# Patient Record
Sex: Female | Born: 1935 | Race: White | Hispanic: No | Marital: Married | Smoking: Former smoker
Health system: Southern US, Community
[De-identification: ages and names within clinical notes are randomized; demographics above are authoritative.]

## PROBLEM LIST (undated history)

## (undated) DIAGNOSIS — D4989 Neoplasm of unspecified behavior of other specified sites: Secondary | ICD-10-CM

## (undated) DIAGNOSIS — S32000A Wedge compression fracture of unspecified lumbar vertebra, initial encounter for closed fracture: Secondary | ICD-10-CM

## (undated) DIAGNOSIS — K449 Diaphragmatic hernia without obstruction or gangrene: Secondary | ICD-10-CM

## (undated) DIAGNOSIS — C4490 Unspecified malignant neoplasm of skin, unspecified: Secondary | ICD-10-CM

## (undated) DIAGNOSIS — I340 Nonrheumatic mitral (valve) insufficiency: Secondary | ICD-10-CM

## (undated) DIAGNOSIS — I2510 Atherosclerotic heart disease of native coronary artery without angina pectoris: Secondary | ICD-10-CM

## (undated) DIAGNOSIS — I209 Angina pectoris, unspecified: Secondary | ICD-10-CM

## (undated) DIAGNOSIS — I493 Ventricular premature depolarization: Secondary | ICD-10-CM

## (undated) DIAGNOSIS — I10 Essential (primary) hypertension: Secondary | ICD-10-CM

## (undated) DIAGNOSIS — F0390 Unspecified dementia without behavioral disturbance: Secondary | ICD-10-CM

## (undated) DIAGNOSIS — Z951 Presence of aortocoronary bypass graft: Secondary | ICD-10-CM

## (undated) DIAGNOSIS — R011 Cardiac murmur, unspecified: Secondary | ICD-10-CM

## (undated) DIAGNOSIS — R0602 Shortness of breath: Secondary | ICD-10-CM

## (undated) DIAGNOSIS — Z9889 Other specified postprocedural states: Secondary | ICD-10-CM

## (undated) DIAGNOSIS — I509 Heart failure, unspecified: Secondary | ICD-10-CM

## (undated) DIAGNOSIS — I719 Aortic aneurysm of unspecified site, without rupture: Secondary | ICD-10-CM

## (undated) DIAGNOSIS — E441 Mild protein-calorie malnutrition: Secondary | ICD-10-CM

## (undated) DIAGNOSIS — I341 Nonrheumatic mitral (valve) prolapse: Secondary | ICD-10-CM

## (undated) DIAGNOSIS — I700 Atherosclerosis of aorta: Secondary | ICD-10-CM

## (undated) DIAGNOSIS — S22000A Wedge compression fracture of unspecified thoracic vertebra, initial encounter for closed fracture: Secondary | ICD-10-CM

## (undated) HISTORY — DX: Atherosclerosis of aorta: I70.0

## (undated) HISTORY — DX: Atherosclerotic heart disease of native coronary artery without angina pectoris: I25.10

## (undated) HISTORY — DX: Unspecified malignant neoplasm of skin, unspecified: C44.90

## (undated) HISTORY — DX: Mild protein-calorie malnutrition: E44.1

## (undated) HISTORY — DX: Neoplasm of unspecified behavior of other specified sites: D49.89

## (undated) HISTORY — DX: Wedge compression fracture of unspecified thoracic vertebra, initial encounter for closed fracture: S22.000A

## (undated) HISTORY — DX: Heart failure, unspecified: I50.9

## (undated) HISTORY — PX: HEMORROIDECTOMY: SUR656

## (undated) HISTORY — PX: HEMORRHOID SURGERY: SHX153

## (undated) HISTORY — PX: OTHER SURGICAL HISTORY: SHX169

## (undated) HISTORY — DX: Presence of aortocoronary bypass graft: Z95.1

## (undated) HISTORY — PX: BACK SURGERY: SHX140

## (undated) HISTORY — DX: Nonrheumatic mitral (valve) prolapse: I34.1

## (undated) HISTORY — PX: ABDOMINAL HYSTERECTOMY: SHX81

## (undated) HISTORY — PX: TOTAL HIP ARTHROPLASTY: SHX124

## (undated) HISTORY — DX: Nonrheumatic mitral (valve) insufficiency: I34.0

---

## 1997-08-27 ENCOUNTER — Other Ambulatory Visit: Admission: RE | Admit: 1997-08-27 | Discharge: 1997-08-27 | Payer: Self-pay | Admitting: Gynecology

## 1999-02-09 ENCOUNTER — Encounter: Payer: Self-pay | Admitting: Gynecology

## 1999-02-09 ENCOUNTER — Encounter: Admission: RE | Admit: 1999-02-09 | Discharge: 1999-02-09 | Payer: Self-pay | Admitting: Gynecology

## 1999-03-30 ENCOUNTER — Other Ambulatory Visit: Admission: RE | Admit: 1999-03-30 | Discharge: 1999-03-30 | Payer: Self-pay | Admitting: Gynecology

## 2001-10-25 ENCOUNTER — Encounter: Payer: Self-pay | Admitting: Gynecology

## 2001-10-25 ENCOUNTER — Encounter: Admission: RE | Admit: 2001-10-25 | Discharge: 2001-10-25 | Payer: Self-pay | Admitting: Gynecology

## 2001-10-31 ENCOUNTER — Other Ambulatory Visit: Admission: RE | Admit: 2001-10-31 | Discharge: 2001-10-31 | Payer: Self-pay | Admitting: Gynecology

## 2002-01-02 ENCOUNTER — Emergency Department (HOSPITAL_COMMUNITY): Admission: EM | Admit: 2002-01-02 | Discharge: 2002-01-02 | Payer: Self-pay | Admitting: Emergency Medicine

## 2002-01-02 ENCOUNTER — Encounter: Payer: Self-pay | Admitting: Emergency Medicine

## 2002-08-03 ENCOUNTER — Encounter: Payer: Self-pay | Admitting: Family Medicine

## 2002-08-03 ENCOUNTER — Ambulatory Visit (HOSPITAL_COMMUNITY): Admission: RE | Admit: 2002-08-03 | Discharge: 2002-08-03 | Payer: Self-pay | Admitting: Family Medicine

## 2002-08-13 ENCOUNTER — Ambulatory Visit (HOSPITAL_COMMUNITY): Admission: RE | Admit: 2002-08-13 | Discharge: 2002-08-13 | Payer: Self-pay | Admitting: Family Medicine

## 2002-08-13 ENCOUNTER — Encounter: Payer: Self-pay | Admitting: Family Medicine

## 2003-09-04 ENCOUNTER — Ambulatory Visit: Admission: RE | Admit: 2003-09-04 | Discharge: 2003-09-04 | Payer: Self-pay | Admitting: Family Medicine

## 2006-10-02 ENCOUNTER — Emergency Department (HOSPITAL_COMMUNITY): Admission: EM | Admit: 2006-10-02 | Discharge: 2006-10-02 | Payer: Self-pay | Admitting: Emergency Medicine

## 2007-05-18 ENCOUNTER — Encounter: Admission: RE | Admit: 2007-05-18 | Discharge: 2007-05-18 | Payer: Self-pay | Admitting: Gynecology

## 2010-04-22 ENCOUNTER — Emergency Department (HOSPITAL_COMMUNITY)
Admission: EM | Admit: 2010-04-22 | Discharge: 2010-04-22 | Disposition: A | Discharge status: Home / Self Care | Payer: Medicare Other | Attending: Emergency Medicine | Admitting: Emergency Medicine

## 2010-04-22 DIAGNOSIS — I10 Essential (primary) hypertension: Secondary | ICD-10-CM | POA: Insufficient documentation

## 2010-04-22 DIAGNOSIS — K4090 Unilateral inguinal hernia, without obstruction or gangrene, not specified as recurrent: Secondary | ICD-10-CM | POA: Insufficient documentation

## 2010-09-01 ENCOUNTER — Emergency Department (HOSPITAL_COMMUNITY)
Admission: EM | Admit: 2010-09-01 | Discharge: 2010-09-01 | Disposition: A | Discharge status: Home / Self Care | Payer: Medicare Other | Attending: Emergency Medicine | Admitting: Emergency Medicine

## 2010-09-01 ENCOUNTER — Emergency Department (HOSPITAL_COMMUNITY): Payer: Medicare Other

## 2010-09-01 DIAGNOSIS — W1809XA Striking against other object with subsequent fall, initial encounter: Secondary | ICD-10-CM | POA: Insufficient documentation

## 2010-09-01 DIAGNOSIS — I10 Essential (primary) hypertension: Secondary | ICD-10-CM | POA: Insufficient documentation

## 2010-09-01 DIAGNOSIS — R0602 Shortness of breath: Secondary | ICD-10-CM | POA: Insufficient documentation

## 2010-09-01 DIAGNOSIS — S298XXA Other specified injuries of thorax, initial encounter: Secondary | ICD-10-CM | POA: Insufficient documentation

## 2010-09-01 DIAGNOSIS — K449 Diaphragmatic hernia without obstruction or gangrene: Secondary | ICD-10-CM | POA: Insufficient documentation

## 2010-09-01 DIAGNOSIS — R079 Chest pain, unspecified: Secondary | ICD-10-CM | POA: Insufficient documentation

## 2010-10-25 ENCOUNTER — Emergency Department (HOSPITAL_COMMUNITY)
Admission: EM | Admit: 2010-10-25 | Discharge: 2010-10-25 | Disposition: A | Discharge status: Home / Self Care | Payer: Medicare Other | Attending: Emergency Medicine | Admitting: Emergency Medicine

## 2010-10-25 ENCOUNTER — Emergency Department (HOSPITAL_COMMUNITY)
Admission: EM | Admit: 2010-10-25 | Discharge: 2010-10-26 | Disposition: A | Discharge status: Home / Self Care | Payer: Medicare Other | Attending: Emergency Medicine | Admitting: Emergency Medicine

## 2010-10-25 ENCOUNTER — Emergency Department (HOSPITAL_COMMUNITY): Payer: Medicare Other

## 2010-10-25 DIAGNOSIS — K449 Diaphragmatic hernia without obstruction or gangrene: Secondary | ICD-10-CM | POA: Insufficient documentation

## 2010-10-25 DIAGNOSIS — J449 Chronic obstructive pulmonary disease, unspecified: Secondary | ICD-10-CM | POA: Insufficient documentation

## 2010-10-25 DIAGNOSIS — IMO0001 Reserved for inherently not codable concepts without codable children: Secondary | ICD-10-CM | POA: Insufficient documentation

## 2010-10-25 DIAGNOSIS — Y92009 Unspecified place in unspecified non-institutional (private) residence as the place of occurrence of the external cause: Secondary | ICD-10-CM | POA: Insufficient documentation

## 2010-10-25 DIAGNOSIS — I059 Rheumatic mitral valve disease, unspecified: Secondary | ICD-10-CM | POA: Insufficient documentation

## 2010-10-25 DIAGNOSIS — M549 Dorsalgia, unspecified: Secondary | ICD-10-CM | POA: Insufficient documentation

## 2010-10-25 DIAGNOSIS — X500XXA Overexertion from strenuous movement or load, initial encounter: Secondary | ICD-10-CM | POA: Insufficient documentation

## 2010-10-25 DIAGNOSIS — Y998 Other external cause status: Secondary | ICD-10-CM | POA: Insufficient documentation

## 2010-10-25 DIAGNOSIS — IMO0002 Reserved for concepts with insufficient information to code with codable children: Secondary | ICD-10-CM | POA: Insufficient documentation

## 2010-10-25 DIAGNOSIS — J4489 Other specified chronic obstructive pulmonary disease: Secondary | ICD-10-CM | POA: Insufficient documentation

## 2010-10-25 DIAGNOSIS — R059 Cough, unspecified: Secondary | ICD-10-CM | POA: Insufficient documentation

## 2010-10-25 DIAGNOSIS — I10 Essential (primary) hypertension: Secondary | ICD-10-CM | POA: Insufficient documentation

## 2010-10-25 DIAGNOSIS — Z79899 Other long term (current) drug therapy: Secondary | ICD-10-CM | POA: Insufficient documentation

## 2010-10-25 DIAGNOSIS — R05 Cough: Secondary | ICD-10-CM | POA: Insufficient documentation

## 2010-10-25 LAB — DIFFERENTIAL
Eosinophils Absolute: 0.1 10*3/uL (ref 0.0–0.7)
Eosinophils Relative: 1 % (ref 0–5)
Lymphs Abs: 4.2 10*3/uL — ABNORMAL HIGH (ref 0.7–4.0)
Monocytes Absolute: 0.5 10*3/uL (ref 0.1–1.0)
Monocytes Relative: 6 % (ref 3–12)

## 2010-10-25 LAB — CBC
MCH: 30.2 pg (ref 26.0–34.0)
MCHC: 33.6 g/dL (ref 30.0–36.0)
MCV: 89.8 fL (ref 78.0–100.0)
Platelets: 222 10*3/uL (ref 150–400)
RDW: 13.8 % (ref 11.5–15.5)

## 2010-10-25 LAB — BASIC METABOLIC PANEL
CO2: 27 mEq/L (ref 19–32)
GFR calc non Af Amer: 86 mL/min — ABNORMAL LOW (ref >90)
Glucose, Bld: 96 mg/dL (ref 70–99)
Potassium: 3.4 mEq/L — ABNORMAL LOW (ref 3.5–5.1)
Sodium: 137 mEq/L (ref 135–145)

## 2010-10-25 LAB — POCT I-STAT TROPONIN I: Troponin i, poc: 0.01 ng/mL (ref 0.00–0.08)

## 2010-10-25 LAB — CK TOTAL AND CKMB (NOT AT ARMC): Relative Index: INVALID (ref 0.0–2.5)

## 2010-10-26 ENCOUNTER — Emergency Department (HOSPITAL_COMMUNITY): Payer: Medicare Other

## 2010-10-26 LAB — URINALYSIS, ROUTINE W REFLEX MICROSCOPIC
Hgb urine dipstick: NEGATIVE
Leukocytes, UA: NEGATIVE
Protein, ur: NEGATIVE mg/dL
Specific Gravity, Urine: 1.005 (ref 1.005–1.030)
Urobilinogen, UA: 0.2 mg/dL (ref 0.0–1.0)

## 2010-10-26 LAB — BASIC METABOLIC PANEL
Calcium: 10.8 mg/dL — ABNORMAL HIGH (ref 8.4–10.5)
GFR calc Af Amer: >90 mL/min (ref >90)
GFR calc non Af Amer: 86 mL/min — ABNORMAL LOW (ref >90)
Glucose, Bld: 114 mg/dL — ABNORMAL HIGH (ref 70–99)
Potassium: 3.8 mEq/L (ref 3.5–5.1)
Sodium: 137 mEq/L (ref 135–145)

## 2010-10-26 LAB — CBC
HCT: 39 % (ref 36.0–46.0)
Hemoglobin: 12.8 g/dL (ref 12.0–15.0)
RDW: 13.8 % (ref 11.5–15.5)
WBC: 11.3 10*3/uL — ABNORMAL HIGH (ref 4.0–10.5)

## 2010-11-12 ENCOUNTER — Other Ambulatory Visit: Payer: Self-pay | Admitting: Cardiology

## 2010-11-12 DIAGNOSIS — I340 Nonrheumatic mitral (valve) insufficiency: Secondary | ICD-10-CM

## 2010-11-12 DIAGNOSIS — I10 Essential (primary) hypertension: Secondary | ICD-10-CM | POA: Insufficient documentation

## 2010-11-12 NOTE — H&P (Signed)
CC:    1. REFERRED DR CYNTHIA WHITE EVALUATE SEVERE MITRAL REGURG .        HPI:  General:  77 year old here for evaluation of mobile mass on LVOT as well as severe MR. Has muscle spams in back. Went to [facility] ED for that. Taking Vicodin for this. No SOB, unless running up stairs. No chest pain. No prior stroke. Unexplained weight loss. Trying ensure.   1. Left ventricular ejection fraction estimated by 2D at 60-65 percent. 2. 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 3. Moderate mitral annular calcification. 4. Severe mitral valve regurgitation. 5. Moderate posterior mitral valve leaflet prolapse. 6. Mild aortic valve stenosis. 7. Atrial septal aneurysm. 8. Consider transesophageal echo to further evaluate LVOT density..        ROS:  No syncope, no CP, no SOB unless heavy exertion. , The other elements of the review of systems are negative (12 total elements).       Medical History: Hypertension, Tob use, PVCs, MVP 3/04, Osteoporosis, R inguinal hernia--Dr Freida Busman, hiatal hernia (on CXR).        Gyn History:        OB History:        Family History: Father: deceased enlarged heart Mother: deceased rheumatoid arthritis        Social History:  General:  History of smoking  cigarettes: Current smoker Frequency: intermittent smoker Estimated Pack-years: 30 Smoking: yes.  no Alcohol.  no Recreational drug use.  Exercise: bowl once per week.  Marital Status: Married Ed.  Children: none.  Religion: [facility].        Medications: Metoprolol Succinate 50 MG Tablet Extended Release 24 Hour 1 tablet Once a day, Amlodipine Besylate 5 MG Tablet 1 tablet Once a day, Calcium 150 MG Tablet as directed daily, Co Q 10 10 MG Capsule as directed daily, Red Yeast Rice 600 MG Capsule as directed daily, Aspirin 81 MG Tablet 1 tablet Once a day, Vitamin D 400 MG Tablet 1 tablet Once a day, Lutein Capsule as directed daily, ProAir  HFA 108 (90 Base) MCG/ACT Aerosol Solution 2 puffs as needed every 4 hrs, Flexeril 10 MG Tablet 1 tablet Three times a day as needed, stop date 11/03/2010, Medication List reviewed and reconciled with the patient       Allergies: Penicillin (for allergy): headache, n/v, Sulfa drugs (for allergy).       Objective:     Vitals: Wt 96, Wt change .8 lb, Ht 61, BMI 18.14, Pulse sitting 92, BP sitting 160/70.       Examination:  General Examination:  GENERAL APPEARANCE alert, oriented, NAD, pleasant, elderly-appearing, thin.  SKIN: normal, no rash.  HEENT: normal.  HEAD: [place]/AT.  ORAL CAVITY: moist mucous membranes, no significant abnormalities.  NECK: no lymphadenopathy.  LUNGS: clear to auscultation bilaterally, no wheezes, rhonchi, rales.  HEART: Regular rate and rhythm, 3/6 holosystolic murmur at apex.  ABDOMEN: no hepatosplenomegaly, no masses palpated, soft and not tender.  BACK: Kyphosis of upper back.  EXTREMITIES: no clubbing, no edema.  PERIPHERAL PULSES: normal (2+) bilaterally.  Lab work pending from October 06, 2009 normal creatinine of 0.6. Echocardiogram personally reviewed, and images reviewed prior medical records reviewed. EKG reviewed.       Assessment:     Assessment:  1. MVP (mitral valve prolapse) - 424.0 (Primary)  2. Observation for suspected cardiovascular disease - V71.7    Plan:  1. MVP (mitral valve prolapse)  Echocardiogram demonstrates severe mitral regurgitation and moderate mitral valve prolapse. She admits to shortness of breath only with "running up stairs ". There is a possible mass-like object that is mobile in the left ventricular outflow tract. In order to fully evaluate both entities, I recommend transesophageal echocardiogram. She states that she does not have any difficulty when swallowing food, steak or chicken. Occasionally a large pill will get "stuck". I reviewed the risks and benefits of the transesophageal echocardiogram. Risk of  bleeding or damage the esophagus is very low. If difficulty is encountered, procedure will be terminated. My goal is to further evaluate both lesions and then if necessary have her speak with one of our surgeons for the possibility of mitral valve repair and/or removal of outflow tract mass. We did discuss that with her advanced age, weight loss, that she would be at an increased risk for surgery.       2. Observation for suspected cardiovascular disease  Diagnostic Imaging:EKG ectopic atrial rhythm, nonspecific ST changes, Sexton,Victoria 10/30/2010 10:59:48 AM > Victoria Sexton 10/30/2010 11:41:42 AM >        Immunizations:        Labs:        Procedure Codes: 45409 EKG I AND R       Preventive:   Cc: Dr. Cliffton Asters.       Follow Up: I will followup with TEE      Provider: Donato Schultz, MD  Patient: Victoria, Sexton DOB: 1927-11-23 Date: 10/30/2010

## 2010-11-13 ENCOUNTER — Encounter (HOSPITAL_COMMUNITY)
Admission: RE | Disposition: A | Discharge status: Home / Self Care | Payer: Self-pay | Source: Ambulatory Visit | Attending: Cardiology

## 2010-11-13 ENCOUNTER — Other Ambulatory Visit: Payer: Self-pay | Admitting: Cardiology

## 2010-11-13 ENCOUNTER — Ambulatory Visit (HOSPITAL_COMMUNITY)
Admission: RE | Admit: 2010-11-13 | Discharge: 2010-11-13 | Disposition: A | Discharge status: Home / Self Care | Payer: Medicare Other | Source: Ambulatory Visit | Attending: Cardiology | Admitting: Cardiology

## 2010-11-13 ENCOUNTER — Encounter (HOSPITAL_COMMUNITY): Payer: Self-pay

## 2010-11-13 DIAGNOSIS — I059 Rheumatic mitral valve disease, unspecified: Secondary | ICD-10-CM | POA: Insufficient documentation

## 2010-11-13 DIAGNOSIS — I340 Nonrheumatic mitral (valve) insufficiency: Secondary | ICD-10-CM

## 2010-11-13 HISTORY — DX: Nonrheumatic mitral (valve) prolapse: I34.1

## 2010-11-13 HISTORY — DX: Essential (primary) hypertension: I10

## 2010-11-13 HISTORY — DX: Diaphragmatic hernia without obstruction or gangrene: K44.9

## 2010-11-13 HISTORY — DX: Ventricular premature depolarization: I49.3

## 2010-11-13 HISTORY — PX: TEE WITHOUT CARDIOVERSION: SHX5443

## 2010-11-13 SURGERY — ECHOCARDIOGRAM, TRANSESOPHAGEAL
Anesthesia: Moderate Sedation

## 2010-11-13 MED ORDER — LIDOCAINE VISCOUS 2 % MT SOLN
OROMUCOSAL | Status: DC | PRN
  Administered 2010-11-13: 10 mL via OROMUCOSAL

## 2010-11-13 MED ORDER — MIDAZOLAM HCL 10 MG/2ML IJ SOLN
INTRAMUSCULAR | Status: DC | PRN
  Administered 2010-11-13: 2 mg via INTRAVENOUS
  Administered 2010-11-13: 1 mg via INTRAVENOUS

## 2010-11-13 MED ORDER — SODIUM CHLORIDE 0.9 % IV SOLN: 250.0000 mL | INTRAVENOUS | Status: DC

## 2010-11-13 MED ORDER — FENTANYL CITRATE 0.05 MG/ML IJ SOLN
INTRAMUSCULAR | Status: AC
  Filled 2010-11-13: qty 2

## 2010-11-13 MED ORDER — FENTANYL CITRATE 0.05 MG/ML IJ SOLN: 250.0000 ug | Freq: Once | INTRAMUSCULAR | Status: DC

## 2010-11-13 MED ORDER — FENTANYL CITRATE 0.05 MG/ML IJ SOLN
INTRAMUSCULAR | Status: DC | PRN
  Administered 2010-11-13: 25 ug via INTRAVENOUS
  Administered 2010-11-13: 10 ug via INTRAVENOUS
  Administered 2010-11-13: 25 ug via INTRAVENOUS

## 2010-11-13 MED ORDER — SODIUM CHLORIDE 0.9 % IJ SOLN: 3.0000 mL | INTRAMUSCULAR | Status: DC | PRN

## 2010-11-13 MED ORDER — BENZOCAINE 20 % MT SOLN: 1.0000 "application " | OROMUCOSAL | Status: DC | PRN

## 2010-11-13 MED ORDER — MIDAZOLAM HCL 10 MG/2ML IJ SOLN: 10.0000 mg | Freq: Once | INTRAMUSCULAR | Status: DC

## 2010-11-13 MED ORDER — MIDAZOLAM HCL 10 MG/2ML IJ SOLN
INTRAMUSCULAR | Status: AC
  Filled 2010-11-13: qty 2

## 2010-11-13 MED ORDER — SODIUM CHLORIDE 0.45 % IV SOLN: INTRAVENOUS | Status: DC

## 2010-11-13 MED ORDER — LIDOCAINE VISCOUS 2 % MT SOLN
OROMUCOSAL | Status: AC
  Filled 2010-11-13: qty 15

## 2010-11-13 NOTE — Progress Notes (Signed)
*  PRELIMINARY RESULTS* Echocardiogram Echocardiogram Transesophageal has been performed.  Glean Salen Antonio RDCS 11/13/2010, 1:55 PM

## 2010-11-13 NOTE — Brief Op Note (Signed)
11/13/2010  1:23 PM  PATIENT:  Victoria Sexton  77 y.o. female  PRE-OPERATIVE DIAGNOSIS:  eval mitral valve  POST-OPERATIVE DIAGNOSIS:  * No post-op diagnosis entered *  PROCEDURE:  Procedure(s): TRANSESOPHAGEAL ECHOCARDIOGRAM (TEE)  SURGEON:  Surgeon(s): Donato Schultz, MD  TEE performed  Normal EF Severe eccentric MR, with MVP posterior leaflet. Anterior directed jet. 0.5cm mobile echodensity in LVOT noted. ?fibroelastoma  Difficult images.   Will discuss with TCTS

## 2010-11-13 NOTE — Interval H&P Note (Signed)
History and Physical Interval Note:   11/13/2010   12:52 PM   Victoria Sexton  has presented today for surgery, with the diagnosis of eval mitral valve  The various methods of treatment have been discussed with the patient and family. After consideration of risks, benefits and other options for treatment, the patient has consented to  Procedure(s): TRANSESOPHAGEAL ECHOCARDIOGRAM (TEE) as a surgical intervention .  The patients' history has been reviewed, patient examined, no change in status, stable for surgery.  I have reviewed the patients' chart and labs.  Questions were answered to the patient's satisfaction.     Donato Schultz  MD Date of Initial H&P: 10/30/10  History reviewed, patient examined, no change in status, stable for surgery.

## 2010-11-16 ENCOUNTER — Encounter (HOSPITAL_COMMUNITY): Payer: Self-pay

## 2010-11-20 ENCOUNTER — Encounter: Payer: Self-pay | Admitting: *Deleted

## 2010-11-23 ENCOUNTER — Encounter: Payer: Self-pay | Admitting: Thoracic Surgery (Cardiothoracic Vascular Surgery)

## 2010-11-23 ENCOUNTER — Institutional Professional Consult (permissible substitution) (INDEPENDENT_AMBULATORY_CARE_PROVIDER_SITE_OTHER): Payer: Medicare Other | Admitting: Thoracic Surgery (Cardiothoracic Vascular Surgery)

## 2010-11-23 VITALS — BP 150/72 | HR 70 | Resp 16 | Ht 60.0 in | Wt 99.0 lb

## 2010-11-23 DIAGNOSIS — I059 Rheumatic mitral valve disease, unspecified: Secondary | ICD-10-CM

## 2010-11-23 DIAGNOSIS — D4989 Neoplasm of unspecified behavior of other specified sites: Secondary | ICD-10-CM

## 2010-11-23 DIAGNOSIS — I340 Nonrheumatic mitral (valve) insufficiency: Secondary | ICD-10-CM | POA: Insufficient documentation

## 2010-11-23 DIAGNOSIS — I359 Nonrheumatic aortic valve disorder, unspecified: Secondary | ICD-10-CM

## 2010-11-23 NOTE — Progress Notes (Signed)
[address]            [place] [address]          [phone]     CARDIOTHORACIC SURGERY CONSULTATION REPORT  PCP is Cala Bradford, MD Referring Provider is Donato Schultz, MD  Chief Complaint  Patient presents with  . Mitral Regurgitation    eval and treat    HPI:  Patient is an 77 year old married white female from [place] with no previous cardiac history who was recently discovered to have a murmur on physical exam. Echocardiogram revealed mitral valve prolapse with severe mitral regurgitation. The patient was referred to Dr. Anne Fu and underwent transesophageal echocardiogram confirming the presence of mitral valve prolapse with severe mitral regurgitation. 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 The patient has been referred to consider elective surgical intervention. Of note, the patient suffered a fall just over 2 months ago and within the last few weeks has been diagnosed with herniated disc in her back. Details are unclear but the patient is being treated conservatively with 3 months in a back brace.  Past Medical History  Diagnosis Date  . Hypertension   . PVC (premature ventricular contraction)   . MVP (mitral valve prolapse)   . Osteoporosis   . Inguinal hernia right  . Hiatal hernia   . Mitral regurgitation due to cusp prolapse   . Cardiac tumor, ventricular     LVOT    Past Surgical History  Procedure Date  . Abdominal hysterectomy   . Leg fx repair   . Hemorroidectomy     History reviewed. No pertinent family history.  Social History History  Substance Use Topics  . Smoking status: Current Everyday Smoker -- 0.5 packs/day for 30 years    Types: Cigarettes  . Smokeless tobacco: Not on file  . Alcohol Use: No    Current Outpatient Prescriptions  Medication Sig  Dispense Refill  . calcium gluconate 500 MG tablet Take 500 mg by mouth daily.        . cholecalciferol (VITAMIN D) 400 UNITS TABS Take by mouth.        . co-enzyme Q-10 30 MG capsule Take 30 mg by mouth 3 (three) times daily.        . multivitamin (THERAGRAN) per tablet Take 1 tablet by mouth daily.        . multivitamin-lutein (OCUVITE-LUTEIN) CAPS Take 1 capsule by mouth daily.        . Red Yeast Rice 600 MG CAPS Take by mouth.        [name] amLODipine (NORVASC) 5 MG tablet Take 5 mg by mouth daily.        [name] aspirin 81 MG tablet Take 81 mg by mouth daily.        . metoprolol (LOPRESSOR) 50 MG tablet Take 50 mg by mouth daily.         Allergies  Allergen Reactions  . Penicillins Palpitations  . Sulfa Antibiotics Rash    Review of Systems:  General:  normal appetite, increased fatigue, 8-10 lbs weight loss over last 6 months  Respiratory:  no cough, no wheezing, no hemoptysis, no pain with inspiration or cough, no shortness of breath except with relatively strenuous activity  Cardiac:   no chest pain or tightness, very  mild exertional SOB, no resting SOB, no PND, no orthopnea, no LE edema, no palpitations, no syncope  GI:   no difficulty swallowing, occasional hematochezia, no hematemesis, no melena, no constipation, no diarrhea, patient does report early satiety  GU:   no dysuria, no urgency, no frequency   Musculoskeletal: Pain in back for several weeks without radiation, improved over last 2 weeks  Vascular:  no pain suggestive of claudication   Neuro:   no symptoms suggestive of TIA's, no seizures, no headaches, no peripheral neuropathy   Endocrine:  Negative   HEENT:  no loose teeth or painful teeth,  Occasional double vision attributed to cornea R eye  Psych:   no anxiety, no depression    Physical Exam:   BP 150/72  Pulse 70  Resp 16  Ht 5' (1.524 m)  Wt 99 lb (44.906 kg)  BMI 19.33 kg/m2  SpO2 97%  General:  Thin, frail but otherwise well-appearing  HEENT:  Unremarkable     Neck:   no JVD, no bruits, no adenopathy   Chest:   clear to auscultation, symmetrical breath sounds, no wheezes, no rhonchi   CV:   RRR, loud IV/VI holosystolic murmur   Abdomen:  soft, non-tender, no masses   Extremities:  warm, well-perfused, pulses palpable, moderate kyphoscoliosis  Rectal/GU  Deferred  Neuro:   Grossly non-focal and symmetrical throughout  Skin:   Clean and dry, no rashes, no breakdown  Diagnostic Tests:  TEE performed 11/13/2010 is reviewed.  There is mitral valve prolapse involving the middle scallop of the posterior leaflet of the mitral valve with severe mitral regurgitation. The jet of regurgitation is eccentric and courses anteriorly around the left atrium. A flail segment of the posterior leaflet is quite tall. Anterior leaflet appears normal. Left ventricular size and systolic function appears normal. There is a very mobile density that appears to be adherent to the interventricular septum in the left ventricular output tract. 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  No other significant abnormalities are noted.  Impression:  Mitral valve prolapse with flail segment of the posterior leaflet of the mitral valve and severe mitral regurgitation. The patient claims to be nearly asymptomatic, although she does admit she gets tired easily and she will get short of breath if she hurries while she is doing something. Her physical activity is limited at baseline. She also has a very mobile density in the left ventricular output tract suggestive of papillary fibroelastoma.  Risks associated with elective mitral valve repair and resection of this mass should be fairly low, but given the patient's advanced age the risks are certainly not negligible. Most concerning is the appearance of the mobile mass in the left ventricular tract do to concerns regarding the possibility for embolization. Finally, the patient apparently has a herniated disc in her lower back which  presumably occurred recently and is being treated conservatively with nonsurgical treatment.  Plan:  I discussed matters at length with the patient and her husband here in the office today. The rationale for elective surgical treatment of her mitral valve and intracardiac tumor at length. If she is ever going to consider surgical intervention it would be best to proceed with surgery before she develops some type of complication related to either of these problems. Most concerning is the risk of stroke do to the intracardiac tumor. On the other hand, given her advanced age conservative treatment with followup is always a reasonable option, particularly at the present time given the fact that  the patient is recovering from her recent trauma to her back.  The patient is very hesitant to consider surgery at all. Under the circumstances it it seems most reasonable to let her recover from her herniated disc for another month or 2 and let her think about whether or not she would like to consider elective mitral valve repair and resection of the cardiac mass. She would need diagnostic cardiac catheterization at some point to make certain that she doesn't have significant coronary artery disease. All of their questions been addressed. We will plan to see the patient back in 2 months.    Salvatore Decent. Cornelius Moras, MD

## 2010-11-27 ENCOUNTER — Encounter (HOSPITAL_COMMUNITY): Payer: Self-pay | Admitting: Cardiology

## 2011-01-05 DIAGNOSIS — C4490 Unspecified malignant neoplasm of skin, unspecified: Secondary | ICD-10-CM

## 2011-01-05 HISTORY — PX: SKIN CANCER EXCISION: SHX779

## 2011-01-05 HISTORY — DX: Unspecified malignant neoplasm of skin, unspecified: C44.90

## 2011-01-18 ENCOUNTER — Ambulatory Visit: Payer: Medicare Other | Admitting: Thoracic Surgery (Cardiothoracic Vascular Surgery)

## 2011-01-20 DIAGNOSIS — I10 Essential (primary) hypertension: Secondary | ICD-10-CM | POA: Diagnosis not present

## 2011-01-20 DIAGNOSIS — R222 Localized swelling, mass and lump, trunk: Secondary | ICD-10-CM | POA: Diagnosis not present

## 2011-01-20 DIAGNOSIS — E876 Hypokalemia: Secondary | ICD-10-CM | POA: Diagnosis not present

## 2011-02-08 ENCOUNTER — Encounter: Payer: Self-pay | Admitting: Thoracic Surgery (Cardiothoracic Vascular Surgery)

## 2011-02-08 ENCOUNTER — Encounter: Payer: Self-pay | Admitting: *Deleted

## 2011-02-08 ENCOUNTER — Ambulatory Visit (INDEPENDENT_AMBULATORY_CARE_PROVIDER_SITE_OTHER): Payer: Medicare Other | Admitting: Thoracic Surgery (Cardiothoracic Vascular Surgery)

## 2011-02-08 VITALS — BP 144/79 | HR 88 | Resp 16 | Ht 64.0 in | Wt 101.0 lb

## 2011-02-08 DIAGNOSIS — I359 Nonrheumatic aortic valve disorder, unspecified: Secondary | ICD-10-CM

## 2011-02-08 DIAGNOSIS — D4989 Neoplasm of unspecified behavior of other specified sites: Secondary | ICD-10-CM | POA: Diagnosis not present

## 2011-02-08 DIAGNOSIS — I059 Rheumatic mitral valve disease, unspecified: Secondary | ICD-10-CM | POA: Diagnosis not present

## 2011-02-08 NOTE — Progress Notes (Signed)
[address]            [place] [address]          [phone]     CARDIOTHORACIC SURGERY OFFICE NOTE  PCP is Cala Bradford, MD, MD Referring Provider is Donato Schultz, MD  Chief Complaint  Patient presents with  . 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  She was originally seen in consultation on November 19. Since then her back has improved considerably and she is now no longer walk wearing a back brace. She also stopped smoking and she reports that her breathing has improved considerably. Her chronic cough has resolved. Overall she reports feeling quite well. She has very mild symptoms of exertional shortness of breath that she states only occurs if she really pushes herself physically. She denies resting shortness of breath, PND, orthopnea, chest pain, dizzy spells, syncope. She has had some mild bilateral lower extremity edema. Overall she reports feeling quite well.  Past Medical History  Diagnosis Date  . Hypertension   . PVC (premature ventricular contraction)   . MVP (mitral valve prolapse)   . Osteoporosis   . Inguinal hernia right  . Hiatal hernia   . Mitral regurgitation due to cusp prolapse   . Cardiac tumor, ventricular     LVOT    Past Surgical History  Procedure Date  . Abdominal hysterectomy   . Leg fx repair   . Hemorroidectomy   . Tee without cardioversion 11/13/2010    Procedure: TRANSESOPHAGEAL ECHOCARDIOGRAM (TEE);  Surgeon: Donato Schultz, MD;  Location: [facility] ENDOSCOPY;  Service: Cardiovascular;  Laterality: N/A;    No family history on file.  Social History History  Substance Use Topics  . Smoking status: Former Smoker -- 0.5 packs/day for 30 years    Types: Cigarettes    Quit date: 11/13/2010  . Smokeless tobacco: Not on file  . Alcohol Use: No    Current  Outpatient Prescriptions  Medication Sig Dispense Refill  . amLODipine (NORVASC) 5 MG tablet Take 5 mg by mouth daily.        [name] aspirin 81 MG tablet Take 81 mg by mouth daily.        . calcium gluconate 500 MG tablet Take 500 mg by mouth daily.        . cholecalciferol (VITAMIN D) 400 UNITS TABS Take by mouth.        . co-enzyme Q-10 30 MG capsule Take 30 mg by mouth daily.       . metoprolol (LOPRESSOR) 50 MG tablet Take 50 mg by mouth daily.       . multivitamin (THERAGRAN) per tablet Take 1 tablet by mouth daily.        . multivitamin-lutein (OCUVITE-LUTEIN) CAPS Take 1 capsule by mouth daily.        [name] lactobacillus acidophilus (BACID) TABS Take 1 tablet by mouth 1 day or 1 dose.      . Red Yeast Rice 600 MG CAPS Take by mouth.          Allergies  Allergen Reactions  . Penicillins Palpitations  . Sulfa Antibiotics Rash    Review of Systems:  General:  normal appetite, normal energy   Respiratory:  no cough, no wheezing, no  hemoptysis, no pain with inspiration or cough, no shortness of breath   Cardiac:   no chest pain or tightness, + exertional SOB only if she pushes herself physically, no resting SOB, no PND, no orthopnea, + mild bilateral LE edema, no palpitations, no syncope  GI:   no difficulty swallowing, no hematochezia, no hematemesis, no melena, no constipation, no diarrhea   GU:   no dysuria, no urgency, no frequency   Musculoskeletal: Back pain much improved, ambulating fairly well  Vascular:  no pain suggestive of claudication   Neuro:   no symptoms suggestive of TIA's, no seizures, no headaches, no peripheral neuropathy   Endocrine:  Negative   HEENT:  no loose teeth or painful teeth,  no recent vision changes  Psych:   no anxiety, no depression    Physical Exam:   BP 144/79  Pulse 88  Resp 16  Ht 5\' 4"  (1.626 m)  Wt 101 lb (45.813 kg)  BMI 17.34 kg/m2  SpO2 98%  General:  Thin, fraill-appearing  HEENT:  Unremarkable   Neck:   no JVD, no bruits, no  adenopathy   Chest:   clear to auscultation, symmetrical breath sounds, no wheezes, no rhonchi   CV:   RRR, grade IV/VI holosystolic murmur   Abdomen:  soft, non-tender, no masses   Extremities:  warm, well-perfused, pulses diminished  Rectal/GU  Deferred  Neuro:   Grossly non-focal and symmetrical throughout  Skin:   Clean and dry, no rashes, no breakdown   Diagnostic Tests:  n/a   Impression:  Mitral valve prolapse with flail segment of the posterior leaflet of the mitral valve and severe mitral regurgitation. The patient claims to be nearly asymptomatic, although she does admit she gets tired easily and she will get short of breath if she hurries while she is doing something. Her physical activity is limited at baseline. She also has a very mobile density in the left ventricular output tract suggestive of papillary fibroelastoma. Risks associated with elective mitral valve repair and resection of this mass should be fairly low, but given the patient's advanced age the risks are certainly not negligible. Most concerning is the appearance of the mobile mass in the left ventricular tract do to concerns regarding the possibility for embolization, although this risk is probably still relatively low.  Risks of surgical intervention will be somewhat increased to do the patient's advanced age and other comorbid medical problems as well as a relatively frail physical status to begin with.  Options include proceeding with elective mitral valve repair and resection of the intracardiac mass versus continued close followup with medical therapy.   Plan:  I spent in excess of 45 minutes discussing matters with the patient and her husband here in the office today. The rationale for elective surgical intervention has been discussed as well as possible expectations for surgery. We discussed close medical followup as an alternative strategy with a small but significant risk of stroke do to embolization as well  as the potential for other complications related to mitral valve prolapse and mitral regurgitation. All of her questions been addressed. The patient and her husband want to think matters over further and seemed to be inclined to stick with medical therapy for the time being. I suggested that they call schedule an appointment see Dr. Anne Fu in the near future. We will plan to see her back in 3 months or sooner should she change her mind and desire to consider surgery sooner rather than later.  Salvatore Decent. Cornelius Moras, MD 02/08/2011 10:43 AM

## 2011-02-24 DIAGNOSIS — I059 Rheumatic mitral valve disease, unspecified: Secondary | ICD-10-CM | POA: Diagnosis not present

## 2011-04-15 DIAGNOSIS — I059 Rheumatic mitral valve disease, unspecified: Secondary | ICD-10-CM | POA: Diagnosis not present

## 2011-04-28 DIAGNOSIS — I059 Rheumatic mitral valve disease, unspecified: Secondary | ICD-10-CM | POA: Diagnosis not present

## 2011-05-10 ENCOUNTER — Ambulatory Visit (INDEPENDENT_AMBULATORY_CARE_PROVIDER_SITE_OTHER): Payer: Medicare Other | Admitting: Thoracic Surgery (Cardiothoracic Vascular Surgery)

## 2011-05-10 ENCOUNTER — Encounter: Payer: Self-pay | Admitting: Thoracic Surgery (Cardiothoracic Vascular Surgery)

## 2011-05-10 VITALS — BP 140/74 | HR 64 | Resp 18 | Ht 64.0 in | Wt 100.0 lb

## 2011-05-10 DIAGNOSIS — I340 Nonrheumatic mitral (valve) insufficiency: Secondary | ICD-10-CM

## 2011-05-10 DIAGNOSIS — I059 Rheumatic mitral valve disease, unspecified: Secondary | ICD-10-CM

## 2011-05-10 DIAGNOSIS — D4989 Neoplasm of unspecified behavior of other specified sites: Secondary | ICD-10-CM | POA: Diagnosis not present

## 2011-05-10 NOTE — Progress Notes (Signed)
                   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  She was originally seen in consultation on 11/23/2010 and most recently she was seen in followup on 02/08/2011. Since then she has been seen in followup by Dr. Anne Fu who reportedly did a followup echocardiogram. She has known mitral valve prolapse with severe mitral regurgitation. Left ventricular size and systolic function reportedly remains normal. She remains essentially asymptomatic. Her problems with her back has improved considerably and she is now no longer wearing a brace and she is getting around reasonably well. She did manage to quit smoking. She reports essentially no shortness of breath other than mild shortness of breath with very strenuous activity. She does not have any chest pain, palpitations, orthopnea, or lower extremity edema.   Current Outpatient Prescriptions  Medication Sig Dispense Refill  . amLODipine (NORVASC) 5 MG tablet Take 5 mg by mouth daily.        [name] aspirin 81 MG tablet Take 81 mg by mouth daily.        . calcium gluconate 500 MG tablet Take 500 mg by mouth daily.        . cholecalciferol (VITAMIN D) 400 UNITS TABS Take by mouth.        . co-enzyme Q-10 30 MG capsule Take 30 mg by mouth daily.       . metoprolol (LOPRESSOR) 50 MG tablet Take 50 mg by mouth daily.       . multivitamin (THERAGRAN) per tablet Take 1 tablet by mouth daily.        . multivitamin-lutein (OCUVITE-LUTEIN) CAPS Take 1 capsule by mouth daily.            Physical Exam:   BP 140/74  Pulse 64  Resp 18  Ht 5\' 4"  (1.626 m)  Wt 100 lb (45.36 kg)  BMI 17.17 kg/m2  SpO2 98%  General:  Elderly and frail but  well-appearing  Chest:   Clear to auscultation  CV:   Regular rate and rhythm with prominent systolic murmur  Incisions:  n/a  Abdomen:  Soft and nontender  Extremities:  Warm and well-perfused  Diagnostic Tests:  n/a   Impression:  Patient has mitral valve prolapse with severe mitral regurgitation and normal left ventricular size and systolic function. She remains essentially asymptomatic. 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 Because of her advanced age and physical frailty, she is very reluctant to consider elective surgical intervention. Under the circumstances this seems reasonable.  Plan:  I've reminded the patient and her husband that there is probably a small risk of embolization and stroke because of the papillary fibroelastoma.  Overall I suspect the risk is fairly low. Under the circumstances I agree with Dr. Anne Fu' plans to follow this patient carefully with serial echocardiograms every 6 months. We will plan to see her back in one year to make sure she continues to do well.   Salvatore Decent. Cornelius Moras, MD 05/10/2011 10:23 AM

## 2011-06-07 DIAGNOSIS — D0439 Carcinoma in situ of skin of other parts of face: Secondary | ICD-10-CM | POA: Diagnosis not present

## 2011-06-07 DIAGNOSIS — D239 Other benign neoplasm of skin, unspecified: Secondary | ICD-10-CM | POA: Diagnosis not present

## 2011-06-07 DIAGNOSIS — Z85828 Personal history of other malignant neoplasm of skin: Secondary | ICD-10-CM | POA: Diagnosis not present

## 2011-06-07 DIAGNOSIS — L821 Other seborrheic keratosis: Secondary | ICD-10-CM | POA: Diagnosis not present

## 2011-06-07 DIAGNOSIS — D485 Neoplasm of uncertain behavior of skin: Secondary | ICD-10-CM | POA: Diagnosis not present

## 2011-06-07 DIAGNOSIS — L820 Inflamed seborrheic keratosis: Secondary | ICD-10-CM | POA: Diagnosis not present

## 2011-07-15 DIAGNOSIS — L0889 Other specified local infections of the skin and subcutaneous tissue: Secondary | ICD-10-CM | POA: Diagnosis not present

## 2011-07-15 DIAGNOSIS — L089 Local infection of the skin and subcutaneous tissue, unspecified: Secondary | ICD-10-CM | POA: Diagnosis not present

## 2011-07-15 DIAGNOSIS — D0439 Carcinoma in situ of skin of other parts of face: Secondary | ICD-10-CM | POA: Diagnosis not present

## 2011-08-10 DIAGNOSIS — Z1331 Encounter for screening for depression: Secondary | ICD-10-CM | POA: Diagnosis not present

## 2011-08-10 DIAGNOSIS — I10 Essential (primary) hypertension: Secondary | ICD-10-CM | POA: Diagnosis not present

## 2011-08-10 DIAGNOSIS — K4090 Unilateral inguinal hernia, without obstruction or gangrene, not specified as recurrent: Secondary | ICD-10-CM | POA: Diagnosis not present

## 2011-09-01 ENCOUNTER — Ambulatory Visit (INDEPENDENT_AMBULATORY_CARE_PROVIDER_SITE_OTHER): Payer: Medicare Other | Admitting: General Surgery

## 2011-09-01 ENCOUNTER — Encounter (INDEPENDENT_AMBULATORY_CARE_PROVIDER_SITE_OTHER): Payer: Self-pay | Admitting: General Surgery

## 2011-09-01 VITALS — BP 140/64 | HR 72 | Temp 98.7°F | Resp 24 | Ht 62.0 in | Wt 100.0 lb

## 2011-09-01 DIAGNOSIS — K4090 Unilateral inguinal hernia, without obstruction or gangrene, not specified as recurrent: Secondary | ICD-10-CM | POA: Diagnosis not present

## 2011-09-01 MED ORDER — HERNIA SUPPORT RIGHT SMALL MISC: 1.0000 | Freq: Every day | Status: DC

## 2011-09-01 NOTE — Progress Notes (Signed)
Patient ID: Victoria Sexton, female   DOB: 11/27/1927, 78 y.o.   MRN: [id number]  Chief Complaint  Patient presents with  . Hernia    HPI Victoria Sexton is a 78 y.o. female.   HPI  78 year old Caucasian female referred by Dr. Cliffton Asters for evaluation of her known right inguinal hernia. The patient initially saw Dr. Freida Busman in 2009 for reducible right inguinal hernia. She was asymptomatic at that time. They elected to do observation. However over the past 2-3 months the patient reports intermittent sharp pain in her right groin. It will occur randomly. It will generally only lasts for a few seconds. However when it does occur it is quite intense. She denies any nausea, vomiting, diarrhea, or constipation. She denies any melena or hematochezia. The pain does not radiate. She has stopped smoking.she will occasionally notice a bulge.  She is being followed by her cardiologist and Dr. Cornelius Moras for severe mitral valve regurgitation.  Past Medical History  Diagnosis Date  . Hypertension   . PVC (premature ventricular contraction)   . MVP (mitral valve prolapse)   . Osteoporosis   . Inguinal hernia right  . Hiatal hernia   . Mitral regurgitation due to cusp prolapse   . Cardiac tumor, ventricular     LVOT  . Skin cancer 2013    Past Surgical History  Procedure Date  . Abdominal hysterectomy   . Leg fx repair   . Hemorroidectomy   . Tee without cardioversion 11/13/2010    Procedure: TRANSESOPHAGEAL ECHOCARDIOGRAM (TEE);  Surgeon: Donato Schultz, MD;  Location: [facility] ENDOSCOPY;  Service: Cardiovascular;  Laterality: N/A;  . Skin cancer excision 2013    not melanoma    No family history on file.  Social History History  Substance Use Topics  . Smoking status: Former Smoker -- 0.5 packs/day for 30 years    Types: Cigarettes    Quit date: 11/13/2010  . Smokeless tobacco: Not on file  . Alcohol Use: No    Allergies  Allergen Reactions  . Penicillins Palpitations  . Sulfa Antibiotics Rash     Current Outpatient Prescriptions  Medication Sig Dispense Refill  . amLODipine (NORVASC) 5 MG tablet Take 5 mg by mouth daily.        [name] aspirin 81 MG tablet Take 81 mg by mouth daily.        . Calcium Citrate-Vitamin D (CALCIUM CITRATE + D3 PO) Take 630 mg by mouth.      . co-enzyme Q-10 30 MG capsule Take 30 mg by mouth daily.       [name] KRILL OIL PO Take 300 mg by mouth.      . metoprolol (LOPRESSOR) 50 MG tablet Take 50 mg by mouth daily.       . multivitamin (THERAGRAN) per tablet Take 1 tablet by mouth daily.        . multivitamin-lutein (OCUVITE-LUTEIN) CAPS Take 1 capsule by mouth daily.        . vitamin E 400 UNIT capsule Take 400 Units by mouth daily.      Clinical research associate Bandages & Supports (HERNIA SUPPORT RIGHT SMALL) MISC 1 Device by Does not apply route daily.  5 each  1    Review of Systems Review of Systems  Constitutional: Negative for fever and chills.       Not very active  HENT: Negative for hearing loss and neck pain.   Eyes: Negative for photophobia and visual disturbance.  Respiratory: Negative for chest tightness, shortness  of breath and wheezing.   Cardiovascular: Positive for leg swelling (ankle). Negative for chest pain and palpitations.       Denies SOB, DOE, orthopnea, PND.   Gastrointestinal: Negative for nausea, diarrhea, constipation and blood in stool.  Genitourinary: Negative for dysuria and difficulty urinating.  Musculoskeletal: Negative for joint swelling.  Neurological: Negative for tremors, seizures, syncope, facial asymmetry, light-headedness and numbness.  Hematological: Negative for adenopathy. Does not bruise/bleed easily.  Psychiatric/Behavioral: Negative for suicidal ideas and confusion.    Blood pressure 140/64, pulse 72, temperature 98.7 F (37.1 C), temperature source Temporal, resp. rate 24, height 5\' 2"  (1.575 m), weight 100 lb (45.36 kg).  Physical Exam Physical Exam  Vitals reviewed. Constitutional: She is oriented to person, place,  and time. She appears well-developed and well-nourished. No distress.  HENT:  Head: Normocephalic and atraumatic.  Right Ear: External ear normal.  Left Ear: External ear normal.  Eyes: Conjunctivae are normal. No scleral icterus.  Neck: Neck supple. No tracheal deviation present.  Cardiovascular: Normal rate and regular rhythm.   Murmur heard. Pulmonary/Chest: Effort normal and breath sounds normal. No respiratory distress. She has no wheezes.  Abdominal: Soft. Bowel sounds are normal. She exhibits no distension. There is no tenderness. There is no rebound. A hernia is present. Hernia confirmed positive in the right inguinal area. Hernia confirmed negative in the left inguinal area.    Musculoskeletal: She exhibits no edema and no tenderness.  Neurological: She is alert and oriented to person, place, and time. She exhibits normal muscle tone.  Skin: Skin is warm and dry. No rash noted. She is not diaphoretic. No erythema.  Psychiatric: She has a normal mood and affect. Her behavior is normal. Judgment and thought content normal.    Data Reviewed Dr Orvan July last 2 office notes Dr Eliot Ford office note from 09/2007  Assessment    Reducible right inguinal hernia    Plan    We discussed the etiology of inguinal hernias. We discussed the signs & symptoms of incarceration & strangulation.  We discussed non-operative and operative management. We discussed wearing a truss versus surgical repair. I believe her risk of incarceration and strangulation is quite low. With her  Mitral regurgitation I believe she is at higher risk for surgery. It is possible we could do the surgery under a regional block and monitored anesthesia care. I advised her and her husband that she would need cardiology clearance if she elected to have surgery. And that I would want Dr. Judd Gaudier input as to what he thought her risk for surgery would be  The patient has elected to be fitted for a truss. Also the patient will  followup with Dr. Caro Hight to discuss with him and determine her risk for hernia surgery if the hernia belt does not control her discomfort. We did discuss surgery.    I described the procedure in detail.  The patient was given Agricultural engineer. We discussed the risks and benefits including but not limited to bleeding, infection, chronic inguinal pain, nerve entrapment, hernia recurrence, mesh complications, hematoma formation, urinary retention, injury to the ovaries, numbness in the groin, blood clots, injury to the surrounding structures, cardiac complications and anesthesia risk. We also discussed the typical post operative recovery course, including no heavy lifting for 4-6 weeks. I explained that the likelihood of improvement of their symptoms is good  F/u 4 months  Mary Sella. Andrey Campanile, MD, FACS General, Bariatric, & Minimally Invasive Surgery [facility] Surgery, [place]  Gaynelle Adu M 09/01/2011, 10:15 AM

## 2011-09-01 NOTE — Patient Instructions (Signed)
Hernia A hernia occurs when an internal organ pushes out through a weak spot in the abdominal wall. Hernias most commonly occur in the groin and around the navel. Hernias often can be pushed back into place (reduced). Most hernias tend to get worse over time. Some abdominal hernias can get stuck in the opening (irreducible or incarcerated hernia) and cannot be reduced. An irreducible abdominal hernia which is tightly squeezed into the opening is at risk for impaired blood supply (strangulated hernia). A strangulated hernia is a medical emergency. Because of the risk for an irreducible or strangulated hernia, surgery may be recommended to repair a hernia. CAUSES   Heavy lifting.   Prolonged coughing.   Straining to have a bowel movement.   A cut (incision) made during an abdominal surgery.  HOME CARE INSTRUCTIONS   Bed rest is not required. You may continue your normal activities.   Avoid lifting more than 10 pounds (4.5 kg) or straining.   Cough gently. If you are a smoker it is best to stop. Even the best hernia repair can break down with the continual strain of coughing. Even if you do not have your hernia repaired, a cough will continue to aggravate the problem. .   Eat a normal diet.   Avoid constipation. Straining over long periods of time will increase hernia size and encourage breakdown of repairs. If you cannot do this with diet alone, stool softeners may be used.  SEEK IMMEDIATE MEDICAL CARE IF:   You have a fever.   You develop increasing abdominal pain.   You feel nauseous or vomit.   Your hernia is stuck outside the abdomen, looks discolored, feels hard, or is tender.   You have any changes in your bowel habits or in the hernia that are unusual for you.   You have increased pain or swelling around the hernia.   You cannot push the hernia back in place by applying gentle pressure while lying down.  MAKE SURE YOU:   Understand these instructions.   Will watch your  condition.   Will get help right away if you are not doing well or get worse.  Document Released: 12/21/2004 Document Revised: 12/10/2010 Document Reviewed: 08/10/2007 [facility] Patient Information 2012 [place], [place].

## 2011-09-27 ENCOUNTER — Encounter (HOSPITAL_COMMUNITY): Payer: Self-pay | Admitting: Emergency Medicine

## 2011-09-27 ENCOUNTER — Emergency Department (HOSPITAL_COMMUNITY): Payer: Medicare Other

## 2011-09-27 ENCOUNTER — Inpatient Hospital Stay (HOSPITAL_COMMUNITY)
Admission: EM | Admit: 2011-09-27 | Discharge: 2011-09-30 | DRG: 286 | Disposition: A | Discharge status: Home / Self Care | Payer: Medicare Other | Attending: Cardiology | Admitting: Cardiology

## 2011-09-27 DIAGNOSIS — I708 Atherosclerosis of other arteries: Secondary | ICD-10-CM | POA: Diagnosis present

## 2011-09-27 DIAGNOSIS — I340 Nonrheumatic mitral (valve) insufficiency: Secondary | ICD-10-CM

## 2011-09-27 DIAGNOSIS — M810 Age-related osteoporosis without current pathological fracture: Secondary | ICD-10-CM | POA: Diagnosis present

## 2011-09-27 DIAGNOSIS — I714 Abdominal aortic aneurysm, without rupture, unspecified: Secondary | ICD-10-CM | POA: Diagnosis present

## 2011-09-27 DIAGNOSIS — I700 Atherosclerosis of aorta: Secondary | ICD-10-CM | POA: Diagnosis present

## 2011-09-27 DIAGNOSIS — I10 Essential (primary) hypertension: Secondary | ICD-10-CM | POA: Diagnosis not present

## 2011-09-27 DIAGNOSIS — I701 Atherosclerosis of renal artery: Secondary | ICD-10-CM | POA: Diagnosis present

## 2011-09-27 DIAGNOSIS — R0602 Shortness of breath: Secondary | ICD-10-CM

## 2011-09-27 DIAGNOSIS — I509 Heart failure, unspecified: Secondary | ICD-10-CM | POA: Diagnosis present

## 2011-09-27 DIAGNOSIS — J90 Pleural effusion, not elsewhere classified: Secondary | ICD-10-CM | POA: Diagnosis not present

## 2011-09-27 DIAGNOSIS — I5031 Acute diastolic (congestive) heart failure: Principal | ICD-10-CM | POA: Diagnosis present

## 2011-09-27 DIAGNOSIS — D7289 Other specified disorders of white blood cells: Secondary | ICD-10-CM | POA: Diagnosis not present

## 2011-09-27 DIAGNOSIS — J811 Chronic pulmonary edema: Secondary | ICD-10-CM | POA: Diagnosis not present

## 2011-09-27 DIAGNOSIS — E876 Hypokalemia: Secondary | ICD-10-CM | POA: Diagnosis not present

## 2011-09-27 DIAGNOSIS — I341 Nonrheumatic mitral (valve) prolapse: Secondary | ICD-10-CM

## 2011-09-27 DIAGNOSIS — I059 Rheumatic mitral valve disease, unspecified: Secondary | ICD-10-CM | POA: Diagnosis not present

## 2011-09-27 DIAGNOSIS — I424 Endocardial fibroelastosis: Secondary | ICD-10-CM | POA: Diagnosis present

## 2011-09-27 DIAGNOSIS — J9600 Acute respiratory failure, unspecified whether with hypoxia or hypercapnia: Secondary | ICD-10-CM | POA: Diagnosis present

## 2011-09-27 DIAGNOSIS — I2510 Atherosclerotic heart disease of native coronary artery without angina pectoris: Secondary | ICD-10-CM | POA: Diagnosis present

## 2011-09-27 DIAGNOSIS — Z85828 Personal history of other malignant neoplasm of skin: Secondary | ICD-10-CM

## 2011-09-27 DIAGNOSIS — Z87891 Personal history of nicotine dependence: Secondary | ICD-10-CM

## 2011-09-27 MED ORDER — ALBUTEROL SULFATE (5 MG/ML) 0.5% IN NEBU
2.5000 mg | INHALATION_SOLUTION | Freq: Once | RESPIRATORY_TRACT | Status: AC
  Administered 2011-09-27: 2.5 mg via RESPIRATORY_TRACT
  Filled 2011-09-27: qty 0.5

## 2011-09-27 MED ORDER — ALBUTEROL SULFATE (5 MG/ML) 0.5% IN NEBU
INHALATION_SOLUTION | RESPIRATORY_TRACT | Status: AC
  Administered 2011-09-27: 23:00:00
  Filled 2011-09-27: qty 1

## 2011-09-27 NOTE — ED Notes (Signed)
Per EMS pt in respiratory distress, o2 stat 81%ra, albuterol 5mg  neb tx attempted, pt unable to tolerate. enroute pt placed on CPAP with o2 sat increase to 85%. Pt is a/o x4

## 2011-09-27 NOTE — ED Notes (Signed)
ZOX:WRUE<AV> Expected date:09/27/11<BR> Expected time:10:39 PM<BR> Means of arrival:Ambulance<BR> Comments:<BR> Shortness of breath CPAP

## 2011-09-28 ENCOUNTER — Encounter (HOSPITAL_COMMUNITY): Payer: Self-pay | Admitting: *Deleted

## 2011-09-28 ENCOUNTER — Other Ambulatory Visit: Payer: Self-pay | Admitting: *Deleted

## 2011-09-28 DIAGNOSIS — E876 Hypokalemia: Secondary | ICD-10-CM | POA: Diagnosis not present

## 2011-09-28 DIAGNOSIS — M810 Age-related osteoporosis without current pathological fracture: Secondary | ICD-10-CM | POA: Diagnosis present

## 2011-09-28 DIAGNOSIS — I5031 Acute diastolic (congestive) heart failure: Secondary | ICD-10-CM | POA: Diagnosis not present

## 2011-09-28 DIAGNOSIS — J9600 Acute respiratory failure, unspecified whether with hypoxia or hypercapnia: Secondary | ICD-10-CM | POA: Diagnosis not present

## 2011-09-28 DIAGNOSIS — R0602 Shortness of breath: Secondary | ICD-10-CM

## 2011-09-28 DIAGNOSIS — I714 Abdominal aortic aneurysm, without rupture, unspecified: Secondary | ICD-10-CM | POA: Diagnosis present

## 2011-09-28 DIAGNOSIS — Z85828 Personal history of other malignant neoplasm of skin: Secondary | ICD-10-CM | POA: Diagnosis not present

## 2011-09-28 DIAGNOSIS — I424 Endocardial fibroelastosis: Secondary | ICD-10-CM | POA: Diagnosis not present

## 2011-09-28 DIAGNOSIS — I10 Essential (primary) hypertension: Secondary | ICD-10-CM | POA: Diagnosis present

## 2011-09-28 DIAGNOSIS — I340 Nonrheumatic mitral (valve) insufficiency: Secondary | ICD-10-CM

## 2011-09-28 DIAGNOSIS — I2510 Atherosclerotic heart disease of native coronary artery without angina pectoris: Secondary | ICD-10-CM | POA: Diagnosis not present

## 2011-09-28 DIAGNOSIS — Z01810 Encounter for preprocedural cardiovascular examination: Secondary | ICD-10-CM | POA: Diagnosis not present

## 2011-09-28 DIAGNOSIS — I700 Atherosclerosis of aorta: Secondary | ICD-10-CM | POA: Diagnosis present

## 2011-09-28 DIAGNOSIS — I708 Atherosclerosis of other arteries: Secondary | ICD-10-CM | POA: Diagnosis present

## 2011-09-28 DIAGNOSIS — I701 Atherosclerosis of renal artery: Secondary | ICD-10-CM | POA: Diagnosis present

## 2011-09-28 DIAGNOSIS — Z87891 Personal history of nicotine dependence: Secondary | ICD-10-CM | POA: Diagnosis not present

## 2011-09-28 DIAGNOSIS — I059 Rheumatic mitral valve disease, unspecified: Secondary | ICD-10-CM | POA: Diagnosis present

## 2011-09-28 DIAGNOSIS — I509 Heart failure, unspecified: Secondary | ICD-10-CM

## 2011-09-28 DIAGNOSIS — I501 Left ventricular failure: Secondary | ICD-10-CM | POA: Diagnosis not present

## 2011-09-28 LAB — BASIC METABOLIC PANEL
BUN: 9 mg/dL (ref 6–23)
CO2: 24 mEq/L (ref 19–32)
Chloride: 100 mEq/L (ref 96–112)
Creatinine, Ser: 0.57 mg/dL (ref 0.50–1.10)
GFR calc Af Amer: >90 mL/min (ref >90)
Glucose, Bld: 163 mg/dL — ABNORMAL HIGH (ref 70–99)
Potassium: 3.5 mEq/L (ref 3.5–5.1)

## 2011-09-28 LAB — BLOOD GAS, ARTERIAL
Drawn by: 232811
Patient temperature: 97.9
pH, Arterial: 7.442 (ref 7.350–7.450)

## 2011-09-28 LAB — CBC
HCT: 32.7 % — ABNORMAL LOW (ref 36.0–46.0)
MCH: 23.8 pg — ABNORMAL LOW (ref 26.0–34.0)
MCHC: 31.2 g/dL (ref 30.0–36.0)
MCV: 76.2 fL — ABNORMAL LOW (ref 78.0–100.0)
Platelets: 194 10*3/uL (ref 150–400)
RDW: 16.5 % — ABNORMAL HIGH (ref 11.5–15.5)
WBC: 12.9 10*3/uL — ABNORMAL HIGH (ref 4.0–10.5)

## 2011-09-28 LAB — PRO B NATRIURETIC PEPTIDE: Pro B Natriuretic peptide (BNP): 1826 pg/mL — ABNORMAL HIGH (ref 0–450)

## 2011-09-28 LAB — PATHOLOGIST SMEAR REVIEW

## 2011-09-28 LAB — CBC WITH DIFFERENTIAL/PLATELET
Basophils Relative: 0 % (ref 0–1)
Eosinophils Absolute: 0.1 10*3/uL (ref 0.0–0.7)
HCT: 37 % (ref 36.0–46.0)
Hemoglobin: 11.5 g/dL — ABNORMAL LOW (ref 12.0–15.0)
Lymphocytes Relative: 54 % — ABNORMAL HIGH (ref 12–46)
Lymphs Abs: 6.6 10*3/uL — ABNORMAL HIGH (ref 0.7–4.0)
MCHC: 31.1 g/dL (ref 30.0–36.0)
MCV: 77.1 fL — ABNORMAL LOW (ref 78.0–100.0)
Monocytes Relative: 4 % (ref 3–12)
Neutro Abs: 5 10*3/uL (ref 1.7–7.7)

## 2011-09-28 LAB — TSH: TSH: 1.475 u[IU]/mL (ref 0.350–4.500)

## 2011-09-28 LAB — MRSA PCR SCREENING: MRSA by PCR: NEGATIVE

## 2011-09-28 MED ORDER — BIOTENE DRY MOUTH MT LIQD
15.0000 mL | Freq: Two times a day (BID) | OROMUCOSAL | Status: DC
  Administered 2011-09-28 – 2011-09-29 (×3): 15 mL via OROMUCOSAL

## 2011-09-28 MED ORDER — METOPROLOL TARTRATE 25 MG PO TABS
25.0000 mg | ORAL_TABLET | Freq: Every day | ORAL | Status: DC
  Administered 2011-09-28 – 2011-09-30 (×3): 25 mg via ORAL
  Filled 2011-09-28 (×3): qty 1

## 2011-09-28 MED ORDER — ASPIRIN 81 MG PO CHEW
81.0000 mg | CHEWABLE_TABLET | Freq: Every day | ORAL | Status: DC
  Administered 2011-09-28 – 2011-09-30 (×3): 81 mg via ORAL
  Filled 2011-09-28 (×3): qty 1

## 2011-09-28 MED ORDER — ACETAMINOPHEN 325 MG PO TABS: 650.0000 mg | ORAL_TABLET | ORAL | Status: DC | PRN

## 2011-09-28 MED ORDER — SODIUM CHLORIDE 0.9 % IJ SOLN
3.0000 mL | Freq: Two times a day (BID) | INTRAMUSCULAR | Status: DC
  Administered 2011-09-28 – 2011-09-30 (×5): 3 mL via INTRAVENOUS

## 2011-09-28 MED ORDER — SODIUM CHLORIDE 0.9 % IV SOLN: 250.0000 mL | INTRAVENOUS | Status: DC | PRN

## 2011-09-28 MED ORDER — FUROSEMIDE 10 MG/ML IJ SOLN: 20.0000 mg | Freq: Once | INTRAMUSCULAR | Status: DC

## 2011-09-28 MED ORDER — AMLODIPINE BESYLATE 5 MG PO TABS
5.0000 mg | ORAL_TABLET | Freq: Every day | ORAL | Status: DC
  Administered 2011-09-28 – 2011-09-30 (×3): 5 mg via ORAL
  Filled 2011-09-28 (×3): qty 1

## 2011-09-28 MED ORDER — HEPARIN SODIUM (PORCINE) 5000 UNIT/ML IJ SOLN
5000.0000 [IU] | Freq: Three times a day (TID) | INTRAMUSCULAR | Status: DC
  Administered 2011-09-28 – 2011-09-30 (×6): 5000 [IU] via SUBCUTANEOUS
  Filled 2011-09-28 (×10): qty 1

## 2011-09-28 MED ORDER — COENZYME Q10 30 MG PO CAPS: 30.0000 mg | ORAL_CAPSULE | Freq: Every day | ORAL | Status: DC

## 2011-09-28 MED ORDER — MULTIVITAMINS PO TABS: 1.0000 | ORAL_TABLET | Freq: Every day | ORAL | Status: DC

## 2011-09-28 MED ORDER — OCUVITE-LUTEIN PO CAPS
1.0000 | ORAL_CAPSULE | Freq: Every day | ORAL | Status: DC
  Filled 2011-09-28: qty 1

## 2011-09-28 MED ORDER — OCUVITE PO TABS
1.0000 | ORAL_TABLET | Freq: Every day | ORAL | Status: DC
  Administered 2011-09-28 – 2011-09-30 (×3): 1 via ORAL
  Filled 2011-09-28 (×4): qty 1

## 2011-09-28 MED ORDER — SODIUM CHLORIDE 0.9 % IJ SOLN: 3.0000 mL | INTRAMUSCULAR | Status: DC | PRN

## 2011-09-28 MED ORDER — FUROSEMIDE 10 MG/ML IJ SOLN: 40.0000 mg | Freq: Two times a day (BID) | INTRAMUSCULAR | Status: DC

## 2011-09-28 MED ORDER — ADULT MULTIVITAMIN W/MINERALS CH
1.0000 | ORAL_TABLET | Freq: Every day | ORAL | Status: DC
  Administered 2011-09-28 – 2011-09-30 (×3): 1 via ORAL
  Filled 2011-09-28 (×3): qty 1

## 2011-09-28 MED ORDER — HERNIA SUPPORT RIGHT SMALL MISC: 1.0000 | Freq: Every day | Status: DC

## 2011-09-28 MED ORDER — FUROSEMIDE 10 MG/ML IJ SOLN
40.0000 mg | Freq: Two times a day (BID) | INTRAMUSCULAR | Status: DC
  Administered 2011-09-28 – 2011-09-29 (×3): 40 mg via INTRAVENOUS
  Filled 2011-09-28 (×5): qty 4

## 2011-09-28 MED ORDER — ONDANSETRON HCL 4 MG/2ML IJ SOLN: 4.0000 mg | Freq: Four times a day (QID) | INTRAMUSCULAR | Status: DC | PRN

## 2011-09-28 MED ORDER — VITAMIN E 180 MG (400 UNIT) PO CAPS
400.0000 [IU] | ORAL_CAPSULE | Freq: Every day | ORAL | Status: DC
  Administered 2011-09-28 – 2011-09-30 (×3): 400 [IU] via ORAL
  Filled 2011-09-28 (×3): qty 1

## 2011-09-28 MED ORDER — ASPIRIN 81 MG PO TABS
81.0000 mg | ORAL_TABLET | Freq: Every day | ORAL | Status: DC
Start: 2011-09-28 — End: 2011-09-28

## 2011-09-28 NOTE — Consult Note (Addendum)
CARDIOTHORACIC SURGERY CONSULTATION REPORT  PCP is Cala Bradford, MD Referring Provider is TURNER, Cornelious Bryant, MD Primary Cardiologist is Donato Schultz, MD   Reason for consultation:  Mitral regurgitation  HPI:  Patient is an 78 year old female with mitral valve prolapse and severe mitral regurgitation who is well known to me.  I originally saw her in consultation on 11/23/2010 and have followed her since then several times in the office, most recently on 05/10/2011.  The patient has mitral valve prolapse with a large flail segment of the posterior leaflet and severe mitral regurgitation.  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 Up until now the patient has remained asymptomatic, and because of this she has previously declined my offer to proceed with elective mitral valve repair and resection of the small mass in the left ventricular outflow tract.  The patient reports that recently she has been feeling more tired than usual.  She has been less active physically but otherwise reports no specific problems until yesterday evening when she developed a somewhat acute onset of resting shortness of breath associated with PND and orthopnea. She states that she also had some tightness across her chest.  She was brought to the emergency department where she was found to be in acute congestive heart failure with elevated pro BNP level and pulmonary edema on baseline CXR.  Electrocardiogram was notable for sinus rhythm without ischemic changes and troponin was negative.  Symptoms have improved rapidly with diuretic therapy.  Cardiothoracic surgical consultation has been requested.  Past Medical History  Diagnosis Date  . Hypertension   . MVP (mitral valve prolapse)   . Osteoporosis   . Inguinal hernia right  . Mitral regurgitation due to cusp prolapse   . Cardiac tumor, ventricular     LVOT  . PVC  (premature ventricular contraction)   . Hiatal hernia   . Skin cancer 2013    Past Surgical History  Procedure Date  . Abdominal hysterectomy   . Leg fx repair   . Hemorroidectomy   . Tee without cardioversion 11/13/2010    Procedure: TRANSESOPHAGEAL ECHOCARDIOGRAM (TEE);  Surgeon: Donato Schultz, MD;  Location: [facility] ENDOSCOPY;  Service: Cardiovascular;  Laterality: N/A;  . Skin cancer excision 2013    not melanoma    History reviewed. No pertinent family history.  Social History History  Substance Use Topics  . Smoking status: Former Smoker -- 0.5 packs/day for 30 years    Types: Cigarettes    Quit date: 11/13/2010  . Smokeless tobacco: Not on file  . Alcohol Use: No    Prior to Admission medications   Medication Sig Start Date End Date Taking? Authorizing Provider  amLODipine (NORVASC) 5 MG tablet Take 5 mg by mouth daily.     Yes Historical Provider, MD  aspirin 81 MG tablet Take 81 mg by mouth daily.     Yes Historical Provider, MD  Calcium Citrate-Vitamin D (CALCIUM CITRATE + D3 PO) Take 630 mg by mouth.   Yes Historical Provider, MD  co-enzyme Q-10 30 MG capsule Take 30 mg by mouth daily.    Yes Historical Provider, MD  Elastic Bandages & Supports (HERNIA SUPPORT RIGHT SMALL) MISC 1 Device by Does not apply route daily. 09/01/11  Yes Atilano Ina, MD,FACS  KRILL OIL PO Take 300 mg by mouth.   Yes Historical Provider, MD  metoprolol (LOPRESSOR) 50 MG  tablet Take 50 mg by mouth daily.    Yes Historical Provider, MD  multivitamin [facility]) per tablet Take 1 tablet by mouth daily.     Yes Historical Provider, MD  multivitamin-lutein (OCUVITE-LUTEIN) CAPS Take 1 capsule by mouth daily.     Yes Historical Provider, MD  vitamin E 400 UNIT capsule Take 400 Units by mouth daily.   Yes Historical Provider, MD    Current Facility-Administered Medications  Medication Dose Route Frequency Provider Last Rate Last Dose  . 0.9 %  sodium chloride infusion  250 mL Intravenous PRN  Pelbreton C. Terressa Koyanagi, MD      . acetaminophen (TYLENOL) tablet 650 mg  650 mg Oral Q4H PRN Pelbreton C. Terressa Koyanagi, MD      . albuterol (PROVENTIL) (5 MG/ML) 0.5% nebulizer solution 2.5 mg  2.5 mg Nebulization Once Olivia Mackie, MD   2.5 mg at 09/27/11 2339  . albuterol (PROVENTIL) (5 MG/ML) 0.5% nebulizer solution           . amLODipine (NORVASC) tablet 5 mg  5 mg Oral Daily Pelbreton C. Terressa Koyanagi, MD   5 mg at 09/28/11 0902  . antiseptic oral rinse (BIOTENE) solution 15 mL  15 mL Mouth Rinse BID Donato Schultz, MD   15 mL at 09/28/11 0800  . aspirin chewable tablet 81 mg  81 mg Oral Daily Dolores Patty, MD   81 mg at 09/28/11 0900  . beta carotene w/minerals (OCUVITE) tablet 1 tablet  1 tablet Oral Daily Donato Schultz, MD   1 tablet at 09/28/11 0901  . furosemide (LASIX) injection 40 mg  40 mg Intravenous BID Quintella Reichert, MD   40 mg at 09/28/11 1134  . heparin injection 5,000 Units  5,000 Units Subcutaneous Q8H Pelbreton C. Terressa Koyanagi, MD   5,000 Units at 09/28/11 1319  . metoprolol tartrate (LOPRESSOR) tablet 25 mg  25 mg Oral Daily Pelbreton C. Terressa Koyanagi, MD   25 mg at 09/28/11 0902  . multivitamin with minerals tablet 1 tablet  1 tablet Oral Daily Dolores Patty, MD   1 tablet at 09/28/11 0901  . ondansetron (ZOFRAN) injection 4 mg  4 mg Intravenous Q6H PRN Pelbreton C. Balfour, MD      . sodium chloride 0.9 % injection 3 mL  3 mL Intravenous Q12H Pelbreton C. Terressa Koyanagi, MD   3 mL at 09/28/11 0904  . sodium chloride 0.9 % injection 3 mL  3 mL Intravenous PRN Pelbreton C. Balfour, MD      . vitamin E capsule 400 Units  400 Units Oral Daily Pelbreton C. Terressa Koyanagi, MD   400 Units at 09/28/11 0901  . DISCONTD: aspirin tablet 81 mg  81 mg Oral Daily Pelbreton C. Balfour, MD      . DISCONTD: co-enzyme Q-10 capsule 30 mg  30 mg Oral Daily Pelbreton C. Terressa Koyanagi, MD      . DISCONTD: furosemide (LASIX) injection 20 mg  20 mg Intravenous Once Pelbreton C. Terressa Koyanagi, MD      . DISCONTD: furosemide (LASIX)  injection 40 mg  40 mg Intravenous BID Quintella Reichert, MD      . DISCONTD: Hernia Support Right Small MISC 1 Device  1 Device Does not apply Daily Pelbreton C. Terressa Koyanagi, MD      . DISCONTD: multivitamin [facility]) per tablet 1 tablet  1 tablet Oral Daily Pelbreton C. Terressa Koyanagi, MD      . DISCONTD: multivitamin-lutein (OCUVITE-LUTEIN) capsule 1 capsule  1 capsule Oral Daily Pelbreton C. Terressa Koyanagi, MD  Allergies  Allergen Reactions  . Penicillins Palpitations  . Sulfa Antibiotics Rash    Review of Systems:  General:  normal appetite, decreased energy   Respiratory:  no cough, no wheezing, no hemoptysis, no pain with inspiration or cough, + shortness of breath   Cardiac:  + chest tightness yesterday evening but none since, + exertional SOB, + resting SOB, + PND, + orthopnea, no LE edema, + palpitations, no syncope  GI:   no difficulty swallowing, no hematochezia, no hematemesis, no melena, no constipation, no diarrhea   GU:   no dysuria, no urgency, no frequency   Musculoskeletal: no arthritis, no arthralgia   Vascular:  no pain suggestive of claudication   Neuro:   no symptoms suggestive of TIA's, no seizures, no headaches, no peripheral neuropathy   Endocrine:  Negative   HEENT:  no loose teeth or painful teeth,  no recent vision changes  Psych:   no anxiety, no depression    Physical Exam:   BP 102/44  Pulse 79  Temp 98.1 F (36.7 C) (Oral)  Resp 25  Ht 5' (1.524 m)  Wt 45.2 kg (99 lb 10.4 oz)  BMI 19.46 kg/m2  SpO2 93%  General:  Thin and frail-appearing  HEENT:  Unremarkable   Neck:   no JVD, no bruits, no adenopathy   Chest:   clear to auscultation, symmetrical breath sounds, no wheezes, no rhonchi   CV:   RRR, loud grade IV/VI holosystolic murmur   Abdomen:  soft, non-tender, no masses   Extremities:  warm, well-perfused, pulses diminished, no LE edema  Rectal/GU  Deferred  Neuro:   Grossly non-focal and symmetrical throughout  Skin:   Clean and dry, no rashes, no  breakdown  Diagnostic Tests:  Results for TONIANNE, FINE (MRN [id number]) as of 09/28/2011 15:28  Ref. Range 09/27/2011 23:14 09/28/2011 00:05  Troponin I Latest Range: <0.30 ng/mL <0.30   Pro B Natriuretic peptide (BNP) Latest Range: 0-450 pg/mL  1826.0 (H)     *RADIOLOGY REPORT*  Clinical Data: 78 year old female with respiratory stress.  PORTABLE CHEST - 1 VIEW  Comparison: 11/03/2010 and earlier.  Findings: New small to moderate bilateral pleural effusions and  diffuse increased interstitial opacity. Chronic severe  cardiomegaly with superimposed moderate retrocardiac gastric  hernia. No pneumothorax.  IMPRESSION:  Acute pulmonary edema with bilateral pleural effusions in the  setting of chronic cardiomegaly. Severe bilateral respiratory  infection is a less likely consideration.  Original Report Authenticated By: Harley Hallmark, M.D.   Impression:  Patient has known mitral valve prolapse with severe mitral regurgitation and now presents with acute exacerbation of congestive heart failure. In the past echocardiograms have demonstrated normal left ventricular size and systolic function.  She has never undergone cardiac catheterization.   Plan:  I spent in excess of 30 minutes discussing long-term treatment options with the patient and her husband this afternoon. She is now interested in considering possible elective surgical intervention.  I recommend followup transthoracic echocardiogram and left and right heart catheterization during this hospitalization.   I would also favor catheterization from either the left groin or radial artery approach to include angiography of the descending thoracic and abdominal aorta, as it is possible that the patient may be candidate for minimally invasive approach for surgery in the absence of significant coronary artery disease.  I will be out of town the rest of this week. If she is discharged from the hospital later this week I will be happy to make  arrangements for followup in the office next Monday to make definitive plans for surgery.  All of their questions been addressed.    Salvatore Decent. Cornelius Moras, MD 09/28/2011 3:19 PM

## 2011-09-28 NOTE — Progress Notes (Addendum)
Pre-op Cardiac Surgery  Carotid Findings:  Bilaterally 40-59% ICA stenosis, highest end of scale. Antegrade vertebral flow.   LIMB DOPPLERS STILL TO BE DONE Upper Extremity Right Left  Brachial Pressures    Radial Waveforms    Ulnar Waveforms    Palmar Arch (Allen's Test)     Findings:      Lower  Extremity Right Left  Dorsalis Pedis    Anterior Tibial    Posterior Tibial    Ankle/Brachial Indices      Findings:

## 2011-09-28 NOTE — H&P (Signed)
Cardiology History and Physical  WHITE,CYNTHIA S, MD  History of Present Illness (and review of medical records): Victoria Sexton is a 78 y.o. female who presents for evaluation of shortness of breath.  She is here with her husband at the bedside.  She states that shortness of breath began around 10pm prior to admission.  She had PND and orthopnea.  She had no LE swelling.  She denied any chest pain.  She was hypoxic on initial presentation requiring NIV and then high flow oyxgen.  Of note she has been seen by Dr. 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  She was planned for elective surgical management.   Review of Systems Further review of systems was otherwise negative other than stated in HPI.  Patient Active Problem List   Diagnosis Date Noted  . Shortness of breath 09/28/2011  . Right inguinal hernia 09/01/2011  . Mitral valve disorders 02/08/2011  . Mitral regurgitation due to cusp prolapse 11/23/2010  . Cardiac tumor, ventricular 11/23/2010  . Mitral regurgitation 11/12/2010  . Essential hypertension, benign 11/12/2010   Past Medical History  Diagnosis Date  . Hypertension   . PVC (premature ventricular contraction)   . MVP (mitral valve prolapse)   . Osteoporosis   . Inguinal hernia right  . Hiatal hernia   . Mitral regurgitation due to cusp prolapse   . Cardiac tumor, ventricular     LVOT  . Skin cancer 2013    Past Surgical History  Procedure Date  . Abdominal hysterectomy   . Leg fx repair   . Hemorroidectomy   . Tee without cardioversion 11/13/2010    Procedure: TRANSESOPHAGEAL ECHOCARDIOGRAM (TEE);  Surgeon: Donato Schultz, MD;  Location: [facility] ENDOSCOPY;  Service: Cardiovascular;  Laterality: N/A;  . Skin cancer excision 2013    not melanoma     (Not in a hospital admission) Allergies  Allergen Reactions  . Penicillins Palpitations  . Sulfa Antibiotics Rash    History  Substance Use Topics  . Smoking status:  Former Smoker -- 0.5 packs/day for 30 years    Types: Cigarettes    Quit date: 11/13/2010  . Smokeless tobacco: Not on file  . Alcohol Use: No    No family history on file.   Objective: Patient Vitals for the past 8 hrs:  BP Temp Pulse Resp SpO2  09/28/11 0430 114/64 mmHg - 88  32  98 %  09/28/11 0330 102/61 mmHg - 89  20  97 %  09/28/11 0300 111/58 mmHg - 85  25  96 %  09/28/11 0230 118/56 mmHg - 86  30  94 %  09/28/11 0205 122/65 mmHg - 88  24  94 %  09/28/11 0200 122/65 mmHg - 85  31  93 %  09/28/11 0144 - - - 23  -  09/28/11 0130 128/54 mmHg - 86  23  95 %  09/28/11 0031 - - 85  38  100 %  09/27/11 2347 - - 88  29  100 %  09/27/11 2339 - - - - 100 %  09/27/11 2255 166/95 mmHg - 94  22  100 %  09/27/11 2252 - - - - 85 %  09/27/11 2250 - - 102  40  95 %   General Appearance:    Alert, cooperative, no distress, elderly appearing female  Head:    Normocephalic, without obvious abnormality, atraumatic  Eyes:     PERRL, EOMI, anicteric sclerae  Neck:   Supple, positive JVD  Lungs:     Decreased breath sounds at bases, respirations labored  Heart:    Regular rate and rhythm, S1 and S2 normal, 3/6 systolic murmur  Abdomen:     Soft, non-tender, normoactive bowel sounds  Extremities:   Extremities normal, atraumatic, no cyanosis or edema  Pulses:   2+ and symmetric all extremities  Skin:   no rashes or lesions  Neurologic:   No focal deficits. AAO x3   Results for orders placed during the hospital encounter of 09/27/11 (from the past 48 hour(s))  TROPONIN I     Status: Normal   Collection Time   09/27/11 11:14 PM      Component Value Range Comment   Troponin I <0.30  <0.30 ng/mL   PROCALCITONIN     Status: Normal   Collection Time   09/27/11 11:14 PM      Component Value Range Comment   Procalcitonin <0.10     BASIC METABOLIC PANEL     Status: Abnormal   Collection Time   09/27/11 11:40 PM      Component Value Range Comment   Sodium 136  135 - 145 mEq/L    Potassium 3.5   3.5 - 5.1 mEq/L    Chloride 100  96 - 112 mEq/L    CO2 24  19 - 32 mEq/L    Glucose, Bld 163 (*) 70 - 99 mg/dL    BUN 9  6 - 23 mg/dL    Creatinine, Ser 4.09  0.50 - 1.10 mg/dL    Calcium 9.7  8.4 - 81.1 mg/dL    GFR calc non Af Amer 84 (*) >90 mL/min    GFR calc Af Amer >90  >90 mL/min   CBC WITH DIFFERENTIAL     Status: Abnormal   Collection Time   09/27/11 11:40 PM      Component Value Range Comment   WBC 12.2 (*) 4.0 - 10.5 K/uL    RBC 4.80  3.87 - 5.11 MIL/uL    Hemoglobin 11.5 (*) 12.0 - 15.0 g/dL    HCT 91.4  78.2 - 95.6 %    MCV 77.1 (*) 78.0 - 100.0 fL    MCH 24.0 (*) 26.0 - 34.0 pg    MCHC 31.1  30.0 - 36.0 g/dL    RDW 21.3 (*) 08.6 - 15.5 %    Platelets 243  150 - 400 K/uL    Neutrophils Relative 41 (*) 43 - 77 %    Lymphocytes Relative 54 (*) 12 - 46 %    Monocytes Relative 4  3 - 12 %    Eosinophils Relative 1  0 - 5 %    Basophils Relative 0  0 - 1 %    Neutro Abs 5.0  1.7 - 7.7 K/uL    Lymphs Abs 6.6 (*) 0.7 - 4.0 K/uL    Monocytes Absolute 0.5  0.1 - 1.0 K/uL    Eosinophils Absolute 0.1  0.0 - 0.7 K/uL    Basophils Absolute 0.0  0.0 - 0.1 K/uL    Smear Review MORPHOLOGY UNREMARKABLE   PENDING PATHOLOGIST REVIEW  LACTIC ACID, PLASMA     Status: Normal   Collection Time   09/27/11 11:40 PM      Component Value Range Comment   Lactic Acid, Venous 2.1  0.5 - 2.2 mmol/L   PRO B NATRIURETIC PEPTIDE     Status: Abnormal   Collection Time   09/28/11  12:05 AM      Component Value Range Comment   Pro B Natriuretic peptide (BNP) 1826.0 (*) 0 - 450 pg/mL   BLOOD GAS, ARTERIAL     Status: Abnormal   Collection Time   09/28/11  3:43 AM      Component Value Range Comment   O2 Content 3.0      Delivery systems NASAL CANNULA      pH, Arterial 7.442  7.350 - 7.450    pCO2 arterial 36.2  35.0 - 45.0 mmHg    pO2, Arterial 77.1 (*) 80.0 - 100.0 mmHg    Bicarbonate 24.4 (*) 20.0 - 24.0 mEq/L    TCO2 22.5  0 - 100 mmol/L    Acid-Base Excess 0.8  0.0 - 2.0 mmol/L    O2  Saturation 95.9      Patient temperature 97.9      Collection site LEFT RADIAL      Drawn by [id number]      Sample type ARTERIAL      Allens test (pass/fail) PASS  PASS    Dg Chest Port 1 View  09/27/2011  *RADIOLOGY REPORT*  Clinical Data: 78 year old female with respiratory stress.  PORTABLE CHEST - 1 VIEW  Comparison: 11/03/2010 and earlier.  Findings: New small to moderate bilateral pleural effusions and diffuse increased interstitial opacity.  Chronic severe cardiomegaly with superimposed moderate retrocardiac gastric hernia.  No pneumothorax.  IMPRESSION: Acute pulmonary edema with bilateral pleural effusions in the setting of chronic cardiomegaly.  Severe bilateral respiratory infection is a less likely consideration.   Original Report Authenticated By: Ulla Potash III, M.D.     ECG:  Sinus rhythm with PVCs on telemetry, no ecg available   Assessment: Acute congestive heart failure. Severe MR Cardiac mass in LVOT   Plan:  1. Admit to Cardiology. 2. Continuous monitoring on Telemetry. 3. Repeat ekg on admit, prn chest pain or arrythmia 4. IV lasix 20mg  x 1, strict I/O, daily weights, 1.5 liter fluid restriction 5. TTE in am 6. May need to reevaluate mass and assess progression as cause of symptoms.

## 2011-09-28 NOTE — ED Provider Notes (Signed)
History     CSN: [id number]  Arrival date & time 09/27/11  2247   First MD Initiated Contact with Patient 09/27/11 2306      Chief Complaint  Patient presents with  . Respiratory Distress    (Consider location/radiation/quality/duration/timing/severity/associated sxs/prior treatment) HPI 78 year old female presents to emergency room with reported respiratory distress. Patient was in her normal state of health earlier in the day, was walking up the stairs when she became acutely short of breath. EMS reports upon their arrival patient was hypoxic with initial sats in the 80s. They attempted to give albuterol in route, the patient did not tolerate treatment. No prior history of asthma, COPD. She has history of mitral valve prolapse with reported cardiac tumor. No prior history of coronary disease or CHF. She denies any chest pain at this time. Patient was going to have her mitral valve fixed, but had herniated disc and surgery was put on hold. No reported fever chills or increased cough prior to walking up the stairs. Patient was placed on CPAP by EMS, with improved sats to 100. Patient is uncomfortable, is tolerating the BiPAP but only just barely. She denies any leg swelling, no history of PE or DVT. Past Medical History  Diagnosis Date  . Hypertension   . MVP (mitral valve prolapse)   . Osteoporosis   . Inguinal hernia right  . Mitral regurgitation due to cusp prolapse   . Cardiac tumor, ventricular     LVOT  . PVC (premature ventricular contraction)   . Hiatal hernia   . Skin cancer 2013    Past Surgical History  Procedure Date  . Abdominal hysterectomy   . Leg fx repair   . Hemorroidectomy   . Tee without cardioversion 11/13/2010    Procedure: TRANSESOPHAGEAL ECHOCARDIOGRAM (TEE);  Surgeon: Donato Schultz, MD;  Location: [facility] ENDOSCOPY;  Service: Cardiovascular;  Laterality: N/A;  . Skin cancer excision 2013    not melanoma    History reviewed. No pertinent family  history.  History  Substance Use Topics  . Smoking status: Former Smoker -- 0.5 packs/day for 30 years    Types: Cigarettes    Quit date: 11/13/2010  . Smokeless tobacco: Not on file  . Alcohol Use: No    OB History    Grav Para Term Preterm Abortions TAB SAB Ect Mult Living                  Review of Systems  All other systems reviewed and are negative.    Allergies  Penicillins and Sulfa antibiotics  Home Medications  No current outpatient prescriptions on file.  BP 110/55  Pulse 87  Temp 98.4 F (36.9 C) (Oral)  Resp 28  Ht 5' (1.524 m)  Wt 99 lb 10.4 oz (45.2 kg)  BMI 19.46 kg/m2  SpO2 93%  Physical Exam  Constitutional: She is oriented to person, place, and time. She appears distressed.       Frail, ill-appearing, elderly  HENT:  Head: Normocephalic and atraumatic.  Nose: Nose normal.  Mouth/Throat: Oropharynx is clear and moist.  Eyes: Conjunctivae normal and EOM are normal. Pupils are equal, round, and reactive to light.  Neck: Normal range of motion. Neck supple. JVD present. No tracheal deviation present. No thyromegaly present.  Cardiovascular: Regular rhythm and intact distal pulses.  Exam reveals no gallop and no friction rub.   Murmur heard.      Tachycardia  Pulmonary/Chest: No stridor. She is in respiratory distress. She has rales.  She exhibits no tenderness.       Tachypnea and rales to two thirds of the way up bilaterally  Abdominal: Soft. Bowel sounds are normal. She exhibits no distension and no mass. There is no tenderness. There is no rebound and no guarding.  Musculoskeletal: Normal range of motion. She exhibits edema (trace edema). She exhibits no tenderness.  Lymphadenopathy:    She has no cervical adenopathy.  Neurological: She is alert and oriented to person, place, and time.  Skin: Skin is warm. No rash noted. She is diaphoretic. No erythema. No pallor.  Psychiatric: She has a normal mood and affect. Her behavior is normal. Judgment  and thought content normal.    ED Course  Procedures (including critical care time)  CRITICAL CARE Performed by: Olivia Mackie   Total critical care time: 45  Critical care time was exclusive of separately billable procedures and treating other patients.  Critical care was necessary to treat or prevent imminent or life-threatening deterioration.  Critical care was time spent personally by me on the following activities: development of treatment plan with patient and/or surrogate as well as nursing, discussions with consultants, evaluation of patient's response to treatment, examination of patient, obtaining history from patient or surrogate, ordering and performing treatments and interventions, ordering and review of laboratory studies, ordering and review of radiographic studies, pulse oximetry and re-evaluation of patient's condition.   Labs Reviewed  BASIC METABOLIC PANEL - Abnormal; Notable for the following:    Glucose, Bld 163 (*)     GFR calc non Af Amer 84 (*)     All other components within normal limits  CBC WITH DIFFERENTIAL - Abnormal; Notable for the following:    WBC 12.2 (*)     Hemoglobin 11.5 (*)     MCV 77.1 (*)     MCH 24.0 (*)     RDW 16.7 (*)     Neutrophils Relative 41 (*)     Lymphocytes Relative 54 (*)     Lymphs Abs 6.6 (*)     All other components within normal limits  PRO B NATRIURETIC PEPTIDE - Abnormal; Notable for the following:    Pro B Natriuretic peptide (BNP) 1826.0 (*)     All other components within normal limits  BLOOD GAS, ARTERIAL - Abnormal; Notable for the following:    pO2, Arterial 77.1 (*)     Bicarbonate 24.4 (*)     All other components within normal limits  CBC - Abnormal; Notable for the following:    WBC 12.9 (*)     Hemoglobin 10.2 (*)     HCT 32.7 (*)     MCV 76.2 (*)     MCH 23.8 (*)     RDW 16.5 (*)     All other components within normal limits  CREATININE, SERUM - Abnormal; Notable for the following:    GFR calc  non Af Amer 88 (*)     All other components within normal limits  TROPONIN I  LACTIC ACID, PLASMA  PROCALCITONIN  MRSA PCR SCREENING  TROPONIN I  CULTURE, BLOOD (ROUTINE X 2)  CULTURE, BLOOD (ROUTINE X 2)  PATHOLOGIST SMEAR REVIEW  TSH   Dg Chest Port 1 View  09/27/2011  *RADIOLOGY REPORT*  Clinical Data: 78 year old female with respiratory stress.  PORTABLE CHEST - 1 VIEW  Comparison: 11/03/2010 and earlier.  Findings: New small to moderate bilateral pleural effusions and diffuse increased interstitial opacity.  Chronic severe cardiomegaly with superimposed moderate retrocardiac gastric hernia.  No pneumothorax.  IMPRESSION: Acute pulmonary edema with bilateral pleural effusions in the setting of chronic cardiomegaly.  Severe bilateral respiratory infection is a less likely consideration.   Original Report Authenticated By: Harley Hallmark, M.D.     Date: 09/28/2011  Rate: 95  Rhythm: normal sinus rhythm and premature ventricular contractions (PVC)  QRS Axis: normal  Intervals: normal  ST/T Wave abnormalities: nonspecific ST/T changes  Conduction Disutrbances:none  Narrative Interpretation: LVH, rate increased from prior  Old EKG Reviewed: changes noted    1. Pulmonary edema       MDM  78 year old female with flash pulmonary edema, no signs of infectious etiology. EKG without ST elevation. Patient oxygen levels stable on BiPAP, given breathing treatment. Patient denies any improvement after nebulized treatment. Patient requesting to have a trial off the BiPAP due to discomfort. She was slowly weaned from BiPAP to Ventimask to nasal cannula. Discussed the case with her husband, as well as cardiology. I am concerned of acute worsening of her mitral valve prolapse leading to her flash pulmonary edema. First a prominent has been negative. Discuss with cardiology for admission, and he wants transfer to [facility] to [facility] critical care unit.       Olivia Mackie, MD 09/28/11 1014

## 2011-09-28 NOTE — ED Notes (Signed)
Error in this EMTALA form, wrong pt

## 2011-09-28 NOTE — ED Notes (Signed)
Per Dr. Terressa Koyanagi pt may come off Venti mask and place on [place] at 3L if tolerated. Pt is now sitting up drinking coffee with no difficulty

## 2011-09-28 NOTE — Care Management Note (Signed)
    Page 1 of 1   09/28/2011     8:45:41 AM   CARE MANAGEMENT NOTE 09/28/2011  Patient:  Victoria Sexton, Victoria Sexton   Account Number:  [id number]  Date Initiated:  09/28/2011  Documentation initiated by:  Junius Creamer  Subjective/Objective Assessment:   adm w chf     Action/Plan:   lives w husband, pcp dr Aram Beecham white   Anticipated DC Date:     Anticipated DC Plan:        DC Planning Services  CM consult      Choice offered to / List presented to:             Status of service:   Medicare Important Message given?   (If response is "NO", the following Medicare IM given date fields will be blank) Date Medicare IM given:   Date Additional Medicare IM given:    Discharge Disposition:    Per UR Regulation:  Reviewed for med. necessity/level of care/duration of stay  If discussed at Long Length of Stay Meetings, dates discussed:    Comments:  9/24 8:44a debbie Edsel Shives rn,bsn [phone] will moniter for dc needs as pt progresses.

## 2011-09-28 NOTE — Progress Notes (Signed)
SUBJECTIVE:  Breathing better today  OBJECTIVE:   Vitals:   Filed Vitals:   09/28/11 0800 09/28/11 0815 09/28/11 0900 09/28/11 1000  BP: 120/57  133/57 110/55  Pulse: 81 74 87 87  Temp:  98.4 F (36.9 C)    TempSrc:  Oral    Resp: 29  31 28   Height:      Weight:      SpO2: 99% 99% 100% 93%   I&O's:   Intake/Output Summary (Last 24 hours) at 09/28/11 1015 Last data filed at 09/28/11 1000  Gross per 24 hour  Intake    503 ml  Output    351 ml  Net    152 ml   TELEMETRY: Reviewed telemetry pt in NSR     PHYSICAL EXAM General: Well developed, well nourished, in no acute distress Head: Eyes PERRLA, No xanthomas.   Normal cephalic and atramatic  Lungs:   Clear bilaterally to auscultation and percussion. Heart:   HRRR S1 S2 Pulses are 2+ & equal. Abdomen: Bowel sounds are positive, abdomen soft and non-tender without masses Extremities:   No clubbing, cyanosis or edema.  DP +1 Neuro: Alert and oriented X 3. Psych:  Good affect, responds appropriately   LABS: Basic Metabolic Panel:  Basename 09/28/11 0720 09/27/11 2340  NA -- 136  K -- 3.5  CL -- 100  CO2 -- 24  GLUCOSE -- 163*  BUN -- 9  CREATININE 0.50 0.57  CALCIUM -- 9.7  MG -- --  PHOS -- --   Liver Function Tests: No results found for this basename: AST:2,ALT:2,ALKPHOS:2,BILITOT:2,PROT:2,ALBUMIN:2 in the last 72 hours No results found for this basename: LIPASE:2,AMYLASE:2 in the last 72 hours CBC:  Basename 09/28/11 0720 09/27/11 2340  WBC 12.9* 12.2*  NEUTROABS -- 5.0  HGB 10.2* 11.5*  HCT 32.7* 37.0  MCV 76.2* 77.1*  PLT 194 243   Cardiac Enzymes:  Basename 09/27/11 2314  CKTOTAL --  CKMB --  CKMBINDEX --  TROPONINI <0.30   Coag Panel:   Lab Results  Component Value Date   INR 0.97 10/25/2010    RADIOLOGY: Dg Chest Port 1 View  09/27/2011  *RADIOLOGY REPORT*  Clinical Data: 78 year old female with respiratory stress.  PORTABLE CHEST - 1 VIEW  Comparison: 11/03/2010 and earlier.   Findings: New small to moderate bilateral pleural effusions and diffuse increased interstitial opacity.  Chronic severe cardiomegaly with superimposed moderate retrocardiac gastric hernia.  No pneumothorax.  IMPRESSION: Acute pulmonary edema with bilateral pleural effusions in the setting of chronic cardiomegaly.  Severe bilateral respiratory infection is a less likely consideration.   Original Report Authenticated By: Harley Hallmark, M.D.       ASSESSMENT:  1.  Acute pulmonary edema secondary to acute diastolic CHF from severe MR 2.  Severe MR with MVP 3.  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  HTN  PLAN:   1.  Continue IV Lasix - she currently is net positive with significant crackles on exam so will increase Lasix to 40mg  IV BID 2.  BMET in am 3.  CVTS consult - this is her first episode of acute CHF.  She had been treated with medical management for her MR in the past but now with new onset CHF we may be nearing time for MV repair  Quintella Reichert, MD  09/28/2011  10:15 AM

## 2011-09-28 NOTE — ED Notes (Signed)
Pt transferred to [facility] [id number] by [facility], pt in no distress, husband took pts 1bag of belongings with him

## 2011-09-29 ENCOUNTER — Encounter (HOSPITAL_COMMUNITY)
Admission: EM | Disposition: A | Discharge status: Home / Self Care | Payer: Self-pay | Source: Home / Self Care | Attending: Cardiology

## 2011-09-29 ENCOUNTER — Inpatient Hospital Stay (HOSPITAL_COMMUNITY): Payer: Medicare Other

## 2011-09-29 DIAGNOSIS — I501 Left ventricular failure: Secondary | ICD-10-CM | POA: Diagnosis not present

## 2011-09-29 DIAGNOSIS — I5031 Acute diastolic (congestive) heart failure: Secondary | ICD-10-CM | POA: Diagnosis not present

## 2011-09-29 DIAGNOSIS — I424 Endocardial fibroelastosis: Secondary | ICD-10-CM | POA: Diagnosis not present

## 2011-09-29 DIAGNOSIS — I059 Rheumatic mitral valve disease, unspecified: Secondary | ICD-10-CM | POA: Diagnosis not present

## 2011-09-29 DIAGNOSIS — I509 Heart failure, unspecified: Secondary | ICD-10-CM | POA: Diagnosis not present

## 2011-09-29 DIAGNOSIS — I2510 Atherosclerotic heart disease of native coronary artery without angina pectoris: Secondary | ICD-10-CM | POA: Diagnosis not present

## 2011-09-29 DIAGNOSIS — J9600 Acute respiratory failure, unspecified whether with hypoxia or hypercapnia: Secondary | ICD-10-CM | POA: Diagnosis not present

## 2011-09-29 HISTORY — PX: LEFT AND RIGHT HEART CATHETERIZATION WITH CORONARY ANGIOGRAM: SHX5449

## 2011-09-29 LAB — BASIC METABOLIC PANEL
CO2: 28 mEq/L (ref 19–32)
Calcium: 9.2 mg/dL (ref 8.4–10.5)
Chloride: 102 mEq/L (ref 96–112)
Creatinine, Ser: 0.53 mg/dL (ref 0.50–1.10)
Glucose, Bld: 105 mg/dL — ABNORMAL HIGH (ref 70–99)

## 2011-09-29 LAB — POCT I-STAT 3, ART BLOOD GAS (G3+)
Acid-Base Excess: 4 mmol/L — ABNORMAL HIGH (ref 0.0–2.0)
TCO2: 29 mmol/L (ref 0–100)
pH, Arterial: 7.463 — ABNORMAL HIGH (ref 7.350–7.450)

## 2011-09-29 LAB — POCT I-STAT 3, VENOUS BLOOD GAS (G3P V)
Bicarbonate: 30.6 mEq/L — ABNORMAL HIGH (ref 20.0–24.0)
pH, Ven: 7.431 — ABNORMAL HIGH (ref 7.250–7.300)
pO2, Ven: 33 mmHg (ref 30.0–45.0)

## 2011-09-29 LAB — PULMONARY FUNCTION TEST

## 2011-09-29 LAB — PROTIME-INR: INR: 0.93 (ref 0.00–1.49)

## 2011-09-29 SURGERY — LEFT AND RIGHT HEART CATHETERIZATION WITH CORONARY ANGIOGRAM
Anesthesia: LOCAL

## 2011-09-29 MED ORDER — SODIUM CHLORIDE 0.9 % IV SOLN: 1.0000 mL/kg/h | INTRAVENOUS | Status: AC

## 2011-09-29 MED ORDER — POTASSIUM CHLORIDE CRYS ER 20 MEQ PO TBCR
40.0000 meq | EXTENDED_RELEASE_TABLET | Freq: Two times a day (BID) | ORAL | Status: DC
  Administered 2011-09-29 (×2): 40 meq via ORAL
  Filled 2011-09-29 (×4): qty 2

## 2011-09-29 MED ORDER — ACETAMINOPHEN 325 MG PO TABS: 650.0000 mg | ORAL_TABLET | ORAL | Status: DC | PRN

## 2011-09-29 MED ORDER — LIDOCAINE HCL (PF) 1 % IJ SOLN
INTRAMUSCULAR | Status: AC
  Filled 2011-09-29: qty 30

## 2011-09-29 MED ORDER — FENTANYL CITRATE 0.05 MG/ML IJ SOLN
INTRAMUSCULAR | Status: AC
  Filled 2011-09-29: qty 2

## 2011-09-29 MED ORDER — DIAZEPAM 2 MG PO TABS
2.0000 mg | ORAL_TABLET | ORAL | Status: AC
  Administered 2011-09-29: 2 mg via ORAL
  Filled 2011-09-29: qty 1

## 2011-09-29 MED ORDER — SODIUM CHLORIDE 0.9 % IJ SOLN: 3.0000 mL | INTRAMUSCULAR | Status: DC | PRN

## 2011-09-29 MED ORDER — SODIUM CHLORIDE 0.9 % IJ SOLN: 3.0000 mL | Freq: Two times a day (BID) | INTRAMUSCULAR | Status: DC

## 2011-09-29 MED ORDER — SODIUM CHLORIDE 0.9 % IV SOLN: 250.0000 mL | INTRAVENOUS | Status: DC | PRN

## 2011-09-29 MED ORDER — ONDANSETRON HCL 4 MG/2ML IJ SOLN: 4.0000 mg | Freq: Four times a day (QID) | INTRAMUSCULAR | Status: DC | PRN

## 2011-09-29 MED ORDER — ASPIRIN 81 MG PO CHEW: 324.0000 mg | CHEWABLE_TABLET | ORAL | Status: DC

## 2011-09-29 MED ORDER — ASPIRIN 81 MG PO CHEW
324.0000 mg | CHEWABLE_TABLET | ORAL | Status: AC
  Administered 2011-09-29: 324 mg via ORAL
  Filled 2011-09-29: qty 4

## 2011-09-29 MED ORDER — NITROGLYCERIN 0.2 MG/ML ON CALL CATH LAB
INTRAVENOUS | Status: AC
  Filled 2011-09-29: qty 1

## 2011-09-29 NOTE — Interval H&P Note (Signed)
History and Physical Interval Note:  09/29/2011 2:51 PM  Victoria Sexton  has presented today for surgery, with the diagnosis of Chest pain  The various methods of treatment have been discussed with the patient and family. After consideration of risks, benefits and other options for treatment, the patient has consented to  Procedure(s) (LRB) with comments: LEFT AND RIGHT HEART CATHETERIZATION WITH CORONARY ANGIOGRAM (N/A) as a surgical intervention .  The patient's history has been reviewed, patient examined, no change in status, stable for surgery.  I have reviewed the patient's chart and labs.  Questions were answered to the patient's satisfaction.     Eulogio Requena

## 2011-09-29 NOTE — Clinical Documentation Improvement (Signed)
RESPIRATORY FAILURE DOCUMENTATION CLARIFICATION QUERY   THIS DOCUMENT IS NOT A PERMANENT PART OF THE MEDICAL RECORD   Please update your documentation within the medical record to reflect your response to this query.                                                                                     09/29/11  Dr. Anne Fu and/or Associates,  In a better effort to capture your patient's severity of illness, reflect appropriate length of stay and utilization of resources, a review of the patient medical record has revealed the following indicators:  "Per EMS pt in respiratory distress, o2 stat 81%ra, albuterol 5mg  neb tx attempted, pt unable to tolerate. enroute pt placed on CPAP with o2 sat increase to 85%. Pt is a/o x4" ED Nurse's Note   "78 year old female presents to emergency room with reported respiratory distress. Patient was in her normal state of health earlier in the day, was walking up the stairs when she became acutely short of breath. EMS reports upon their arrival patient was hypoxic with initial sats in the 80s. Patient was placed on CPAP by EMS, with improved sats to 100. She appears distressed Resp 28  Tachycardia  Pulmonary/Chest: No stridor. She is in respiratory distress. She has rales. She exhibits no tenderness.  Tachypnea and rales to two thirds of the way up bilaterally  She is diaphoretic She was slowly weaned from BiPAP to Ventimask to nasal cannula" Victoria Mackie, MD  09/28/11 1014 ED Note  "She was hypoxic on initial presentation requiring NIV and then high flow oxygen Lungs: Decreased breath sounds at bases, respirations labored" [place] C. Terressa Koyanagi, MD H&P   Based on your clinical judgment, please document in the progress notes and discharge summary if a condition below provides greater specificity regarding the patient's respiratory status:   - Acute Hypoxic Respiratory Failure, resolved   - Other Condition   - Unable to Clinically Determine   In  responding to this query please exercise your independent judgment.    The fact that a query is asked, does not imply that any particular answer is desired or expected.    Reviewed: 09/30/11 - "acute hypoxic resp failure, resolved" documented in pn 09/30/11 by Dr. Anne Fu. Mathis Dad RN  Thank You,  Jerral Ralph  RN BSN CCDS Certified Clinical Documentation Specialist: Ce..   [phone]  Health Information Management [place]   TO RESPOND TO THE THIS QUERY, FOLLOW THE INSTRUCTIONS BELOW:  If needed, update documentation for the patient's encounter via the notes activity.  Access this query again and click edit on the In [employer].  After updating, or not, click F2 to complete all highlighted (required) fields concerning your review. Select "additional documentation in the medical record" OR "no additional documentation provided".  Click Sign note button.  The deficiency will fall out of your In Basket *Please let us know if you are not able to complete this workflow by phone or e-mail (listed below).

## 2011-09-29 NOTE — Progress Notes (Signed)
Subjective:  Breathing is much improved. No CP  Objective:  Vital Signs in the last 24 hours: Temp:  [97.6 F (36.4 C)-98.7 F (37.1 C)] 97.8 F (36.6 C) (09/25 0726) Pulse Rate:  [76-92] 83  (09/25 0726) Resp:  [20-26] 20  (09/25 0500) BP: (87-136)/(42-70) 123/50 mmHg (09/25 0700) SpO2:  [92 %-97 %] 94 % (09/25 0726) Weight:  [42.4 kg (93 lb 7.6 oz)] 42.4 kg (93 lb 7.6 oz) (09/25 0500)  Intake/Output from previous day: 09/24 0701 - 09/25 0700 In: 1086 [P.O.:1075; I.V.:3; IV Piggyback:8] Out: 3950 [Urine:3950]   Physical Exam: General: Thin, elderly, in no acute distress. Head:  Normocephalic and atraumatic. Lungs: Clear to auscultation and percussion. Heart: Normal S1 and S2.  3/6 HSM apex,no rubs or gallops.  Abdomen: soft, non-tender, positive bowel sounds. Extremities: No clubbing or cyanosis. No edema. Neurologic: Alert and oriented x 3.    Lab Results:  Basename 09/28/11 0720 09/27/11 2340  WBC 12.9* 12.2*  HGB 10.2* 11.5*  PLT 194 243    Basename 09/29/11 0508 09/28/11 0720 09/27/11 2340  NA 140 -- 136  K 3.0* -- 3.5  CL 102 -- 100  CO2 28 -- 24  GLUCOSE 105* -- 163*  BUN 9 -- 9  CREATININE 0.53 0.50 --    Basename 09/27/11 2314  TROPONINI <0.30   Hepatic Function Panel No results found for this basename: PROT,ALBUMIN,AST,ALT,ALKPHOS,BILITOT,BILIDIR,IBILI in the last 72 hours No results found for this basename: CHOL in the last 72 hours No results found for this basename: PROTIME in the last 72 hours  Imaging: Dg Chest Port 1 View  09/27/2011  *RADIOLOGY REPORT*  Clinical Data: 78 year old female with respiratory stress.  PORTABLE CHEST - 1 VIEW  Comparison: 11/03/2010 and earlier.  Findings: New small to moderate bilateral pleural effusions and diffuse increased interstitial opacity.  Chronic severe cardiomegaly with superimposed moderate retrocardiac gastric hernia.  No pneumothorax.  IMPRESSION: Acute pulmonary edema with bilateral pleural  effusions in the setting of chronic cardiomegaly.  Severe bilateral respiratory infection is a less likely consideration.   Original Report Authenticated By: Harley Hallmark, M.D.    Personally viewed.   Telemetry: No adverse rhythms Personally viewed.    Cardiac Studies:  ECHO read P. Prior severe MR  Assessment/Plan:   78 year old with severe MR, acute heart failure.   -Appreciate Dr. Orvan July note -Hypokalemia 3.0 currently. Will replete.  -Will try to perform R and L cath later this afternoon, left groin -NPO -Holding lasix. -Risks and benefits of cath explained including CVA MI death.  -Lungs improved, able to lay flat.    Victoria Sexton 09/29/2011, 11:39 AM

## 2011-09-29 NOTE — Progress Notes (Signed)
Echocardiogram 2D Echocardiogram has been performed.  Valin Massie 09/29/2011, 11:21 AM

## 2011-09-29 NOTE — CV Procedure (Signed)
CARDIAC CATHETERIZATION  PROCEDURE:  Right and left heart catheterization with selective coronary angiography, left ventriculogram, cardiac outputs, selective oxygen saturations. Ascending aortogram, thoracic aortogram, abdominal aortogram  INDICATIONS:  78 year old with severe mitral regurgitation, recent acute heart failure exacerbation here for preoperative cardiac catheterization/anticipation of mitral valve repair.  The risks, benefits, and details of the procedure were explained to the patient, including stroke, heart attack, death, renal impairment, arterial damage, bleeding.  The patient verbalized understanding and wanted to proceed.  Informed written consent was obtained.  PROCEDURE TECHNIQUE:  After Xylocaine anesthesia and visualization of the femoral head via fluoroscopy, a 41F sheath was placed in the left femoral artery using the modified Seldinger technique.  A 7 French sheath was inserted into the left femoral vein using the modified Seldinger technique. Left coronary angiography was done using a Judkins L4 catheter.  Right coronary angiography was done using a Judkins R4 catheter. Multiple views with hand injection of Omnipaque were obtained. 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  Right heart catheterization was performed with a balloon tipped Swan-Ganz catheter traversing the right-sided heart structures to the wedge position. Oxygen saturation was drawn in the pulmonary artery as well as femoral artery. Hemodynamics were obtained. Cardiac outputs were obtained utilizing the Fick and thermodilution methods. Following the procedure, sheaths were removed and patient was hemodynamically stable. Ascending aortogram was performed/thoracic/descending aortogram utilizing the pigtail catheter each utilizing 20 mL of contrast.   CONTRAST:  Total of 110 ml.  FLOUROSCOPY TIME: 6.8 minutes.  COMPLICATIONS:  None.    HEMODYNAMICS:     Right atrium (RA): 2/0 mmHg Right ventricle (RV): 18/0/2 mmHg Pulmonary artery (PA): 21/6/12 mmHg Pulmonary capillary wedge pressure (PCWP): 5/2/3 mmHg  Cardiac output: Fick 4.04 liters/min Cardiac index: Fick 2.99  PA saturation: 64 % FA saturation: 96 %   Aortic pressure: 105/46/69 mmHg LV pressure: not done.       ANGIOGRAPHIC DATA:    Left main: Distal heavily calcified, 30-40% narrowing.  Left anterior descending (LAD): Severe ostial stenosis, heavily calcified, 90%. Severely calcified arterial tree, significant LAD disease at the bifurcation of large first diagonal branch. Large mid diagonal branch with significant stenosis proximally of up to 80%. Once again heavily diseased artery.  Circumflex artery (CIRC): Severely calcified vessel, proximal lengthy stenosis of 70-80%. 90% stenosis at the bifurcation of first obtuse marginal branch. There significant collateral vessels traversing from the circumflex distribution to supply the entire right coronary artery up to the ostium.  Right coronary artery (RCA): Occluded ostially. The vessel however has excellent left to right collaterals filling the entire vessel. The right coronary artery is heavily calcified and there appears to be significant stenosis in the mid section of up to 90%.  Aortogram: Ascending aortogram demonstrates aortic arch calcification but no significant aneurysmal segments. The thoracic aorta is tortuous and mildly calcified. The descending aorta demonstrates a fusiform abdominal aortic aneurysm, infrarenal, quite small in caliber. There is moderate disease throughout the iliac vessels bilaterally. There appears to be significant tubular stenosis in the common iliac vessel on the right of 80%. Mild bilateral renal artery disease.     IMPRESSIONS:  1. Severe multivessel coronary artery disease, proximal LAD, proximal circumflex, ostial right coronary artery with left to right collaterals in severely  diseased/calcified vessels.  2. 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 3. Normal right-sided heart pressures. 4. Normal cardiac output. 5.  Moderately calcified  aortic arch, mildly calcified descending thoracic aorta. 6.  Infrarenal abdominal aortic aneurysm, relatively small in size. 7.  Severe peripheral vascular disease with 80-90% common iliac stenosis on the right. 8.  Mild bilateral renal artery disease.  RECOMMENDATION:  I will discuss further with Dr. Cornelius Moras.

## 2011-09-30 DIAGNOSIS — I2510 Atherosclerotic heart disease of native coronary artery without angina pectoris: Secondary | ICD-10-CM | POA: Diagnosis not present

## 2011-09-30 DIAGNOSIS — I059 Rheumatic mitral valve disease, unspecified: Secondary | ICD-10-CM | POA: Diagnosis not present

## 2011-09-30 DIAGNOSIS — I501 Left ventricular failure: Secondary | ICD-10-CM | POA: Diagnosis not present

## 2011-09-30 LAB — BASIC METABOLIC PANEL
BUN: 13 mg/dL (ref 6–23)
Chloride: 105 mEq/L (ref 96–112)
GFR calc Af Amer: >90 mL/min (ref >90)
GFR calc non Af Amer: 88 mL/min — ABNORMAL LOW (ref >90)
Potassium: 4.2 mEq/L (ref 3.5–5.1)
Sodium: 140 mEq/L (ref 135–145)

## 2011-09-30 MED ORDER — METOPROLOL TARTRATE 25 MG PO TABS: 12.5000 mg | ORAL_TABLET | Freq: Two times a day (BID) | ORAL | Status: DC

## 2011-09-30 NOTE — Progress Notes (Signed)
Pt discharged home with husband. All discharge information given with one written prescription of Ntg and one prescription of Lopressor sent to pharmacy. Pt and husband instructed to pick up medications at [facility]. Pt denies SOB, CP, or any other complaints. Ambulates without difficulty. All belongings with pt.Pt and husband verbalize understanding of all discharge instructions and follow up appts.

## 2011-09-30 NOTE — Discharge Summary (Signed)
Patient ID: Victoria Sexton MRN: [id number] DOB/AGE: 76-Feb-1929 76 y.o.  Admit date: 09/27/2011 Discharge date: 09/30/2011  Primary Discharge Diagnosis: Acute heart failure/mitral regurgitation Secondary Discharge Diagnosis: Severe coronary artery disease, low body weight, papillary fibroblastoma left ventricular outflow tract  Significant Diagnostic Studies: Cardiac catheterization 10/04/11 with severe triple-vessel coronary artery disease. Please see catheterization report for details.  CARDIAC CATHETERIZATION  PROCEDURE: Right and left heart catheterization with selective coronary angiography, left ventriculogram, cardiac outputs, selective oxygen saturations. Ascending aortogram, thoracic aortogram, abdominal aortogram  INDICATIONS: 78 year old with severe mitral regurgitation, recent acute heart failure exacerbation here for preoperative cardiac catheterization/anticipation of mitral valve repair.  The risks, benefits, and details of the procedure were explained to the patient, including stroke, heart attack, death, renal impairment, arterial damage, bleeding. The patient verbalized understanding and wanted to proceed. Informed written consent was obtained.  PROCEDURE TECHNIQUE: After Xylocaine anesthesia and visualization of the femoral head via fluoroscopy, a 85F sheath was placed in the left femoral artery using the modified Seldinger technique. A 7 French sheath was inserted into the left femoral vein using the modified Seldinger technique. Left coronary angiography was done using a Judkins L4 catheter. Right coronary angiography was done using a Judkins R4 catheter. Multiple views with hand injection of Omnipaque were obtained. 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 Right heart catheterization was performed with a balloon tipped Swan-Ganz catheter traversing the right-sided heart structures to the wedge position. Oxygen saturation was drawn in  the pulmonary artery as well as femoral artery. Hemodynamics were obtained. Cardiac outputs were obtained utilizing the Fick and thermodilution methods. Following the procedure, sheaths were removed and patient was hemodynamically stable. Ascending aortogram was performed/thoracic/descending aortogram utilizing the pigtail catheter each utilizing 20 mL of contrast.  CONTRAST: Total of 110 ml.  FLOUROSCOPY TIME: 6.8 minutes.  COMPLICATIONS: None.  HEMODYNAMICS:  Right atrium (RA): 2/0 mmHg  Right ventricle (RV): 18/0/2 mmHg  Pulmonary artery (PA): 21/6/12 mmHg  Pulmonary capillary wedge pressure (PCWP): 5/2/3 mmHg  Cardiac output: Fick 4.04 liters/min  Cardiac index: Fick 2.99  PA saturation: 64 %  FA saturation: 96 %  Aortic pressure: 105/46/69 mmHg  LV pressure: not done.  ANGIOGRAPHIC DATA:  Left main: Distal heavily calcified, 30-40% narrowing.  Left anterior descending (LAD): Severe ostial stenosis, heavily calcified, 90%. Severely calcified arterial tree, significant LAD disease at the bifurcation of large first diagonal branch. Large mid diagonal branch with significant stenosis proximally of up to 80%. Once again heavily diseased artery.  Circumflex artery (CIRC): Severely calcified vessel, proximal lengthy stenosis of 70-80%. 90% stenosis at the bifurcation of first obtuse marginal branch. There significant collateral vessels traversing from the circumflex distribution to supply the entire right coronary artery up to the ostium.  Right coronary artery (RCA): Occluded ostially. The vessel however has excellent left to right collaterals filling the entire vessel. The right coronary artery is heavily calcified and there appears to be significant stenosis in the mid section of up to 90%.  Aortogram: Ascending aortogram demonstrates aortic arch calcification but no significant aneurysmal segments. The thoracic aorta is tortuous and mildly calcified. The descending aorta demonstrates a fusiform  abdominal aortic aneurysm, infrarenal, quite small in caliber. There is moderate disease throughout the iliac vessels bilaterally. There appears to be significant tubular stenosis in the common iliac vessel on the right of 80%. Mild bilateral renal artery disease.  IMPRESSIONS:  1. Severe multivessel coronary artery disease, proximal LAD, proximal circumflex, ostial right coronary  artery with left to right collaterals in severely diseased/calcified vessels.  2. 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 3. Normal right-sided heart pressures. 4. Normal cardiac output. 5. Moderately calcified aortic arch, mildly calcified descending thoracic aorta.  6. Infrarenal abdominal aortic aneurysm, relatively small in size.  7. Severe peripheral vascular disease with 80-90% common iliac stenosis on the right.  8. Mild bilateral renal artery disease.   Echocardiogram 09/29/11 - Left ventricle: The cavity size was normal. There was moderate concentric hypertrophy. Systolic function was normal. The estimated ejection fraction was in the range of 55% to 60%. Wall motion was asynchronous consistent with bundle branch block; there were no regional wall motion abnormalities. 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 No change. - Mitral valve: Calcified annulus. Moderately thickened leaflets with anterior prolapse. Moderate to severe regurgitation directed eccentrically wrapping around dilated left atrium - Left atrium: The atrium was moderately dilated.  Consults: Cardiothoracic surgery, Dr. Cornelius Moras.  Hospital Course: 78 year old female with previously described severe mitral regurgitation being followed clinically and was admitted with acute shortness of breath, hypoxic respiratory failure which responded rapidly with IV diuresis. Dr. Cornelius Moras was consulted to evaluate for surgery. He has seen  her in the past. We have been following clinically. She also has a small papillary fibroblastoma in the left ventricular outflow tract.  Cardiac catheterization took place as part of preoperative workup and demonstrated severe multivessel coronary artery disease. Please see above catheterization report. Overall ejection fraction is normal per echocardiogram.  Had lengthy discussion as well as correspondence with Dr. Cornelius Moras. She has been feeling well since cardiac catheterization and is eager to go home prior to surgery possibly end of next week.  As per prior discussion, I expressed my concern over her increased cardiovascular risk given her severe coronary artery disease. Both she and her husband understand. They wish however to be able to go home to spend time with family and pets prior to upcoming surgery. I will respect their wishes.  They know to contact immediately if any further worrisome symptoms develop. We will set her up for office visit with Dr. Cornelius Moras on Monday.  Discharge Exam: Blood pressure 121/85, pulse 73, temperature 98 F (36.7 C), temperature source Oral, resp. rate 16, height 5' (1.524 m), weight 40.9 kg (90 lb 2.7 oz), SpO2 95.00%.    General: Thin, elderly, in no acute distress.  Head: Normocephalic and atraumatic.  Lungs: Clear to auscultation and percussion.  Heart: Normal S1 and S2. 3/6 S murmur, rubs or gallops.  Abdomen: soft, non-tender, positive bowel sounds.  Extremities: No clubbing or cyanosis. No edema. Thin, cath site L, C/D/I  Neurologic: Alert and oriented x 3.  Labs:   Lab Results  Component Value Date   WBC 12.9* 09/28/2011   HGB 10.2* 09/28/2011   HCT 32.7* 09/28/2011   MCV 76.2* 09/28/2011   PLT 194 09/28/2011    Lab 09/30/11 0501  NA 140  K 4.2  CL 105  CO2 25  BUN 13  CREATININE 0.50  CALCIUM 9.4  PROT --  BILITOT --  ALKPHOS --  ALT --  AST --  GLUCOSE 93   Lab Results  Component Value Date   CKTOTAL 77 10/25/2010   CKMB 2.7  10/25/2010   TROPONINI <0.30 09/27/2011       FOLLOW UP PLANS AND APPOINTMENTS Discharge Orders    Future Appointments: Provider: Department: Dept Phone: Center:  10/04/2011 4:30 PM Purcell Nails, MD [facility] [phone] [facility]   05/08/2012 11:00 AM Purcell Nails, MD [facility] [phone] [facility]     Future Orders Please Complete By Expires   Diet - low sodium heart healthy      Increase activity slowly          Medication List     As of 09/30/2011  1:25 PM    TAKE these medications         amLODipine 5 MG tablet   Commonly known as: NORVASC   Take 5 mg by mouth daily.      aspirin 81 MG tablet   Take 81 mg by mouth daily.      CALCIUM CITRATE + D3 PO   Take 630 mg by mouth.      co-enzyme Q-10 30 MG capsule   Take 30 mg by mouth daily.      Hernia Support Right Small Misc   1 Device by Does not apply route daily.      KRILL OIL PO   Take 300 mg by mouth.      metoprolol tartrate 25 MG tablet   Commonly known as: LOPRESSOR   Take 0.5 tablets (12.5 mg total) by mouth 2 (two) times daily.      multivitamin per tablet   Take 1 tablet by mouth daily.      multivitamin-lutein Caps   Take 1 capsule by mouth daily.      vitamin E 400 UNIT capsule   Take 400 Units by mouth daily.           Follow-up Information    Follow up with Purcell Nails, MD. On 10/04/2011. (Monday 4:30pm)    Contact information:   [address] [place] [address] [phone]         I've given her a prescription for sublingual nitroglycerin 0.4 mg.  BRING ALL MEDICATIONS WITH YOU TO FOLLOW UP APPOINTMENTS  Time spent with patient to include physician time:40 minutes spent with patient earlier this morning for education, review of condition, discussion with husband, nurse, patient. Correspondence with Dr. Cornelius Moras. SignedDonato Schultz 09/30/2011, 1:25 PM

## 2011-09-30 NOTE — Progress Notes (Signed)
Subjective:  No pain or SOB overnight. Leg site c/d/i.   Objective:  Vital Signs in the last 24 hours: Temp:  [97.8 F (36.6 C)-98.4 F (36.9 C)] 97.8 F (36.6 C) (09/26 0400) Pulse Rate:  [72-73] 73  (09/25 1431) Resp:  [16] 16  (09/25 2000) BP: (92-139)/(43-64) 93/47 mmHg (09/26 0500) SpO2:  [94 %-95 %] 95 % (09/26 0400) Weight:  [40.9 kg (90 lb 2.7 oz)] 40.9 kg (90 lb 2.7 oz) (09/26 0500)  Intake/Output from previous day: 09/25 0701 - 09/26 0700 In: 265 [I.V.:265] Out: 1001 [Urine:1000; Stool:1]   Physical Exam: General: Thin, elderly, in no acute distress. Head:  Normocephalic and atraumatic. Lungs: Clear to auscultation and percussion. Heart: Normal S1 and S2.  3/6 S murmur, rubs or gallops.  Abdomen: soft, non-tender, positive bowel sounds. Extremities: No clubbing or cyanosis. No edema. Thin, cath site L, C/D/I Neurologic: Alert and oriented x 3.    Lab Results:  Basename 09/28/11 0720 09/27/11 2340  WBC 12.9* 12.2*  HGB 10.2* 11.5*  PLT 194 243    Basename 09/30/11 0501 09/29/11 0508  NA 140 140  K 4.2 3.0*  CL 105 102  CO2 25 28  GLUCOSE 93 105*  BUN 13 9  CREATININE 0.50 0.53    Basename 09/27/11 2314  TROPONINI <0.30     Telemetry: No adverse arrhythmias Personally viewed.     Cardiac Studies:  Cardiac catheterization revealed severe coronary artery disease, infrarenal renal abdominal aortic aneurysm.  Assessment/Plan:   78 year old female who was admitted with acute hypoxic respiratory failure now resolved in the setting of severe mitral regurgitation who responded very well to diuresis, ended up having cardiac catheterization for preoperative workup which revealed severe coronary artery disease, multivessel.  - Had a lengthy discussion with she and her husband both yesterday and today. I'm concerned about the severity of her coronary artery disease to explain to them that this process has been present and likely progressing for several  years. She is a former smoker. They expressed wishes to be able to go home to be with her family, pets. I was able to communicate with Dr. Cornelius Moras whose preference also was for her to be home with possible prior to surgery. She has not had any further anginal symptoms. No shortness of breath. I expressed that there is potential for cardiovascular mortality with the severity of her coronary artery disease. She understands. Husband is advocating that she is able to go home prior to surgery. I will have her ambulate today see how her symptoms are. If she is doing well, I will respect their wishes and allow her to go home with close followup with Dr. Cornelius Moras on Monday. Surgery will be likely scheduled for later next week.      SKAINS, MARK 09/30/2011, 8:28 AM

## 2011-10-04 ENCOUNTER — Encounter: Payer: Self-pay | Admitting: Thoracic Surgery (Cardiothoracic Vascular Surgery)

## 2011-10-04 ENCOUNTER — Ambulatory Visit (INDEPENDENT_AMBULATORY_CARE_PROVIDER_SITE_OTHER): Payer: Medicare Other | Admitting: Thoracic Surgery (Cardiothoracic Vascular Surgery)

## 2011-10-04 VITALS — BP 116/69 | HR 80 | Resp 18 | Ht 65.0 in | Wt 100.0 lb

## 2011-10-04 DIAGNOSIS — I059 Rheumatic mitral valve disease, unspecified: Secondary | ICD-10-CM

## 2011-10-04 DIAGNOSIS — I2510 Atherosclerotic heart disease of native coronary artery without angina pectoris: Secondary | ICD-10-CM | POA: Diagnosis not present

## 2011-10-04 DIAGNOSIS — I509 Heart failure, unspecified: Secondary | ICD-10-CM | POA: Diagnosis not present

## 2011-10-04 DIAGNOSIS — I340 Nonrheumatic mitral (valve) insufficiency: Secondary | ICD-10-CM

## 2011-10-04 HISTORY — DX: Atherosclerotic heart disease of native coronary artery without angina pectoris: I25.10

## 2011-10-04 HISTORY — DX: Heart failure, unspecified: I50.9

## 2011-10-04 LAB — CULTURE, BLOOD (ROUTINE X 2)

## 2011-10-04 MED ORDER — AMIODARONE HCL 200 MG PO TABS: 200.0000 mg | ORAL_TABLET | Freq: Two times a day (BID) | ORAL | Status: DC

## 2011-10-04 NOTE — Patient Instructions (Signed)
Begin amiodarone 200 mg by mouth twice daily and stop taking krill oil between now and surgery

## 2011-10-04 NOTE — Progress Notes (Signed)
[address]            [place] [address]          [phone]     CARDIOTHORACIC SURGERY OFFICE NOTE  Referring Provider is Donato Schultz, MD PCP is Cala Bradford, MD   HPI:  Patient returns to the office today for followup of mitral valve prolapse with severe mitral regurgitation. She was hospitalized last week with acute congestive heart failure, and I saw her in consultation on 09/28/2011.  She underwent left and right heart catheterization the following day by Dr. Anne Fu and was found to have severe three-vessel coronary artery disease. The patient was discharged from the hospital in returns to the office today to discuss the results of her cardiac catheterization and possibly make final plans for elective surgical intervention. She reports that her reading has remained stable since hospital discharge. She has not had any chest pain.   Current Outpatient Prescriptions  Medication Sig Dispense Refill  . amLODipine (NORVASC) 5 MG tablet Take 5 mg by mouth daily.        [name] aspirin 81 MG tablet Take 81 mg by mouth daily.        . Calcium Citrate-Vitamin D (CALCIUM CITRATE + D3 PO) Take 630 mg by mouth.      . co-enzyme Q-10 30 MG capsule Take 30 mg by mouth daily.       Clinical research associate Bandages & Supports (HERNIA SUPPORT RIGHT SMALL) MISC 1 Device by Does not apply route daily.  5 each  1  . KRILL OIL PO Take 300 mg by mouth.      . metoprolol tartrate (LOPRESSOR) 25 MG tablet Take 0.5 tablets (12.5 mg total) by mouth 2 (two) times daily.  30 tablet  12  . multivitamin (THERAGRAN) per tablet Take 1 tablet by mouth daily.        . multivitamin-lutein (OCUVITE-LUTEIN) CAPS Take 1 capsule by mouth daily.        . vitamin E 400 UNIT capsule Take 400 Units by mouth daily.          Physical Exam:   BP 116/69  Pulse 80  Resp 18  Ht 5\' 5"  (1.651 m)  Wt 100 lb (45.36 kg)  BMI 16.64 kg/m2  SpO2 98%  General:  Elderly and frail but  well-appearing  Chest:   Clear to auscultation with a few basilar crackles  CV:   Regular rate and rhythm with prominent grade 4/6 holosystolic murmur  Incisions:  n/a  Abdomen:  Soft and nontender  Extremities:  Warm and well-perfused  Diagnostic Tests:  Transthoracic Echocardiography  (Report amended )  Patient: Victoria Sexton, Victoria Sexton MR #: [id number] Study Date: 09/29/2011 Gender: F Age: 65 Height: 152.4cm Weight: 42.4kg BSA: 1.73m^2 Pt. Status: Room: 2908  [facility] Cardiology, Ec Rutherford Limerick MD SONOGRAPHER Cathie Beams ATTENDING Donato Schultz cc:  ------------------------------------------------------------ LV EF: 55% - 60%  ------------------------------------------------------------ Indications: Previous study (TEE) 11/13/2010. Mitral regurgitation 424.0.  ------------------------------------------------------------ History: PMH: Cardiac tumor (ventricular). Right inguinal hernia. Dyspnea. Risk factors: Hypertension.  ------------------------------------------------------------ Study Conclusions  - Left ventricle: The cavity size was normal. There was moderate concentric hypertrophy. Systolic function was normal. The estimated ejection fraction was in the range of 55% to 60%. Wall motion was asynchronous consistent with bundle branch block; there were no regional wall motion abnormalities. 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 No change. - Mitral valve: Calcified annulus. Moderately thickened leaflets with anterior prolapse. Moderate to severe regurgitation directed eccentrically wrapping around dilated left atrium - Left atrium: The atrium was moderately dilated. Transthoracic echocardiography. M-mode, complete 2D, spectral Doppler, and color Doppler. Height: Height: 152.4cm. Height: 60in. Weight: Weight: 42.4kg. Weight: 93.3lb. Body mass index: BMI: 18.3kg/m^2. Body surface area: BSA: 1.41m^2. Blood  pressure: 123/50. Patient status: Inpatient. Location: ICU/CCU  ------------------------------------------------------------  ------------------------------------------------------------ Left ventricle: The cavity size was normal. There was moderate concentric hypertrophy. Systolic function was normal. The estimated ejection fraction was in the range of 55% to 60%. Wall motion was asynchronous consistent with bundle branch block; there were no regional wall motion abnormalities. There was no evidence of elevated ventricular filling pressure by Doppler parameters. 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 No change.  ------------------------------------------------------------ Aortic valve: Trileaflet; mildly thickened, mildly calcified leaflets. Cusp separation was normal. Doppler: Transvalvular velocity was minimally increased. There was no significant stenosis. No regurgitation. VTI ratio of LVOT to aortic valve: 0.51. Valve area: 1.02cm^2(VTI). Indexed valve area: 0.76cm^2/m^2 (VTI). Peak velocity ratio of LVOT to aortic valve: 0.52. Valve area: 1.05cm^2 (Vmax). Indexed valve area: 0.79cm^2/m^2 (Vmax). Mean velocity ratio of LVOT to aortic valve: 0.51. Valve area: 1.02cm^2 (Vmean). Indexed valve area: 0.76cm^2/m^2 (Vmean). Mean gradient: 8mm Hg (S). Peak gradient: 16mm Hg (S).  ------------------------------------------------------------ Aorta: The aorta was not dilated, mildly calcified, and non-diseased.  ------------------------------------------------------------ Mitral valve: Calcified annulus. Moderately thickened leaflets with anterior prolapse. Doppler: Moderate to severe regurgitation directed eccentrically wrapping around dilated left atrium Peak gradient: 4mm Hg (D).  ------------------------------------------------------------ Left atrium: The atrium was moderately  dilated.  ------------------------------------------------------------ Right ventricle: The cavity size was normal. Wall thickness was normal. Systolic function was normal.  ------------------------------------------------------------ Pulmonic valve: Structurally normal valve. Cusp separation was normal. Doppler: Transvalvular velocity was within the normal range. No regurgitation.  ------------------------------------------------------------ Tricuspid valve: Structurally normal valve. Leaflet separation was normal. Doppler: No regurgitation.  ------------------------------------------------------------ Pulmonary artery: The main pulmonary artery was normal-sized.  ------------------------------------------------------------ Right atrium: The atrium was normal in size.  ------------------------------------------------------------ Pericardium: The pericardium was normal in appearance. There was no pericardial effusion.  ------------------------------------------------------------ Systemic veins: Inferior vena cava: The vessel was normal in size; the respirophasic diameter changes were in the normal range (= 50%); findings are consistent with normal central venous pressure.  ------------------------------------------------------------ Post procedure conclusions Ascending Aorta:  - The aorta was not dilated, mildly calcified, and non-diseased.  ------------------------------------------------------------  2D measurements Normal Doppler measurements Normal Left ventricle LVOT LVID ED, 31.5 mm 43-52 Peak vel, 104 cm/s ------ chord, S PLAX Mean vel, 67.5 cm/s ------ LVID ES, 24.5 mm 23-38 S chord, VTI, S 19.2 cm ------ PLAX Aortic valve FS, chord, 22 % >29 Peak vel, 200 cm/s ------ PLAX S LVPW, ED 12.5 mm ------ Mean vel, 133 cm/s ------ IVS/LVPW 1.16 <1.3 S ratio, ED VTI, S 37.9 cm ------ Ventricular septum Mean 8 mm Hg ------ IVS, ED 14.5 mm ------ gradient, LVOT  S Diam, S 16 mm ------ Peak 16 mm Hg ------ Area 2.01 cm^2 ------ gradient, Aorta S Root diam, 27 mm ------ VTI ratio 0.51 ------ ED LVOT/AV Left atrium Area, VTI 1.02 cm^2 ------ AP dim 48 mm ------ Area index 0.76 cm^2/m ------ AP dim 3.6 cm/m^2 <2.2 (VTI) ^2 index Peak vel 0.52 ------ ratio, LVOT/AV Area, Vmax 1.05 cm^2 ------ Area index 0.79 cm^2/m ------ (Vmax) ^2 Vmean 0.51 ------ ratio LVOT/AV Area, 1.02 cm^2 ------ Vmean Area  index 0.76 cm^2/m ------ (Vmean) ^2 Mitral valve Peak E vel 104 cm/s ------ Peak A vel 119 cm/s ------ Decelerati 201 ms 150-23 on time 0 Peak 4 mm Hg ------ gradient, D Peak E/A 0.9 ------ ratio Regurg 38.5 cm/s ------ alias vel, PISA Regurg 6 mm ------ radius, PISA Max regurg 594 cm/s ------ vel Regurg VTI 169 cm ------ ERO, PISA 0.15 cm^2 ------ Regurg 25 ml ------ vol, PISA Right ventricle Sa vel, 10.5 cm/s ------ lat ann, tiss DP  ------------------------------------------------------------ Mikle Bosworth, Mark 2013-09-25T14:41:25.750    CARDIAC CATHETERIZATION  PROCEDURE: Right and left heart catheterization with selective coronary angiography, left ventriculogram, cardiac outputs, selective oxygen saturations. Ascending aortogram, thoracic aortogram, abdominal aortogram  INDICATIONS: 78 year old with severe mitral regurgitation, recent acute heart failure exacerbation here for preoperative cardiac catheterization/anticipation of mitral valve repair.  The risks, benefits, and details of the procedure were explained to the patient, including stroke, heart attack, death, renal impairment, arterial damage, bleeding. The patient verbalized understanding and wanted to proceed. Informed written consent was obtained.  PROCEDURE TECHNIQUE: After Xylocaine anesthesia and visualization of the femoral head via fluoroscopy, a 78F sheath was placed in the left femoral artery using the modified Seldinger technique. A 7 French sheath  was inserted into the left femoral vein using the modified Seldinger technique. Left coronary angiography was done using a Judkins L4 catheter. Right coronary angiography was done using a Judkins R4 catheter. Multiple views with hand injection of Omnipaque were obtained. 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 Right heart catheterization was performed with a balloon tipped Swan-Ganz catheter traversing the right-sided heart structures to the wedge position. Oxygen saturation was drawn in the pulmonary artery as well as femoral artery. Hemodynamics were obtained. Cardiac outputs were obtained utilizing the Fick and thermodilution methods. Following the procedure, sheaths were removed and patient was hemodynamically stable. Ascending aortogram was performed/thoracic/descending aortogram utilizing the pigtail catheter each utilizing 20 mL of contrast.  CONTRAST: Total of 110 ml.  FLOUROSCOPY TIME: 6.8 minutes.  COMPLICATIONS: None.  HEMODYNAMICS:  Right atrium (RA): 2/0 mmHg  Right ventricle (RV): 18/0/2 mmHg  Pulmonary artery (PA): 21/6/12 mmHg  Pulmonary capillary wedge pressure (PCWP): 5/2/3 mmHg  Cardiac output: Fick 4.04 liters/min  Cardiac index: Fick 2.99  PA saturation: 64 %  FA saturation: 96 %  Aortic pressure: 105/46/69 mmHg  LV pressure: not done.  ANGIOGRAPHIC DATA:  Left main: Distal heavily calcified, 30-40% narrowing.  Left anterior descending (LAD): Severe ostial stenosis, heavily calcified, 90%. Severely calcified arterial tree, significant LAD disease at the bifurcation of large first diagonal branch. Large mid diagonal branch with significant stenosis proximally of up to 80%. Once again heavily diseased artery.  Circumflex artery (CIRC): Severely calcified vessel, proximal lengthy stenosis of 70-80%. 90% stenosis at the bifurcation of first obtuse marginal branch. There significant collateral vessels traversing from the  circumflex distribution to supply the entire right coronary artery up to the ostium.  Right coronary artery (RCA): Occluded ostially. The vessel however has excellent left to right collaterals filling the entire vessel. The right coronary artery is heavily calcified and there appears to be significant stenosis in the mid section of up to 90%.  Aortogram: Ascending aortogram demonstrates aortic arch calcification but no significant aneurysmal segments. The thoracic aorta is tortuous and mildly calcified. The descending aorta demonstrates a fusiform abdominal aortic aneurysm, infrarenal, quite small in caliber. There is moderate disease throughout the iliac vessels bilaterally. There appears to be significant  tubular stenosis in the common iliac vessel on the right of 80%. Mild bilateral renal artery disease.  IMPRESSIONS:  1. Severe multivessel coronary artery disease, proximal LAD, proximal circumflex, ostial right coronary artery with left to right collaterals in severely diseased/calcified vessels.  2. 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 3. Normal right-sided heart pressures. 4. Normal cardiac output. 5. Moderately calcified aortic arch, mildly calcified descending thoracic aorta.  6. Infrarenal abdominal aortic aneurysm, relatively small in size.  7. Severe peripheral vascular disease with 80-90% common iliac stenosis on the right.  8. Mild bilateral renal artery disease.     Noninvasive Vascular Lab  Preoperative Vascular Evaluation  Patient: Jaanvi, Fizer MR #: [id number] Study Date: 09/28/2011 Gender: F Age: 78 Height: Weight: BSA: Pt. Status: Room: 2908  Rutherford Limerick MD REFERRING Tressie Stalker MD ADMITTING Donato Schultz ATTENDING Donato Schultz SONOGRAPHER Farrel Demark, RVT, RDMS Reports also to:  ------------------------------------------------------------ History and  indications:  Indications  424.0 Mitral valve diseasea. V72.81 Screening Pre-operative. History  Pre-op evaluation  ------------------------------------------------------------ Study information:  Study status: Routine. Procedure: A vascular evaluation was performed. Image quality was adequate. Preoperative vascular evaluation for heart surgery. Carotid duplex exam per standard extablished protocols, limited upper extremity arterial evaluation and ABIs as indicated. Location: Bedside. Patient status: Inpatient.  Arterial flow:  +-------------------+--------+-------+--------------------+ Location V sys V ed Comment  +-------------------+--------+-------+--------------------+ Right CCA - 73cm/s 17cm/s Mild intimal  proximal   thickening  +-------------------+--------+-------+--------------------+ Right CCA - distal 80cm/s 17cm/s Mild intimal     thickening  +-------------------+--------+-------+--------------------+ Right ECA 112cm/s 24cm/s Mild heterogenous     plaque  +-------------------+--------+-------+--------------------+ Right ICA - -195cm/s-30cm/sModerate calcific  proximal   plaque with acoustic    shadowing  +-------------------+--------+-------+--------------------+ Right ICA - distal -59cm/s -13cm/s-------------------- +-------------------+--------+-------+--------------------+ Right vertebral 38cm/s 9cm/s -------------------- +-------------------+--------+-------+--------------------+ Left CCA - proximal116cm/s 16cm/s Mild intimal     thickening  +-------------------+--------+-------+--------------------+ Left CCA - distal -101cm/s-19cm/sMild intimal     thickening  +-------------------+--------+-------+--------------------+ Left ECA -212cm/s-23cm/sMild irregular     heterogenous plaque  +-------------------+--------+-------+--------------------+ Left ICA -  proximal-154cm/s-22cm/sModerate calcific     plaque with acoustic    shadowing  +-------------------+--------+-------+--------------------+ Left ICA - distal -52cm/s -6cm/s -------------------- +-------------------+--------+-------+--------------------+ Left vertebral 85cm/s 8cm/s -------------------- +-------------------+--------+-------+--------------------+  ------------------------------------------------------------ Summary:  - Bilaterally 40-59% internal carotid artery stenosis. Antegrade vertebral flow. - Right ICA / CCA ratio= 2.44. Left ICA / CCA ratio= 1.52. Prepared and Electronically Authenticated by  Durene Cal 2013-09-26T15:56:35.183   Impression:  The patient has long-standing mitral valve prolapse with likely flail segment of the posterior leaflet of the mitral valve and severe mitral regurgitation. Left ventricular function is only mildly reduced with ejection fraction estimated 55%.  The patient also has severe three-vessel coronary artery disease with high-grade stenosis of the left anterior descending coronary artery and the left circumflex coronary artery as well as chronic occlusion of the right coronary artery. Pulmonary artery pressures were not elevated at the time of catheterization.  The patient describes a long-standing gradual progression of symptoms of fatigue followed by acute exacerbation of resting shortness of breath and chest tightness consistent with class IV congestive heart failure and unstable angina pectoris for which she was hospitalized last week. She is now stable on medical therapy. 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  Risks associated with elective mitral valve repair and coronary artery bypass grafting will be elevated  because of the patient's advanced age and frail underlying physical  condition.  Plan:  The rationale for elective mitral valve repair and coronary artery bypass surgery has been explained, including a comparison between surgery and continued medical therapy with close follow-up.  The likelihood of successful and durable valve repair has been discussed with particular reference to the findings of their recent echocardiogram.  Based upon these findings and previous experience, I have quoted them a greater than 90 percent likelihood of successful valve repair.  In the unlikely event that their valve cannot be successfully repaired, we discussed the possibility of replacing the mitral valve using a mechanical prosthesis with the attendant need for long-term anticoagulation versus the alternative of replacing it using a bioprosthetic tissue valve with its potential for late structural valve deterioration and failure, depending upon the patient's longevity.  The patient specifically requests that if the mitral valve must be replaced that it be done using a bioprosthetic tissue valve.  The patient and her husband also understand the added complexity and risk associated with concomitant coronary artery bypass grafting. We will additionally plan to resect the endocardial mass noted in the left ventricular output tract if feasible at the time of surgery.   The patient and her husband both understand and accept all potential associated risks of surgery including but not limited to risk of death, stroke, myocardial infarction, congestive heart failure, respiratory failure, renal failure, bleeding requiring blood transfusion and/or reexploration, arrhythmia, heart block or bradycardia requiring permanent pacemaker, pneumonia, pleural effusion, wound infection, pulmonary embolus or other thromboembolic complication, chronic pain or other delayed complications.  They understand that because of her dance stage her postoperative convalescence may be somewhat slow and potentially require  temporary or long-term treatment in an inpatient rehabilitation facility or skilled nursing facility.  All questions have been answered.  We plan for surgery on Tuesday, October 8. The patient will begin amiodarone prophylactically tomorrow.    Salvatore Decent. Cornelius Moras, MD 10/04/2011 7:30 PM

## 2011-10-05 ENCOUNTER — Other Ambulatory Visit: Payer: Self-pay

## 2011-10-05 ENCOUNTER — Other Ambulatory Visit: Payer: Self-pay | Admitting: Thoracic Surgery (Cardiothoracic Vascular Surgery)

## 2011-10-05 ENCOUNTER — Encounter (HOSPITAL_COMMUNITY): Payer: Self-pay | Admitting: Pharmacy Technician

## 2011-10-05 DIAGNOSIS — I2510 Atherosclerotic heart disease of native coronary artery without angina pectoris: Secondary | ICD-10-CM

## 2011-10-05 DIAGNOSIS — I059 Rheumatic mitral valve disease, unspecified: Secondary | ICD-10-CM

## 2011-10-07 NOTE — Pre-Procedure Instructions (Signed)
20 Victoria Sexton  10/07/2011   Your procedure is scheduled on:  Tuesday, October 8th.  Report to [facility] at 5:30 AM.  Call this number if you have problems the morning of surgery: [phone]   Remember:   Do not eat food or drink any liquid:After Midnight.     Take these medicines the morning of surgery with A SIP OF WATER: Amiodarone (Pacerone), Amolodipine (Norvasc), Metoprolol (Lopressor).  Stop taking NSAIDs and herbal Medications.   Do not wear jewelry, make-up or nail polish.  Do not wear lotions, powders, or perfumes. You may wear deodorant.  Do not shave 48 hours prior to surgery. Men may shave face and neck.  Do not bring valuables to the hospital.  Contacts, dentures or bridgework may not be worn into surgery.  Leave suitcase in the car. After surgery it may be brought to your room.  For patients admitted to the hospital, checkout time is 11:00 AM the day of discharge.   Patients discharged the day of surgery will not be allowed to drive home.  Name and phone number of your driver: NA  Special Instructions: Shower using CHG 2 nights before surgery and the night before surgery.  If you shower the day of surgery use CHG.  Use special wash - you have one bottle of CHG for all showers.  You should use approximately 1/3 of the bottle for each shower.   Please read over the following fact sheets that you were given: Pain Booklet, Coughing and Deep Breathing, Blood Transfusion Information and Surgical Site Infection Prevention

## 2011-10-08 ENCOUNTER — Encounter (HOSPITAL_COMMUNITY)
Admission: RE | Admit: 2011-10-08 | Discharge: 2011-10-08 | Disposition: A | Discharge status: Home / Self Care | Payer: Medicare Other | Source: Ambulatory Visit | Attending: Thoracic Surgery (Cardiothoracic Vascular Surgery) | Admitting: Thoracic Surgery (Cardiothoracic Vascular Surgery)

## 2011-10-08 ENCOUNTER — Encounter (HOSPITAL_COMMUNITY): Payer: Self-pay

## 2011-10-08 ENCOUNTER — Encounter (HOSPITAL_COMMUNITY): Payer: Medicare Other

## 2011-10-08 ENCOUNTER — Ambulatory Visit (HOSPITAL_COMMUNITY)
Admission: RE | Admit: 2011-10-08 | Discharge: 2011-10-08 | Disposition: A | Discharge status: Home / Self Care | Payer: Medicare Other | Source: Ambulatory Visit | Attending: Thoracic Surgery (Cardiothoracic Vascular Surgery) | Admitting: Thoracic Surgery (Cardiothoracic Vascular Surgery)

## 2011-10-08 VITALS — BP 136/85 | HR 85 | Temp 97.7°F | Resp 20 | Ht 63.0 in | Wt 99.1 lb

## 2011-10-08 DIAGNOSIS — I2510 Atherosclerotic heart disease of native coronary artery without angina pectoris: Secondary | ICD-10-CM | POA: Insufficient documentation

## 2011-10-08 DIAGNOSIS — I10 Essential (primary) hypertension: Secondary | ICD-10-CM | POA: Diagnosis not present

## 2011-10-08 DIAGNOSIS — Z01810 Encounter for preprocedural cardiovascular examination: Secondary | ICD-10-CM | POA: Diagnosis not present

## 2011-10-08 DIAGNOSIS — I059 Rheumatic mitral valve disease, unspecified: Secondary | ICD-10-CM

## 2011-10-08 DIAGNOSIS — I6529 Occlusion and stenosis of unspecified carotid artery: Secondary | ICD-10-CM | POA: Diagnosis not present

## 2011-10-08 DIAGNOSIS — K449 Diaphragmatic hernia without obstruction or gangrene: Secondary | ICD-10-CM | POA: Diagnosis not present

## 2011-10-08 DIAGNOSIS — M8448XA Pathological fracture, other site, initial encounter for fracture: Secondary | ICD-10-CM | POA: Diagnosis not present

## 2011-10-08 HISTORY — DX: Angina pectoris, unspecified: I20.9

## 2011-10-08 HISTORY — DX: Shortness of breath: R06.02

## 2011-10-08 HISTORY — DX: Diaphragmatic hernia without obstruction or gangrene: K44.9

## 2011-10-08 HISTORY — DX: Cardiac murmur, unspecified: R01.1

## 2011-10-08 LAB — BLOOD GAS, ARTERIAL
Bicarbonate: 23.4 mEq/L (ref 20.0–24.0)
O2 Saturation: 96.4 %
Patient temperature: 98.6
TCO2: 24.6 mmol/L (ref 0–100)
pH, Arterial: 7.414 (ref 7.350–7.450)

## 2011-10-08 LAB — CBC
HCT: 32.2 % — ABNORMAL LOW (ref 36.0–46.0)
Hemoglobin: 9.7 g/dL — ABNORMAL LOW (ref 12.0–15.0)
MCH: 23.2 pg — ABNORMAL LOW (ref 26.0–34.0)
MCHC: 30.1 g/dL (ref 30.0–36.0)
MCV: 76.8 fL — ABNORMAL LOW (ref 78.0–100.0)
RDW: 17.4 % — ABNORMAL HIGH (ref 11.5–15.5)

## 2011-10-08 LAB — PROTIME-INR
INR: 1.06 (ref 0.00–1.49)
Prothrombin Time: 13.7 seconds (ref 11.6–15.2)

## 2011-10-08 LAB — HEMOGLOBIN A1C
Hgb A1c MFr Bld: 5.9 % — ABNORMAL HIGH (ref <5.7)
Mean Plasma Glucose: 123 mg/dL — ABNORMAL HIGH (ref <117)

## 2011-10-08 LAB — URINALYSIS, ROUTINE W REFLEX MICROSCOPIC
Leukocytes, UA: NEGATIVE
Nitrite: NEGATIVE
Protein, ur: NEGATIVE mg/dL
Urobilinogen, UA: 0.2 mg/dL (ref 0.0–1.0)

## 2011-10-08 LAB — COMPREHENSIVE METABOLIC PANEL
Albumin: 3.8 g/dL (ref 3.5–5.2)
Alkaline Phosphatase: 72 U/L (ref 39–117)
BUN: 6 mg/dL (ref 6–23)
Calcium: 9.9 mg/dL (ref 8.4–10.5)
Creatinine, Ser: 0.53 mg/dL (ref 0.50–1.10)
GFR calc Af Amer: >90 mL/min (ref >90)
Glucose, Bld: 101 mg/dL — ABNORMAL HIGH (ref 70–99)
Total Protein: 6.8 g/dL (ref 6.0–8.3)

## 2011-10-08 LAB — SURGICAL PCR SCREEN: MRSA, PCR: NEGATIVE

## 2011-10-08 NOTE — Progress Notes (Addendum)
Pt is not taking Amiodarone, states she did not start taking\it because she was afraid of the side effects and that she spoke with JoLean at Dr Barry Dienes office and was told that she does not have to take it.  I had it listed as a medication to take the day of surgery.  Pts husband had me call Jolean and make sure patient does not need to take it . JoLean said to me that Dr Cornelius Moras said that patient does not need to take it.

## 2011-10-08 NOTE — Progress Notes (Signed)
VASCULAR LAB PRELIMINARY  PRELIMINARY  PRELIMINARY  PRELIMINARY  Pre-op Cardiac Surgery  Carotid Findings:  Right:  40-59% ICA stenosis.  Left:  No evidence of significant ICA stenosis.  Bilateral:  Vertebral artery flow is antegrade.    Upper Extremity Right Left  Brachial Pressures 123 T 127 T  Radial Waveforms T T  Ulnar Waveforms T T  Palmar Arch (Allen's Test) * **   Findings:  *Right:  Doppler waveforms decrease >50% with ulnar and remain normal with radial compressions.                     **Left:  Doppler waveforms remain normal with ulnar and radial compressions.      Lower  Extremity Right Left  Dorsalis Pedis    Anterior Tibial T T  Posterior Tibial    Ankle/Brachial Indices      Findings:  Palpable pedal pulses x 4.     Ciel Chervenak, 10/08/2011, 12:43 PM

## 2011-10-08 NOTE — Progress Notes (Signed)
Dr Jean Rosenthal notified of Hgb of 9.7 and that last one was 10.2 on 9/24.  Dr Jean Rosenthal said they would take care of it on day of surgery.

## 2011-10-11 MED ORDER — DOPAMINE-DEXTROSE 3.2-5 MG/ML-% IV SOLN
2.0000 ug/kg/min | INTRAVENOUS | Status: DC
  Filled 2011-10-11: qty 250

## 2011-10-11 MED ORDER — SODIUM CHLORIDE 0.9 % IV SOLN
INTRAVENOUS | Status: AC
  Administered 2011-10-12: 1.8 [IU]/h via INTRAVENOUS
  Administered 2011-10-12: 2 [IU]/h via INTRAVENOUS
  Filled 2011-10-11: qty 1

## 2011-10-11 MED ORDER — SODIUM CHLORIDE 0.9 % IV SOLN
INTRAVENOUS | Status: AC
  Administered 2011-10-12: 70 mL/h via INTRAVENOUS
  Filled 2011-10-11: qty 40

## 2011-10-11 MED ORDER — PLASMA-LYTE 148 IV SOLN
INTRAVENOUS | Status: AC
  Administered 2011-10-12: 09:00:00
  Filled 2011-10-11: qty 2.5

## 2011-10-11 MED ORDER — DEXMEDETOMIDINE HCL IN NACL 400 MCG/100ML IV SOLN
0.1000 ug/kg/h | INTRAVENOUS | Status: AC
  Administered 2011-10-12: 0.2 ug/kg/h via INTRAVENOUS
  Filled 2011-10-11: qty 100

## 2011-10-11 MED ORDER — METOPROLOL TARTRATE 12.5 MG HALF TABLET: 12.5000 mg | ORAL_TABLET | Freq: Once | ORAL | Status: DC

## 2011-10-11 MED ORDER — MOXIFLOXACIN HCL IN NACL 400 MG/250ML IV SOLN
400.0000 mg | INTRAVENOUS | Status: AC
Start: 2011-10-12 — End: 2011-10-12
  Administered 2011-10-12: 400 mg via INTRAVENOUS
  Filled 2011-10-11: qty 250

## 2011-10-11 MED ORDER — CHLORHEXIDINE GLUCONATE 4 % EX LIQD: 30.0000 mL | CUTANEOUS | Status: DC

## 2011-10-11 MED ORDER — MAGNESIUM SULFATE 50 % IJ SOLN
40.0000 meq | INTRAMUSCULAR | Status: DC
  Filled 2011-10-11: qty 10

## 2011-10-11 MED ORDER — EPINEPHRINE HCL 1 MG/ML IJ SOLN
0.5000 ug/min | INTRAMUSCULAR | Status: DC
  Filled 2011-10-11: qty 4

## 2011-10-11 MED ORDER — POTASSIUM CHLORIDE 2 MEQ/ML IV SOLN
80.0000 meq | INTRAVENOUS | Status: DC
  Filled 2011-10-11: qty 40

## 2011-10-11 MED ORDER — NITROGLYCERIN IN D5W 200-5 MCG/ML-% IV SOLN
2.0000 ug/min | INTRAVENOUS | Status: AC
  Administered 2011-10-12: 5 ug/min via INTRAVENOUS
  Filled 2011-10-11: qty 250

## 2011-10-11 MED ORDER — PHENYLEPHRINE HCL 10 MG/ML IJ SOLN
30.0000 ug/min | INTRAVENOUS | Status: AC
  Administered 2011-10-12: 40 ug/min via INTRAVENOUS
  Filled 2011-10-11: qty 2

## 2011-10-11 MED ORDER — VANCOMYCIN HCL 1000 MG IV SOLR
1000.0000 mg | INTRAVENOUS | Status: AC
  Administered 2011-10-12: 1000 mg via INTRAVENOUS
  Filled 2011-10-11 (×2): qty 1000

## 2011-10-11 NOTE — H&P (Signed)
CARDIOTHORACIC SURGERY HISTORY AND PHYSICAL EXAM  PCP is Cala Bradford, MD Referring Provider is Donato Schultz, MD   Chief Complaint:  Shortness of Breath  HPI:  Patient is an 78 year old female with mitral valve prolapse and severe mitral regurgitation who is well known to me.  I originally saw her in consultation on 11/23/2010 and have followed her since then several times in the office, most recently on 05/10/2011.  The patient has mitral valve prolapse with a large flail segment of the posterior leaflet and severe mitral regurgitation.  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 Up until now the patient has remained asymptomatic, and because of this she has previously declined my offer to proceed with elective mitral valve repair and resection of the small mass in the left ventricular outflow tract.  The patient reports that recently she has been feeling more tired than usual.  She has been less active physically but otherwise reports no specific problems until 2 weeks ago when she developed a somewhat acute onset of resting shortness of breath associated with PND and orthopnea. She states that she also had some tightness across her chest.  She was brought to the emergency department where she was found to be in acute congestive heart failure with elevated pro BNP level and pulmonary edema on baseline CXR.  Electrocardiogram was notable for sinus rhythm without ischemic changes and troponin was negative.  She was hospitalized for congestive heart failure and I saw her in consultation on 09/28/2011.  Symptoms improved rapidly with diuretic therapy.  She underwent left and right heart catheterization the following day by Dr. Anne Fu and was found to have severe three-vessel coronary artery disease. The patient was discharged from the hospital in returns to the office to discuss the results of her  cardiac catheterization and possibly make final plans for elective surgical intervention. She reports that her reading has remained stable since hospital discharge. She has not had any chest pain nor any recurrence of resting shortness of breath.   Past Medical History  Diagnosis Date  . Hypertension   . MVP (mitral valve prolapse)   . Osteoporosis   . Inguinal hernia right  . Mitral regurgitation due to cusp prolapse   . Cardiac tumor, ventricular     LVOT  . PVC (premature ventricular contraction)   . Hiatal hernia   . Coronary artery disease 10/04/2011  . Congestive heart failure 10/04/2011  . Heart murmur   . Shortness of breath   . Anginal pain   . Hernia, hiatal     Left wears a hernia belt  . Skin cancer 2013    Face.    Past Surgical History  Procedure Date  . Abdominal hysterectomy   . Leg fx repair     Right  . Hemorroidectomy   . Tee without cardioversion 11/13/2010    Procedure: TRANSESOPHAGEAL ECHOCARDIOGRAM (TEE);  Surgeon: Donato Schultz, MD;  Location: [facility] ENDOSCOPY;  Service: Cardiovascular;  Laterality: N/A;  . Skin cancer excision 2013    not melanoma  . Hemorrhoid surgery     No family history on file.  Social History History  Substance Use Topics  . Smoking status: Former Smoker -- 0.5 packs/day for 30 years    Types: Cigarettes    Quit date: 11/13/2010  . Smokeless tobacco: Not on file  . Alcohol Use: No  Prior to Admission medications   Medication Sig Start Date End Date Taking? Authorizing Provider  amLODipine (NORVASC) 5 MG tablet Take 5 mg by mouth daily.    Yes Historical Provider, MD  aspirin 81 MG tablet Take 81 mg by mouth daily.    Yes Historical Provider, MD  co-enzyme Q-10 30 MG capsule Take 30 mg by mouth daily.    Yes Historical Provider, MD  metoprolol tartrate (LOPRESSOR) 25 MG tablet Take 12.5 mg by mouth 2 (two) times daily. 09/30/11  Yes Donato Schultz, MD  multivitamin-lutein [facility]) CAPS Take 1 capsule by mouth daily.     Yes Historical Provider, MD  vitamin E 400 UNIT capsule Take 400 Units by mouth daily.   Yes Historical Provider, MD  amiodarone (PACERONE) 200 MG tablet Take 200 mg by mouth 2 (two) times daily. 10/04/11   Purcell Nails, MD  KRILL OIL PO Take 300 mg by mouth.    Historical Provider, MD    Allergies  Allergen Reactions  . Penicillins Palpitations  . Sulfa Antibiotics Rash    Review of Systems:             General:                      normal appetite, decreased energy               Respiratory:                no cough, no wheezing, no hemoptysis, no pain with inspiration or cough, + shortness of breath               Cardiac:                      + chest tightness at the time of recent hospitalization but none since, + exertional SOB, + resting SOB, + PND, + orthopnea, no LE edema, + palpitations, no syncope             GI:                                no difficulty swallowing, no hematochezia, no hematemesis, no melena, no constipation, no diarrhea               GU:                              no dysuria, no urgency, no frequency               Musculoskeletal:         no arthritis, no arthralgia               Vascular:                     no pain suggestive of claudication               Neuro:                         no symptoms suggestive of TIA's, no seizures, no headaches, no peripheral neuropathy               Endocrine:                   Negative  HEENT:                       no loose teeth or painful teeth,  no recent vision changes             Psych:                         no anxiety, no depression                Physical Exam:              BP 102/44  Pulse 79  Temp 98.1 F (36.7 C) (Oral)  Resp 25  Ht 5' (1.524 m)  Wt 45.2 kg (99 lb 10.4 oz)  BMI 19.46 kg/m2  SpO2 93%             General:                      Thin and frail-appearing             HEENT:                       Unremarkable               Neck:                           no JVD, no bruits,  no adenopathy               Chest:                         clear to auscultation, symmetrical breath sounds, no wheezes, no rhonchi               CV:                              RRR, loud grade IV/VI holosystolic murmur               Abdomen:                    soft, non-tender, no masses               Extremities:                 warm, well-perfused, pulses diminished, no LE edema             Rectal/GU                   Deferred             Neuro:                         Grossly non-focal and symmetrical throughout             Skin:                            Clean and dry, no rashes, no breakdown   Diagnostic Tests:  Transthoracic Echocardiography  (Report amended )  Patient: Jaylenn, Naki MR #: [id number] Study Date: 09/29/2011 Gender: F Age: 69 Height: 152.4cm Weight: 42.4kg BSA: 1.56m^2 Pt. Status: Room: 2908  [facility], Ec Nila Nephew,  Marilu Favre MD SONOGRAPHER Cathie Beams ATTENDING Donato Schultz cc:  ------------------------------------------------------------ LV EF: 55% - 60%  ------------------------------------------------------------ Indications: Previous study (TEE) 11/13/2010. Mitral regurgitation 424.0.  ------------------------------------------------------------ History: PMH: Cardiac tumor (ventricular). Right inguinal hernia. Dyspnea. Risk factors: Hypertension.  ------------------------------------------------------------ Study Conclusions  - Left ventricle: The cavity size was normal. There was moderate concentric hypertrophy. Systolic function was normal. The estimated ejection fraction was in the range of 55% to 60%. Wall motion was asynchronous consistent with bundle branch block; there were no regional wall motion abnormalities. 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 No change. - Mitral valve: Calcified annulus. Moderately thickened leaflets with anterior prolapse. Moderate to  severe regurgitation directed eccentrically wrapping around dilated left atrium - Left atrium: The atrium was moderately dilated. Transthoracic echocardiography. M-mode, complete 2D, spectral Doppler, and color Doppler. Height: Height: 152.4cm. Height: 60in. Weight: Weight: 42.4kg. Weight: 93.3lb. Body mass index: BMI: 18.3kg/m^2. Body surface area: BSA: 1.70m^2. Blood pressure: 123/50. Patient status: Inpatient. Location: ICU/CCU  ------------------------------------------------------------  ------------------------------------------------------------ Left ventricle: The cavity size was normal. There was moderate concentric hypertrophy. Systolic function was normal. The estimated ejection fraction was in the range of 55% to 60%. Wall motion was asynchronous consistent with bundle branch block; there were no regional wall motion abnormalities. There was no evidence of elevated ventricular filling pressure by Doppler parameters. 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 No change.  ------------------------------------------------------------ Aortic valve: Trileaflet; mildly thickened, mildly calcified leaflets. Cusp separation was normal. Doppler: Transvalvular velocity was minimally increased. There was no significant stenosis. No regurgitation. VTI ratio of LVOT to aortic valve: 0.51. Valve area: 1.02cm^2(VTI). Indexed valve area: 0.76cm^2/m^2 (VTI). Peak velocity ratio of LVOT to aortic valve: 0.52. Valve area: 1.05cm^2 (Vmax). Indexed valve area: 0.79cm^2/m^2 (Vmax). Mean velocity ratio of LVOT to aortic valve: 0.51. Valve area: 1.02cm^2 (Vmean). Indexed valve area: 0.76cm^2/m^2 (Vmean). Mean gradient: 8mm Hg (S). Peak gradient: 16mm Hg (S).  ------------------------------------------------------------ Aorta: The aorta was not dilated, mildly calcified,  and non-diseased.  ------------------------------------------------------------ Mitral valve: Calcified annulus. Moderately thickened leaflets with anterior prolapse. Doppler: Moderate to severe regurgitation directed eccentrically wrapping around dilated left atrium Peak gradient: 4mm Hg (D).  ------------------------------------------------------------ Left atrium: The atrium was moderately dilated.  ------------------------------------------------------------ Right ventricle: The cavity size was normal. Wall thickness was normal. Systolic function was normal.  ------------------------------------------------------------ Pulmonic valve: Structurally normal valve. Cusp separation was normal. Doppler: Transvalvular velocity was within the normal range. No regurgitation.  ------------------------------------------------------------ Tricuspid valve: Structurally normal valve. Leaflet separation was normal. Doppler: No regurgitation.  ------------------------------------------------------------ Pulmonary artery: The main pulmonary artery was normal-sized.  ------------------------------------------------------------ Right atrium: The atrium was normal in size.  ------------------------------------------------------------ Pericardium: The pericardium was normal in appearance. There was no pericardial effusion.  ------------------------------------------------------------ Systemic veins: Inferior vena cava: The vessel was normal in size; the respirophasic diameter changes were in the normal range (= 50%); findings are consistent with normal central venous pressure.  ------------------------------------------------------------ Post procedure conclusions Ascending Aorta:  - The aorta was not dilated, mildly calcified, and non-diseased.  ------------------------------------------------------------  2D measurements Normal Doppler measurements Normal Left ventricle  LVOT LVID ED, 31.5 mm 43-52 Peak vel, 104 cm/s ------ chord, S PLAX Mean vel, 67.5 cm/s ------ LVID ES, 24.5 mm 23-38 S chord, VTI, S 19.2 cm ------ PLAX Aortic valve FS, chord, 22 % >29 Peak vel, 200 cm/s ------ PLAX S LVPW, ED 12.5 mm ------ Mean vel, 133 cm/s ------ IVS/LVPW 1.16 <1.3 S ratio, ED VTI, S 37.9 cm ------  Ventricular septum Mean 8 mm Hg ------ IVS, ED 14.5 mm ------ gradient, LVOT S Diam, S 16 mm ------ Peak 16 mm Hg ------ Area 2.01 cm^2 ------ gradient, Aorta S Root diam, 27 mm ------ VTI ratio 0.51 ------ ED LVOT/AV Left atrium Area, VTI 1.02 cm^2 ------ AP dim 48 mm ------ Area index 0.76 cm^2/m ------ AP dim 3.6 cm/m^2 <2.2 (VTI) ^2 index Peak vel 0.52 ------ ratio, LVOT/AV Area, Vmax 1.05 cm^2 ------ Area index 0.79 cm^2/m ------ (Vmax) ^2 Vmean 0.51 ------ ratio LVOT/AV Area, 1.02 cm^2 ------ Vmean Area index 0.76 cm^2/m ------ (Vmean) ^2 Mitral valve Peak E vel 104 cm/s ------ Peak A vel 119 cm/s ------ Decelerati 201 ms 150-23 on time 0 Peak 4 mm Hg ------ gradient, D Peak E/A 0.9 ------ ratio Regurg 38.5 cm/s ------ alias vel, PISA Regurg 6 mm ------ radius, PISA Max regurg 594 cm/s ------ vel Regurg VTI 169 cm ------ ERO, PISA 0.15 cm^2 ------ Regurg 25 ml ------ vol, PISA Right ventricle Sa vel, 10.5 cm/s ------ lat ann, tiss DP  ------------------------------------------------------------ Mikle Bosworth, Mark 2013-09-25T14:41:25.750    CARDIAC CATHETERIZATION   PROCEDURE: Right and left heart catheterization with selective coronary angiography, left ventriculogram, cardiac outputs, selective oxygen saturations. Ascending aortogram, thoracic aortogram, abdominal aortogram   INDICATIONS: 78 year old with severe mitral regurgitation, recent acute heart failure exacerbation here for preoperative cardiac catheterization/anticipation of mitral valve repair.   The risks, benefits, and details of the procedure were  explained to the patient, including stroke, heart attack, death, renal impairment, arterial damage, bleeding. The patient verbalized understanding and wanted to proceed. Informed written consent was obtained.   PROCEDURE TECHNIQUE: After Xylocaine anesthesia and visualization of the femoral head via fluoroscopy, a 34F sheath was placed in the left femoral artery using the modified Seldinger technique. A 7 French sheath was inserted into the left femoral vein using the modified Seldinger technique. Left coronary angiography was done using a Judkins L4 catheter. Right coronary angiography was done using a Judkins R4 catheter. Multiple views with hand injection of Omnipaque were obtained. 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 Right heart catheterization was performed with a balloon tipped Swan-Ganz catheter traversing the right-sided heart structures to the wedge position. Oxygen saturation was drawn in the pulmonary artery as well as femoral artery. Hemodynamics were obtained. Cardiac outputs were obtained utilizing the Fick and thermodilution methods. Following the procedure, sheaths were removed and patient was hemodynamically stable. Ascending aortogram was performed/thoracic/descending aortogram utilizing the pigtail catheter each utilizing 20 mL of contrast.   CONTRAST: Total of 110 ml.   FLOUROSCOPY TIME: 6.8 minutes.   COMPLICATIONS: None.   HEMODYNAMICS:   Right atrium (RA): 2/0 mmHg   Right ventricle (RV): 18/0/2 mmHg   Pulmonary artery (PA): 21/6/12 mmHg   Pulmonary capillary wedge pressure (PCWP): 5/2/3 mmHg   Cardiac output: Fick 4.04 liters/min   Cardiac index: Fick 2.99   PA saturation: 64 %   FA saturation: 96 %   Aortic pressure: 105/46/69 mmHg   LV pressure: not done.   ANGIOGRAPHIC DATA:   Left main: Distal heavily calcified, 30-40% narrowing.   Left anterior descending (LAD): Severe ostial stenosis, heavily calcified, 90%.  Severely calcified arterial tree, significant LAD disease at the bifurcation of large first diagonal branch. Large mid diagonal branch with significant stenosis proximally of up to 80%. Once again heavily diseased artery.   Circumflex artery (CIRC): Severely calcified vessel, proximal lengthy stenosis of 70-80%. 90% stenosis  at the bifurcation of first obtuse marginal branch. There significant collateral vessels traversing from the circumflex distribution to supply the entire right coronary artery up to the ostium.   Right coronary artery (RCA): Occluded ostially. The vessel however has excellent left to right collaterals filling the entire vessel. The right coronary artery is heavily calcified and there appears to be significant stenosis in the mid section of up to 90%.   Aortogram: Ascending aortogram demonstrates aortic arch calcification but no significant aneurysmal segments. The thoracic aorta is tortuous and mildly calcified. The descending aorta demonstrates a fusiform abdominal aortic aneurysm, infrarenal, quite small in caliber. There is moderate disease throughout the iliac vessels bilaterally. There appears to be significant tubular stenosis in the common iliac vessel on the right of 80%. Mild bilateral renal artery disease.   IMPRESSIONS:   1. Severe multivessel coronary artery disease, proximal LAD, proximal circumflex, ostial right coronary artery with left to right collaterals in severely diseased/calcified vessels.    2. 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  3. Normal right-sided heart pressures.  4. Normal cardiac output. 5. Moderately calcified aortic arch, mildly calcified descending thoracic aorta.   6. Infrarenal abdominal aortic aneurysm, relatively small in size.   7. Severe peripheral vascular disease with 80-90% common iliac stenosis on the right.   8. Mild bilateral renal artery  disease.      Noninvasive Vascular Lab  Preoperative Vascular Evaluation  Patient: Radiah, Lubinski MR #: [id number] Study Date: 09/28/2011 Gender: F Age: 59 Height: Weight: BSA: Pt. Status: Room: 2908  Rutherford Limerick MD REFERRING Tressie Stalker MD ADMITTING Donato Schultz ATTENDING Donato Schultz SONOGRAPHER Farrel Demark, RVT, RDMS Reports also to:  ------------------------------------------------------------ History and indications:  Indications  424.0 Mitral valve diseasea. V72.81 Screening Pre-operative. History  Pre-op evaluation  ------------------------------------------------------------ Study information:  Study status: Routine. Procedure: A vascular evaluation was performed. Image quality was adequate. Preoperative vascular evaluation for heart surgery. Carotid duplex exam per standard extablished protocols, limited upper extremity arterial evaluation and ABIs as indicated. Location: Bedside. Patient status: Inpatient.  Arterial flow:  +-------------------+--------+-------+--------------------+ Location V sys V ed Comment  +-------------------+--------+-------+--------------------+ Right CCA - 73cm/s 17cm/s Mild intimal  proximal   thickening  +-------------------+--------+-------+--------------------+ Right CCA - distal 80cm/s 17cm/s Mild intimal     thickening  +-------------------+--------+-------+--------------------+ Right ECA 112cm/s 24cm/s Mild heterogenous     plaque  +-------------------+--------+-------+--------------------+ Right ICA - -195cm/s-30cm/sModerate calcific  proximal   plaque with acoustic    shadowing  +-------------------+--------+-------+--------------------+ Right ICA - distal -59cm/s -13cm/s-------------------- +-------------------+--------+-------+--------------------+ Right vertebral 38cm/s 9cm/s  -------------------- +-------------------+--------+-------+--------------------+ Left CCA - proximal116cm/s 16cm/s Mild intimal     thickening  +-------------------+--------+-------+--------------------+ Left CCA - distal -101cm/s-19cm/sMild intimal     thickening  +-------------------+--------+-------+--------------------+ Left ECA -212cm/s-23cm/sMild irregular     heterogenous plaque  +-------------------+--------+-------+--------------------+ Left ICA - proximal-154cm/s-22cm/sModerate calcific     plaque with acoustic    shadowing  +-------------------+--------+-------+--------------------+ Left ICA - distal -52cm/s -6cm/s -------------------- +-------------------+--------+-------+--------------------+ Left vertebral 85cm/s 8cm/s -------------------- +-------------------+--------+-------+--------------------+  ------------------------------------------------------------ Summary:  - Bilaterally 40-59% internal carotid artery stenosis. Antegrade vertebral flow. - Right ICA / CCA ratio= 2.44. Left ICA / CCA ratio= 1.52. Prepared and Electronically Authenticated by  Durene Cal 2013-09-26T15:56:[id number]   Impression:  The patient has long-standing mitral valve prolapse with likely flail segment of the posterior leaflet of the mitral valve and severe mitral regurgitation. Left ventricular function is only mildly reduced with ejection fraction estimated 55%.  The patient  also has severe three-vessel coronary artery disease with high-grade stenosis of the left anterior descending coronary artery and the left circumflex coronary artery as well as chronic occlusion of the right coronary artery. Pulmonary artery pressures were not elevated at the time of catheterization.  The patient describes a long-standing gradual progression of symptoms of fatigue followed by acute exacerbation of resting shortness of breath and chest tightness  consistent with class IV congestive heart failure and unstable angina pectoris for which she was hospitalized last week. She is now stable on medical therapy. 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  Risks associated with elective mitral valve repair and coronary artery bypass grafting will be elevated because of the patient's advanced age and frail underlying physical condition.  Plan:  The rationale for elective mitral valve repair and coronary artery bypass surgery has been explained, including a comparison between surgery and continued medical therapy with close follow-up.  The likelihood of successful and durable valve repair has been discussed with particular reference to the findings of their recent echocardiogram.  Based upon these findings and previous experience, I have quoted them a greater than 90 percent likelihood of successful valve repair.  In the unlikely event that their valve cannot be successfully repaired, we discussed the possibility of replacing the mitral valve using a mechanical prosthesis with the attendant need for long-term anticoagulation versus the alternative of replacing it using a bioprosthetic tissue valve with its potential for late structural valve deterioration and failure, depending upon the patient's longevity.  The patient specifically requests that if the mitral valve must be replaced that it be done using a bioprosthetic tissue valve.  The patient and her husband also understand the added complexity and risk associated with concomitant coronary artery bypass grafting. We will additionally plan to resect the endocardial mass noted in the left ventricular output tract if feasible at the time of surgery.   The patient and her husband both understand and accept all potential associated risks of surgery including but not limited to risk of death, stroke, myocardial  infarction, congestive heart failure, respiratory failure, renal failure, bleeding requiring blood transfusion and/or reexploration, arrhythmia, heart block or bradycardia requiring permanent pacemaker, pneumonia, pleural effusion, wound infection, pulmonary embolus or other thromboembolic complication, chronic pain or other delayed complications.  They understand that because of her dance stage her postoperative convalescence may be somewhat slow and potentially require temporary or long-term treatment in an inpatient rehabilitation facility or skilled nursing facility.  All questions have been answered.  We plan for surgery on Tuesday, October 8.    Salvatore Decent. Cornelius Moras, MD 10/04/2011 7:30 PM

## 2011-10-12 ENCOUNTER — Inpatient Hospital Stay (HOSPITAL_COMMUNITY): Payer: Medicare Other

## 2011-10-12 ENCOUNTER — Encounter (HOSPITAL_COMMUNITY)
Admission: RE | Disposition: A | Discharge status: Home Health | Payer: Self-pay | Source: Ambulatory Visit | Attending: Thoracic Surgery (Cardiothoracic Vascular Surgery)

## 2011-10-12 ENCOUNTER — Ambulatory Visit (HOSPITAL_COMMUNITY): Payer: Medicare Other | Admitting: Vascular Surgery

## 2011-10-12 ENCOUNTER — Inpatient Hospital Stay (HOSPITAL_COMMUNITY)
Admission: RE | Admit: 2011-10-12 | Discharge: 2011-10-19 | DRG: 220 | Disposition: A | Discharge status: Home Health | Payer: Medicare Other | Source: Ambulatory Visit | Attending: Thoracic Surgery (Cardiothoracic Vascular Surgery) | Admitting: Thoracic Surgery (Cardiothoracic Vascular Surgery)

## 2011-10-12 ENCOUNTER — Encounter (HOSPITAL_COMMUNITY): Payer: Self-pay | Admitting: Vascular Surgery

## 2011-10-12 ENCOUNTER — Encounter (HOSPITAL_COMMUNITY): Payer: Self-pay | Admitting: *Deleted

## 2011-10-12 DIAGNOSIS — I059 Rheumatic mitral valve disease, unspecified: Secondary | ICD-10-CM | POA: Diagnosis not present

## 2011-10-12 DIAGNOSIS — Z87891 Personal history of nicotine dependence: Secondary | ICD-10-CM

## 2011-10-12 DIAGNOSIS — D696 Thrombocytopenia, unspecified: Secondary | ICD-10-CM | POA: Diagnosis not present

## 2011-10-12 DIAGNOSIS — I2510 Atherosclerotic heart disease of native coronary artery without angina pectoris: Secondary | ICD-10-CM | POA: Diagnosis not present

## 2011-10-12 DIAGNOSIS — M810 Age-related osteoporosis without current pathological fracture: Secondary | ICD-10-CM | POA: Diagnosis present

## 2011-10-12 DIAGNOSIS — I10 Essential (primary) hypertension: Secondary | ICD-10-CM | POA: Diagnosis present

## 2011-10-12 DIAGNOSIS — J90 Pleural effusion, not elsewhere classified: Secondary | ICD-10-CM | POA: Diagnosis not present

## 2011-10-12 DIAGNOSIS — D62 Acute posthemorrhagic anemia: Secondary | ICD-10-CM | POA: Diagnosis not present

## 2011-10-12 DIAGNOSIS — R0602 Shortness of breath: Secondary | ICD-10-CM | POA: Diagnosis not present

## 2011-10-12 DIAGNOSIS — Z951 Presence of aortocoronary bypass graft: Secondary | ICD-10-CM

## 2011-10-12 DIAGNOSIS — Q211 Atrial septal defect: Secondary | ICD-10-CM | POA: Diagnosis not present

## 2011-10-12 DIAGNOSIS — Q2111 Secundum atrial septal defect: Secondary | ICD-10-CM

## 2011-10-12 DIAGNOSIS — K449 Diaphragmatic hernia without obstruction or gangrene: Secondary | ICD-10-CM | POA: Diagnosis not present

## 2011-10-12 DIAGNOSIS — Z09 Encounter for follow-up examination after completed treatment for conditions other than malignant neoplasm: Secondary | ICD-10-CM | POA: Diagnosis not present

## 2011-10-12 DIAGNOSIS — I509 Heart failure, unspecified: Secondary | ICD-10-CM | POA: Diagnosis present

## 2011-10-12 DIAGNOSIS — D151 Benign neoplasm of heart: Secondary | ICD-10-CM | POA: Diagnosis not present

## 2011-10-12 DIAGNOSIS — J9819 Other pulmonary collapse: Secondary | ICD-10-CM | POA: Diagnosis not present

## 2011-10-12 DIAGNOSIS — Z9889 Other specified postprocedural states: Secondary | ICD-10-CM

## 2011-10-12 DIAGNOSIS — I517 Cardiomegaly: Secondary | ICD-10-CM | POA: Diagnosis not present

## 2011-10-12 DIAGNOSIS — R918 Other nonspecific abnormal finding of lung field: Secondary | ICD-10-CM | POA: Diagnosis not present

## 2011-10-12 DIAGNOSIS — D4989 Neoplasm of unspecified behavior of other specified sites: Secondary | ICD-10-CM

## 2011-10-12 DIAGNOSIS — Q200 Common arterial trunk: Secondary | ICD-10-CM | POA: Diagnosis not present

## 2011-10-12 DIAGNOSIS — K5900 Constipation, unspecified: Secondary | ICD-10-CM | POA: Diagnosis present

## 2011-10-12 HISTORY — PX: PATENT FORAMEN OVALE CLOSURE: SHX5483

## 2011-10-12 HISTORY — PX: MITRAL VALVE REPAIR: SHX2039

## 2011-10-12 HISTORY — DX: Other specified postprocedural states: Z98.890

## 2011-10-12 HISTORY — PX: CORONARY ARTERY BYPASS GRAFT: SHX141

## 2011-10-12 HISTORY — DX: Presence of aortocoronary bypass graft: Z95.1

## 2011-10-12 LAB — CBC
HCT: 30.3 % — ABNORMAL LOW (ref 36.0–46.0)
MCV: 76.6 fL — ABNORMAL LOW (ref 78.0–100.0)
MCV: 77.7 fL — ABNORMAL LOW (ref 78.0–100.0)
Platelets: 124 10*3/uL — ABNORMAL LOW (ref 150–400)
Platelets: 131 10*3/uL — ABNORMAL LOW (ref 150–400)
RBC: 3.29 MIL/uL — ABNORMAL LOW (ref 3.87–5.11)
RBC: 3.9 MIL/uL (ref 3.87–5.11)
WBC: 8.7 10*3/uL (ref 4.0–10.5)
WBC: 9.5 10*3/uL (ref 4.0–10.5)

## 2011-10-12 LAB — POCT I-STAT 3, ART BLOOD GAS (G3+)
Acid-base deficit: 2 mmol/L (ref 0.0–2.0)
Bicarbonate: 24.9 mEq/L — ABNORMAL HIGH (ref 20.0–24.0)
Bicarbonate: 19.5 mEq/L — ABNORMAL LOW (ref 20.0–24.0)
Bicarbonate: 24.1 mEq/L — ABNORMAL HIGH (ref 20.0–24.0)
Bicarbonate: 23.2 mEq/L (ref 20.0–24.0)
O2 Saturation: 100 %
O2 Saturation: 93 %
Patient temperature: 36.5
TCO2: 20 mmol/L (ref 0–100)
TCO2: 25 mmol/L (ref 0–100)
pCO2 arterial: 31.1 mmHg — ABNORMAL LOW (ref 35.0–45.0)
pCO2 arterial: 47.6 mmHg — ABNORMAL HIGH (ref 35.0–45.0)
pH, Arterial: 7.348 — ABNORMAL LOW (ref 7.350–7.450)
pH, Arterial: 7.476 — ABNORMAL HIGH (ref 7.350–7.450)
pO2, Arterial: 66 mmHg — ABNORMAL LOW (ref 80.0–100.0)
pO2, Arterial: 128 mmHg — ABNORMAL HIGH (ref 80.0–100.0)
pO2, Arterial: 133 mmHg — ABNORMAL HIGH (ref 80.0–100.0)

## 2011-10-12 LAB — POCT I-STAT 4, (NA,K, GLUC, HGB,HCT)
Glucose, Bld: 120 mg/dL — ABNORMAL HIGH (ref 70–99)
Glucose, Bld: 127 mg/dL — ABNORMAL HIGH (ref 70–99)
Glucose, Bld: 119 mg/dL — ABNORMAL HIGH (ref 70–99)
HCT: 28 % — ABNORMAL LOW (ref 36.0–46.0)
HCT: 22 % — ABNORMAL LOW (ref 36.0–46.0)
HCT: 18 % — ABNORMAL LOW (ref 36.0–46.0)
HCT: 24 % — ABNORMAL LOW (ref 36.0–46.0)
Hemoglobin: 9.5 g/dL — ABNORMAL LOW (ref 12.0–15.0)
Hemoglobin: 7.5 g/dL — ABNORMAL LOW (ref 12.0–15.0)
Hemoglobin: 6.1 g/dL — CL (ref 12.0–15.0)
Potassium: 5.8 mEq/L — ABNORMAL HIGH (ref 3.5–5.1)
Potassium: 4.8 mEq/L (ref 3.5–5.1)
Potassium: 4.1 mEq/L (ref 3.5–5.1)
Sodium: 141 mEq/L (ref 135–145)
Sodium: 141 mEq/L (ref 135–145)
Sodium: 144 mEq/L (ref 135–145)
Sodium: 147 mEq/L — ABNORMAL HIGH (ref 135–145)

## 2011-10-12 LAB — GLUCOSE, CAPILLARY
Glucose-Capillary: 133 mg/dL — ABNORMAL HIGH (ref 70–99)
Glucose-Capillary: 130 mg/dL — ABNORMAL HIGH (ref 70–99)
Glucose-Capillary: 105 mg/dL — ABNORMAL HIGH (ref 70–99)

## 2011-10-12 LAB — PLATELET COUNT: Platelets: 83 10*3/uL — ABNORMAL LOW (ref 150–400)

## 2011-10-12 LAB — PROTIME-INR: INR: 1.38 (ref 0.00–1.49)

## 2011-10-12 LAB — CREATININE, SERUM
Creatinine, Ser: 0.4 mg/dL — ABNORMAL LOW (ref 0.50–1.10)
GFR calc Af Amer: >90 mL/min (ref >90)
GFR calc non Af Amer: >90 mL/min (ref >90)

## 2011-10-12 LAB — APTT: aPTT: 46 seconds — ABNORMAL HIGH (ref 24–37)

## 2011-10-12 LAB — POCT I-STAT, CHEM 8
BUN: 5 mg/dL — ABNORMAL LOW (ref 6–23)
HCT: 31 % — ABNORMAL LOW (ref 36.0–46.0)
Sodium: 147 mEq/L — ABNORMAL HIGH (ref 135–145)
TCO2: 24 mmol/L (ref 0–100)

## 2011-10-12 SURGERY — REPAIR, MITRAL VALVE
Anesthesia: General | Site: Chest | Wound class: Clean

## 2011-10-12 MED ORDER — SODIUM CHLORIDE 0.9 % IR SOLN
Status: DC | PRN
  Administered 2011-10-12: 5000 mL

## 2011-10-12 MED ORDER — HEMOSTATIC AGENTS (NO CHARGE) OPTIME
TOPICAL | Status: DC | PRN
  Administered 2011-10-12: 1 via TOPICAL

## 2011-10-12 MED ORDER — DEXMEDETOMIDINE HCL IN NACL 200 MCG/50ML IV SOLN: 0.1000 ug/kg/h | INTRAVENOUS | Status: DC

## 2011-10-12 MED ORDER — ASPIRIN 81 MG PO CHEW: 324.0000 mg | CHEWABLE_TABLET | Freq: Every day | ORAL | Status: DC

## 2011-10-12 MED ORDER — MIDAZOLAM HCL 2 MG/2ML IJ SOLN: 2.0000 mg | INTRAMUSCULAR | Status: DC | PRN

## 2011-10-12 MED ORDER — MIDAZOLAM HCL 5 MG/5ML IJ SOLN
INTRAMUSCULAR | Status: DC | PRN
  Administered 2011-10-12: 5 mg via INTRAVENOUS
  Administered 2011-10-12: 3 mg via INTRAVENOUS
  Administered 2011-10-12 (×2): 1 mg via INTRAVENOUS

## 2011-10-12 MED ORDER — LACTATED RINGERS IV SOLN
INTRAVENOUS | Status: DC | PRN
  Administered 2011-10-12 (×2): via INTRAVENOUS

## 2011-10-12 MED ORDER — CALCIUM CHLORIDE 10 % IV SOLN
INTRAVENOUS | Status: DC | PRN
  Administered 2011-10-12 (×2): 500 mg via INTRAVENOUS

## 2011-10-12 MED ORDER — SODIUM BICARBONATE 4.2 % IV SOLN
INTRAVENOUS | Status: DC | PRN
  Administered 2011-10-12: 50 meq via INTRAVENOUS

## 2011-10-12 MED ORDER — HEPARIN SODIUM (PORCINE) 1000 UNIT/ML IJ SOLN
INTRAMUSCULAR | Status: DC | PRN
  Administered 2011-10-12: 2000 [IU] via INTRAVENOUS
  Administered 2011-10-12: 16000 [IU] via INTRAVENOUS

## 2011-10-12 MED ORDER — VECURONIUM BROMIDE 10 MG IV SOLR
INTRAVENOUS | Status: DC | PRN
  Administered 2011-10-12 (×2): 2 mg via INTRAVENOUS
  Administered 2011-10-12: 8 mg via INTRAVENOUS
  Administered 2011-10-12: 2 mg via INTRAVENOUS

## 2011-10-12 MED ORDER — VANCOMYCIN HCL IN DEXTROSE 1-5 GM/200ML-% IV SOLN
1000.0000 mg | Freq: Once | INTRAVENOUS | Status: AC
  Administered 2011-10-12: 1000 mg via INTRAVENOUS
  Filled 2011-10-12: qty 200

## 2011-10-12 MED ORDER — FAMOTIDINE IN NACL 20-0.9 MG/50ML-% IV SOLN
20.0000 mg | Freq: Two times a day (BID) | INTRAVENOUS | Status: DC
  Administered 2011-10-12: 20 mg via INTRAVENOUS

## 2011-10-12 MED ORDER — PLASMA-LYTE A IV SOLN
INTRAVENOUS | Status: DC | PRN
  Administered 2011-10-12: 500 mL via INTRAVENOUS

## 2011-10-12 MED ORDER — SODIUM CHLORIDE 0.9 % IV SOLN
INTRAVENOUS | Status: DC
  Administered 2011-10-12: 20 mL/h via INTRAVENOUS

## 2011-10-12 MED ORDER — OXYCODONE HCL 5 MG PO TABS: 5.0000 mg | ORAL_TABLET | ORAL | Status: DC | PRN

## 2011-10-12 MED ORDER — SODIUM CHLORIDE 0.9 % IJ SOLN: 3.0000 mL | INTRAMUSCULAR | Status: DC | PRN

## 2011-10-12 MED ORDER — METOPROLOL TARTRATE 12.5 MG HALF TABLET
12.5000 mg | ORAL_TABLET | Freq: Two times a day (BID) | ORAL | Status: DC
  Filled 2011-10-12 (×3): qty 1

## 2011-10-12 MED ORDER — METOPROLOL TARTRATE 25 MG/10 ML ORAL SUSPENSION
12.5000 mg | Freq: Two times a day (BID) | ORAL | Status: DC
  Filled 2011-10-12 (×3): qty 5

## 2011-10-12 MED ORDER — ASPIRIN EC 325 MG PO TBEC
325.0000 mg | DELAYED_RELEASE_TABLET | Freq: Every day | ORAL | Status: DC
  Administered 2011-10-13: 325 mg via ORAL
  Filled 2011-10-12 (×2): qty 1

## 2011-10-12 MED ORDER — ACETAMINOPHEN 160 MG/5ML PO SOLN: 650.0000 mg | ORAL | Status: AC

## 2011-10-12 MED ORDER — DEXTROSE 5 % IV SOLN
0.0000 ug/min | INTRAVENOUS | Status: DC
  Filled 2011-10-12 (×2): qty 2

## 2011-10-12 MED ORDER — LACTATED RINGERS IV SOLN
INTRAVENOUS | Status: DC
  Administered 2011-10-12: 20 mL/h via INTRAVENOUS

## 2011-10-12 MED ORDER — SODIUM CHLORIDE 0.9 % IV SOLN: 250.0000 mL | INTRAVENOUS | Status: DC

## 2011-10-12 MED ORDER — POTASSIUM CHLORIDE 10 MEQ/50ML IV SOLN
10.0000 meq | INTRAVENOUS | Status: AC
  Administered 2011-10-12 (×3): 10 meq via INTRAVENOUS

## 2011-10-12 MED ORDER — POTASSIUM CHLORIDE 10 MEQ/50ML IV SOLN
10.0000 meq | INTRAVENOUS | Status: AC | PRN
  Administered 2011-10-12 – 2011-10-14 (×3): 10 meq via INTRAVENOUS
  Filled 2011-10-12: qty 100
  Filled 2011-10-12: qty 50
  Filled 2011-10-12: qty 150

## 2011-10-12 MED ORDER — MORPHINE SULFATE 2 MG/ML IJ SOLN
2.0000 mg | INTRAMUSCULAR | Status: DC | PRN
  Administered 2011-10-12 – 2011-10-13 (×2): 2 mg via INTRAVENOUS
  Administered 2011-10-13: 3 mg via INTRAVENOUS
  Administered 2011-10-13 (×2): 2 mg via INTRAVENOUS
  Administered 2011-10-13: 3 mg via INTRAVENOUS
  Filled 2011-10-12: qty 2
  Filled 2011-10-12: qty 1
  Filled 2011-10-12: qty 3
  Filled 2011-10-12 (×4): qty 1

## 2011-10-12 MED ORDER — LACTATED RINGERS IV SOLN
INTRAVENOUS | Status: DC | PRN
  Administered 2011-10-12 (×2): via INTRAVENOUS

## 2011-10-12 MED ORDER — ACETAMINOPHEN 160 MG/5ML PO SOLN: 975.0000 mg | Freq: Four times a day (QID) | ORAL | Status: DC

## 2011-10-12 MED ORDER — NITROGLYCERIN IN D5W 200-5 MCG/ML-% IV SOLN: 0.0000 ug/min | INTRAVENOUS | Status: DC

## 2011-10-12 MED ORDER — SODIUM CHLORIDE 0.45 % IV SOLN
INTRAVENOUS | Status: DC
  Administered 2011-10-12: 20 mL/h via INTRAVENOUS

## 2011-10-12 MED ORDER — MAGNESIUM SULFATE 40 MG/ML IJ SOLN
4.0000 g | Freq: Once | INTRAMUSCULAR | Status: AC
  Administered 2011-10-12: 4 g via INTRAVENOUS
  Filled 2011-10-12: qty 100

## 2011-10-12 MED ORDER — ACETAMINOPHEN 650 MG RE SUPP
650.0000 mg | RECTAL | Status: AC
  Administered 2011-10-12: 650 mg via RECTAL

## 2011-10-12 MED ORDER — DOCUSATE SODIUM 100 MG PO CAPS
200.0000 mg | ORAL_CAPSULE | Freq: Every day | ORAL | Status: DC
  Administered 2011-10-13 – 2011-10-18 (×5): 200 mg via ORAL
  Filled 2011-10-12 (×6): qty 2

## 2011-10-12 MED ORDER — SODIUM CHLORIDE 0.9 % IJ SOLN
3.0000 mL | Freq: Two times a day (BID) | INTRAMUSCULAR | Status: DC
  Administered 2011-10-13 (×2): 3 mL via INTRAVENOUS

## 2011-10-12 MED ORDER — ALBUMIN HUMAN 5 % IV SOLN
250.0000 mL | INTRAVENOUS | Status: AC | PRN
  Administered 2011-10-12: 250 mL via INTRAVENOUS

## 2011-10-12 MED ORDER — ONDANSETRON HCL 4 MG/2ML IJ SOLN
4.0000 mg | Freq: Four times a day (QID) | INTRAMUSCULAR | Status: DC | PRN
  Administered 2011-10-12: 4 mg via INTRAVENOUS
  Filled 2011-10-12: qty 2

## 2011-10-12 MED ORDER — SODIUM CHLORIDE 0.9 % IJ SOLN
OROMUCOSAL | Status: DC | PRN
  Administered 2011-10-12: 09:00:00 via TOPICAL

## 2011-10-12 MED ORDER — DOPAMINE-DEXTROSE 3.2-5 MG/ML-% IV SOLN: 0.0000 ug/kg/min | INTRAVENOUS | Status: DC

## 2011-10-12 MED ORDER — MOXIFLOXACIN HCL IN NACL 400 MG/250ML IV SOLN
400.0000 mg | INTRAVENOUS | Status: AC
  Administered 2011-10-13: 400 mg via INTRAVENOUS
  Filled 2011-10-12: qty 250

## 2011-10-12 MED ORDER — PROTAMINE SULFATE 10 MG/ML IV SOLN
INTRAVENOUS | Status: DC | PRN
  Administered 2011-10-12: 150 mg via INTRAVENOUS

## 2011-10-12 MED ORDER — SODIUM CHLORIDE 0.9 % IV SOLN
INTRAVENOUS | Status: DC
  Administered 2011-10-12: 2.2 [IU]/h via INTRAVENOUS
  Filled 2011-10-12: qty 1

## 2011-10-12 MED ORDER — BISACODYL 5 MG PO TBEC
10.0000 mg | DELAYED_RELEASE_TABLET | Freq: Every day | ORAL | Status: DC
  Administered 2011-10-13 – 2011-10-18 (×4): 10 mg via ORAL
  Filled 2011-10-12 (×4): qty 2

## 2011-10-12 MED ORDER — SODIUM CHLORIDE 0.9 % IR SOLN
Status: DC | PRN
  Administered 2011-10-12: 3000 mL

## 2011-10-12 MED ORDER — BISACODYL 10 MG RE SUPP: 10.0000 mg | Freq: Every day | RECTAL | Status: DC

## 2011-10-12 MED ORDER — INSULIN REGULAR BOLUS VIA INFUSION
0.0000 [IU] | Freq: Three times a day (TID) | INTRAVENOUS | Status: DC
  Filled 2011-10-12: qty 10

## 2011-10-12 MED ORDER — LACTATED RINGERS IV SOLN
INTRAVENOUS | Status: DC | PRN
  Administered 2011-10-12: 07:00:00 via INTRAVENOUS

## 2011-10-12 MED ORDER — SUFENTANIL CITRATE 50 MCG/ML IV SOLN
INTRAVENOUS | Status: DC | PRN
  Administered 2011-10-12 (×2): 20 ug via INTRAVENOUS
  Administered 2011-10-12: 10 ug via INTRAVENOUS
  Administered 2011-10-12: 25 ug via INTRAVENOUS
  Administered 2011-10-12: 5 ug via INTRAVENOUS

## 2011-10-12 MED ORDER — CALCIUM CHLORIDE 10 % IV SOLN
1.0000 g | Freq: Once | INTRAVENOUS | Status: AC | PRN
  Filled 2011-10-12: qty 10

## 2011-10-12 MED ORDER — MORPHINE SULFATE 2 MG/ML IJ SOLN
1.0000 mg | INTRAMUSCULAR | Status: AC | PRN
  Administered 2011-10-12 (×4): 1 mg via INTRAVENOUS

## 2011-10-12 MED ORDER — DOPAMINE-DEXTROSE 1.6-5 MG/ML-% IV SOLN
INTRAVENOUS | Status: DC | PRN
  Administered 2011-10-12: 5 ug/kg/min via INTRAVENOUS

## 2011-10-12 MED ORDER — INSULIN ASPART 100 UNIT/ML ~~LOC~~ SOLN
0.0000 [IU] | SUBCUTANEOUS | Status: AC
  Administered 2011-10-13 (×2): 2 [IU] via SUBCUTANEOUS

## 2011-10-12 MED ORDER — INSULIN ASPART 100 UNIT/ML ~~LOC~~ SOLN: 0.0000 [IU] | SUBCUTANEOUS | Status: DC

## 2011-10-12 MED ORDER — ACETAMINOPHEN 500 MG PO TABS
1000.0000 mg | ORAL_TABLET | Freq: Four times a day (QID) | ORAL | Status: AC
  Administered 2011-10-13 – 2011-10-17 (×13): 1000 mg via ORAL
  Filled 2011-10-12 (×18): qty 2

## 2011-10-12 MED ORDER — PROPOFOL 10 MG/ML IV BOLUS
INTRAVENOUS | Status: DC | PRN
  Administered 2011-10-12: 70 mg via INTRAVENOUS

## 2011-10-12 MED ORDER — PANTOPRAZOLE SODIUM 40 MG PO TBEC
40.0000 mg | DELAYED_RELEASE_TABLET | Freq: Every day | ORAL | Status: DC
  Administered 2011-10-15 – 2011-10-19 (×4): 40 mg via ORAL
  Filled 2011-10-12 (×4): qty 1

## 2011-10-12 MED ORDER — METOPROLOL TARTRATE 1 MG/ML IV SOLN: 2.5000 mg | INTRAVENOUS | Status: DC | PRN

## 2011-10-12 SURGICAL SUPPLY — 156 items
ADAPTER CARDIO PERF ANTE/RETRO (ADAPTER) ×2 IMPLANT
ADPR PRFSN 84XANTGRD RTRGD (ADAPTER) ×1
APL SKNCLS STERI-STRIP NONHPOA (GAUZE/BANDAGES/DRESSINGS) ×1
APPLICATOR COTTON TIP 6IN STRL (MISCELLANEOUS) IMPLANT
APPLIER CLIP 9.375 MED OPEN (MISCELLANEOUS)
APPLIER CLIP 9.375 SM OPEN (CLIP)
APR CLP MED 9.3 20 MLT OPN (MISCELLANEOUS)
APR CLP SM 9.3 20 MLT OPN (CLIP)
ATTRACTOMAT 16X20 MAGNETIC DRP (DRAPES) ×2 IMPLANT
BAG DECANTER FOR FLEXI CONT (MISCELLANEOUS) ×2 IMPLANT
BANDAGE ELASTIC 4 VELCRO ST LF (GAUZE/BANDAGES/DRESSINGS) ×2 IMPLANT
BANDAGE ELASTIC 6 VELCRO ST LF (GAUZE/BANDAGES/DRESSINGS) ×2 IMPLANT
BANDAGE GAUZE ELAST BULKY 4 IN (GAUZE/BANDAGES/DRESSINGS) ×2 IMPLANT
BASKET HEART (ORDER IN 25'S) (MISCELLANEOUS) ×1
BASKET HEART (ORDER IN 25S) (MISCELLANEOUS) ×1 IMPLANT
BENZOIN TINCTURE PRP APPL 2/3 (GAUZE/BANDAGES/DRESSINGS) ×2 IMPLANT
BLADE STERNUM SYSTEM 6 (BLADE) ×4 IMPLANT
BLADE SURG 11 STRL SS (BLADE) ×2 IMPLANT
BLADE SURG ROTATE 9660 (MISCELLANEOUS) IMPLANT
CANISTER SUCTION 2500CC (MISCELLANEOUS) ×4 IMPLANT
CANN PRFSN 3/8X14X24FR PCFC (MISCELLANEOUS)
CANN PRFSN 3/8XCNCT ST RT ANG (MISCELLANEOUS)
CANNULA FEM VENOUS REMOTE 22FR (CANNULA) ×2 IMPLANT
CANNULA GUNDRY RCSP 15FR (MISCELLANEOUS) ×2 IMPLANT
CANNULA PRFSN 3/8X14X24FR PCFC (MISCELLANEOUS) IMPLANT
CANNULA PRFSN 3/8XCNCT RT ANG (MISCELLANEOUS) IMPLANT
CANNULA VEN MTL TIP RT (MISCELLANEOUS)
CATH CPB KIT OWEN (MISCELLANEOUS) ×2 IMPLANT
CATH FOLEY 2WAY SLVR  5CC 14FR (CATHETERS)
CATH FOLEY 2WAY SLVR 5CC 14FR (CATHETERS) IMPLANT
CATH THORACIC 28FR (CATHETERS) ×8 IMPLANT
CATH THORACIC 28FR RT ANG (CATHETERS) IMPLANT
CATH THORACIC 36FR (CATHETERS) ×2 IMPLANT
CATH THORACIC 36FR RT ANG (CATHETERS) IMPLANT
CLIP APPLIE 9.375 MED OPEN (MISCELLANEOUS) IMPLANT
CLIP APPLIE 9.375 SM OPEN (CLIP) IMPLANT
CLIP FOGARTY SPRING 6M (CLIP) IMPLANT
CLIP RETRACTION 3.0MM CORONARY (MISCELLANEOUS) ×2 IMPLANT
CLIP TI MEDIUM 24 (CLIP) IMPLANT
CLIP TI WIDE RED SMALL 24 (CLIP) IMPLANT
CLOTH BEACON ORANGE TIMEOUT ST (SAFETY) ×2 IMPLANT
CONN 1/2X1/2X1/2  BEN (MISCELLANEOUS) ×2
CONN 1/2X1/2X1/2 BEN (MISCELLANEOUS) ×2 IMPLANT
CONN 3/8X1/2 ST GISH (MISCELLANEOUS) ×6 IMPLANT
CONN Y 3/8X3/8X3/8  BEN (MISCELLANEOUS)
CONN Y 3/8X3/8X3/8 BEN (MISCELLANEOUS) IMPLANT
CONT SPEC 4OZ CLIKSEAL STRL BL (MISCELLANEOUS) ×4 IMPLANT
COVER SURGICAL LIGHT HANDLE (MISCELLANEOUS) ×6 IMPLANT
CRADLE DONUT ADULT HEAD (MISCELLANEOUS) ×2 IMPLANT
DRAIN CHANNEL 28F RND 3/8 FF (WOUND CARE) ×4 IMPLANT
DRAIN CHANNEL 32F RND 10.7 FF (WOUND CARE) ×2 IMPLANT
DRAPE CARDIOVASCULAR INCISE (DRAPES) ×2
DRAPE INCISE IOBAN 66X45 STRL (DRAPES) ×2 IMPLANT
DRAPE SLUSH/WARMER DISC (DRAPES) ×2 IMPLANT
DRAPE SRG 135X102X78XABS (DRAPES) ×1 IMPLANT
DRSG COVADERM 4X14 (GAUZE/BANDAGES/DRESSINGS) ×2 IMPLANT
ELECT REM PT RETURN 9FT ADLT (ELECTROSURGICAL) ×4
ELECTRODE REM PT RTRN 9FT ADLT (ELECTROSURGICAL) ×2 IMPLANT
GLOVE BIO SURGEON STRL SZ 6 (GLOVE) IMPLANT
GLOVE BIO SURGEON STRL SZ 6.5 (GLOVE) ×4 IMPLANT
GLOVE BIO SURGEON STRL SZ7 (GLOVE) IMPLANT
GLOVE BIO SURGEON STRL SZ7.5 (GLOVE) ×4 IMPLANT
GLOVE BIOGEL PI IND STRL 6 (GLOVE) ×4 IMPLANT
GLOVE BIOGEL PI IND STRL 6.5 (GLOVE) IMPLANT
GLOVE BIOGEL PI IND STRL 7.0 (GLOVE) ×5 IMPLANT
GLOVE BIOGEL PI INDICATOR 6 (GLOVE) ×4
GLOVE BIOGEL PI INDICATOR 6.5 (GLOVE)
GLOVE BIOGEL PI INDICATOR 7.0 (GLOVE) ×5
GLOVE EUDERMIC 7 POWDERFREE (GLOVE) IMPLANT
GLOVE ORTHO TXT STRL SZ7.5 (GLOVE) ×4 IMPLANT
GOWN PREVENTION PLUS XLARGE (GOWN DISPOSABLE) ×12 IMPLANT
GOWN STRL NON-REIN LRG LVL3 (GOWN DISPOSABLE) ×14 IMPLANT
HEMOSTAT POWDER SURGIFOAM 1G (HEMOSTASIS) ×6 IMPLANT
INSERT FOGARTY 61MM (MISCELLANEOUS) IMPLANT
INSERT FOGARTY XLG (MISCELLANEOUS) ×2 IMPLANT
IV NS IRRIG 3000ML ARTHROMATIC (IV SOLUTION) ×2 IMPLANT
KIT BASIN OR (CUSTOM PROCEDURE TRAY) ×4 IMPLANT
KIT DILATOR VASC 18G NDL (KITS) ×2 IMPLANT
KIT DRAINAGE VACCUM ASSIST (KITS) ×2 IMPLANT
KIT PAIN CUSTOM (MISCELLANEOUS) IMPLANT
KIT ROOM TURNOVER OR (KITS) ×2 IMPLANT
KIT SUCTION CATH 14FR (SUCTIONS) ×10 IMPLANT
KIT VASOVIEW W/TROCAR VH 2000 (KITS) ×2 IMPLANT
LEAD PACING MYOCARDI (MISCELLANEOUS) ×2 IMPLANT
LINE VENT (MISCELLANEOUS) ×2 IMPLANT
MARKER GRAFT CORONARY BYPASS (MISCELLANEOUS) ×6 IMPLANT
NS IRRIG 1000ML POUR BTL (IV SOLUTION) ×12 IMPLANT
PACK OPEN HEART (CUSTOM PROCEDURE TRAY) ×2 IMPLANT
PAD ARMBOARD 7.5X6 YLW CONV (MISCELLANEOUS) ×6 IMPLANT
PENCIL BUTTON HOLSTER BLD 10FT (ELECTRODE) ×2 IMPLANT
PUNCH AORTIC ROTATE 4.0MM (MISCELLANEOUS) ×2 IMPLANT
PUNCH AORTIC ROTATE 4.5MM 8IN (MISCELLANEOUS) IMPLANT
PUNCH AORTIC ROTATE 5MM 8IN (MISCELLANEOUS) IMPLANT
RING MITRAL MEMO 3D 26MM SMD26 (Prosthesis & Implant Heart) ×2 IMPLANT
SENSOR MYOCARDIAL TEMP (MISCELLANEOUS) ×2 IMPLANT
SET CARDIOPLEGIA MPS 5001102 (MISCELLANEOUS) ×2 IMPLANT
SET IRRIG TUBING LAPAROSCOPIC (IRRIGATION / IRRIGATOR) ×2 IMPLANT
SOLUTION ANTI FOG 6CC (MISCELLANEOUS) ×2 IMPLANT
SPONGE GAUZE 4X4 12PLY (GAUZE/BANDAGES/DRESSINGS) ×2 IMPLANT
SPONGE LAP 18X18 X RAY DECT (DISPOSABLE) ×4 IMPLANT
SPONGE LAP 4X18 X RAY DECT (DISPOSABLE) IMPLANT
SUCKER INTRACARDIAC WEIGHTED (SUCKER) ×2 IMPLANT
SUT BONE WAX W31G (SUTURE) ×2 IMPLANT
SUT ETHIBOND (SUTURE) ×6 IMPLANT
SUT ETHIBOND 2 0 SH (SUTURE) ×4 IMPLANT
SUT ETHIBOND 2 0 SH 36X2 (SUTURE) ×2 IMPLANT
SUT ETHIBOND 2 0 V4 (SUTURE) IMPLANT
SUT ETHIBOND 2 0V4 GREEN (SUTURE) IMPLANT
SUT ETHIBOND 2-0 RB-1 WHT (SUTURE) ×6 IMPLANT
SUT ETHIBOND 4 0 TF (SUTURE) IMPLANT
SUT ETHIBOND 5 0 C 1 30 (SUTURE) ×2 IMPLANT
SUT ETHIBOND NAB MH 2-0 36IN (SUTURE) ×2 IMPLANT
SUT ETHIBOND X763 2 0 SH 1 (SUTURE) ×8 IMPLANT
SUT GORETEX CV-5THC-13 36IN (SUTURE) ×18 IMPLANT
SUT GORETEX CV4 TH-18 (SUTURE) ×4 IMPLANT
SUT MNCRL AB 3-0 PS2 18 (SUTURE) ×6 IMPLANT
SUT MNCRL AB 4-0 PS2 18 (SUTURE) IMPLANT
SUT PDS AB 1 CTX 36 (SUTURE) ×4 IMPLANT
SUT PROLENE 2 0 SH DA (SUTURE) IMPLANT
SUT PROLENE 3 0 SH 1 (SUTURE) ×2 IMPLANT
SUT PROLENE 3 0 SH DA (SUTURE) ×4 IMPLANT
SUT PROLENE 3 0 SH1 36 (SUTURE) ×8 IMPLANT
SUT PROLENE 4 0 RB 1 (SUTURE) ×8
SUT PROLENE 4 0 SH DA (SUTURE) ×6 IMPLANT
SUT PROLENE 4-0 RB1 .5 CRCL 36 (SUTURE) ×4 IMPLANT
SUT PROLENE 5 0 C 1 36 (SUTURE) IMPLANT
SUT PROLENE 6 0 C 1 30 (SUTURE) ×8 IMPLANT
SUT PROLENE 7.0 RB 3 (SUTURE) ×8 IMPLANT
SUT PROLENE 8 0 BV175 6 (SUTURE) IMPLANT
SUT PROLENE BLUE 7 0 (SUTURE) ×2 IMPLANT
SUT PROLENE POLY MONO (SUTURE) IMPLANT
SUT SILK  1 MH (SUTURE) ×9
SUT SILK 1 MH (SUTURE) ×9 IMPLANT
SUT STEEL 6MS V (SUTURE) ×4 IMPLANT
SUT STEEL STERNAL CCS#1 18IN (SUTURE) IMPLANT
SUT STEEL SZ 6 DBL 3X14 BALL (SUTURE) IMPLANT
SUT VIC AB 1 CTX 36 (SUTURE)
SUT VIC AB 1 CTX36XBRD ANBCTR (SUTURE) IMPLANT
SUT VIC AB 2-0 CT1 27 (SUTURE) ×2
SUT VIC AB 2-0 CT1 TAPERPNT 27 (SUTURE) ×1 IMPLANT
SUT VIC AB 2-0 CTX 27 (SUTURE) IMPLANT
SUT VIC AB 2-0 UR6 27 (SUTURE) ×2 IMPLANT
SUT VIC AB 3-0 SH 27 (SUTURE)
SUT VIC AB 3-0 SH 27X BRD (SUTURE) IMPLANT
SUT VIC AB 3-0 X1 27 (SUTURE) IMPLANT
SUT VICRYL 4-0 PS2 18IN ABS (SUTURE) IMPLANT
SUTURE E-PAK OPEN HEART (SUTURE) ×2 IMPLANT
SYSTEM SAHARA CHEST DRAIN ATS (WOUND CARE) ×4 IMPLANT
TAPE CLOTH SURG 6X10 WHT LF (GAUZE/BANDAGES/DRESSINGS) ×2 IMPLANT
TOWEL OR 17X24 6PK STRL BLUE (TOWEL DISPOSABLE) ×6 IMPLANT
TOWEL OR 17X26 10 PK STRL BLUE (TOWEL DISPOSABLE) ×4 IMPLANT
TRAY FOLEY IC TEMP SENS 14FR (CATHETERS) ×2 IMPLANT
TUBE SUCT INTRACARD DLP 20F (MISCELLANEOUS) ×2 IMPLANT
TUBING INSUFFLATION 10FT LAP (TUBING) ×2 IMPLANT
UNDERPAD 30X30 INCONTINENT (UNDERPADS AND DIAPERS) ×2 IMPLANT
WATER STERILE IRR 1000ML POUR (IV SOLUTION) ×4 IMPLANT

## 2011-10-12 NOTE — OR Nursing (Signed)
13:35 - 1st call to SICU, call to vol. Desk to inform family off pump.  2nd call to SICU @ 14:10pm

## 2011-10-12 NOTE — Anesthesia Preprocedure Evaluation (Addendum)
Anesthesia Evaluation  Patient identified by MRN, date of birth, ID band Patient awake    Reviewed: Allergy & Precautions, H&P , NPO status , Patient's Chart, lab work & pertinent test results  Airway Mallampati: II TM Distance: >3 FB Neck ROM: Full    Dental  (+) Teeth Intact   Pulmonary  breath sounds clear to auscultation        Cardiovascular hypertension, Pt. on medications and Pt. on home beta blockers + angina + CAD and +CHF + dysrhythmias Rhythm:Regular + Systolic murmurs    Neuro/Psych    GI/Hepatic   Endo/Other    Renal/GU      Musculoskeletal   Abdominal   Peds  Hematology   Anesthesia Other Findings   Reproductive/Obstetrics                         Anesthesia Physical Anesthesia Plan  ASA: III  Anesthesia Plan: General   Post-op Pain Management:    Induction: Intravenous  Airway Management Planned: Oral ETT  Additional Equipment: Arterial line, PA Cath, 3D TEE and CVP  Intra-op Plan:   Post-operative Plan: Post-operative intubation/ventilation  Informed Consent: I have reviewed the patients History and Physical, chart, labs and discussed the procedure including the risks, benefits and alternatives for the proposed anesthesia with the patient or authorized representative who has indicated his/her understanding and acceptance.   Dental advisory given  Plan Discussed with: CRNA, Surgeon and Anesthesiologist  Anesthesia Plan Comments:        Anesthesia Quick Evaluation

## 2011-10-12 NOTE — Anesthesia Postprocedure Evaluation (Signed)
  Anesthesia Post-op Note  Patient: Victoria Sexton  Procedure(s) Performed: Procedure(s) (LRB) with comments: MITRAL VALVE REPAIR (MVR) (N/A) - MITRAL VALVE REPAIR CORONARY ARTERY BYPASS GRAFTING (CABG) (N/A) - RESECTION OF LV MASS PATENT FORAMEN OVALE CLOSURE (N/A)  Patient Location: SICU  Anesthesia Type: General  Level of Consciousness: sedated, unresponsive and Patient remains intubated per anesthesia plan  Airway and Oxygen Therapy: Patient remains intubated per anesthesia plan and Patient placed on Ventilator (see vital sign flow sheet for setting)  Post-op Pain: none  Post-op Assessment: Post-op Vital signs reviewed and Patient's Cardiovascular Status Stable  Post-op Vital Signs: stable  Complications: No apparent anesthesia complications

## 2011-10-12 NOTE — Progress Notes (Signed)
Patient ID: Victoria Sexton, female   DOB: 1927-10-07, 78 y.o.   MRN: [id number]  SICU evening rounds:  Hemodynamically stable. CI = 2.4 on dop 2 A-paced at 90  Extubated in 3 hrs.  Good urine output  CT output low, serosanguinous.

## 2011-10-12 NOTE — Transfer of Care (Signed)
Immediate Anesthesia Transfer of Care Note  Patient: Victoria Sexton  Procedure(s) Performed: Procedure(s) (LRB) with comments: MITRAL VALVE REPAIR (MVR) (N/A) - MITRAL VALVE REPAIR CORONARY ARTERY BYPASS GRAFTING (CABG) (N/A) - RESECTION OF LV MASS PATENT FORAMEN OVALE CLOSURE (N/A)  Patient Location: ICU  Anesthesia Type: General  Level of Consciousness: unresponsive  Airway & Oxygen Therapy: Patient remains intubated per anesthesia plan  Post-op Assessment: Report given to PACU RN and Post -op Vital signs reviewed and stable  Post vital signs: Reviewed and stable  Complications: No apparent anesthesia complications

## 2011-10-12 NOTE — Anesthesia Postprocedure Evaluation (Signed)
  Anesthesia Post-op Note  Patient: Victoria Sexton  Procedure(s) Performed: Procedure(s) (LRB) with comments: MITRAL VALVE REPAIR (MVR) (N/A) - MITRAL VALVE REPAIR CORONARY ARTERY BYPASS GRAFTING (CABG) (N/A) - RESECTION OF LV MASS PATENT FORAMEN OVALE CLOSURE (N/A)  Patient Location: ICU  Anesthesia Type: General  Level of Consciousness: unresponsive  Airway and Oxygen Therapy: Patient remains intubated per anesthesia plan and Patient placed on Ventilator (see vital sign flow sheet for setting)  Post-op Pain: none  Post-op Assessment: Post-op Vital signs reviewed, Patient's Cardiovascular Status Stable, Respiratory Function Stable and No signs of Nausea or vomiting  Post-op Vital Signs: Reviewed and stable  Complications: No apparent anesthesia complications

## 2011-10-12 NOTE — Progress Notes (Signed)
  Echocardiogram Echocardiogram Transesophageal has been performed.  Victoria Sexton 10/12/2011, 8:52 AM

## 2011-10-12 NOTE — Brief Op Note (Signed)
10/12/2011  2:09 PM  PATIENT:  Delora Fuel  78 y.o. 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

## 2011-10-12 NOTE — Procedures (Signed)
Extubation Procedure Note  Patient Details:   Name: Victoria Sexton DOB: Dec 10, 1927 MRN: [id number]   Airway Documentation:     Evaluation  O2 sats: stable throughout Complications: No apparent complications Patient did tolerate procedure well. Bilateral Breath Sounds: Clear   Yes  Newt Lukes 10/12/2011, 6:27 PM

## 2011-10-12 NOTE — Progress Notes (Signed)
ETT on arrival at 22 cm

## 2011-10-12 NOTE — Interval H&P Note (Signed)
History and Physical Interval Note:  10/12/2011 7:40 AM  Victoria Sexton  has presented today for surgery, with the diagnosis of MITRAL REGURGITATION, CAD  The various methods of treatment have been discussed with the patient and family. After consideration of risks, benefits and other options for treatment, the patient has consented to  Procedure(s) (LRB) with comments: MITRAL VALVE REPAIR (MVR) (N/A) - MITRAL VALVE REPAIR OR REPLACEMENT CORONARY ARTERY BYPASS GRAFTING (CABG) (N/A) - RESECTION OF LV MASS as a surgical intervention .  The patient's history has been reviewed, patient examined, no change in status, stable for surgery.  I have reviewed the patient's chart and labs.  Questions were answered to the patient's satisfaction.     [name],[name]

## 2011-10-12 NOTE — Op Note (Addendum)
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  Patent Foramen Ovale  Procedure:    Mitral Valve Repair Complex valvuloplasty including triangular resection of posterior leaflet Artificial Goretex neocord placement x2 Sorin Memo 3D ring annuloplasty (size 26 mm, catalog #[id number], serial #[id number])   Coronary Artery Bypass Grafting x 3  Left Internal Mammary Artery to Distal Left Anterior Descending Coronary Artery Saphenous Vein Graft to Distal Right Coronary Artery Saphenous Vein Graft to Obtuse Marginal Branch of Left Circumflex Coronary Artery Endoscopic Vein Harvest from Left Thigh and Lower Leg   Resection of Cardiac Tumor Benign-appearing endocardial mass in LV outflow tract   Closure of Patent Foramen Ovale    Surgeon: Salvatore Decent. 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 Up until recently the patient has remained asymptomatic, and because of this she has previously declined elective mitral valve repair and resection of the small mass in the left ventricular outflow tract.  However, two weeks previously she was hospitalized for acute exacerbation of class IV congestive heart failure with pulmonary edema and resting shortness of breath.  Symptoms improved rapidly with diuretic therapy.  She underwent left and right heart catheterization by Dr. Anne Fu and was found to have severe three-vessel coronary artery disease.  The patient has been seen in consultation and counseled at length regarding the indications, risks and potential benefits of surgery.  All questions have been answered, and the patient provides full informed consent for the operation as described.    DETAILS OF THE OPERATIVE PROCEDURE  The patient is brought to the operating room on the above mentioned date and central monitoring was established by the anesthesia team including placement of Swan-Ganz catheter and radial arterial line. The patient is placed in the supine position on the operating table.  Intravenous antibiotics are administered. General endotracheal anesthesia is induced uneventfully. A Foley catheter is placed.  Baseline transesophageal echocardiogram was performed.  Findings were notable for mild left ventricular chamber enlargement with mild left ventricular dysfunction. There is mitral valve prolapse with obvious flail segment of the middle scallop of the posterior leaflet with severe mitral regurgitation. There was a small benign appearing mass along the endocardial surface  of the left ventricular output tract. There was left atrial enlargement. There was  only trace tricuspid regurgitation. There was a prominent patent foramen ovale.  The patient's chest, abdomen, both groins, and both lower extremities are prepared and draped in a sterile manner. A time out procedure is performed.  A median sternotomy incision was performed and the left internal mammary artery is dissected from the chest wall and prepared for bypass grafting. The left internal mammary artery is notably good quality conduit. Simultaneously, saphenous vein is obtained from the patient's left thigh and leg using endoscopic vein harvest technique. The saphenous vein is notably good quality conduit. After removal of the saphenous vein, the small surgical incisions in the lower extremity are closed with absorbable suture. Following systemic heparinization, the left internal mammary artery was transected distally noted to have excellent flow.  The pericardium is opened. The ascending aorta is normal in appearance. The right common femoral vein is cannulated using the Seldinger technique and a guidewire advanced into the right atrium using TEE guidance.  The patient is heparinized systemically and the right common femoral vein cannulated using a 22 Fr long femoral venous cannula.  The ascending aorta is cannulated for cardiopulmonary bypass.  Adequate heparinization is verified.   A retrograde cardioplegia cannula is placed through the right atrium into the coronary sinus.   The entire pre-bypass portion of the operation was notable for stable hemodynamics.  Cardiopulmonary bypass was begun and the surface of the heart is inspected.  A second venous cannula is placed directly into the superior vena cava.  Distal target vessels are selected for coronary artery bypass grafting.  A cardioplegia cannula is placed in the ascending aorta.  A temperature probe was placed in the interventricular septum.  The patient is cooled to 32C systemic temperature.  The aortic cross clamp is applied and cold  blood cardioplegia is delivered initially in an antegrade fashion through the aortic root.   Supplemental cardioplegia is given retrograde through the coronary sinus catheter.  Iced saline slush is applied for topical hypothermia.  The initial cardioplegic arrest is rapid with early diastolic arrest.  Repeat doses of cardioplegia are administered intermittently throughout the entire cross clamp portion of the operation through the aortic root, through the coronary sinus catheter, and through subsequently placed vein grafts in order to maintain completely flat electrocardiogram and septal myocardial temperature below 15C.  Myocardial protection was felt to be excellent.  The following distal coronary artery bypass grafts were performed:   The distal right coronary artery was grafted using a reversed saphenous vein graft in an end-to-side fashion.  At the site of distal anastomosis the target vessel was fair quality and measured approximately 1.3 mm in diameter.  The obtuse marginal branch of the left circumflex coronary artery was grafted using a reversed saphenous vein graft in an end-to-side fashion.  At the site of distal anastomosis the target vessel was good quality and measured approximately 1.5 mm in diameter.  The distal left anterior coronary artery was grafted with the left internal mammary artery in an end-to-side fashion.  At the site of distal anastomosis the target vessel was good quality and measured approximately 1.7 mm in diameter.  A left atriotomy incision was performed through the interatrial groove and extended partially across the back wall of the left atrium after opening the oblique sinus inferiorly.  A patent foramen ovale was obviously appreciated in the usual location along the intra-atrial septum. The patent foramen ovale was closed with running  5-0 Prolene suture.  The mitral valve is exposed using a self-retaining retractor.  The mitral valve was inspected and notable for  fibroelastic deficiency type degenerative disease with an obvious small flail portion of the middle scallop (P2) of the posterior leaflet. There was moderate calcification of the posterior annulus particularly in the P1 and P2 region. The calcification did not extend into the posterior leaflet itself. There were no other areas of prolapse.  The anterior leaflet of the mitral valve was gently retracted to expose the anterior of the left ventricle and the surface of the interventricular septum and left ventricular output tract. One could appreciate a small benign appearing mass emanating from the septal bulge of the interventricular septum. This mass was excised with scissors and sent to pathology. Gross characteristics were suggestive of either benign fibrous tissue or perhaps a small papillary fibroelastoma.    Interrupted 2-0 Ethibond horizontal mattress sutures were placed circumferentially around the entire mitral annulus.  These sutures will ultimately be utilized for ring annuloplasty, and at this juncture they are utilized to suspend the valve symmetrically.  The small area of prolapse with flail segment of the middle scallop of the posterior leaflet is repaired using a small triangular resection comprising approximately 10-15% of the surface area of the P2 scallop. The intervening vertical defect of the posterior leaflet was closed using interrupted everting CV 5 Gore-Tex sutures.   The valve was sized to accept a 26 mm annuloplasty ring based upon the overall surface area of the anterior leaflet as well as the distance between the anterior and posterior commissures and the vertical height of the anterior leaflet.    A Sorin Memo 3D annuloplasty ring (size 26mm, Catalog [id number], serial [id number]) was implanted uneventfully.    At this juncture the valve was tested with saline and appeared to be competent. However, the P2 scallop appeared slightly tall and the primary cords to the P2 scallop remains  somewhat frail.  A pair of artificial Gore-Tex neocords were placed to further support the P2 scallop of the posterior leaflet. A CV 4 Gore-Tex suture is placed to the head of the anterior papillary muscle in a figure-of-eight fashion. Each limb of the Gore-Tex suture was then woven into the P2 scallop of the posterior leaflet by placing the suture initially from the ventricular to the atrial surface close to the free margin on and then woven in a diamond-shaped fashion towards the posterior annulus. The suture was tied while the heart was filled with saline to adjust the suture line. This corrected residual bulging the P2 and further supported the posterior leaflet with diminished overall height of the posterior leaflet.  After completion of the valve repair the valve was tested with saline and appeared to be perfectly competant.  There was a broad, symmetrical line of coaptation between the anterior and posterior leaflets and no residual mitral regurgitation.  Rewarming was begun.  The atriotomy was closed using a 2-layer closure of running 3-0 Prolene suture after placing a sump drain across the mitral valve to serve as a left ventricular vent.  All proximal vein graft anastomoses were placed directly to the ascending aorta prior to removal of the aortic cross clamp.  The septal myocardial temperature rose rapidly after reperfusion of the left internal mammary artery graft.  One final dose of warm retrograde "hot shot" cardioplegia was administered retrograde through the coronary sinus catheter while all air was evacuated through the aortic root.  The aortic cross clamp was removed after  a total cross clamp time of 147 minutes.  All proximal and distal coronary anastomoses were inspected for hemostasis and appropriate graft orientation.  Epicardial pacing wires are fixed to the right ventricular outflow tract and to the right atrial appendage. The patient is rewarmed to 37C temperature. The aortic and left  ventricular vents are removed.  The patient is weaned and disconnected from cardiopulmonary bypass.  The patient's rhythm at separation from bypass was AV paced.  The patient was weaned from cardioplegic bypass without any inotropic support. Total cardiopulmonary bypass time for the operation was 177 minutes.  Followup transesophageal echocardiogram performed after separation from bypass revealed a well-seated mitral annuloplasty ring and a mitral valve that was functioning normally and without any residual mitral regurgitation.  Left ventricular function was unchanged from preoperatively.  There was no residual intra-atrial communication. The previously seen mass in the left ventricular output tract was gone.  The aortic and superior vena cava cannula were removed uneventfully. Protamine was administered to reverse the anticoagulation. The femoral venous cannula was removed and manual pressure held on the groin for 30 minutes.  The mediastinum and pleural space were inspected for hemostasis and irrigated with saline solution. The mediastinum and both pleural spaces were drained using 4 chest tubes placed through separate stab incisions inferiorly.  The soft tissues anterior to the aorta were reapproximated loosely. The sternum is closed with double strength sternal wire. The soft tissues anterior to the sternum were closed in multiple layers and the skin is closed with a running subcuticular skin closure.  The patient tolerated the procedure well and is transported to the surgical intensive care in stable condition. There are no intraoperative complications. All sponge instrument and needle counts are verified correct at completion of the operation.  The patient received 2 units packed red blood cells during cardioplegia bypass due to anemia which was present preoperatively and exacerbated by hemodilution. The patient received 2 packs of adult platelets and 2 units fresh frozen plasma after reversal of  heparin with protamine due to coagulopathy with thrombocytopenia.  The post-bypass portion of the operation was notable for stable rhythm and hemodynamics.      Salvatore Decent. Cornelius Moras MD 10/12/2011 2:11 PM

## 2011-10-12 NOTE — CV Procedure (Signed)
Intraoperative Transesophageal Echocardiography Note:  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 She also has severe three-vessel coronary artery disease and was recently admitted with acute congestive heart failure. She is now scheduled to undergo coronary artery bypass grafting, mitral valve repair, and possible resection of the left ventricular outflow tract mass by Dr. Cornelius Moras. Operative transesophageal echocardiography was requested to assist with the procedure and to serve as a monitor for intraoperative volume status.  The patient was brought to the operating room at [facility] and general anesthesia was induced without difficulty. Following endotracheal intubation and orogastric suctioning, the transesophageal echocardiography probe was inserted into the esophagus without difficulty.   Impression: Pre-bypass findings:  1. Mitral valve: There was severe mitral regurgitation. This was due to to a flail segment involving the P2 region of the posterior leaflet. The jet of mitral regurgitation was anteriorly and centrally directed. The more severe jet  was anteriorly directed and tracking along the anterior wall of the left atrium. There was moderate to severe mitral annular calcification involving the base of the posterior and anterior leaflets.  2. Aortic outflow tract: There was a mass in the aortic outflow tract approximately 1 cm proximal to the aortic valve on the interventricular septum. It had a mobile, filamentous  structureand it measured 0.58x0.46 cm. The mass was nonobstructive and the continuous-wave Doppler interrogation of the left ventricular outflow tract revealed a max gradient of 5 mm of mercury and a mean gradient of 3 mm of mercury.  3. Aortic valve: The aortic valve leaflets were thickened and there is some decreased mobility of the right coronary cusp there was  trace aortic insufficiency. The left and noncoronary cusps appear to move normally.There were prominent Lambl's excresences noted on the leaflets.  As mentioned the gradient across the aortic outflow tract and aortic valve was 5 mm peak and 3 mm in of mercury mean.  4. Left ventricle: The left ventricular cavity was enlarged and measured 5.3 cm at end diastole in the transgastric short axis view at the suprapapillary level. There was decreased contractility of the inferior septum and inferior wall. However the remainder of the left ventricle segments contracted  normally. Ejection fraction was estimated at 50-55%. There was no thrombus noted in the left ventricular apex.  5. Right ventricle: There was normal right ventricular size and normal contractility the right ventricular free wall.  6. Interatrial septum: There was a patent foramen ovale located in the superior region of the fossa ovalis. This resulted in a continuous left to right shunt which was visible on color Doppler.  7. Left atrium: The left atrial cavity was enlarged and measured 5.9 cm in the medio-lateral dimension. There was no thrombus noted in the left atrium or left atrial appendage.  8. Ascending aorta: The ascending aorta showed a well-defined aortic root and sinotubular ridge with no effacement of the sinuses of Valsalva. There was moderate calcification involving the sinuses of Valsalva and the proximal aortic root.  9. Descending aorta: The descending aorta showed grade 4 atheromatous disease.  The descending aorta measured 2.55 cm in diameter.  Post-bypass findings:  1. Mitral valve: There was an annuloplasty ring in the mitral position. The posterior leaflet was fixed and the anterior leaflet was mobile. There was no residual mitral insufficiency noted. Continuous-wave Doppler interrogation the mitral inflow revealed a mean transmitral gradient of 5 mm of mercury the  mitral valve area of 2.9 cm by pressure half-time.  2.  Aortic valve: The aortic valve was unchanged from the pre-bypass study.   3. Left ventricle: There was some dyssynchrony of ventricular contraction due to  ventricular pacing. The inferior wall and inferior septum appeared akinetic.  There was appeared to be adequate contractility of the lateral wall, the anterior wall, and the anterior septum to the apex. Ejection fraction was estimated at 40-45%.  4. Right ventricle:  The right ventricular was  normal in size and there was some reduced contractility of the right ventricular free wall. No septal flattening was noted. This was consistent with mild right ventricular dysfunction.`  5. Aortic outflow tract. The mass noted in the left ventricular outflow tract could no longer be visualized  6. Interatrial septum: No residual patent foramen ovale could be appreciated.  Kipp Brood M.D.

## 2011-10-13 ENCOUNTER — Inpatient Hospital Stay (HOSPITAL_COMMUNITY): Payer: Medicare Other

## 2011-10-13 LAB — PREPARE FRESH FROZEN PLASMA
Unit division: 0
Unit division: 0

## 2011-10-13 LAB — CBC
HCT: 29.9 % — ABNORMAL LOW (ref 36.0–46.0)
Hemoglobin: 9.5 g/dL — ABNORMAL LOW (ref 12.0–15.0)
MCH: 25.3 pg — ABNORMAL LOW (ref 26.0–34.0)
MCH: 25.3 pg — ABNORMAL LOW (ref 26.0–34.0)
MCHC: 32.4 g/dL (ref 30.0–36.0)
MCV: 78 fL (ref 78.0–100.0)
MCV: 79.5 fL (ref 78.0–100.0)
Platelets: 122 10*3/uL — ABNORMAL LOW (ref 150–400)
Platelets: 116 10*3/uL — ABNORMAL LOW (ref 150–400)
RBC: 3.76 MIL/uL — ABNORMAL LOW (ref 3.87–5.11)
RDW: 16.8 % — ABNORMAL HIGH (ref 11.5–15.5)
WBC: 12.5 10*3/uL — ABNORMAL HIGH (ref 4.0–10.5)
WBC: 13.1 10*3/uL — ABNORMAL HIGH (ref 4.0–10.5)

## 2011-10-13 LAB — POCT I-STAT 4, (NA,K, GLUC, HGB,HCT)
HCT: 30 % — ABNORMAL LOW (ref 36.0–46.0)
Hemoglobin: 10.2 g/dL — ABNORMAL LOW (ref 12.0–15.0)
Potassium: 4.6 mEq/L (ref 3.5–5.1)
Sodium: 140 mEq/L (ref 135–145)

## 2011-10-13 LAB — BASIC METABOLIC PANEL
BUN: 8 mg/dL (ref 6–23)
Calcium: 8.4 mg/dL (ref 8.4–10.5)
Creatinine, Ser: 0.46 mg/dL — ABNORMAL LOW (ref 0.50–1.10)
GFR calc Af Amer: >90 mL/min (ref >90)
GFR calc non Af Amer: >90 mL/min (ref >90)

## 2011-10-13 LAB — GLUCOSE, CAPILLARY
Glucose-Capillary: 105 mg/dL — ABNORMAL HIGH (ref 70–99)
Glucose-Capillary: 109 mg/dL — ABNORMAL HIGH (ref 70–99)
Glucose-Capillary: 125 mg/dL — ABNORMAL HIGH (ref 70–99)
Glucose-Capillary: 138 mg/dL — ABNORMAL HIGH (ref 70–99)
Glucose-Capillary: 67 mg/dL — ABNORMAL LOW (ref 70–99)
Glucose-Capillary: 96 mg/dL (ref 70–99)

## 2011-10-13 LAB — PREPARE PLATELET PHERESIS: Unit division: 0

## 2011-10-13 LAB — MAGNESIUM: Magnesium: 2.5 mg/dL (ref 1.5–2.5)

## 2011-10-13 LAB — CREATININE, SERUM: GFR calc Af Amer: >90 mL/min (ref >90)

## 2011-10-13 MED ORDER — BOOST / RESOURCE BREEZE PO LIQD
1.0000 | Freq: Every day | ORAL | Status: DC
  Administered 2011-10-13: 1 via ORAL

## 2011-10-13 MED ORDER — DEXTROSE 50 % IV SOLN
INTRAVENOUS | Status: AC
  Administered 2011-10-14: 50 mL
  Filled 2011-10-13: qty 50

## 2011-10-13 MED ORDER — POTASSIUM CHLORIDE 10 MEQ/50ML IV SOLN
10.0000 meq | INTRAVENOUS | Status: AC
  Administered 2011-10-13 (×3): 10 meq via INTRAVENOUS

## 2011-10-13 MED ORDER — TRAMADOL HCL 50 MG PO TABS
50.0000 mg | ORAL_TABLET | Freq: Four times a day (QID) | ORAL | Status: DC | PRN
  Administered 2011-10-13 – 2011-10-17 (×6): 50 mg via ORAL
  Filled 2011-10-13 (×6): qty 1

## 2011-10-13 MED ORDER — DEXTROSE 50 % IV SOLN: 25.0000 mL | Freq: Once | INTRAVENOUS | Status: AC | PRN

## 2011-10-13 MED ORDER — MORPHINE SULFATE 2 MG/ML IJ SOLN: 1.0000 mg | INTRAMUSCULAR | Status: DC | PRN

## 2011-10-13 MED ORDER — INSULIN ASPART 100 UNIT/ML ~~LOC~~ SOLN: 0.0000 [IU] | SUBCUTANEOUS | Status: DC

## 2011-10-13 MED ORDER — INFLUENZA VIRUS VACC SPLIT PF IM SUSP: 0.5000 mL | INTRAMUSCULAR | Status: DC | PRN

## 2011-10-13 MED ORDER — FUROSEMIDE 10 MG/ML IJ SOLN
20.0000 mg | Freq: Four times a day (QID) | INTRAMUSCULAR | Status: AC
  Administered 2011-10-13 – 2011-10-14 (×3): 20 mg via INTRAVENOUS

## 2011-10-13 NOTE — Progress Notes (Signed)
Courtesy visit.  Post op CABG, MVR, LVOT mass removal, PFO closure.   Mildly confused. Swan in place.   Appreciate CT surgery team Discussed with Dr. Cornelius Moras.   Consider addition of Statin when able.

## 2011-10-13 NOTE — Clinical Documentation Improvement (Signed)
BMI DOCUMENTATION CLARIFICATION QUERY  THIS DOCUMENT IS NOT A PERMANENT PART OF THE MEDICAL RECORD  TO RESPOND TO THE THIS QUERY, FOLLOW THE INSTRUCTIONS BELOW:  1. If needed, update documentation for the patient's encounter via the notes activity.  2. Access this query again and click edit on the In [employer].  3. After updating, or not, click F2 to complete all highlighted (required) fields concerning your review. Select "additional documentation in the medical record" OR "no additional documentation provided".  4. Click Sign note button.  5. The deficiency will fall out of your In Basket *Please let us know if you are not able to complete this workflow by phone or e-mail (listed below).         10/13/11  Dear Dr. Cornelius Moras Marton Redwood  In an effort to better capture your patient's severity of illness, reflect appropriate length of stay and utilization of resources, a review of the patient medical record has revealed the following indicators.    Based on your clinical judgment, please clarify and document in a progress note and/or discharge summary the clinical condition associated with the following supporting information:  In responding to this query please exercise your independent judgment.  The fact that a query is asked, does not imply that any particular answer is desired or expected.   Possible Clinical conditions:  Underweight w/BMI=17.6  Other condition  Cannot Clinically determine   Risk Factors: Sign & Symptoms: Weight: 99lbs Height: 5'3" BMI= 17.6   Reviewed:  no additional documentation provided  Thank You,  Marciano Sequin,  Clinical Documentation Specialist:  Pager: [phone]  Health Information Management [place]

## 2011-10-13 NOTE — Progress Notes (Signed)
POD # 1 CABG MV repair  BP 133/56  Pulse 90  Temp 98.1 F (36.7 C) (Oral)  Resp 21  Ht 5\' 3"  (1.6 m)  Wt 106 lb 7.7 oz (48.3 kg)  BMI 18.86 kg/m2  SpO2 99%   Intake/Output Summary (Last 24 hours) at 10/13/11 1844 Last data filed at 10/13/11 1800  Gross per 24 hour  Intake   1957 ml  Output   2310 ml  Net   -353 ml   K 4.6, creatinine 0.59 Hct 29  Stable POD # 1  Continue current care

## 2011-10-13 NOTE — Progress Notes (Addendum)
[facility] DAILY PROGRESS NOTE                   [address]            [place] [address]          [phone]      1 Day Post-Op Procedure(s) (LRB): MITRAL VALVE REPAIR (MVR) (N/A) CORONARY ARTERY BYPASS GRAFTING (CABG) (N/A) PATENT FORAMEN OVALE CLOSURE (N/A)  Total Length of Stay:  LOS: 1 day   Subjective: A little confused , but alert/ no distress  Objective: Vital signs in last 24 hours: Temp:  [96.1 F (35.6 C)-98.4 F (36.9 C)] 98.1 F (36.7 C) (10/09 0700) Pulse Rate:  [90-91] 90  (10/09 0700) Cardiac Rhythm:  [-] Atrial paced (10/09 0000) Resp:  [4-42] 16  (10/09 0700) BP: (89-113)/(44-59) 89/49 mmHg (10/09 0700) SpO2:  [91 %-100 %] 96 % (10/09 0700) Arterial Line BP: (85-123)/(43-58) 107/51 mmHg (10/09 0700) FiO2 (%):  [40 %-50.4 %] 40.2 % (10/08 1815) Weight:  [99 lb 3.3 oz (45 kg)-106 lb 7.7 oz (48.3 kg)] 106 lb 7.7 oz (48.3 kg) (10/09 0500)  Filed Weights   10/11/11 1300 10/12/11 1428 10/13/11 0500  Weight: 99 lb (44.906 kg) 99 lb 3.3 oz (45 kg) 106 lb 7.7 oz (48.3 kg)    Weight change: 3.3 oz (0.094 kg)   Hemodynamic parameters for last 24 hours: PAP: (29-40)/(13-24) 40/23 mmHg CO:  [2.9 L/min-3.6 L/min] 3.2 L/min CI:  [2 L/min/m2-2.5 L/min/m2] 2.2 L/min/m2  Intake/Output from previous day: 10/08 0701 - 10/09 0700 In: 7466.4 [I.V.:4566.4; Blood:1850; IV Piggyback:1050] Out: 6395 [Urine:4505; Blood:950; Chest Tube:940]  Intake/Output this shift:    Current Meds: Scheduled Meds:   . acetaminophen (TYLENOL) oral liquid 160 mg/5 mL  650 mg Per Tube NOW   Or  . acetaminophen  650 mg Rectal NOW  . acetaminophen  1,000 mg Oral Q6H   Or  . acetaminophen (TYLENOL) oral liquid 160 mg/5 mL  975 mg Per Tube Q6H  . aminocaproic acid (AMICAR) for OHS   Intravenous To OR  . aspirin EC  325 mg Oral Daily   Or  . aspirin  324 mg Per Tube Daily  . bisacodyl  10 mg Oral Daily   Or  . bisacodyl  10 mg Rectal Daily  . dexmedetomidine   0.1-0.7 mcg/kg/hr Intravenous To OR  . docusate sodium  200 mg Oral Daily  . heparin-papaverine-plasmalyte irrigation   Irrigation To OR  . insulin aspart  0-24 Units Subcutaneous Q2H   Followed by  . insulin aspart  0-24 Units Subcutaneous Q4H  . insulin (NOVOLIN-R) infusion   Intravenous To OR  . magnesium sulfate  4 g Intravenous Once  . metoprolol tartrate  12.5 mg Oral BID   Or  . metoprolol tartrate  12.5 mg Per Tube BID  . moxifloxacin  400 mg Intravenous To OR  . moxifloxacin  400 mg Intravenous Q24H  . pantoprazole  40 mg Oral Q1200  . phenylephrine (NEO-SYNEPHRINE) Adult infusion  30-200 mcg/min Intravenous To OR  . potassium chloride  10 mEq Intravenous Q1 Hr x 3  . potassium chloride  10 mEq Intravenous Q1 Hr x 3  . sodium chloride  3 mL Intravenous Q12H  . vancomycin  1,000 mg Intravenous To OR  . vancomycin  1,000 mg Intravenous Once  . DISCONTD: chlorhexidine  30 mL Topical UD  . DISCONTD: DOPamine  2-20 mcg/kg/min Intravenous To OR  . DISCONTD: epinephrine  0.5-20 mcg/min Intravenous To OR  . DISCONTD: famotidine (PEPCID) IV  20 mg Intravenous Q12H  . DISCONTD: insulin regular  0-10 Units Intravenous TID WC  . DISCONTD: magnesium sulfate  40 mEq Other To OR  . DISCONTD: metoprolol tartrate  12.5 mg Oral Once  . DISCONTD: potassium chloride  80 mEq Other To OR   Continuous Infusions:   . sodium chloride 20 mL/hr (10/12/11 1526)  . sodium chloride 20 mL/hr (10/12/11 1527)  . sodium chloride    . dexmedetomidine Stopped (10/13/11 0000)  . DOPamine Stopped (10/13/11 0100)  . lactated ringers 20 mL/hr (10/12/11 1527)  . nitroGLYCERIN Stopped (10/12/11 1500)  . phenylephrine (NEO-SYNEPHRINE) Adult infusion 20 mcg/min (10/13/11 0600)  . DISCONTD: insulin (NOVOLIN-R) infusion 2.2 Units/hr (10/12/11 1600)   PRN Meds:.albumin human, calcium chloride, metoprolol, midazolam, morphine injection, morphine injection, ondansetron (ZOFRAN) IV, oxyCODONE, potassium chloride,  sodium chloride, DISCONTD: electrolyte-A, DISCONTD: hemostatic agents, DISCONTD: sodium chloride irrigation, DISCONTD: sodium chloride irrigation, DISCONTD: Surgifoam 1 Gm with 0.9% sodium chloride (4 ml) topical solution  General appearance: alert, cooperative, distracted, fatigued and no distress Heart: regular rate and rhythm and friction rub heard with chest tubes in place, no def murmur appreciated Lungs: mildly dim in bases Abdomen: soft, min distension, scant BS, non-tender Extremities: no edema Wound: dressings CDI  Lab Results: CBC: Basename 10/13/11 0410 10/12/11 2128 10/12/11 2115  WBC 12.5* -- 9.5  HGB 9.4* 10.5* --  HCT 29.0* 31.0* --  PLT 122* -- 131*   BMET:  Basename 10/13/11 0410 10/12/11 2128  NA 142 147*  K 3.6 3.9  CL 111 110  CO2 25 --  GLUCOSE 101* 94  BUN 8 5*  CREATININE 0.46* 0.60  CALCIUM 8.4 --    PT/INR:  Basename 10/12/11 1445  LABPROT 16.6*  INR 1.38   Radiology: Dg Chest Portable 1 View  10/12/2011  *RADIOLOGY REPORT*  Clinical Data: Postop day zero.  Procedure: MITRAL VALVE REPAIR (complex valvuloplasty with 26 mm Sorin Memo 3D ring annuloplasty); CORONARY ARTERY BYPASS GRAFTING X3 (LIMA to LAD, SVG to OM, SVG to RCA, EVH via left thigh); RESECTION OF LV OUTFLOW TRACT MASS; CLOSURE OF PATENT FORAMEN OVALE  PORTABLE CHEST - 1 VIEW  Comparison: 10/08/2011  Findings: Endotracheal tube tip 2.6 cm above carina.  Right internal jugular Swan-Ganz catheter tip in the main pulmonary artery.  Nasogastric tube tip in the stomach.  Bilateral chest tubes and mediastinal drain are in place.  Epicardial pacer leads noted.  The patient is rotated to the right on today's exam, resulting in reduced diagnostic sensitivity and specificity.   No pneumothorax observed.  Cardiomegaly noted with diffuse interstitial opacity with Kerley B lines compatible with interstitial edema.  Correlate to Swan-Ganz pressure measurements.  Mitral valve prosthesis observed.  Hiatal hernia  noted.  Suspected underlying emphysema.  IMPRESSION:  1.  Tubes and lines appears satisfactorily positioned.  No pneumothorax. 2.  Cardiomegaly with interstitial edema.   Original Report Authenticated By: Dellia Cloud, M.D.      Assessment/Plan: S/P Procedure(s) (LRB): MITRAL VALVE REPAIR (MVR) (N/A) CORONARY ARTERY BYPASS GRAFTING (CABG) (N/A) PATENT FORAMEN OVALE CLOSURE (N/A)  1. Doing very well 2 minimal drainage after just dangling , can probably D/C tubes soon 3 expected ABL anemia- stable, monitor 4 will need some diuresis, good post op urine output , renal fxn good 5 good hemodynamics on small amt of neo- wean as able 6 apaced, continue, rhythm underneath? Junctional/block 7 thrombocytopenia stable- monitor 8 limit narcs-  change to ultram 9 rehab/ pulm toilet- push as able  GOLD,WAYNE E 10/13/2011 7:49 AM     I have seen and examined the patient and agree with the assessment and plan as outlined.  Overall doing well POD1.  Mildly confused - not unexpected given frail preop baseline.  Mobilize.  Start diuretics once BP stable off Neo drip.  D/C mediastinal tubes.  Lurena Naeve H 10/13/2011 8:49 AM

## 2011-10-13 NOTE — Addendum Note (Signed)
Addendum  created 10/13/11 1459 by Shireen Quan, CRNA   Modules edited:Anesthesia Events

## 2011-10-13 NOTE — Addendum Note (Signed)
Addendum  created 10/13/11 1610 by Kipp Brood, MD   Modules edited:Notes Section

## 2011-10-13 NOTE — Care Management Note (Signed)
    Page 1 of 2   10/19/2011     2:19:30 PM   CARE MANAGEMENT NOTE 10/19/2011  Patient:  Victoria Sexton, Victoria Sexton   Account Number:  [id number]  Date Initiated:  10/13/2011  Documentation initiated by:  [facility]  Subjective/Objective Assessment:   Post op MVR, CABG.  Has spouse.     Action/Plan:   Anticipated DC Date:  10/19/2011   Anticipated DC Plan:  HOME W HOME HEALTH SERVICES  In-house referral  Clinical Social Worker      DC Planning Services  CM consult      [facility] Choice  HOME HEALTH   Choice offered to / List presented to:  C-1 Patient        HH arranged  HH-1 RN      [facility] agency  [facility]   Status of service:  Completed, signed off Medicare Important Message given?   (If response is "NO", the following Medicare IM given date fields will be blank) Date Medicare IM given:   Date Additional Medicare IM given:    Discharge Disposition:  HOME W HOME HEALTH SERVICES  Per UR Regulation:  Reviewed for med. necessity/level of care/duration of stay  If discussed at Long Length of Stay Meetings, dates discussed:    Comments:  ContactShavell, Victoria Sexton [phone]   [phone]  10/19/11 Victoria Sanson,RN,BSN 1130 PT FOR DC HOME TODAY WITH HUSBAND.  PT AGREEABLE TO HHRN FOR FOLLOW UP, BUT FEELS SHE DOES NOT NEED ANY HOME PHYSICAL THERAPY.  PT AND HUSBAND CHOOSE [facility] FOR [facility] SERVICES.  REFERRAL TO [facility] FOR HHRN.  START OF CARE 24-48H POST DC DATE.   PT HAS WALKER, BSC AND SHOWER CHAIR AT HOME.  NO DME NEEDS.  10/15/11 Victoria Williard,RN,BSN [phone] PT PROGRESSING WELL; PLANNING HOME DISCHARGE.  WOULD RECOMMEND HOME HEALTH FOLLOW UP (RN, PT, AND OT) AT DISCHARGE.  WILL FOLLOW.  10-13-11 2pm Victoria Sexton, RNBSN [phone] Talked with patient and husband at bedside.  Patient extubated, OOB in chair, slightly confused. Does not remember why she is here. Prior to this admission both were independent.  Have no children, have a large church family. Have a walker and w/c  at home if needed.

## 2011-10-13 NOTE — Addendum Note (Signed)
Addendum  created 10/13/11 1150 by Adair Laundry, CRNA   Modules edited:Anesthesia Events, Anesthesia Flowsheet

## 2011-10-13 NOTE — Progress Notes (Signed)
INITIAL ADULT NUTRITION ASSESSMENT Date: 10/13/2011   Time: 11:30 AM  INTERVENTION:  Resource Breeze supplement daily (250 kcals, 9 gm protein per 8 fl oz carton) RD to follow for nutrition care plan  Reason for Assessment: Low Braden  ASSESSMENT: Female 78 y.o.  Dx: S/P mitral valve repair  Hx:  Past Medical History  Diagnosis Date  . Hypertension   . MVP (mitral valve prolapse)   . Osteoporosis   . Inguinal hernia right  . Mitral regurgitation due to cusp prolapse   . Cardiac tumor, ventricular     LVOT  . PVC (premature ventricular contraction)   . Hiatal hernia   . Coronary artery disease 10/04/2011  . Congestive heart failure 10/04/2011  . Heart murmur   . Shortness of breath   . Anginal pain   . Hernia, hiatal     Left wears a hernia belt  . Skin cancer 2013    Face.  . S/P mitral valve repair 10/12/2011    Complex valvuloplasty including triangular resection of flail posterior leaflet with artificial Goretex neocord placement x2 and 26 mm Sorin Memo 3D ring annuloplasty  . S/P CABG x 3 10/12/2011    LIMA to LAD, SVG to OM, SVG to RCA, EVH via left thigh and leg    Related Meds:     . acetaminophen (TYLENOL) oral liquid 160 mg/5 mL  650 mg Per Tube NOW   Or  . acetaminophen  650 mg Rectal NOW  . acetaminophen  1,000 mg Oral Q6H  . aminocaproic acid (AMICAR) for OHS   Intravenous To OR  . aspirin EC  325 mg Oral Daily  . bisacodyl  10 mg Oral Daily   Or  . bisacodyl  10 mg Rectal Daily  . docusate sodium  200 mg Oral Daily  . insulin aspart  0-24 Units Subcutaneous Q2H   Followed by  . insulin aspart  0-24 Units Subcutaneous Q4H  . magnesium sulfate  4 g Intravenous Once  . moxifloxacin  400 mg Intravenous Q24H  . pantoprazole  40 mg Oral Q1200  . potassium chloride  10 mEq Intravenous Q1 Hr x 3  . potassium chloride  10 mEq Intravenous Q1 Hr x 3  . sodium chloride  3 mL Intravenous Q12H  . vancomycin  1,000 mg Intravenous Once  . DISCONTD:  acetaminophen (TYLENOL) oral liquid 160 mg/5 mL  975 mg Per Tube Q6H  . DISCONTD: aspirin  324 mg Per Tube Daily  . DISCONTD: chlorhexidine  30 mL Topical UD  . DISCONTD: DOPamine  2-20 mcg/kg/min Intravenous To OR  . DISCONTD: epinephrine  0.5-20 mcg/min Intravenous To OR  . DISCONTD: famotidine (PEPCID) IV  20 mg Intravenous Q12H  . DISCONTD: insulin aspart  0-24 Units Subcutaneous Q4H  . DISCONTD: insulin regular  0-10 Units Intravenous TID WC  . DISCONTD: magnesium sulfate  40 mEq Other To OR  . DISCONTD: metoprolol tartrate  12.5 mg Per Tube BID  . DISCONTD: metoprolol tartrate  12.5 mg Oral Once  . DISCONTD: metoprolol tartrate  12.5 mg Oral BID  . DISCONTD: potassium chloride  80 mEq Other To OR    Ht: 5\' 3"  (160 cm)  Wt: 106 lb 7.7 oz (48.3 kg)  Ideal Wt: 52.2 kg % Ideal Wt: 93%  Usual Wt: 100 lb -- per office visit record 10/04/11 % Usual Wt: 106%  Body mass index is 18.86 kg/(m^2).  Food/Nutrition Related Hx: no triggers per admission nutrition screen  Labs:  CMP     Component Value Date/Time   NA 142 10/13/2011 0410   K 3.6 10/13/2011 0410   CL 111 10/13/2011 0410   CO2 25 10/13/2011 0410   GLUCOSE 101* 10/13/2011 0410   BUN 8 10/13/2011 0410   CREATININE 0.46* 10/13/2011 0410   CALCIUM 8.4 10/13/2011 0410   PROT 6.8 10/08/2011 1229   ALBUMIN 3.8 10/08/2011 1229   AST 26 10/08/2011 1229   ALT 14 10/08/2011 1229   ALKPHOS 72 10/08/2011 1229   BILITOT 0.2* 10/08/2011 1229   GFRNONAA >90 10/13/2011 0410   GFRAA >90 10/13/2011 0410     Intake/Output Summary (Last 24 hours) at 10/13/11 1131 Last data filed at 10/13/11 1100  Gross per 24 hour  Intake 7700.1 ml  Output   6130 ml  Net 1570.1 ml    CBG (last 3)   Basename 10/13/11 0804 10/13/11 0414 10/13/11 0226  GLUCAP 95 96 125*    Diet Order: Clear Liquid  Supplements/Tube Feeding: N/A  IVF:    sodium chloride Last Rate: 20 mL/hr (10/12/11 1526)  sodium chloride Last Rate: 20 mL/hr (10/12/11 1527)    sodium chloride   phenylephrine (NEO-SYNEPHRINE) Adult infusion Last Rate: Stopped (10/13/11 1000)  DISCONTD: dexmedetomidine Last Rate: Stopped (10/13/11 0000)  DISCONTD: DOPamine Last Rate: Stopped (10/13/11 0100)  DISCONTD: insulin (NOVOLIN-R) infusion Last Rate: 2.2 Units/hr (10/12/11 1600)  DISCONTD: lactated ringers Last Rate: 20 mL/hr (10/12/11 1527)  DISCONTD: nitroGLYCERIN Last Rate: Stopped (10/12/11 1500)    Estimated Nutritional Needs:   Kcal: 1400-1600 Protein: 65-75 gm Fluid: > 1.5 L  Patient s/p CABG, mitral valve repair, resection of cardiac tumor and closure of patent foramen ovale 10/8; extubated post-op; patient mildly confused upon RD visitation; states she was eating OK at home prior to admission; reports weight loss, however, unable to quantify amount or time frame; amenable to RD ordering supplement -- she did state "don't give me a lot"; will order daily.  NUTRITION DIAGNOSIS: -Increased nutrient needs (NI-5.1).  Status: Ongoing  RELATED TO: post-op healing  AS EVIDENCE BY: estimated nutrition needs  MONITORING/EVALUATION(Goals): Goal: Oral intake with meals & supplements to meet >/= 90% of estimated nutrition needs Monitor: PO & supplemental intake, weight, labs, I/O's  EDUCATION NEEDS: -No education needs identified at this time  Dietitian #: [phone]  DOCUMENTATION CODES Per approved criteria  -Not Applicable    Alger Memos 10/13/2011, 11:30 AM

## 2011-10-13 NOTE — Addendum Note (Signed)
Addendum  created 10/13/11 1459 by Naviyah Schaffert R Olaf Mesa, CRNA   Modules edited:Anesthesia Events    

## 2011-10-13 NOTE — Progress Notes (Signed)
Anesthesiology Follow up:  Alert, neuro intact,   hemodynamically stable, a paced at 90  neosynephrine at 30 mcg/min, dopamine weaned off.  BP 113/58, HR-90, RR 18, T-36.7 PA 33/19  CO/CI 3.5/2.5  K-3.6 glucose 101 BUN/Cr 8/0.46 H/H 9.4/29 Plts-122,000  Extubated 4 hours post-op   POD #1, making good progress no apparent complications.  Kipp Brood, MD

## 2011-10-14 ENCOUNTER — Inpatient Hospital Stay (HOSPITAL_COMMUNITY): Payer: Medicare Other

## 2011-10-14 LAB — GLUCOSE, CAPILLARY
Glucose-Capillary: 121 mg/dL — ABNORMAL HIGH (ref 70–99)
Glucose-Capillary: 131 mg/dL — ABNORMAL HIGH (ref 70–99)
Glucose-Capillary: 139 mg/dL — ABNORMAL HIGH (ref 70–99)

## 2011-10-14 LAB — TYPE AND SCREEN
Unit division: 0
Unit division: 0

## 2011-10-14 LAB — BASIC METABOLIC PANEL
Calcium: 8.7 mg/dL (ref 8.4–10.5)
GFR calc Af Amer: >90 mL/min (ref >90)
GFR calc non Af Amer: 89 mL/min — ABNORMAL LOW (ref >90)
Potassium: 3.5 mEq/L (ref 3.5–5.1)
Sodium: 138 mEq/L (ref 135–145)

## 2011-10-14 LAB — CBC
Hemoglobin: 9.8 g/dL — ABNORMAL LOW (ref 12.0–15.0)
MCH: 24.9 pg — ABNORMAL LOW (ref 26.0–34.0)
Platelets: 112 10*3/uL — ABNORMAL LOW (ref 150–400)
RBC: 3.94 MIL/uL (ref 3.87–5.11)
WBC: 13.7 10*3/uL — ABNORMAL HIGH (ref 4.0–10.5)

## 2011-10-14 MED ORDER — WARFARIN SODIUM 2 MG PO TABS
2.0000 mg | ORAL_TABLET | Freq: Every day | ORAL | Status: DC
  Administered 2011-10-14 – 2011-10-15 (×2): 2 mg via ORAL
  Filled 2011-10-14 (×3): qty 1

## 2011-10-14 MED ORDER — MOVING RIGHT ALONG BOOK
Freq: Once | Status: AC
  Administered 2011-10-14: 10:00:00
  Filled 2011-10-14: qty 1

## 2011-10-14 MED ORDER — FUROSEMIDE 10 MG/ML IJ SOLN
20.0000 mg | Freq: Four times a day (QID) | INTRAMUSCULAR | Status: AC
  Administered 2011-10-14: 20 mg via INTRAVENOUS
  Filled 2011-10-14: qty 2

## 2011-10-14 MED ORDER — ASPIRIN EC 81 MG PO TBEC
81.0000 mg | DELAYED_RELEASE_TABLET | Freq: Every day | ORAL | Status: DC
  Administered 2011-10-14 – 2011-10-19 (×6): 81 mg via ORAL
  Filled 2011-10-14 (×6): qty 1

## 2011-10-14 MED ORDER — POTASSIUM CHLORIDE 10 MEQ/50ML IV SOLN
10.0000 meq | INTRAVENOUS | Status: AC
  Administered 2011-10-14 (×3): 10 meq via INTRAVENOUS
  Filled 2011-10-14: qty 150

## 2011-10-14 MED ORDER — METOPROLOL TARTRATE 12.5 MG HALF TABLET
12.5000 mg | ORAL_TABLET | Freq: Two times a day (BID) | ORAL | Status: DC
  Administered 2011-10-14 (×2): 12.5 mg via ORAL
  Filled 2011-10-14 (×4): qty 1

## 2011-10-14 MED ORDER — ONDANSETRON HCL 4 MG/2ML IJ SOLN
4.0000 mg | Freq: Four times a day (QID) | INTRAMUSCULAR | Status: DC | PRN
  Administered 2011-10-14: 4 mg via INTRAVENOUS

## 2011-10-14 MED ORDER — ASPIRIN EC 81 MG PO TBEC: 81.0000 mg | DELAYED_RELEASE_TABLET | Freq: Every day | ORAL | Status: DC

## 2011-10-14 MED ORDER — ENSURE COMPLETE PO LIQD
237.0000 mL | Freq: Three times a day (TID) | ORAL | Status: DC
  Administered 2011-10-14 (×2): 237 mL via ORAL

## 2011-10-14 MED ORDER — POTASSIUM CHLORIDE CRYS ER 20 MEQ PO TBCR
20.0000 meq | EXTENDED_RELEASE_TABLET | Freq: Every day | ORAL | Status: DC
Start: 2011-10-14 — End: 2011-10-14

## 2011-10-14 MED ORDER — SODIUM CHLORIDE 0.9 % IJ SOLN
3.0000 mL | Freq: Two times a day (BID) | INTRAMUSCULAR | Status: DC
  Administered 2011-10-14 – 2011-10-19 (×10): 3 mL via INTRAVENOUS

## 2011-10-14 MED ORDER — POTASSIUM CHLORIDE CRYS ER 20 MEQ PO TBCR
20.0000 meq | EXTENDED_RELEASE_TABLET | Freq: Two times a day (BID) | ORAL | Status: DC
  Administered 2011-10-15 – 2011-10-17 (×6): 20 meq via ORAL
  Filled 2011-10-14 (×8): qty 1

## 2011-10-14 MED ORDER — WARFARIN - PHYSICIAN DOSING INPATIENT
Freq: Every day | Status: DC
  Administered 2011-10-14 – 2011-10-18 (×4)

## 2011-10-14 MED ORDER — FUROSEMIDE 40 MG PO TABS
40.0000 mg | ORAL_TABLET | Freq: Two times a day (BID) | ORAL | Status: DC
  Administered 2011-10-15 – 2011-10-18 (×7): 40 mg via ORAL
  Filled 2011-10-14 (×9): qty 1

## 2011-10-14 MED ORDER — FUROSEMIDE 10 MG/ML IJ SOLN: 20.0000 mg | Freq: Four times a day (QID) | INTRAMUSCULAR | Status: DC

## 2011-10-14 MED ORDER — POTASSIUM CHLORIDE 10 MEQ/50ML IV SOLN
10.0000 meq | Freq: Once | INTRAVENOUS | Status: DC
  Administered 2011-10-14: 10 meq via INTRAVENOUS

## 2011-10-14 MED ORDER — SODIUM CHLORIDE 0.9 % IJ SOLN: 3.0000 mL | INTRAMUSCULAR | Status: DC | PRN

## 2011-10-14 MED ORDER — ONDANSETRON HCL 4 MG/2ML IJ SOLN
INTRAMUSCULAR | Status: AC
  Administered 2011-10-14: 4 mg via INTRAVENOUS
  Filled 2011-10-14: qty 2

## 2011-10-14 MED ORDER — FUROSEMIDE 40 MG PO TABS: 40.0000 mg | ORAL_TABLET | Freq: Every day | ORAL | Status: DC

## 2011-10-14 MED ORDER — SODIUM CHLORIDE 0.9 % IV SOLN: 250.0000 mL | INTRAVENOUS | Status: DC | PRN

## 2011-10-14 MED FILL — Magnesium Sulfate Inj 50%: INTRAMUSCULAR | Qty: 10 | Status: AC

## 2011-10-14 MED FILL — Sodium Bicarbonate IV Soln 8.4%: INTRAVENOUS | Qty: 50 | Status: AC

## 2011-10-14 MED FILL — Sodium Chloride IV Soln 0.9%: INTRAVENOUS | Qty: 1000 | Status: AC

## 2011-10-14 MED FILL — Electrolyte-R (PH 7.4) Solution: INTRAVENOUS | Qty: 3000 | Status: AC

## 2011-10-14 MED FILL — Potassium Chloride Inj 2 mEq/ML: INTRAVENOUS | Qty: 40 | Status: AC

## 2011-10-14 MED FILL — Sodium Chloride Irrigation Soln 0.9%: Qty: 3000 | Status: AC

## 2011-10-14 MED FILL — Mannitol IV Soln 20%: INTRAVENOUS | Qty: 500 | Status: AC

## 2011-10-14 MED FILL — Lidocaine HCl IV Inj 20 MG/ML: INTRAVENOUS | Qty: 5 | Status: AC

## 2011-10-14 MED FILL — Heparin Sodium (Porcine) Inj 1000 Unit/ML: INTRAMUSCULAR | Qty: 30 | Status: AC

## 2011-10-14 MED FILL — Heparin Sodium (Porcine) Inj 1000 Unit/ML: INTRAMUSCULAR | Qty: 10 | Status: AC

## 2011-10-14 NOTE — Progress Notes (Signed)
Courtesy visit  More lucid this am.  CABG, MVR, PFO, LV mass.  -Will follow.

## 2011-10-14 NOTE — Evaluation (Signed)
Physical Therapy Evaluation Patient Details Name: Victoria Sexton MRN: [id number] DOB: Nov 01, 1927 Today's Date: 10/14/2011 Time: 1320-1400 PT Time Calculation (min): 40 min  PT Assessment / Plan / Recommendation Clinical Impression  Pt s/p MVR and CABG slowly progressing with mobility with AMS. Pt unable to recall precautions after repeated education and practice and educated spouse in room as well with handout provided. If pt able to meet goals should be able to discharge home with spouse, if delayed recovery particularly of AMS may need  further therapy in a rehab venue prior to home. Will follow to maximize mobility, safety, transfers and adherence to precautions prior to discharge.     PT Assessment  Patient needs continued PT services    Follow Up Recommendations  Home health PT;Supervision/Assistance - 24 hour    Does the patient have the potential to tolerate intense rehabilitation      Barriers to Discharge None      Equipment Recommendations  3 in 1 bedside comode    Recommendations for Other Services     Frequency Min 3X/week    Precautions / Restrictions Precautions Precautions: Sternal Precaution Comments: bil Chest tubesw   Pertinent Vitals/Pain No pain HR 88 throughout O2 94% on RA at initiation of session with drop to 90% after ambulation      Mobility  Bed Mobility Bed Mobility: Rolling Right;Right Sidelying to Sit Rolling Right: 4: Min guard Right Sidelying to Sit: 4: Min assist;HOB flat Details for Bed Mobility Assistance: cueing for sequence and precautions Transfers Transfers: Sit to Stand;Stand to Sit Sit to Stand: 4: Min guard;From bed;From chair/3-in-1;4: Min assist Stand to Sit: 4: Min guard;To chair/3-in-1 Details for Transfer Assistance: cueing for hand placement and sequence, minguard from bed and BSC over toilet, min assist from low chair with max cueing, total 3 trials Ambulation/Gait Ambulation/Gait Assistance: 4: Min guard Ambulation  Distance (Feet): 24 Feet (18', 6', 24' seated rest between each trial) Assistive device: Rolling walker Ambulation/Gait Assistance Details: cueing for position in RW, posture and assist to direct RW due to heaviness with bil CT on RW. Pt denied further ambulation due to feeling of nausea and presyncope Gait Pattern: Step-through pattern;Decreased stride length Gait velocity: decreased Stairs: No    Shoulder Instructions     Exercises     PT Diagnosis: Difficulty walking;Altered mental status  PT Problem List: Decreased mobility;Decreased activity tolerance;Decreased safety awareness;Decreased knowledge of precautions;Decreased knowledge of use of DME PT Treatment Interventions: Gait training;DME instruction;Functional mobility training;Therapeutic activities;Patient/family education   PT Goals Acute Rehab PT Goals PT Goal Formulation: With patient/family Time For Goal Achievement: 10/28/11 Potential to Achieve Goals: Good Pt will go Supine/Side to Sit: with modified independence PT Goal: Supine/Side to Sit - Progress: Goal set today Pt will go Sit to Supine/Side: with modified independence PT Goal: Sit to Supine/Side - Progress: Goal set today Pt will go Sit to Stand: with supervision PT Goal: Sit to Stand - Progress: Goal set today Pt will go Stand to Sit: with supervision PT Goal: Stand to Sit - Progress: Goal set today Pt will Ambulate: >150 feet;with supervision;with least restrictive assistive device PT Goal: Ambulate - Progress: Goal set today Pt will Go Up / Down Stairs: Flight;with rail(s);with supervision PT Goal: Up/Down Stairs - Progress: Goal set today Additional Goals Additional Goal #1: Pt will independently state and adhere to all sternal precautions PT Goal: Additional Goal #1 - Progress: Goal set today  Visit Information  Last PT Received On: 10/14/11 Assistance Needed: +1  Subjective Data  Subjective: I don't want to do anything now that I'm  retired Patient Stated Goal: none stated but agreeable to mobilzing to meet needs for return home with spouse   Prior Functioning  Home Living Lives With: Spouse Available Help at Discharge: Family;Available 24 hours/day Type of Home: House Home Access: Stairs to enter [employer] of Steps: 5 Entrance Stairs-Rails: Right;Left Home Layout: Bed/bath upstairs;Two level;1/2 bath on main level Alternate Level Stairs-Number of Steps: 14 Alternate Level Stairs-Rails: Left;Right (alternates sides ) Bathroom Shower/Tub: Walk-in shower;Door [employer] Toilet: Standard Home Adaptive Equipment: Other (comment) (husband states they have a seat in the shower but not DME) Prior Function Level of Independence: Independent Able to Take Stairs?: Yes Driving: Yes Vocation: Retired Musician: No difficulties    Cognition  Overall Cognitive Status: Impaired Area of Impairment: Memory Arousal/Alertness: Awake/alert Orientation Level: Appears intact for tasks assessed Behavior During Session: [facility] for tasks performed Memory: Decreased recall of precautions    Extremity/Trunk Assessment Right Upper Extremity Assessment RUE ROM/Strength/Tone: [facility] for tasks assessed Left Upper Extremity Assessment LUE ROM/Strength/Tone: WFL for tasks assessed Right Lower Extremity Assessment RLE ROM/Strength/Tone: WFL for tasks assessed Left Lower Extremity Assessment LLE ROM/Strength/Tone: WFL for tasks assessed Trunk Assessment Trunk Assessment: Normal   Balance    End of Session PT - End of Session Equipment Utilized During Treatment: Gait belt Activity Tolerance: Patient limited by fatigue Patient left: in chair;with call bell/phone within reach;with family/visitor present;with nursing in room Nurse Communication: Mobility status  GP     Toney Sang Beth 10/14/2011, 2:07 PM  Delaney Meigs, PT [phone]

## 2011-10-14 NOTE — Progress Notes (Signed)
[facility] DAILY PROGRESS NOTE                   [address]            [place] [address]          [phone]      2 Days Post-Op Procedure(s) (LRB): MITRAL VALVE REPAIR (MVR) (N/A) CORONARY ARTERY BYPASS GRAFTING (CABG) (N/A) PATENT FORAMEN OVALE CLOSURE (N/A)  Total Length of Stay:  LOS: 2 days   Subjective: Feeling better/stronger  Objective: Vital signs in last 24 hours: Temp:  [97.5 F (36.4 C)-98.6 F (37 C)] 98.5 F (36.9 C) (10/10 0801) Pulse Rate:  [89-107] 104  (10/10 0800) Cardiac Rhythm:  [-] Normal sinus rhythm (10/10 0800) Resp:  [16-31] 23  (10/10 0800) BP: (92-159)/(49-80) 157/69 mmHg (10/10 0800) SpO2:  [91 %-99 %] 98 % (10/10 0800) Arterial Line BP: (108-133)/(44-57) 111/47 mmHg (10/09 1100) Weight:  [104 lb 11.5 oz (47.5 kg)] 104 lb 11.5 oz (47.5 kg) (10/10 0500)  Filed Weights   10/12/11 1428 10/13/11 0500 10/14/11 0500  Weight: 99 lb 3.3 oz (45 kg) 106 lb 7.7 oz (48.3 kg) 104 lb 11.5 oz (47.5 kg)    Weight change: 5 lb 8.2 oz (2.5 kg)   Hemodynamic parameters for last 24 hours: PAP: (32)/(15) 32/15 mmHg  Intake/Output from previous day: 10/09 0701 - 10/10 0700 In: 1252.7 [P.O.:600; I.V.:498.7; IV Piggyback:154] Out: 2695 [Urine:1995; Chest Tube:700]  Intake/Output this shift: Total I/O In: 72 [I.V.:20; IV Piggyback:52] Out: 175 [Urine:175]  Current Meds: Scheduled Meds:   . acetaminophen  1,000 mg Oral Q6H  . aspirin EC  325 mg Oral Daily  . bisacodyl  10 mg Oral Daily   Or  . bisacodyl  10 mg Rectal Daily  . dextrose      . docusate sodium  200 mg Oral Daily  . feeding supplement  1 Container Oral Q1500  . furosemide  20 mg Intravenous Q6H  . insulin aspart  0-24 Units Subcutaneous Q4H  . pantoprazole  40 mg Oral Q1200  . potassium chloride  10 mEq Intravenous Q1 Hr x 3  . sodium chloride  3 mL Intravenous Q12H  . DISCONTD: acetaminophen (TYLENOL) oral liquid 160 mg/5 mL  975 mg Per Tube Q6H  . DISCONTD: aspirin   324 mg Per Tube Daily  . DISCONTD: insulin aspart  0-24 Units Subcutaneous Q4H  . DISCONTD: metoprolol tartrate  12.5 mg Per Tube BID  . DISCONTD: metoprolol tartrate  12.5 mg Oral BID   Continuous Infusions:   . sodium chloride 20 mL/hr (10/12/11 1526)  . sodium chloride 20 mL/hr (10/12/11 1527)  . sodium chloride    . phenylephrine (NEO-SYNEPHRINE) Adult infusion Stopped (10/13/11 1000)  . DISCONTD: dexmedetomidine Stopped (10/13/11 0000)  . DISCONTD: DOPamine Stopped (10/13/11 0100)  . DISCONTD: lactated ringers 20 mL/hr (10/12/11 1527)  . DISCONTD: nitroGLYCERIN Stopped (10/12/11 1500)   PRN Meds:.albumin human, dextrose, influenza  inactive virus vaccine, metoprolol, midazolam, morphine injection, ondansetron (ZOFRAN) IV, potassium chloride, sodium chloride, traMADol, DISCONTD:  morphine injection  General appearance: alert, cooperative and no distress Heart: regular rate and rhythm and + rub Lungs: pleural rub on left with ct in place Abdomen: benign exam Extremities: no edema Wound: incisions healing well  Lab Results: CBC: Basename 10/14/11 0455 10/13/11 1739  WBC 13.7* 13.1*  HGB 9.8* 9.5*10.2*  HCT 31.1* 29.9*30.0*  PLT 112* 116*   BMET:  Basename 10/14/11 0455 10/13/11 1739 10/13/11 0410  NA 138 140 --  K 3.5 4.6 --  CL 103 -- 111  CO2 24 -- 25  GLUCOSE 109* 104* --  BUN 14 -- 8  CREATININE 0.48* 0.59 --  CALCIUM 8.7 -- 8.4    PT/INR:  Basename 10/12/11 1445  LABPROT 16.6*  INR 1.38   Radiology: Dg Chest Portable 1 View In Am  10/14/2011  *RADIOLOGY REPORT*  Clinical Data: Status post mitral valve repair and CABG.  PORTABLE CHEST - 1 VIEW  Comparison: Chest x-ray 10/13/2011.  Findings: Previously noted Swan-Ganz catheter has been removed. Right IJ Cordis remains in position with tip in the proximal superior vena cava.  Bilateral chest tubes are similarly positioned and appear properly located.  No appreciable pneumothorax is noted at this time.   Minimal bibasilar atelectasis.  No significant pleural effusions.  Moderate enlargement of the cardiopericardial silhouette, similar to prior examinations.  Large hiatal hernia. The patient is rotated to the right on today's exam, resulting in distortion of the mediastinal contours and reduced diagnostic sensitivity and specificity for mediastinal pathology. Atherosclerosis in the thoracic aorta.  Status post median sternotomy for CABG and mitral annuloplasty.  Epicardial pacing wires remain in position.  IMPRESSION: 1.  Support apparatus, as above. 2.  Allowing for slight differences in patient positioning, the radiographic appearance of chest is otherwise essentially unchanged, as above.   Original Report Authenticated By: Florencia Reasons, M.D.    Dg Chest Portable 1 View In Am  10/13/2011  *RADIOLOGY REPORT*  Clinical Data: Postop  PORTABLE CHEST - 1 VIEW  Comparison: 10/12/2011  Findings: Interval extubation and removal of enteric tube.  Stable right IJ Swan-Ganz catheter with its tip in the main pulmonary artery.  Stable bilateral chest tubes and mediastinal drain.  No pneumothorax is seen.  Pulmonary vascular congestion without frank interstitial edema, improved.  No definite pleural effusion.  Cardiomegaly.  Postsurgical changes related to prior CABG.  Valve annuloplasty.  IMPRESSION: Interval extubation.  Bilateral chest tubes and mediastinal drain.  No pneumothorax is seen.   Original Report Authenticated By: Charline Bills, M.D.    Dg Chest Portable 1 View  10/12/2011  *RADIOLOGY REPORT*  Clinical Data: Postop day zero.  Procedure: MITRAL VALVE REPAIR (complex valvuloplasty with 26 mm Sorin Memo 3D ring annuloplasty); CORONARY ARTERY BYPASS GRAFTING X3 (LIMA to LAD, SVG to OM, SVG to RCA, EVH via left thigh); RESECTION OF LV OUTFLOW TRACT MASS; CLOSURE OF PATENT FORAMEN OVALE  PORTABLE CHEST - 1 VIEW  Comparison: 10/08/2011  Findings: Endotracheal tube tip 2.6 cm above carina.  Right internal  jugular Swan-Ganz catheter tip in the main pulmonary artery.  Nasogastric tube tip in the stomach.  Bilateral chest tubes and mediastinal drain are in place.  Epicardial pacer leads noted.  The patient is rotated to the right on today's exam, resulting in reduced diagnostic sensitivity and specificity.   No pneumothorax observed.  Cardiomegaly noted with diffuse interstitial opacity with Kerley B lines compatible with interstitial edema.  Correlate to Swan-Ganz pressure measurements.  Mitral valve prosthesis observed.  Hiatal hernia noted.  Suspected underlying emphysema.  IMPRESSION:  1.  Tubes and lines appears satisfactorily positioned.  No pneumothorax. 2.  Cardiomegaly with interstitial edema.   Original Report Authenticated By: Dellia Cloud, M.D.      Assessment/Plan: S/P Procedure(s) (LRB): MITRAL VALVE REPAIR (MVR) (N/A) CORONARY ARTERY BYPASS GRAFTING (CABG) (N/A) PATENT FORAMEN OVALE CLOSURE (N/A)  1. Doing quite well 2 CT with 700 cc recorded/24 hours, leave CT  for now 3 start gentle diuresis 4 push rehab/pulm toilet as able 5 labs stable    Jonnie Kubly E 10/14/2011 8:33 AM

## 2011-10-14 NOTE — Progress Notes (Signed)
Hypoglycemic Event  CBG: 67  Treatment: D50 IV 25 mL  Symptoms: None  Follow-up CBG: Time:0017 CBG Result:131  Possible Reasons for Event: Inadequate meal intake  Comments/MD notified:pt refused PO/treated per protocol    Victoria Sexton  Remember to initiate Hypoglycemia Order Set & complete

## 2011-10-15 ENCOUNTER — Encounter (HOSPITAL_COMMUNITY): Payer: Self-pay | Admitting: Thoracic Surgery (Cardiothoracic Vascular Surgery)

## 2011-10-15 LAB — GLUCOSE, CAPILLARY
Glucose-Capillary: 124 mg/dL — ABNORMAL HIGH (ref 70–99)
Glucose-Capillary: 124 mg/dL — ABNORMAL HIGH (ref 70–99)
Glucose-Capillary: 113 mg/dL — ABNORMAL HIGH (ref 70–99)

## 2011-10-15 LAB — CBC
HCT: 31 % — ABNORMAL LOW (ref 36.0–46.0)
Hemoglobin: 9.9 g/dL — ABNORMAL LOW (ref 12.0–15.0)
MCHC: 31.9 g/dL (ref 30.0–36.0)
MCV: 78.3 fL (ref 78.0–100.0)
RDW: 17.3 % — ABNORMAL HIGH (ref 11.5–15.5)

## 2011-10-15 LAB — BASIC METABOLIC PANEL
BUN: 14 mg/dL (ref 6–23)
Chloride: 101 mEq/L (ref 96–112)
Creatinine, Ser: 0.41 mg/dL — ABNORMAL LOW (ref 0.50–1.10)
GFR calc Af Amer: >90 mL/min (ref >90)
GFR calc non Af Amer: >90 mL/min (ref >90)
Glucose, Bld: 119 mg/dL — ABNORMAL HIGH (ref 70–99)

## 2011-10-15 MED ORDER — BOOST / RESOURCE BREEZE PO LIQD
1.0000 | Freq: Three times a day (TID) | ORAL | Status: DC
  Administered 2011-10-15 – 2011-10-19 (×10): 1 via ORAL

## 2011-10-15 MED ORDER — METOPROLOL TARTRATE 25 MG PO TABS
25.0000 mg | ORAL_TABLET | Freq: Two times a day (BID) | ORAL | Status: DC
  Administered 2011-10-15 – 2011-10-19 (×9): 25 mg via ORAL
  Filled 2011-10-15 (×11): qty 1

## 2011-10-15 NOTE — Progress Notes (Signed)
Pt,s foley and Right internal jugular d/c per order. Pt tolerated well, occlusive dressing applied to R IJ,. Will continue to monitor.

## 2011-10-15 NOTE — Progress Notes (Signed)
Physical Therapy Treatment Patient Details Name: Victoria Sexton MRN: [id number] DOB: [birth year] Today's Date: 10/15/2011 Time: 1191-4782 PT Time Calculation (min): 38 min  PT Assessment / Plan / Recommendation Comments on Treatment Session  Pt admitted s/p CABG with MVR. Pt very motivated and progressing great with therapy. Able to increase ambulation distance and activity tolerance today.    Follow Up Recommendations  Home health PT;Supervision/Assistance - 24 hour     Does the patient have the potential to tolerate intense rehabilitation     Barriers to Discharge        Equipment Recommendations  3 in 1 bedside comode    Recommendations for Other Services    Frequency Min 3X/week   Plan Discharge plan remains appropriate;Frequency remains appropriate    Precautions / Restrictions Precautions Precautions: Sternal Precaution Comments: Bilateral chest tubes. Re-educated on sternal precautions. Restrictions Weight Bearing Restrictions: No   Pertinent Vitals/Pain None    Mobility  Bed Mobility Bed Mobility: Not assessed Transfers Transfers: Sit to Stand;Stand to Sit Sit to Stand: 4: Min guard;From chair/3-in-1 Stand to Sit: 4: Min assist;To chair/3-in-1 Details for Transfer Assistance: Guarding for balance with cues to limit use of bilateral UEs due to sternal precautions. Assist to slow safe descent to chair. Ambulation/Gait Ambulation/Gait Assistance: 4: Min assist Ambulation Distance (Feet): 70 Feet Assistive device: Rolling walker Ambulation/Gait Assistance Details: Assist for balance and safety directing RW around obstacles. Cues for tall posture and safe sequence. Gait Pattern: Step-through pattern;Decreased stride length;Trunk flexed Stairs: No Wheelchair Mobility Wheelchair Mobility: No    Exercises     PT Diagnosis:    PT Problem List:   PT Treatment Interventions:     PT Goals Acute Rehab PT Goals PT Goal Formulation: With patient/family Time For Goal  Achievement: 10/28/11 Potential to Achieve Goals: Good PT Goal: Sit to Stand - Progress: Progressing toward goal PT Goal: Stand to Sit - Progress: Progressing toward goal PT Goal: Ambulate - Progress: Progressing toward goal  Visit Information  Last PT Received On: 10/15/11 Assistance Needed: +1    Subjective Data  Subjective: "I would love to try to do as much as I can." Patient Stated Goal: none stated but agreeable to mobilzing to meet needs for return home with spouse   Cognition  Overall Cognitive Status: Impaired Area of Impairment: Memory Arousal/Alertness: Awake/alert Orientation Level: Appears intact for tasks assessed Behavior During Session: [facility] for tasks performed Memory Deficits: Unable to recall walking yesterday, but still agreeable to mobilize today.    Balance  Balance Balance Assessed: No  End of Session PT - End of Session Equipment Utilized During Treatment: Gait belt Activity Tolerance: Patient tolerated treatment well Patient left: in chair;with call bell/phone within reach;with family/visitor present;with chair alarm set Nurse Communication: Mobility status   GP     Cephus Shelling 10/15/2011, 11:15 AM  10/15/2011 Cephus Shelling, PT, DPT [phone]

## 2011-10-15 NOTE — Progress Notes (Addendum)
                   [address]            [place] [address]          [phone]      3 Days Post-Op Procedure(s) (LRB): MITRAL VALVE REPAIR (MVR) (N/A) CORONARY ARTERY BYPASS GRAFTING (CABG) (N/A) PATENT FORAMEN OVALE CLOSURE (N/A)  Subjective: Patient was sleeping in bed but every time she moved the alarm on the bed went off. She finally started to fall asleep in the chair this morning.  Objective: Vital signs in last 24 hours: Patient Vitals for the past 24 hrs:  BP Temp Temp src Pulse Resp SpO2 Weight  10/15/11 0537 151/71 mmHg 97.9 F (36.6 C) Oral 99  19  99 % 102 lb 4.7 oz (46.4 kg)  10/14/11 2027 148/80 mmHg 97.9 F (36.6 C) Oral 99  22  93 % -  10/14/11 1401 - - - 88  - 94 % -  10/14/11 1251 149/79 mmHg 97.7 F (36.5 C) Oral 81  18  94 % -  10/14/11 1200 115/61 mmHg - - 82  24  91 % -  10/14/11 1119 - 99 F (37.2 C) Oral - - - -  10/14/11 1100 137/62 mmHg - - 103  26  92 % -  10/14/11 1000 147/64 mmHg - - 101  27  93 % -  10/14/11 0900 138/65 mmHg - - 101  29  91 % -   Pre op weight:  44.9 kg Current Weight  10/15/11 102 lb 4.7 oz (46.4 kg)      Intake/Output from previous day: 10/10 0701 - 10/11 0700 In: 242 [I.V.:40; IV Piggyback:202] Out: 2986 [Urine:2500; Chest Tube:486]   Physical Exam:  Cardiovascular: RRR, no murmurs Pulmonary: Mostly clear to auscultation bilaterally Abdomen: Soft, non tender, bowel sounds present. Extremities: SCDs in place Wounds: Clean and dry.  No erythema or signs of infection.  Lab Results: CBC: Basename 10/15/11 0530 10/14/11 0455  WBC 12.5* 13.7*  HGB 9.9* 9.8*  HCT 31.0* 31.1*  PLT 105* 112*   BMET:  Basename 10/15/11 0530 10/14/11 0455  NA 136 138  K 3.9 3.5  CL 101 103  CO2 25 24  GLUCOSE 119* 109*  BUN 14 14  CREATININE 0.41* 0.48*  CALCIUM 8.8 8.7    PT/INR:  Lab Results  Component Value Date   INR 1.23 10/15/2011   INR 1.38 10/12/2011   INR 1.06 10/08/2011   ABG:   INR: Will add last result for INR, ABG once components are confirmed Will add last 4 CBG results once components are confirmed  Assessment/Plan:  1. CV - SR. On Lopressor 12.5 bid, Coumadin.Will increase Lopressor to 25 bid for better HR and BP control. 2.  Pulmonary - Encourage incentive spirometer.Chest tube output 586 (total for two). To remain for now. 3.  Acute blood loss anemia -  H and H stable at 9.9 and 31. 4.HGA1C pre op 5.9. Will require further surveillance as an outpatient.  ZIMMERMAN,DONIELLE MPA-C 10/15/2011,8:17 AM   I have seen and examined the patient and agree with the assessment and plan as outlined.  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  Johnatha Zeidman H 10/15/2011 8:49 AM

## 2011-10-15 NOTE — Progress Notes (Signed)
1500 Came to walk with pt. Sleeping soundly. Offered to walk with pt but she stated she was too tired. Had worked with PT and OT today.  Encouraged pt to walk with staff this evening. Pt stated that she had done her walking for today. Discussed with pt that 3 walks daily is goal here. Mikie Misner DunlapRN

## 2011-10-15 NOTE — Evaluation (Signed)
Occupational Therapy Evaluation Patient Details Name: Victoria Sexton MRN: [id number] DOB: [birth year] Today's Date: 10/15/2011 Time: 9629-5284 OT Time Calculation (min): 17 min  OT Assessment / Plan / Recommendation Clinical Impression  Overall, pt is set-up grooming, Min-Mod A UB/LB bathing/dressing while in supported/unsupported sitting in chair. Transfers to 3:1 from chair level = Min A. Pt will benefit from acute OT services while in hospital to address deficits in ADL's, self care & sternal precautions. Pt husband reports that he will assist 24hr/day as needed at d/c. Pt will need 3:1 & RW (husband states that he can not find old walker)    OT Assessment  Patient needs continued OT Services    Follow Up Recommendations  No OT follow up          Equipment Recommendations  3 in 1 bedside comode;Rolling walker with 5" wheels    Recommendations for Other Services    Frequency  Min 2X/week    Precautions / Restrictions Precautions Precautions: Sternal Precaution Comments: Bilateral chest tubes. Re-educated on sternal precautions. Restrictions Weight Bearing Restrictions: No   Pertinent Vitals/Pain Pt w/o c/o pain during assessment, only c/o fatigue/lethergy    ADL  Eating/Feeding: Simulated;Modified independent Where Assessed - Eating/Feeding: Chair Grooming: Simulated;Set up Where Assessed - Grooming: Supported sitting Upper Body Bathing: Simulated;Minimal assistance Where Assessed - Upper Body Bathing: Supported sitting Lower Body Bathing: Simulated;Moderate assistance Where Assessed - Lower Body Bathing: Supported sitting;Supported sit to stand Upper Body Dressing: Simulated;Set up Where Assessed - Upper Body Dressing: Supported sitting;Unsupported sitting Lower Body Dressing: Performed;Minimal assistance Where Assessed - Lower Body Dressing: Supported sit to stand Toilet Transfer: Mining engineer Method: Sit to Writer: Bedside commode Toileting - Clothing Manipulation and Hygiene: Simulated;Min guard Where Assessed - Engineer, mining and Hygiene: Sit on 3-in-1 or toilet Tub/Shower Transfer Method: Not assessed Equipment Used: Rolling walker;Other (comment) (Sternal pillow, gait belt) Transfers/Ambulation Related to ADLs: Pt currently Min guard assist sit to stand from chair/3:1 ADL Comments: Overall, pt is set-up grooming, Min-Mod A UB/LB bathing/dressing while in supported/unsupported sitting in chair. Transfers to 3:1 from chair level = Min A. Pt will benefit from acute OT services while in hospital to address deficits in ADL's and self care. Pt husband reports that he will assist 24hr/day as needed at d/c. Pt will need 3:1 & RW (husband states that he can not find old walker).      OT Diagnosis: Generalized weakness;Acute pain  OT Problem List: Decreased activity tolerance;Decreased strength;Impaired balance (sitting and/or standing);Decreased safety awareness;Decreased knowledge of use of DME or AE;Decreased knowledge of precautions;Cardiopulmonary status limiting activity OT Treatment Interventions: Self-care/ADL training;DME and/or AE instruction;Therapeutic activities;Patient/family education   OT Goals Acute Rehab OT Goals OT Goal Formulation: With patient/family Time For Goal Achievement: 10/29/11 Potential to Achieve Goals: Good ADL Goals Pt Will Perform Grooming: with modified independence;with set-up;Sitting, edge of bed;Sitting, chair;with cueing (comment type and amount) ADL Goal: Grooming - Progress: Goal set today Pt Will Perform Upper Body Bathing: with supervision;with set-up;Sitting at sink;Sitting, edge of bed;with cueing (comment type and amount) ADL Goal: Upper Body Bathing - Progress: Goal set today Pt Will Perform Lower Body Bathing: with min assist;with caregiver independent in assisting;Sit to stand from chair;Sit to stand from bed;with cueing (comment type  and amount);with adaptive equipment ADL Goal: Lower Body Bathing - Progress: Goal set today Pt Will Perform Upper Body Dressing: with supervision;with set-up;Sitting, chair;Sitting, bed ADL Goal: Upper Body Dressing - Progress: Goal set today  Pt Will Perform Lower Body Dressing: with supervision;with min assist;with caregiver independent in assisting;Sit to stand from bed;Sit to stand from chair ADL Goal: Lower Body Dressing - Progress: Goal set today Pt Will Transfer to Toilet: with supervision;with caregiver independent in assisting;Ambulation;3-in-1;with DME;with cueing (comment type and amount) ADL Goal: Toilet Transfer - Progress: Goal set today Pt Will Perform Toileting - Clothing Manipulation: with supervision;with caregiver independent in assisting;Sitting on 3-in-1 or toilet ADL Goal: Toileting - Clothing Manipulation - Progress: Goal set today Pt Will Perform Toileting - Hygiene: with supervision;with caregiver independent in assisting;Sitting on 3-in-1 or toilet ADL Goal: Toileting - Hygiene - Progress: Goal set today  Visit Information  Last OT Received On: 10/15/11 Assistance Needed: +1    Subjective Data  Subjective: Pt and pt's husband report that they would like for pt to go home w/ husband 24 hr assist PRN for ADL's Patient Stated Goal: Return home   Prior Functioning     Home Living Lives With: Spouse Available Help at Discharge: Family;Available 24 hours/day Type of Home: House Home Access: Stairs to enter [employer] of Steps: 5 Entrance Stairs-Rails: Right;Left Home Layout: Bed/bath upstairs;Two level;1/2 bath on main level Alternate Level Stairs-Number of Steps: 14 Alternate Level Stairs-Rails: Left;Right (alternates sides ) Bathroom Shower/Tub: Walk-in shower;Door [employer] Toilet: Standard Home Adaptive Equipment: Wheelchair - manual Prior Function Level of Independence: Independent Able to Take Stairs?: Yes Driving: Yes Vocation:  Retired Musician: No difficulties Dominant Hand: Right         Cognition  Overall Cognitive Status: Impaired Area of Impairment: Memory Arousal/Alertness: Lethargic Orientation Level: Appears intact for tasks assessed Behavior During Session: [facility] for tasks performed Memory: Decreased recall of precautions Memory Deficits: Unable to recall walking yesterday, but still agreeable to mobilize today.    Extremity/Trunk Assessment Right Upper Extremity Assessment RUE ROM/Strength/Tone: WFL for tasks assessed RUE Sensation: WFL - Light Touch RUE Coordination: WFL - gross/fine motor Left Upper Extremity Assessment LUE ROM/Strength/Tone: WFL for tasks assessed LUE Sensation: WFL - Light Touch LUE Coordination: WFL - gross/fine motor     Mobility Bed Mobility Bed Mobility: Not assessed Transfers Transfers: Sit to Stand;Stand to Sit Sit to Stand: 4: Min guard;From chair/3-in-1 Stand to Sit: 4: Min guard;To chair/3-in-1 Details for Transfer Assistance: Min guard assist for cues (sternal precautions during transfers) & balance, as well as for slow decent to chair.                Balance Balance Balance Assessed: No   End of Session OT - End of Session Equipment Utilized During Treatment: Gait belt;Other (comment) (Sternal Pillow, RW) Activity Tolerance: Patient tolerated treatment well;Patient limited by fatigue Patient left: in chair;with call bell/phone within reach;with chair alarm set;with family/visitor present  GO     Roselie Awkward Dixon 10/15/2011, 12:01 PM

## 2011-10-16 MED ORDER — WARFARIN SODIUM 2.5 MG PO TABS
2.5000 mg | ORAL_TABLET | Freq: Every day | ORAL | Status: DC
  Administered 2011-10-16: 2.5 mg via ORAL
  Filled 2011-10-16 (×2): qty 1

## 2011-10-16 NOTE — Progress Notes (Signed)
CARDIAC REHAB PHASE I   PRE:  Rate/Rhythm: 100 PVCs  BP:  Supine:   Sitting: 140/82  Standing:    SaO2: 97% RA  MODE:  Ambulation: 150 ft   POST:  Rate/Rhythm: 113 ST  BP:  Supine:   Sitting: 134/50  Standing:    SaO2: 94% RA  1026-1055 Pt ambulated 150 ft with assist x2 and pushing rolling walker. Tolerated ambulation well, no c/o, VSS, to chair after walk with legs elevated. Encouraged IS use.  Annetta Maw

## 2011-10-16 NOTE — Progress Notes (Addendum)
4 Days Post-Op Procedure(s) (LRB): MITRAL VALVE REPAIR (MVR) (N/A) CORONARY ARTERY BYPASS GRAFTING (CABG) (N/A) PATENT FORAMEN OVALE CLOSURE (N/A) Subjective:  Ms. Brush has no complaints this morning.  She is ambulating some.  No bowel movement yet and she denies passing flatus  Objective: Vital signs in last 24 hours: Temp:  [98 F (36.7 C)-98.8 F (37.1 C)] 98 F (36.7 C) (10/12 0500) Pulse Rate:  [90-101] 90  (10/12 0500) Cardiac Rhythm:  [-] Normal sinus rhythm;Sinus tachycardia (10/11 2000) Resp:  [18-20] 19  (10/12 0500) BP: (107-138)/(60-64) 107/60 mmHg (10/12 0500) SpO2:  [94 %-96 %] 94 % (10/12 0500) Weight:  [101 lb 13.6 oz (46.2 kg)] 101 lb 13.6 oz (46.2 kg) (10/12 0500)  Intake/Output from previous day: 10/11 0701 - 10/12 0700 In: 460 [P.O.:460] Out: 2000 [Urine:1650; Chest Tube:350]  General appearance: alert, cooperative and no distress Heart: regular rate and rhythm Lungs: clear to auscultation bilaterally Abdomen: soft, non-tender; bowel sounds normal; no masses,  no organomegaly Extremities: edema trace Wound: clean anddry  Lab Results:  Basename 10/15/11 0530 10/14/11 0455  WBC 12.5* 13.7*  HGB 9.9* 9.8*  HCT 31.0* 31.1*  PLT 105* 112*   BMET:  Basename 10/15/11 0530 10/14/11 0455  NA 136 138  K 3.9 3.5  CL 101 103  CO2 25 24  GLUCOSE 119* 109*  BUN 14 14  CREATININE 0.41* 0.48*  CALCIUM 8.8 8.7    PT/INR:  Basename 10/15/11 0530  LABPROT 15.3*  INR 1.23   ABG    Component Value Date/Time   PHART 7.342* 10/12/2011 2122   HCO3 25.9* 10/12/2011 2122   TCO2 24 10/12/2011 2128   ACIDBASEDEF 2.0 10/12/2011 1453   O2SAT 99.0 10/12/2011 2122   CBG (last 3)   Basename 10/15/11 1634 10/15/11 0616 10/14/11 2054  GLUCAP 113* 124* 124*    Assessment/Plan: S/P Procedure(s) (LRB): MITRAL VALVE REPAIR (MVR) (N/A) CORONARY ARTERY BYPASS GRAFTING (CABG) (N/A) PATENT FORAMEN OVALE CLOSURE (N/A)  1. CV- NSR good rate and pressure control,  continue Lopressor 2. INR- 1.27, minimal response to 2mg , will increase slowly to 2.5mg  daily due to advanced age 78. Chest tubes- output decreasing 360cc output yesterday, I marked chambers can hopefully d/c tubes tomorrow 4. Acute post operative anemia- stable  5. Volume overload- patients weight minimally up since surgery, continue lasix for now 6. LOC Constipation- will continue stool softener, if necessary can add Lactulose tomorrow 7. Dispo- patient making progress, will d/c EPW since on Coumadin, will leave chest tubes for now hopefully can remove in AM   LOS: 4 days    Victoria Sexton, Victoria Sexton 10/16/2011   I have seen and examined the patient and agree with the assessment and plan as outlined although she has only received 2 doses of coumadin so far, she may not have yet had time to respond  [name] 10/16/2011 11:39 AM

## 2011-10-16 NOTE — Progress Notes (Signed)
Pt ambulated 150 ft with rolling walker. Assist x 2 to assist with chest tubes. Back to chair after ambulation. Victoria Sexton

## 2011-10-16 NOTE — Progress Notes (Signed)
Courtesy visit  Doing well Chest tubes still in place Feels better Benign LV mass  Will continue to follow

## 2011-10-16 NOTE — Progress Notes (Signed)
EPW d/c per MD order at 12:59 PM. All wires intact upon removal. VSS. INR WNL. Pt tolerated well. Bed rest unil 1350. Call bell in reach. Dion Saucier

## 2011-10-17 ENCOUNTER — Inpatient Hospital Stay (HOSPITAL_COMMUNITY): Payer: Medicare Other

## 2011-10-17 LAB — PROTIME-INR: Prothrombin Time: 19.5 seconds — ABNORMAL HIGH (ref 11.6–15.2)

## 2011-10-17 MED ORDER — LACTULOSE 10 GM/15ML PO SOLN
10.0000 g | Freq: Every day | ORAL | Status: DC
  Administered 2011-10-17 – 2011-10-18 (×2): 10 g via ORAL
  Filled 2011-10-17 (×3): qty 15

## 2011-10-17 MED ORDER — WARFARIN VIDEO
Freq: Once | Status: AC
  Administered 2011-10-17: 17:00:00

## 2011-10-17 MED ORDER — METOPROLOL TARTRATE 25 MG PO TABS: 25.0000 mg | ORAL_TABLET | Freq: Two times a day (BID) | ORAL | Status: DC

## 2011-10-17 MED ORDER — WARFARIN SODIUM 2 MG PO TABS: 2.0000 mg | ORAL_TABLET | Freq: Every day | ORAL | Status: DC

## 2011-10-17 MED ORDER — TRAMADOL HCL 50 MG PO TABS: 50.0000 mg | ORAL_TABLET | Freq: Four times a day (QID) | ORAL | Status: DC | PRN

## 2011-10-17 MED ORDER — PATIENT'S GUIDE TO USING COUMADIN BOOK
Freq: Once | Status: AC
  Administered 2011-10-17: 17:00:00
  Filled 2011-10-17: qty 1

## 2011-10-17 MED ORDER — FUROSEMIDE 40 MG PO TABS: 40.0000 mg | ORAL_TABLET | Freq: Two times a day (BID) | ORAL | Status: DC

## 2011-10-17 MED ORDER — POTASSIUM CHLORIDE CRYS ER 20 MEQ PO TBCR: 20.0000 meq | EXTENDED_RELEASE_TABLET | Freq: Two times a day (BID) | ORAL | Status: DC

## 2011-10-17 MED ORDER — LISINOPRIL 5 MG PO TABS
5.0000 mg | ORAL_TABLET | Freq: Every day | ORAL | Status: DC
  Administered 2011-10-18 – 2011-10-19 (×2): 5 mg via ORAL
  Filled 2011-10-17 (×2): qty 1

## 2011-10-17 MED ORDER — SIMVASTATIN 5 MG PO TABS: 5.0000 mg | ORAL_TABLET | Freq: Every day | ORAL | Status: DC

## 2011-10-17 MED ORDER — WARFARIN SODIUM 2 MG PO TABS
2.0000 mg | ORAL_TABLET | Freq: Every day | ORAL | Status: DC
  Administered 2011-10-17: 2 mg via ORAL
  Filled 2011-10-17 (×2): qty 1

## 2011-10-17 NOTE — Progress Notes (Signed)
No complaints Continues to progress Possible DC tomorrow

## 2011-10-17 NOTE — Progress Notes (Addendum)
5 Days Post-Op Procedure(s) (LRB): MITRAL VALVE REPAIR (MVR) (N/A) CORONARY ARTERY BYPASS GRAFTING (CABG) (N/A) PATENT FORAMEN OVALE CLOSURE (N/A)  Subjective: [name] has no complaints this morning.  She has yet to have a BM since surgery  Objective: Vital signs in last 24 hours: Temp:  [97.4 F (36.3 C)-98.7 F (37.1 C)] 98.3 F (36.8 C) (10/13 0605) Pulse Rate:  [81-101] 82  (10/13 0605) Cardiac Rhythm:  [-] Sinus tachycardia (10/12 1945) Resp:  [18-20] 20  (10/13 0605) BP: (113-153)/(58-82) 142/82 mmHg (10/13 0605) SpO2:  [93 %-99 %] 93 % (10/13 0605) Weight:  [95 lb 3.2 oz (43.182 kg)] 95 lb 3.2 oz (43.182 kg) (10/13 0605)  Intake/Output from previous day: 10/12 0701 - 10/13 0700 In: -  Out: 1140 [Urine:1100; Chest Tube:40]  General appearance: alert, cooperative and no distress Heart: regular rate and rhythm Lungs: clear to auscultation bilaterally Abdomen: soft, non-tender; bowel sounds normal; no masses,  no organomegaly Extremities: extremities normal, atraumatic, no cyanosis or edema Wound: clean and dry  Lab Results:  Basename 10/15/11 0530  WBC 12.5*  HGB 9.9*  HCT 31.0*  PLT 105*   BMET:  Basename 10/15/11 0530  NA 136  K 3.9  CL 101  CO2 25  GLUCOSE 119*  BUN 14  CREATININE 0.41*  CALCIUM 8.8    PT/INR:  Basename 10/17/11 0640  LABPROT 19.5*  INR 1.71*   ABG    Component Value Date/Time   PHART 7.342* 10/12/2011 2122   HCO3 25.9* 10/12/2011 2122   TCO2 24 10/12/2011 2128   ACIDBASEDEF 2.0 10/12/2011 1453   O2SAT 99.0 10/12/2011 2122   CBG (last 3)   Basename 10/15/11 1634 10/15/11 0616 10/14/11 2054  GLUCAP 113* 124* 124*    Assessment/Plan: S/P Procedure(s) (LRB): MITRAL VALVE REPAIR (MVR) (N/A) CORONARY ARTERY BYPASS GRAFTING (CABG) (N/A) PATENT FORAMEN OVALE CLOSURE (N/A)  1. CV- NSR good rate and pressure control on Lopressor 2. INR 1.71- on Coumadin, will continue 2mg  daily 3. Chest tubes- minimal output yesterday Left  tube with 70cc output and mediastinal tubes with 50cc of output- CXR no evidence of pleural effusion or pneumothorax 4. Acute post operative anemia-stable 5. LOC- constipation will order Lactulose 6. Dispo- patient doing very well, will d/c chest tubes today, if no issues arise home in the AM    LOS: 5 days    BARRETT, ERIN 10/17/2011    I have seen and examined the patient and agree with the assessment and plan as outlined.  Add low dose ACE-I  [name],[name] 10/17/2011 11:07 AM

## 2011-10-17 NOTE — Discharge Summary (Addendum)
Physician Discharge Summary  Patient ID: Victoria Sexton MRN: [id number] DOB/AGE: December 05, 1927 78 y.o.  Admit date: 10/12/2011 Discharge date: 10/19/2011  Admission Diagnoses:  Patient Active Problem List  Diagnosis  . Mitral regurgitation  . Essential hypertension, benign  . Mitral regurgitation due to cusp prolapse  . Cardiac tumor, ventricular  . Mitral valve disorders  . Right inguinal hernia  . Shortness of breath  . Coronary artery disease  . Congestive heart failure   Discharge Diagnoses:   Patient Active Problem List  Diagnosis  . Mitral regurgitation  . Essential hypertension, benign  . Mitral regurgitation due to cusp prolapse  . Cardiac tumor, ventricular  . Mitral valve disorders  . Right inguinal hernia  . Shortness of breath  . Coronary artery disease  . Congestive heart failure  . S/P mitral valve repair  . S/P CABG x 3   Discharged Condition: good  History of Present Illness:   Victoria Sexton is an 78 yo female who is well known by Dr. Cornelius Moras.  She has history of Mitral Valve Prolapse with severe Mitral Regurgitation.  She was originally evaluated by Dr. Cornelius Moras on 11/23/2010 and has been routinely followed since then.  The patient had been doing well since then and was asymptomatic.  However, recently the patient admits to feeling more tired than usual.  She also notices she has been less physically active. However she then developed and acute onset of dyspnea at rest associated with PND and orthopnea.  She also states she has experienced some tightness across her chest.  She presented to the Emergency Department at which time she was found to be in acute CHF.  EKG obtained showed NSR with no evidence of ischemia and normal troponin levels.  She was admitted and responded well to treatment with diuretic therapy.  She underwent catheterization which revealed severe 3 vessel CAD.  She was discharged home and schedule to follow up with Dr. Cornelius Moras for possible surgery.  She  presented to Dr. Orvan July office on 10/04/2011 at which time her Echocardiogram was reviewed and continued to show mitral valve prolapse of the anterior leaflet with severe regurgitation.  Due to the patients symptoms and coronary disease it was felt that she should undergo surgical repair for her condition.  The risks and benefits of the procedure were explained to the patient and she was agreeable to proceed with surgery.  This was scheduled for October 12, 2011.  Hospital Course:   On October 12, 2011 the patient presented to [facility].  She was taken the operating room and underwent Mitral Valve Repair with ring annuloplasty utilizing a Sorin Memo 26mm #D Ring, CABG x3 utilizing LIMA to LAD, SVG to Distal RCA, SVG to OM1, Resection of a cardiac tumor, and Closure of a PFO.  She also underwent Endoscopic Saphenous Vein Harvest from the Left Leg.  She tolerated the procedure well and was taken to the SICU in stable condition.  POD #0 patient was extubated.  POD #1 patient had some minor confusion.  She was weaned off Neo synephrine as tolerated.  She remained atrial paced with underlying Junctional/Block.  Her Mediastinal chest tubes were removed.  POD #2 patient doing well.  Chest tubes in place with increased output.  She was medically stable and transferred to the step down unit.  POD #3 Patient with tachycardia, her Lopressor was increased.  Chest tubes continued to have increased drainage.  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  POD #4  patients INR was 1.27, she will remain on Coumadin at 2mg  daily.  Her external pacing wires were removed without difficulty.  POD #5 her chest tube drainage has decreased.  Chest xray does not show evidence of pleural effusion or pneumothorax.  Her remaining chest tubes were removed without difficulty.  Her INR is 1.71 and she will continue on low dose coumadin.  She is doing very well.  Should no issues arise we will plan for discharge in  the morning pending CXR.  She will follow up with Dr. Cornelius Moras in 3 weeks with a chest xray.  She will need to have a PT/INR checked on Wednesday by Dr. Anne Fu office.  She will also need to follow up with him in 2 weeks time.    Significant Diagnostic Studies:   Cardiac Catheterization  Right atrium (RA): 2/0 mmHg  Right ventricle (RV): 18/0/2 mmHg  Pulmonary artery (PA): 21/6/12 mmHg  Pulmonary capillary wedge pressure (PCWP): 5/2/3 mmHg  Cardiac output: Fick 4.04 liters/min  Cardiac index: Fick 2.99  PA saturation: 64 %  FA saturation: 96 %  Aortic pressure: 105/46/69 mmHg  LV pressure: not done.  ANGIOGRAPHIC DATA:  Left main: Distal heavily calcified, 30-40% narrowing.  Left anterior descending (LAD): Severe ostial stenosis, heavily calcified, 90%. Severely calcified arterial tree, significant LAD disease at the bifurcation of large first diagonal branch. Large mid diagonal branch with significant stenosis proximally of up to 80%. Once again heavily diseased artery.  Circumflex artery (CIRC): Severely calcified vessel, proximal lengthy stenosis of 70-80%. 90% stenosis at the bifurcation of first obtuse marginal branch. There significant collateral vessels traversing from the circumflex distribution to supply the entire right coronary artery up to the ostium.  Right coronary artery (RCA): Occluded ostially. The vessel however has excellent left to right collaterals filling the entire vessel. The right coronary artery is heavily calcified and there appears to be significant stenosis in the mid section of up to 90%.  Aortogram: Ascending aortogram demonstrates aortic arch calcification but no significant aneurysmal segments. The thoracic aorta is tortuous and mildly calcified. The descending aorta demonstrates a fusiform abdominal aortic aneurysm, infrarenal, quite small in caliber. There is moderate disease throughout the iliac vessels bilaterally. There appears to be significant tubular stenosis  in the common iliac vessel on the right of 80%. Mild bilateral renal artery disease.    ECHO:   - Left ventricle: The cavity size was normal. There was moderate concentric hypertrophy. Systolic function was normal. The estimated ejection fraction was in the range of 55% to 60%. Wall motion was asynchronous consistent with bundle branch block; there were no regional wall motion abnormalities. 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 No change.  - Mitral valve: Calcified annulus. Moderately thickened leaflets with anterior prolapse. Moderate to severe regurgitation directed eccentrically wrapping around dilated left atrium  - Left atrium: The atrium was moderately dilated.   Treatments: surgery:   Procedure:  Mitral Valve Repair Complex valvuloplasty including triangular resection of posterior leaflet  Artificial Goretex neocord placement x2  Sorin Memo 3D ring annuloplasty (size 26 mm, catalog #[id number], serial #[id number])  Coronary Artery Bypass Grafting x 3  Left Internal Mammary Artery to Distal Left Anterior Descending Coronary Artery  Saphenous Vein Graft to Distal Right Coronary Artery  Saphenous Vein Graft to Obtuse Marginal Branch of Left Circumflex Coronary Artery  Endoscopic Vein Harvest from Left Thigh and Lower Leg  Resection of Cardiac Tumor Benign-appearing endocardial mass in LV outflow tract  Closure of Patent Foramen Ovale  Disposition: 01-Home or Self Care      Discharge Orders    Future Appointments: Provider: Department: Dept Phone: Center:   05/08/2012 11:00 AM Purcell Nails, MD [facility] [phone] [facility]        Medication List     As of 10/18/2011  9:27 AM    STOP taking these medications         amiodarone 200 MG tablet   Commonly known as: PACERONE      amLODipine 5 MG tablet   Commonly known as: NORVASC      TAKE these medications         aspirin 81 MG tablet   Take 81 mg by mouth daily.       co-enzyme Q-10 30 MG capsule   Take 30 mg by mouth daily.      KRILL OIL PO   Take 300 mg by mouth.      lisinopril 5 MG tablet   Commonly known as: PRINIVIL,ZESTRIL   Take 1 tablet (5 mg total) by mouth daily.      metoprolol tartrate 25 MG tablet   Commonly known as: LOPRESSOR   Take 1 tablet (25 mg total) by mouth 2 (two) times daily.      multivitamin-lutein Caps   Take 1 capsule by mouth daily.      simvastatin 5 MG tablet   Commonly known as: ZOCOR   Take 1 tablet (5 mg total) by mouth at bedtime.      traMADol 50 MG tablet   Commonly known as: ULTRAM   Take 1 tablet (50 mg total) by mouth every 6 (six) hours as needed.      vitamin E 400 UNIT capsule   Take 400 Units by mouth daily.      warfarin 2 MG tablet   Commonly known as: COUMADIN   Take 1 tablet (2 mg total) by mouth daily at 6 PM. Or as directed by Dr. Anne Fu          The patient has been discharged on:   1.Beta Blocker:  Yes [ x ]                              No   [   ]                              If No, reason:  2.Ace Inhibitor/ARB: Yes [  x ]                                     No  [   ]                                     If No, reason:  3.Statin:   Yes [  x]                  No  [   ]                  If No, reason:  4.Ecasa:  Yes  [   x  No   [   ]                  If No, reason:   Follow-up Information    Follow up with Purcell Nails, MD. In 3 weeks. (Office will contact you with appointment)    Contact information:   [address] [employer] [address] [place] [address] [phone]       Follow up with [facility]. In 3 weeks. (Please get chest xray at 8am the day of your appointment with Dr. Cornelius Moras)    Contact information:   [address] [place] [address]       Follow up with Donato Schultz, MD. On 10/21/2011. (PT/INR Check, please contact office for appointment )    Contact information:   [address] [place]  [address] [phone]       Follow up with Donato Schultz, MD. In 2 weeks. (Please contact office to set up follow up appointment)    Contact information:   [address] [place] [address] [phone]          Signed: Lowella Dandy 10/17/2011, 11:00 AM

## 2011-10-17 NOTE — Progress Notes (Signed)
Removed pt's chest tubes per MD order. Pt tolerated well. Pt laying in bed comfortably. Placed occlusive dressing over the site where chest tubes were removed.

## 2011-10-18 ENCOUNTER — Inpatient Hospital Stay (HOSPITAL_COMMUNITY): Payer: Medicare Other

## 2011-10-18 LAB — PROTIME-INR: INR: 2.06 — ABNORMAL HIGH (ref 0.00–1.49)

## 2011-10-18 MED ORDER — FUROSEMIDE 40 MG PO TABS: 40.0000 mg | ORAL_TABLET | Freq: Every day | ORAL | Status: DC

## 2011-10-18 MED ORDER — METOPROLOL TARTRATE 25 MG PO TABS: 25.0000 mg | ORAL_TABLET | Freq: Two times a day (BID) | ORAL | Status: DC

## 2011-10-18 MED ORDER — GUAIFENESIN ER 600 MG PO TB12
600.0000 mg | ORAL_TABLET | Freq: Two times a day (BID) | ORAL | Status: DC
  Administered 2011-10-18 – 2011-10-19 (×2): 600 mg via ORAL
  Filled 2011-10-18 (×4): qty 1

## 2011-10-18 MED ORDER — WARFARIN SODIUM 1 MG PO TABS
1.0000 mg | ORAL_TABLET | Freq: Every day | ORAL | Status: DC
  Administered 2011-10-18: 1 mg via ORAL
  Filled 2011-10-18 (×2): qty 1

## 2011-10-18 MED ORDER — LISINOPRIL 5 MG PO TABS: 5.0000 mg | ORAL_TABLET | Freq: Every day | ORAL | Status: DC

## 2011-10-18 MED ORDER — POTASSIUM CHLORIDE CRYS ER 20 MEQ PO TBCR: 20.0000 meq | EXTENDED_RELEASE_TABLET | Freq: Every day | ORAL | Status: DC

## 2011-10-18 MED ORDER — WARFARIN SODIUM 1 MG PO TABS: 1.0000 mg | ORAL_TABLET | Freq: Every day | ORAL | Status: DC

## 2011-10-18 MED ORDER — WARFARIN SODIUM 2 MG PO TABS: 2.0000 mg | ORAL_TABLET | Freq: Every day | ORAL | Status: DC

## 2011-10-18 NOTE — Progress Notes (Signed)
Pt and husband watching Coumadin video at this time; will cont. To monitor.

## 2011-10-18 NOTE — Progress Notes (Signed)
Pt with productive cough; coughing up thick brown tinged sputum; PA made aware; new orders for Mucinex; will cont. To monitor.

## 2011-10-18 NOTE — Progress Notes (Signed)
  Echocardiogram 2D Echocardiogram has been performed.  Victoria Sexton 10/18/2011, 1:33 PM

## 2011-10-18 NOTE — Progress Notes (Signed)
1327 Came earlier to offer to walk with pt. Advised by PA to hold walk this am and follow up later. PT in with pt now. We will follow up tomorrow. Martha Soltys DunlapRN

## 2011-10-18 NOTE — Progress Notes (Addendum)
                   [address]            [place] [address]          [phone]      6 Days Post-Op Procedure(s) (LRB): MITRAL VALVE REPAIR (MVR) (N/A) CORONARY ARTERY BYPASS GRAFTING (CABG) (N/A) PATENT FORAMEN OVALE CLOSURE (N/A)  Subjective: Patient's only complaint is some shortness of breath, which started last evening. "Not so bad" now.  Objective: Vital signs in last 24 hours: Patient Vitals for the past 24 hrs:  BP Temp Temp src Pulse Resp SpO2 Weight  10/17/11 2100 138/72 mmHg 97.6 F (36.4 C) Oral 99  19  96 % 91 lb 3.2 oz (41.368 kg)  10/17/11 1339 142/74 mmHg 97.5 F (36.4 C) Oral 91  18  97 % -   Pre op weight:  44.9 kg Current Weight  10/17/11 91 lb 3.2 oz (41.368 kg)      Intake/Output from previous day: 10/13 0701 - 10/14 0700 In: 240 [P.O.:240] Out: 950 [Urine:950]   Physical Exam:  Cardiovascular: Slightly tachy, no murmurs Pulmonary: Mostly clear on left;diminished at right base Abdomen: Soft, non tender, bowel sounds present. Extremities: No lower extremity edema Wounds: Clean and dry.  No erythema or signs of infection.  Lab Results: CBC:No results found for this basename: WBC:2,HGB:2,HCT:2,PLT:2 in the last 72 hours BMET: No results found for this basename: NA:2,K:2,CL:2,CO2:2,GLUCOSE:2,BUN:2,CREATININE:2,CALCIUM:2 in the last 72 hours  PT/INR:  Lab Results  Component Value Date   INR 2.06* 10/18/2011   INR 1.71* 10/17/2011   INR 1.27 10/16/2011   ABG:  INR: Will add last result for INR, ABG once components are confirmed Will add last 4 CBG results once components are confirmed  Assessment/Plan:  1. CV - SR. On Lopressor 25 bid and Lisinopril 5 daily, and Coumadin.INR continues to increase-went from 1.71 to 2.06. May need Coumadin 1 mg. 2.  Pulmonary - Encourage incentive spirometer.CXR this am shows no ptx, cardiomegaly, bibasilar atelectasis, right pleural effusion.She is not really able to use incentive much.  Will get flutter valve. 3.  Acute blood loss anemia -  H and H stable at 9.9 and 31. 4.HGA1C pre op 5.9. Will require further surveillance as an outpatient. 5.Possible discharge home  ZIMMERMAN,DONIELLE MPA-C 10/18/2011,7:34 AM    I have seen and examined the patient and agree with the assessment and plan as outlined.  Still subjectively short of breath.  Looks relatively dry on physical exam.  Will hold d/c and recheck labs + CXR in am.  Check f/u ECHO.  Decrease coumadin.  [name],[name] 10/18/2011 9:07 AM

## 2011-10-18 NOTE — Progress Notes (Signed)
Physical Therapy Treatment Patient Details Name: Victoria Sexton MRN: [id number] DOB: [birth year] Today's Date: 10/18/2011 Time: 5643-3295 PT Time Calculation (min): 48 min  PT Assessment / Plan / Recommendation Comments on Treatment Session  Pt was planning to d/c home today after CABG with MVR but is now experiencing increased fatigue and plegm production.  O2 sats 96% on RA and HR 111bpm with ambulation. Encouraged pt to ambulate later with nsg. PT will continue to follow.     Follow Up Recommendations  Home health PT;Supervision/Assistance - 24 hour     Does the patient have the potential to tolerate intense rehabilitation     Barriers to Discharge        Equipment Recommendations  Other (comment);None recommended by PT (pt has equipment)    Recommendations for Other Services    Frequency Min 3X/week   Plan Discharge plan remains appropriate;Frequency remains appropriate    Precautions / Restrictions Precautions Precautions: Sternal Precaution Comments: reviewed bed mobility with sternal precautions Restrictions Weight Bearing Restrictions: No   Pertinent Vitals/Pain O2 sats 96% on RA HR 111 bpm    Mobility  Bed Mobility Bed Mobility: Rolling Left;Left Sidelying to Sit Rolling Left: 5: Supervision;With rail Left Sidelying to Sit: 5: Supervision Details for Bed Mobility Assistance: cueing for rolling first and reaching for rail.  Assist to move covers Transfers Transfers: Sit to Stand;Stand to Sit Sit to Stand: From bed;From toilet;4: Min guard Stand to Sit: 4: Min guard;To chair/3-in-1;To bed;To toilet Details for Transfer Assistance: cues with each transfer to push from surface instead of grabbing RW, pt did not remember from one transfer to another. Able to get up from toilet with grab bar and supervision.  Ambulation/Gait Ambulation/Gait Assistance: 4: Min guard Ambulation Distance (Feet): 150 Feet Assistive device: Rolling walker Ambulation/Gait Assistance  Details: cueing for posture and encouragement in walking to get fluid moving in the chest.  She has been coughing today which is making her tired and not having the energy to ambulate. Gait Pattern: Step-through pattern;Decreased stride length;Trunk flexed Gait velocity: decreased Stairs: No Wheelchair Mobility Wheelchair Mobility: No    Exercises     PT Diagnosis:    PT Problem List:   PT Treatment Interventions:     PT Goals Acute Rehab PT Goals PT Goal Formulation: With patient/family Time For Goal Achievement: 10/28/11 Potential to Achieve Goals: Good Pt will go Supine/Side to Sit: with modified independence PT Goal: Supine/Side to Sit - Progress: Progressing toward goal Pt will go Sit to Supine/Side: with modified independence PT Goal: Sit to Supine/Side - Progress: Progressing toward goal Pt will go Sit to Stand: with supervision PT Goal: Sit to Stand - Progress: Progressing toward goal Pt will go Stand to Sit: with supervision PT Goal: Stand to Sit - Progress: Progressing toward goal Pt will Ambulate: >150 feet;with supervision;with least restrictive assistive device PT Goal: Ambulate - Progress: Progressing toward goal Pt will Go Up / Down Stairs: Flight;with rail(s);with supervision Additional Goals Additional Goal #1: Pt will independently state and adhere to all sternal precautions PT Goal: Additional Goal #1 - Progress: Not progressing  Visit Information  Last PT Received On: 10/18/11 Assistance Needed: +1    Subjective Data  Subjective: I just don't feel good today. I wish I could cough up all of this junk in my chest. I was hoping to go home today Patient Stated Goal: return home   Cognition  Overall Cognitive Status: Impaired Area of Impairment: Memory Arousal/Alertness: Awake/alert Orientation Level: Appears intact  for tasks assessed Behavior During Session: [facility] for tasks performed Memory: Decreased recall of precautions    Balance  Balance Balance  Assessed: Yes Static Standing Balance Static Standing - Balance Support: Left upper extremity supported;During functional activity Static Standing - Level of Assistance: 5: Stand by assistance  End of Session PT - End of Session Equipment Utilized During Treatment: Gait belt Activity Tolerance: Patient limited by fatigue Patient left: in chair;with call bell/phone within reach Nurse Communication: Mobility status   GP    Lyanne Co, PT  Acute Rehab Services  [phone]  Lyanne Co 10/18/2011, 2:02 PM

## 2011-10-19 ENCOUNTER — Inpatient Hospital Stay (HOSPITAL_COMMUNITY): Payer: Medicare Other

## 2011-10-19 LAB — BASIC METABOLIC PANEL
GFR calc Af Amer: >90 mL/min (ref >90)
GFR calc non Af Amer: 87 mL/min — ABNORMAL LOW (ref >90)
Potassium: 2.6 mEq/L — CL (ref 3.5–5.1)
Sodium: 137 mEq/L (ref 135–145)

## 2011-10-19 LAB — CBC
Hemoglobin: 10.5 g/dL — ABNORMAL LOW (ref 12.0–15.0)
RBC: 4.14 MIL/uL (ref 3.87–5.11)

## 2011-10-19 LAB — PROTIME-INR: Prothrombin Time: 23.6 seconds — ABNORMAL HIGH (ref 11.6–15.2)

## 2011-10-19 LAB — PRO B NATRIURETIC PEPTIDE: Pro B Natriuretic peptide (BNP): 2897 pg/mL — ABNORMAL HIGH (ref 0–450)

## 2011-10-19 MED ORDER — POTASSIUM CHLORIDE CRYS ER 20 MEQ PO TBCR: 40.0000 meq | EXTENDED_RELEASE_TABLET | Freq: Two times a day (BID) | ORAL | Status: DC

## 2011-10-19 MED ORDER — GUAIFENESIN ER 600 MG PO TB12: 600.0000 mg | ORAL_TABLET | Freq: Two times a day (BID) | ORAL | Status: DC | PRN

## 2011-10-19 MED ORDER — WARFARIN SODIUM 2 MG PO TABS: 2.0000 mg | ORAL_TABLET | Freq: Every day | ORAL | Status: DC

## 2011-10-19 MED ORDER — WARFARIN SODIUM 2 MG PO TABS
2.0000 mg | ORAL_TABLET | Freq: Every day | ORAL | Status: DC
  Filled 2011-10-19: qty 1

## 2011-10-19 MED ORDER — METOPROLOL TARTRATE 25 MG PO TABS: 25.0000 mg | ORAL_TABLET | Freq: Two times a day (BID) | ORAL | Status: DC

## 2011-10-19 MED ORDER — POTASSIUM CHLORIDE CRYS ER 20 MEQ PO TBCR
40.0000 meq | EXTENDED_RELEASE_TABLET | Freq: Once | ORAL | Status: AC
  Administered 2011-10-19: 40 meq via ORAL
  Filled 2011-10-19: qty 2

## 2011-10-19 MED ORDER — POTASSIUM CHLORIDE CRYS ER 20 MEQ PO TBCR
40.0000 meq | EXTENDED_RELEASE_TABLET | Freq: Three times a day (TID) | ORAL | Status: DC
  Administered 2011-10-19: 40 meq via ORAL

## 2011-10-19 NOTE — Progress Notes (Signed)
Chest tube sutures still intact; PA paged regarding whether or not to d/c sutures prior to d/c home; order to leave sutures in place.

## 2011-10-19 NOTE — Discharge Summary (Signed)
I agree with the above discharge summary and plan for follow-up.  [name],[name]  

## 2011-10-19 NOTE — Progress Notes (Signed)
Critical test result K 2.6 called to RN at this time; PA here to see pt and made aware; orders entered to replace K via PO meds; will cont. To monitor.

## 2011-10-19 NOTE — Progress Notes (Signed)
D/c instructions given to pt and husband; both verbalized understanding; cardiac rehab in room now to further discuss d/c instructions; will cont. To monitor.

## 2011-10-19 NOTE — Progress Notes (Signed)
[id number] Education completed with pt and husband. Encouraged flutter valve and walking for resp status. Permission given by pt to refer to [facility] Phase 2. Malcome Ambrocio DunlapRN

## 2011-10-19 NOTE — Progress Notes (Addendum)
                   [address]            [place] [address]          [phone]      7 Days Post-Op Procedure(s) (LRB): MITRAL VALVE REPAIR (MVR) (N/A) CORONARY ARTERY BYPASS GRAFTING (CABG) (N/A) PATENT FORAMEN OVALE CLOSURE (N/A)  Subjective: Patient with productive cough (brown). Had large bowel movement yesterday followed by a couple of loose stools.  Objective: Vital signs in last 24 hours: Patient Vitals for the past 24 hrs:  BP Temp Temp src Pulse Resp SpO2 Weight  10/19/11 0516 143/67 mmHg 97.4 F (36.3 C) Oral 103  - 97 % 89 lb 1.1 oz (40.4 kg)  10/18/11 2039 107/53 mmHg 97.6 F (36.4 C) Oral 104  24  97 % -  10/18/11 1639 - 97.7 F (36.5 C) Oral - - - -  10/18/11 1417 121/89 mmHg 97.7 F (36.5 C) Oral 98  20  98 % -  10/18/11 1346 - - - 111  - 96 % -  10/18/11 1112 120/68 mmHg - - 108  - - -   Pre op weight:  44.9 kg Current Weight  10/19/11 89 lb 1.1 oz (40.4 kg)      Intake/Output from previous day: 10/14 0701 - 10/15 0700 In: 600 [P.O.:600] Out: 400 [Urine:400]   Physical Exam:  Cardiovascular: Slightly tachy, no murmurs Pulmonary: Mostly clear on left;diminished at right base Abdomen: Soft, non tender, bowel sounds present. Extremities: No lower extremity edema Wounds: Clean and dry.  No erythema or signs of infection.  Lab Results: CBC:  Basename 10/19/11 0614  WBC 11.1*  HGB 10.5*  HCT 32.3*  PLT 150   BMET: No results found for this basename: NA:2,K:2,CL:2,CO2:2,GLUCOSE:2,BUN:2,CREATININE:2,CALCIUM:2 in the last 72 hours  PT/INR:  Lab Results  Component Value Date   INR 2.21* 10/19/2011   INR 2.06* 10/18/2011   INR 1.71* 10/17/2011   ABG:  INR: Will add last result for INR, ABG once components are confirmed Will add last 4 CBG results once components are confirmed  Assessment/Plan:  1. CV - SR. On Lopressor 25 bid and Lisinopril 5 daily, and Coumadin.INR continues to increase-went from 2.06 to 2.21.  Continue Coumadin 1 mg.Echo done yesterday showed LVEF 55%, wall motion normal, questionable moderate mitral stenosis (by mean gradient), mild TR, and no pericardial effusion. 2.  Pulmonary - Encourage incentive spirometer and flutter valve.CXR this am shows questionable right apical ptx, cardiomegaly, bibasilar atelectasis, right pleural effusion.Mcuinex for cough. Questionable start empiric antibiotic for productive cough. 3.  Acute blood loss anemia -  H and H stable at 10.5 and 32.3 4.HGA1C pre op 5.9. Will require further surveillance as an outpatient. 5.Potassium this am is 2.6. Will supplement. 6.Stop stool softeners  ZIMMERMAN,DONIELLE MPA-C 10/19/2011,7:23 AM      I have seen and examined the patient and agree with the assessment and plan as outlined.  However, Mrs Scheurich looks quite good and reports feeling much better than yesterday.  Will d/c home today.  ECHO looks good with no MR and normal LV.  D/C home on coumadin 2 mg daily.  Hephzibah Strehle H 10/19/2011 8:41 AM

## 2011-10-20 DIAGNOSIS — G8918 Other acute postprocedural pain: Secondary | ICD-10-CM | POA: Diagnosis not present

## 2011-10-20 DIAGNOSIS — I2510 Atherosclerotic heart disease of native coronary artery without angina pectoris: Secondary | ICD-10-CM | POA: Diagnosis not present

## 2011-10-20 DIAGNOSIS — I10 Essential (primary) hypertension: Secondary | ICD-10-CM | POA: Diagnosis not present

## 2011-10-20 DIAGNOSIS — I4891 Unspecified atrial fibrillation: Secondary | ICD-10-CM | POA: Diagnosis not present

## 2011-10-20 DIAGNOSIS — Z48812 Encounter for surgical aftercare following surgery on the circulatory system: Secondary | ICD-10-CM | POA: Diagnosis not present

## 2011-10-20 DIAGNOSIS — I509 Heart failure, unspecified: Secondary | ICD-10-CM | POA: Diagnosis not present

## 2011-10-21 DIAGNOSIS — Z7901 Long term (current) use of anticoagulants: Secondary | ICD-10-CM | POA: Diagnosis not present

## 2011-10-26 DIAGNOSIS — I2510 Atherosclerotic heart disease of native coronary artery without angina pectoris: Secondary | ICD-10-CM | POA: Diagnosis not present

## 2011-10-26 DIAGNOSIS — I509 Heart failure, unspecified: Secondary | ICD-10-CM | POA: Diagnosis not present

## 2011-10-26 DIAGNOSIS — I4891 Unspecified atrial fibrillation: Secondary | ICD-10-CM | POA: Diagnosis not present

## 2011-10-26 DIAGNOSIS — I10 Essential (primary) hypertension: Secondary | ICD-10-CM | POA: Diagnosis not present

## 2011-10-26 DIAGNOSIS — Z48812 Encounter for surgical aftercare following surgery on the circulatory system: Secondary | ICD-10-CM | POA: Diagnosis not present

## 2011-10-26 DIAGNOSIS — G8918 Other acute postprocedural pain: Secondary | ICD-10-CM | POA: Diagnosis not present

## 2011-10-28 ENCOUNTER — Ambulatory Visit (INDEPENDENT_AMBULATORY_CARE_PROVIDER_SITE_OTHER): Payer: Self-pay | Admitting: *Deleted

## 2011-10-28 DIAGNOSIS — I2510 Atherosclerotic heart disease of native coronary artery without angina pectoris: Secondary | ICD-10-CM

## 2011-10-28 DIAGNOSIS — Z7901 Long term (current) use of anticoagulants: Secondary | ICD-10-CM | POA: Diagnosis not present

## 2011-10-28 DIAGNOSIS — E46 Unspecified protein-calorie malnutrition: Secondary | ICD-10-CM | POA: Diagnosis not present

## 2011-10-28 DIAGNOSIS — Z9889 Other specified postprocedural states: Secondary | ICD-10-CM | POA: Diagnosis not present

## 2011-10-28 DIAGNOSIS — I340 Nonrheumatic mitral (valve) insufficiency: Secondary | ICD-10-CM

## 2011-10-28 DIAGNOSIS — Z4802 Encounter for removal of sutures: Secondary | ICD-10-CM

## 2011-10-28 DIAGNOSIS — Z09 Encounter for follow-up examination after completed treatment for conditions other than malignant neoplasm: Secondary | ICD-10-CM

## 2011-10-28 DIAGNOSIS — I059 Rheumatic mitral valve disease, unspecified: Secondary | ICD-10-CM | POA: Diagnosis not present

## 2011-10-28 DIAGNOSIS — R000 Tachycardia, unspecified: Secondary | ICD-10-CM | POA: Diagnosis not present

## 2011-10-28 DIAGNOSIS — Z0389 Encounter for observation for other suspected diseases and conditions ruled out: Secondary | ICD-10-CM | POA: Diagnosis not present

## 2011-10-28 NOTE — Progress Notes (Signed)
Victoria Sexton comes to the office for suture removal of 3 previous chest tube sites. These sites, as well as, her sternal incision and left leg endovein harvest site incision are all well healed.  Appetite is fair, but she doesn't like any meat unless it is "fried".  Her husband says she has always been a   "picky eater".  Bowels are good.  Minimal pain and there is no lower extremity edema.  Husband says her blood pressure is creeping up into the 140's and her pulse is around 100.  In going over her meds, she has not been taking her Metoprolol bid as prescribed on d/c, only once a day.  She will begin taking bid and hopefully that will improve her blood pressure and pulse.  She sees Dr. Anne Fu next Friday and Dr. Cornelius Moras as scheduled.

## 2011-10-29 DIAGNOSIS — G8918 Other acute postprocedural pain: Secondary | ICD-10-CM | POA: Diagnosis not present

## 2011-10-29 DIAGNOSIS — I2510 Atherosclerotic heart disease of native coronary artery without angina pectoris: Secondary | ICD-10-CM | POA: Diagnosis not present

## 2011-10-29 DIAGNOSIS — I4891 Unspecified atrial fibrillation: Secondary | ICD-10-CM | POA: Diagnosis not present

## 2011-10-29 DIAGNOSIS — Z23 Encounter for immunization: Secondary | ICD-10-CM | POA: Diagnosis not present

## 2011-10-29 DIAGNOSIS — Z48812 Encounter for surgical aftercare following surgery on the circulatory system: Secondary | ICD-10-CM | POA: Diagnosis not present

## 2011-10-29 DIAGNOSIS — I509 Heart failure, unspecified: Secondary | ICD-10-CM | POA: Diagnosis not present

## 2011-10-29 DIAGNOSIS — I10 Essential (primary) hypertension: Secondary | ICD-10-CM | POA: Diagnosis not present

## 2011-11-02 DIAGNOSIS — I059 Rheumatic mitral valve disease, unspecified: Secondary | ICD-10-CM | POA: Diagnosis not present

## 2011-11-02 DIAGNOSIS — I10 Essential (primary) hypertension: Secondary | ICD-10-CM | POA: Diagnosis not present

## 2011-11-02 DIAGNOSIS — R000 Tachycardia, unspecified: Secondary | ICD-10-CM | POA: Diagnosis not present

## 2011-11-02 DIAGNOSIS — I2510 Atherosclerotic heart disease of native coronary artery without angina pectoris: Secondary | ICD-10-CM | POA: Diagnosis not present

## 2011-11-03 DIAGNOSIS — I4891 Unspecified atrial fibrillation: Secondary | ICD-10-CM | POA: Diagnosis not present

## 2011-11-03 DIAGNOSIS — Z48812 Encounter for surgical aftercare following surgery on the circulatory system: Secondary | ICD-10-CM | POA: Diagnosis not present

## 2011-11-03 DIAGNOSIS — I509 Heart failure, unspecified: Secondary | ICD-10-CM | POA: Diagnosis not present

## 2011-11-03 DIAGNOSIS — G8918 Other acute postprocedural pain: Secondary | ICD-10-CM | POA: Diagnosis not present

## 2011-11-03 DIAGNOSIS — I10 Essential (primary) hypertension: Secondary | ICD-10-CM | POA: Diagnosis not present

## 2011-11-03 DIAGNOSIS — I2510 Atherosclerotic heart disease of native coronary artery without angina pectoris: Secondary | ICD-10-CM | POA: Diagnosis not present

## 2011-11-08 DIAGNOSIS — R5381 Other malaise: Secondary | ICD-10-CM | POA: Diagnosis not present

## 2011-11-11 ENCOUNTER — Other Ambulatory Visit: Payer: Self-pay | Admitting: Thoracic Surgery (Cardiothoracic Vascular Surgery)

## 2011-11-11 DIAGNOSIS — Z951 Presence of aortocoronary bypass graft: Secondary | ICD-10-CM

## 2011-11-11 DIAGNOSIS — Z9889 Other specified postprocedural states: Secondary | ICD-10-CM

## 2011-11-11 DIAGNOSIS — Z7901 Long term (current) use of anticoagulants: Secondary | ICD-10-CM | POA: Diagnosis not present

## 2011-11-15 ENCOUNTER — Ambulatory Visit (INDEPENDENT_AMBULATORY_CARE_PROVIDER_SITE_OTHER): Payer: Self-pay | Admitting: Thoracic Surgery (Cardiothoracic Vascular Surgery)

## 2011-11-15 ENCOUNTER — Ambulatory Visit
Admission: RE | Admit: 2011-11-15 | Discharge: 2011-11-15 | Disposition: A | Discharge status: Home / Self Care | Payer: Medicare Other | Source: Ambulatory Visit | Attending: Thoracic Surgery (Cardiothoracic Vascular Surgery) | Admitting: Thoracic Surgery (Cardiothoracic Vascular Surgery)

## 2011-11-15 ENCOUNTER — Encounter: Payer: Self-pay | Admitting: Thoracic Surgery (Cardiothoracic Vascular Surgery)

## 2011-11-15 VITALS — BP 130/74 | HR 95 | Resp 18 | Ht 65.0 in | Wt 95.0 lb

## 2011-11-15 DIAGNOSIS — I340 Nonrheumatic mitral (valve) insufficiency: Secondary | ICD-10-CM

## 2011-11-15 DIAGNOSIS — Z9889 Other specified postprocedural states: Secondary | ICD-10-CM

## 2011-11-15 DIAGNOSIS — Z951 Presence of aortocoronary bypass graft: Secondary | ICD-10-CM

## 2011-11-15 DIAGNOSIS — J449 Chronic obstructive pulmonary disease, unspecified: Secondary | ICD-10-CM | POA: Diagnosis not present

## 2011-11-15 DIAGNOSIS — I2510 Atherosclerotic heart disease of native coronary artery without angina pectoris: Secondary | ICD-10-CM

## 2011-11-15 DIAGNOSIS — J90 Pleural effusion, not elsewhere classified: Secondary | ICD-10-CM | POA: Diagnosis not present

## 2011-11-15 DIAGNOSIS — I059 Rheumatic mitral valve disease, unspecified: Secondary | ICD-10-CM

## 2011-11-15 DIAGNOSIS — Z09 Encounter for follow-up examination after completed treatment for conditions other than malignant neoplasm: Secondary | ICD-10-CM

## 2011-11-15 DIAGNOSIS — D4989 Neoplasm of unspecified behavior of other specified sites: Secondary | ICD-10-CM

## 2011-11-15 MED ORDER — METOPROLOL TARTRATE 25 MG PO TABS: 25.0000 mg | ORAL_TABLET | Freq: Three times a day (TID) | ORAL | Status: DC

## 2011-11-15 NOTE — Progress Notes (Signed)
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  The patient's postoperative recovery in the hospital was uncomplicated.  Since hospital discharge she has continued to do exceptionally well. She has had her prothrombin time monitored and Coumadin dose adjusted to Dr. Anne Fu office. She has seen Dr. Anne Fu and followup and apparently her dose of the Toprol has been adjusted. The patient and her husband are very pleased with her progress. She has had minimal soreness in her chest and she is not taking any sort of pain relievers whatsoever. She has no shortness of breath and notes that her breathing is much better than it was prior to surgery. She is ambulating reasonably well although she has not been getting out of the house at all. She has not had a very good appetite. She's had some mild lower extremity edema in the left lower leg, but this has been resolved despite keeping her leg propped up in the evenings when she sleeps.   Current Outpatient Prescriptions  Medication Sig Dispense Refill  . aspirin 81 MG tablet Take 81 mg by mouth daily.       [name] co-enzyme Q-10 30 MG capsule Take 30 mg by mouth daily.       [name] guaiFENesin (MUCINEX) 600 MG 12 hr tablet Take 1 tablet (600 mg total) by mouth 2 (two) times daily as needed for congestion.      [name] KRILL OIL PO Take 300 mg by mouth.      [name] lisinopril (PRINIVIL,ZESTRIL) 5 MG tablet Take 1 tablet (5 mg total) by mouth daily.  30 tablet  1  . metoprolol  tartrate (LOPRESSOR) 25 MG tablet Take 1 tablet (25 mg total) by mouth 2 (two) times daily.  60 tablet  1  . multivitamin-lutein (OCUVITE-LUTEIN) CAPS Take 1 capsule by mouth daily.       . simvastatin (ZOCOR) 5 MG tablet Take 1 tablet (5 mg total) by mouth at bedtime.  30 tablet  1  . traMADol (ULTRAM) 50 MG tablet Take 1 tablet (50 mg total) by mouth every 6 (six) hours as needed.  30 tablet  0  . vitamin E 400 UNIT capsule Take 400 Units by mouth daily.      [name] warfarin (COUMADIN) 2 MG tablet Take 1 tablet (2 mg total) by mouth daily at 6 PM. Or as directed by Dr. Anne Fu  30 tablet  1      Physical Exam:   BP 130/74  Pulse 95  Resp 18  Ht 5\' 5"  (1.651 m)  Wt 95 lb (43.092 kg)  BMI 15.81 kg/m2  SpO2 98%  General:  Well-appearing  Chest:   Clear to auscultation with symmetrical breath sounds  CV:   Regular rate and rhythm without murmur  Incisions:  Clean and dry and healing very nicely, the sternum is stable  on palpation  Abdomen:  Soft and nontender  Extremities:  Warm and well-perfused with no lower extremity edema  Diagnostic Tests:  *RADIOLOGY REPORT*   Clinical Data: 2 months post CABG and MVR   CHEST - 2 VIEW   Comparison: 10/19/2011   Findings: Enlargement of cardiac silhouette post CABG and MVR. Calcified tortuous aorta. Pulmonary vascularity normal. Moderate sized hiatal hernia. Changes of COPD with right apex scarring. Minimal left pleural effusion. No acute infiltrate or pneumothorax. Diffuse osseous demineralization with biconvex thoracolumbar scoliosis, thoracic kyphosis and chronic mid thoracic compression fractures.   IMPRESSION: Enlargement of cardiac silhouette post CABG and MVR. COPD with normal pleural effusion.     Original Report Authenticated By: Ulyses Southward, M.D.    Impression:  The patient is doing exceptionally well just one month status post mitral valve repair, coronary artery bypass grafting, closure of patent foramen ovale, and  resection of small benign appearing left ventricular outflow tract mass.  Plan:  I've instructed the patient to increase her dose of metoprolol 25 mg by mouth 3 times daily since both her blood pressure and heart rate have been running somewhat high. I've encouraged patient to get started in the cardiac rehabilitation program. I've reminded her to avoid any sort of heavy lifting or strenuous use of her arms or shoulders, but sure that I've encouraged her to start doing more and more of her preoperative physical routine and daily chores. All of her questions been addressed. We'll plan to see her back in 2 months.   Salvatore Decent. Cornelius Moras, MD 11/15/2011 4:08 PM

## 2011-11-15 NOTE — Patient Instructions (Signed)
Increase metoprolol to 25 mg by mouth three times daily.  Begin cardiac rehab.  Increase ambulation as tolerated. The patient has been reminded to continue to avoid any heavy lifting or strenuous use of arms or shoulders for at least a total of three months from the time of surgery.

## 2011-11-25 ENCOUNTER — Encounter (HOSPITAL_COMMUNITY)
Admission: RE | Admit: 2011-11-25 | Discharge: 2011-11-25 | Disposition: A | Discharge status: Home / Self Care | Payer: Medicare Other | Source: Ambulatory Visit | Attending: Cardiovascular Disease | Admitting: Cardiovascular Disease

## 2011-11-25 DIAGNOSIS — I10 Essential (primary) hypertension: Secondary | ICD-10-CM | POA: Diagnosis not present

## 2011-11-25 DIAGNOSIS — I2510 Atherosclerotic heart disease of native coronary artery without angina pectoris: Secondary | ICD-10-CM | POA: Diagnosis not present

## 2011-11-25 DIAGNOSIS — I4891 Unspecified atrial fibrillation: Secondary | ICD-10-CM | POA: Diagnosis not present

## 2011-11-25 DIAGNOSIS — Z48812 Encounter for surgical aftercare following surgery on the circulatory system: Secondary | ICD-10-CM | POA: Diagnosis not present

## 2011-11-25 DIAGNOSIS — I509 Heart failure, unspecified: Secondary | ICD-10-CM | POA: Diagnosis not present

## 2011-11-25 DIAGNOSIS — G8918 Other acute postprocedural pain: Secondary | ICD-10-CM | POA: Diagnosis not present

## 2011-11-25 NOTE — Progress Notes (Signed)
Cardiac Rehab Medication Review by a Pharmacist  Does the patient  feel that his/her medications are working for him/her?  Yes with the exception of the metoprolol. Patient's husband notes that the HR is still elevated despite increasing the dose from 25 to 75 mg.  Has the patient been experiencing any side effects to the medications prescribed?  no  Does the patient measure his/her own blood pressure or blood glucose at home?  Yes, husband checks blood pressure  Does the patient have any problems obtaining medications due to transportation or finances?   no  Understanding of regimen: good Understanding of indications: good Potential of compliance: excellent    Pharmacist comments: Patient arrives today with her husband, who handles her medications.  They seem to have a good understanding of her medications and their uses. Patient instructed to call their doctor regarding metoprolol (in terms of long-acting vs. Short acting and once a day vs. Multiple day dosing).    Lillia Pauls, PharmD Clinical Pharmacist Pager: [phone] Phone: [phone] 11/25/2011 8:34 AM

## 2011-11-29 DIAGNOSIS — I2510 Atherosclerotic heart disease of native coronary artery without angina pectoris: Secondary | ICD-10-CM | POA: Diagnosis not present

## 2011-11-29 DIAGNOSIS — Z7901 Long term (current) use of anticoagulants: Secondary | ICD-10-CM | POA: Diagnosis not present

## 2011-11-29 DIAGNOSIS — I10 Essential (primary) hypertension: Secondary | ICD-10-CM | POA: Diagnosis not present

## 2011-11-29 DIAGNOSIS — I059 Rheumatic mitral valve disease, unspecified: Secondary | ICD-10-CM | POA: Diagnosis not present

## 2011-12-01 ENCOUNTER — Encounter (HOSPITAL_COMMUNITY): Admission: RE | Admit: 2011-12-01 | Payer: Medicare Other | Source: Ambulatory Visit

## 2011-12-01 DIAGNOSIS — Z79899 Other long term (current) drug therapy: Secondary | ICD-10-CM | POA: Diagnosis not present

## 2011-12-06 ENCOUNTER — Encounter (HOSPITAL_COMMUNITY)
Admission: RE | Admit: 2011-12-06 | Discharge: 2011-12-06 | Disposition: A | Discharge status: Home / Self Care | Payer: Medicare Other | Source: Ambulatory Visit | Attending: Cardiology | Admitting: Cardiology

## 2011-12-06 DIAGNOSIS — I059 Rheumatic mitral valve disease, unspecified: Secondary | ICD-10-CM | POA: Insufficient documentation

## 2011-12-06 DIAGNOSIS — I2510 Atherosclerotic heart disease of native coronary artery without angina pectoris: Secondary | ICD-10-CM | POA: Insufficient documentation

## 2011-12-06 DIAGNOSIS — Z5189 Encounter for other specified aftercare: Secondary | ICD-10-CM | POA: Diagnosis not present

## 2011-12-06 DIAGNOSIS — Z951 Presence of aortocoronary bypass graft: Secondary | ICD-10-CM | POA: Diagnosis not present

## 2011-12-06 DIAGNOSIS — Z954 Presence of other heart-valve replacement: Secondary | ICD-10-CM | POA: Insufficient documentation

## 2011-12-07 NOTE — Progress Notes (Signed)
Pt started cardiac rehab today.  Pt tolerated light exercise without difficulty. Telemetry Sinus rhythm T wave inversion with intermittent PVC's.  Patient has a history of PVC's. Patient asymptomatic.  Vital signs stable. Will continue to monitor the patient throughout  the program. Will continue to monitor the patient throughout  the program.  Will fax exercise flow sheets to Dr. Judd Gaudier office for review with today's ECG tracings.

## 2011-12-08 ENCOUNTER — Encounter (HOSPITAL_COMMUNITY)
Admission: RE | Admit: 2011-12-08 | Discharge: 2011-12-08 | Disposition: A | Discharge status: Home / Self Care | Payer: Medicare Other | Source: Ambulatory Visit | Attending: Cardiology | Admitting: Cardiology

## 2011-12-09 DIAGNOSIS — Z7901 Long term (current) use of anticoagulants: Secondary | ICD-10-CM | POA: Diagnosis not present

## 2011-12-10 ENCOUNTER — Encounter (HOSPITAL_COMMUNITY)
Admission: RE | Admit: 2011-12-10 | Discharge: 2011-12-10 | Disposition: A | Discharge status: Home / Self Care | Payer: Medicare Other | Source: Ambulatory Visit | Attending: Cardiology | Admitting: Cardiology

## 2011-12-13 ENCOUNTER — Encounter (HOSPITAL_COMMUNITY)
Admission: RE | Admit: 2011-12-13 | Discharge: 2011-12-13 | Disposition: A | Discharge status: Home / Self Care | Payer: Medicare Other | Source: Ambulatory Visit | Attending: Cardiology | Admitting: Cardiology

## 2011-12-15 ENCOUNTER — Encounter (HOSPITAL_COMMUNITY)
Admission: RE | Admit: 2011-12-15 | Discharge: 2011-12-15 | Disposition: A | Discharge status: Home / Self Care | Payer: Medicare Other | Source: Ambulatory Visit | Attending: Cardiology | Admitting: Cardiology

## 2011-12-15 NOTE — Progress Notes (Signed)
Reviewed home exercise with pt today.  Pt plans to walk at home for exercise.  Reviewed THR, pulse (needs to continue to practice), RPE, sign and symptoms, and when to call 911 or MD.  Pt voiced understanding. Fabio Pierce, MA, ACSM RCEP

## 2011-12-17 ENCOUNTER — Encounter (HOSPITAL_COMMUNITY)
Admission: RE | Admit: 2011-12-17 | Discharge: 2011-12-17 | Disposition: A | Discharge status: Home / Self Care | Payer: Medicare Other | Source: Ambulatory Visit | Attending: Cardiology | Admitting: Cardiology

## 2011-12-20 ENCOUNTER — Encounter (HOSPITAL_COMMUNITY)
Admission: RE | Admit: 2011-12-20 | Discharge: 2011-12-20 | Disposition: A | Discharge status: Home / Self Care | Payer: Medicare Other | Source: Ambulatory Visit | Attending: Cardiology | Admitting: Cardiology

## 2011-12-20 NOTE — Progress Notes (Signed)
Prue's heart rate was noted at 131 during exercise today on the airdyne today at exercise at cardiac rehab.  Patient switched to track.  Rhythm Sinus rhythm Sinus tach. Blood pressure stable.  Entry heart rate 94.  Exit heart rate 97. Will fax exercise flow sheets to Dr. Judd Gaudier office for review.

## 2011-12-22 ENCOUNTER — Encounter (HOSPITAL_COMMUNITY)
Admission: RE | Admit: 2011-12-22 | Discharge: 2011-12-22 | Disposition: A | Discharge status: Home / Self Care | Payer: Medicare Other | Source: Ambulatory Visit | Attending: Cardiology | Admitting: Cardiology

## 2011-12-23 DIAGNOSIS — Z7901 Long term (current) use of anticoagulants: Secondary | ICD-10-CM | POA: Diagnosis not present

## 2011-12-24 ENCOUNTER — Encounter (HOSPITAL_COMMUNITY)
Admission: RE | Admit: 2011-12-24 | Discharge: 2011-12-24 | Disposition: A | Discharge status: Home / Self Care | Payer: Medicare Other | Source: Ambulatory Visit | Attending: Cardiology | Admitting: Cardiology

## 2011-12-27 ENCOUNTER — Encounter (HOSPITAL_COMMUNITY)
Admission: RE | Admit: 2011-12-27 | Discharge: 2011-12-27 | Disposition: A | Discharge status: Home / Self Care | Payer: Medicare Other | Source: Ambulatory Visit | Attending: Cardiology | Admitting: Cardiology

## 2011-12-31 ENCOUNTER — Encounter (HOSPITAL_COMMUNITY)
Admission: RE | Admit: 2011-12-31 | Discharge: 2011-12-31 | Disposition: A | Discharge status: Home / Self Care | Payer: Medicare Other | Source: Ambulatory Visit | Attending: Cardiology | Admitting: Cardiology

## 2012-01-03 ENCOUNTER — Encounter (HOSPITAL_COMMUNITY)
Admission: RE | Admit: 2012-01-03 | Discharge: 2012-01-03 | Disposition: A | Discharge status: Home / Self Care | Payer: Medicare Other | Source: Ambulatory Visit | Attending: Cardiology | Admitting: Cardiology

## 2012-01-07 ENCOUNTER — Encounter (HOSPITAL_COMMUNITY)
Admission: RE | Admit: 2012-01-07 | Discharge: 2012-01-07 | Disposition: A | Discharge status: Home / Self Care | Payer: Medicare Other | Source: Ambulatory Visit | Attending: Cardiology | Admitting: Cardiology

## 2012-01-07 DIAGNOSIS — I2510 Atherosclerotic heart disease of native coronary artery without angina pectoris: Secondary | ICD-10-CM | POA: Insufficient documentation

## 2012-01-07 DIAGNOSIS — Z954 Presence of other heart-valve replacement: Secondary | ICD-10-CM | POA: Insufficient documentation

## 2012-01-07 DIAGNOSIS — Z951 Presence of aortocoronary bypass graft: Secondary | ICD-10-CM | POA: Diagnosis not present

## 2012-01-07 DIAGNOSIS — I059 Rheumatic mitral valve disease, unspecified: Secondary | ICD-10-CM | POA: Insufficient documentation

## 2012-01-07 DIAGNOSIS — Z5189 Encounter for other specified aftercare: Secondary | ICD-10-CM | POA: Insufficient documentation

## 2012-01-10 ENCOUNTER — Encounter (HOSPITAL_COMMUNITY)
Admission: RE | Admit: 2012-01-10 | Discharge: 2012-01-10 | Disposition: A | Discharge status: Home / Self Care | Payer: Medicare Other | Source: Ambulatory Visit | Attending: Cardiology | Admitting: Cardiology

## 2012-01-12 ENCOUNTER — Encounter (HOSPITAL_COMMUNITY)
Admission: RE | Admit: 2012-01-12 | Discharge: 2012-01-12 | Disposition: A | Discharge status: Home / Self Care | Payer: Medicare Other | Source: Ambulatory Visit | Attending: Cardiology | Admitting: Cardiology

## 2012-01-13 DIAGNOSIS — Z7901 Long term (current) use of anticoagulants: Secondary | ICD-10-CM | POA: Diagnosis not present

## 2012-01-13 DIAGNOSIS — Z9889 Other specified postprocedural states: Secondary | ICD-10-CM | POA: Diagnosis not present

## 2012-01-14 ENCOUNTER — Encounter (HOSPITAL_COMMUNITY)
Admission: RE | Admit: 2012-01-14 | Discharge: 2012-01-14 | Disposition: A | Discharge status: Home / Self Care | Payer: Medicare Other | Source: Ambulatory Visit | Attending: Cardiology | Admitting: Cardiology

## 2012-01-17 ENCOUNTER — Ambulatory Visit (INDEPENDENT_AMBULATORY_CARE_PROVIDER_SITE_OTHER): Payer: Self-pay | Admitting: Thoracic Surgery (Cardiothoracic Vascular Surgery)

## 2012-01-17 ENCOUNTER — Encounter (HOSPITAL_COMMUNITY): Payer: Medicare Other

## 2012-01-17 ENCOUNTER — Encounter: Payer: Self-pay | Admitting: Thoracic Surgery (Cardiothoracic Vascular Surgery)

## 2012-01-17 VITALS — BP 135/72 | HR 91 | Resp 16 | Ht 64.0 in | Wt 95.0 lb

## 2012-01-17 DIAGNOSIS — Z9889 Other specified postprocedural states: Secondary | ICD-10-CM

## 2012-01-17 DIAGNOSIS — Z09 Encounter for follow-up examination after completed treatment for conditions other than malignant neoplasm: Secondary | ICD-10-CM

## 2012-01-17 DIAGNOSIS — I340 Nonrheumatic mitral (valve) insufficiency: Secondary | ICD-10-CM

## 2012-01-17 DIAGNOSIS — Q211 Atrial septal defect: Secondary | ICD-10-CM

## 2012-01-17 DIAGNOSIS — D151 Benign neoplasm of heart: Secondary | ICD-10-CM

## 2012-01-17 DIAGNOSIS — Z951 Presence of aortocoronary bypass graft: Secondary | ICD-10-CM

## 2012-01-17 DIAGNOSIS — I2510 Atherosclerotic heart disease of native coronary artery without angina pectoris: Secondary | ICD-10-CM

## 2012-01-17 DIAGNOSIS — I059 Rheumatic mitral valve disease, unspecified: Secondary | ICD-10-CM

## 2012-01-17 NOTE — Patient Instructions (Signed)

## 2012-01-17 NOTE — Progress Notes (Addendum)
[address]            [place] [address]          [phone]     CARDIOTHORACIC SURGERY OFFICE NOTE  Referring Provider is Donato Schultz, MD PCP is Cala Bradford, MD   HPI:  Patient returns for followup status post mitral valve repair, coronary artery bypass grafting, closure of patent foramen ovale, and resection of small mass in the left ventricular output tract on 10/22/2011.  She was last seen here in our office on 11/15/2011. She has done remarkably well. She reports that she is now back to essentially normal physical activity. She has gained 4 pounds in weight but she still remains several pounds below her baseline. She has no shortness of breath. She has no chest pain. She has not had any palpitations or dizzy spells. She is progressing gradually with the cardiac rehabilitation program. She has not had any problems with Coumadin therapy.   Current Outpatient Prescriptions  Medication Sig Dispense Refill  . aspirin 81 MG tablet Take 81 mg by mouth daily.       [name] co-enzyme Q-10 30 MG capsule Take 30 mg by mouth daily.       [name] guaiFENesin (MUCINEX) 600 MG 12 hr tablet Take 1 tablet (600 mg total) by mouth 2 (two) times daily as needed for congestion.      [name] KRILL OIL PO Take 300 mg by mouth.      [name] lisinopril (PRINIVIL,ZESTRIL) 5 MG tablet Take 1 tablet (5 mg total) by mouth daily.  30 tablet  1  . metoprolol succinate (TOPROL-XL) 50 MG 24 hr tablet Take 50 mg by mouth 2 (two) times daily before a meal. Take with or immediately following a meal.      . multivitamin-lutein (OCUVITE-LUTEIN) CAPS Take 1 capsule by mouth daily.       . simvastatin (ZOCOR) 5 MG tablet Take 1 tablet (5 mg total) by mouth at bedtime.  30 tablet  1  . vitamin E 400 UNIT capsule Take 400 Units by mouth daily.      [name] warfarin (COUMADIN) 2 MG tablet Take 1 tablet (2 mg total) by mouth daily at 6 PM. Or as directed by Dr. Anne Fu  30 tablet  1      Physical Exam:   BP  135/72  Pulse 91  Resp 16  Ht 5\' 4"  (1.626 m)  Wt 95 lb (43.092 kg)  BMI 16.31 kg/m2  SpO2 97%  General:  Well-appearing  Chest:   Clear to auscultation with symmetrical breath sounds  CV:   Regular rate and rhythm without murmur  Incisions:  Completely healed  Abdomen:  Soft and nontender  Extremities:  Warm and well-perfused with no lower extremity edema  Diagnostic Tests:  n/a   Impression:  Patient is doing remarkably well nearly 3 months status post mitral valve repair and coronary artery bypass grafting.  Plan:  I've encouraged patient to continue to increase her physical activity without any particular limitations at this time. She is scheduled to see Dr. Anne Fu for followup later this month. I think it would be reasonable for her to stop taking Coumadin as long as her followup 12-lead electrocardiogram and echocardiograms both look good.  She has been reminded regarding the need for long-term endocarditis prophylaxis. All questions of both she and her husband have been answered. In  the future she will call and return to see [place] as needed.   Salvatore Decent. Cornelius Moras, MD 01/17/2012 3:34 PM     Addendum to previously dictated note:  Report of transthoracic echocardiogram performed 10/28/2011 at [facility] is reviewed. The patient's mitral valve repair looks good with intact annuloplasty ring and trivial mitral regurgitation. Left ventricular ejection fraction is estimated 50-55%. There is severe asymmetric septal hypertrophy. There were no regional wall motion abnormalities the left ventricle. There was mild tricuspid regurgitation. No other significant abnormalities were noted.

## 2012-01-19 ENCOUNTER — Encounter (HOSPITAL_COMMUNITY)
Admission: RE | Admit: 2012-01-19 | Discharge: 2012-01-19 | Disposition: A | Discharge status: Home / Self Care | Payer: Medicare Other | Source: Ambulatory Visit | Attending: Cardiology | Admitting: Cardiology

## 2012-01-21 ENCOUNTER — Encounter (HOSPITAL_COMMUNITY)
Admission: RE | Admit: 2012-01-21 | Discharge: 2012-01-21 | Disposition: A | Discharge status: Home / Self Care | Payer: Medicare Other | Source: Ambulatory Visit | Attending: Cardiology | Admitting: Cardiology

## 2012-01-24 ENCOUNTER — Encounter (HOSPITAL_COMMUNITY)
Admission: RE | Admit: 2012-01-24 | Discharge: 2012-01-24 | Disposition: A | Discharge status: Home / Self Care | Payer: Medicare Other | Source: Ambulatory Visit | Attending: Cardiology | Admitting: Cardiology

## 2012-01-26 ENCOUNTER — Encounter (HOSPITAL_COMMUNITY): Payer: Medicare Other

## 2012-01-26 DIAGNOSIS — Z7901 Long term (current) use of anticoagulants: Secondary | ICD-10-CM | POA: Diagnosis not present

## 2012-01-26 DIAGNOSIS — I10 Essential (primary) hypertension: Secondary | ICD-10-CM | POA: Diagnosis not present

## 2012-01-26 DIAGNOSIS — I2510 Atherosclerotic heart disease of native coronary artery without angina pectoris: Secondary | ICD-10-CM | POA: Diagnosis not present

## 2012-01-26 DIAGNOSIS — Z9889 Other specified postprocedural states: Secondary | ICD-10-CM | POA: Diagnosis not present

## 2012-01-26 DIAGNOSIS — I059 Rheumatic mitral valve disease, unspecified: Secondary | ICD-10-CM | POA: Diagnosis not present

## 2012-01-27 DIAGNOSIS — I10 Essential (primary) hypertension: Secondary | ICD-10-CM | POA: Diagnosis not present

## 2012-01-27 DIAGNOSIS — I059 Rheumatic mitral valve disease, unspecified: Secondary | ICD-10-CM | POA: Diagnosis not present

## 2012-01-27 DIAGNOSIS — I2510 Atherosclerotic heart disease of native coronary artery without angina pectoris: Secondary | ICD-10-CM | POA: Diagnosis not present

## 2012-01-28 ENCOUNTER — Encounter (HOSPITAL_COMMUNITY)
Admission: RE | Admit: 2012-01-28 | Discharge: 2012-01-28 | Disposition: A | Discharge status: Home / Self Care | Payer: Medicare Other | Source: Ambulatory Visit | Attending: Cardiology | Admitting: Cardiology

## 2012-01-31 ENCOUNTER — Encounter (HOSPITAL_COMMUNITY)
Admission: RE | Admit: 2012-01-31 | Discharge: 2012-01-31 | Disposition: A | Discharge status: Home / Self Care | Payer: Medicare Other | Source: Ambulatory Visit | Attending: Cardiology | Admitting: Cardiology

## 2012-02-02 ENCOUNTER — Encounter (HOSPITAL_COMMUNITY)
Admission: RE | Admit: 2012-02-02 | Discharge: 2012-02-02 | Disposition: A | Discharge status: Home / Self Care | Payer: Medicare Other | Source: Ambulatory Visit | Attending: Cardiology | Admitting: Cardiology

## 2012-02-04 ENCOUNTER — Encounter (HOSPITAL_COMMUNITY)
Admission: RE | Admit: 2012-02-04 | Discharge: 2012-02-04 | Disposition: A | Discharge status: Home / Self Care | Payer: Medicare Other | Source: Ambulatory Visit | Attending: Cardiology | Admitting: Cardiology

## 2012-02-07 ENCOUNTER — Encounter (HOSPITAL_COMMUNITY)
Admission: RE | Admit: 2012-02-07 | Discharge: 2012-02-07 | Disposition: A | Discharge status: Home / Self Care | Payer: Medicare Other | Source: Ambulatory Visit | Attending: Cardiology | Admitting: Cardiology

## 2012-02-07 DIAGNOSIS — I2510 Atherosclerotic heart disease of native coronary artery without angina pectoris: Secondary | ICD-10-CM | POA: Diagnosis not present

## 2012-02-07 DIAGNOSIS — I059 Rheumatic mitral valve disease, unspecified: Secondary | ICD-10-CM | POA: Diagnosis not present

## 2012-02-07 DIAGNOSIS — Z954 Presence of other heart-valve replacement: Secondary | ICD-10-CM | POA: Diagnosis not present

## 2012-02-07 DIAGNOSIS — Z951 Presence of aortocoronary bypass graft: Secondary | ICD-10-CM | POA: Insufficient documentation

## 2012-02-07 DIAGNOSIS — Z5189 Encounter for other specified aftercare: Secondary | ICD-10-CM | POA: Diagnosis not present

## 2012-02-09 ENCOUNTER — Encounter (HOSPITAL_COMMUNITY)
Admission: RE | Admit: 2012-02-09 | Discharge: 2012-02-09 | Disposition: A | Discharge status: Home / Self Care | Payer: Medicare Other | Source: Ambulatory Visit | Attending: Cardiology | Admitting: Cardiology

## 2012-02-10 DIAGNOSIS — E785 Hyperlipidemia, unspecified: Secondary | ICD-10-CM | POA: Diagnosis not present

## 2012-02-10 DIAGNOSIS — I10 Essential (primary) hypertension: Secondary | ICD-10-CM | POA: Diagnosis not present

## 2012-02-10 DIAGNOSIS — M810 Age-related osteoporosis without current pathological fracture: Secondary | ICD-10-CM | POA: Diagnosis not present

## 2012-02-11 ENCOUNTER — Encounter (HOSPITAL_COMMUNITY)
Admission: RE | Admit: 2012-02-11 | Discharge: 2012-02-11 | Disposition: A | Discharge status: Home / Self Care | Payer: Medicare Other | Source: Ambulatory Visit | Attending: Cardiology | Admitting: Cardiology

## 2012-02-14 ENCOUNTER — Encounter (HOSPITAL_COMMUNITY)
Admission: RE | Admit: 2012-02-14 | Discharge: 2012-02-14 | Disposition: A | Discharge status: Home / Self Care | Payer: Medicare Other | Source: Ambulatory Visit | Attending: Cardiology | Admitting: Cardiology

## 2012-02-14 NOTE — Progress Notes (Signed)
Nutrition Note Spoke with pt.  Pt is underweight, BMI 17.8. Pt reports UBW 100 lbs. Per pt, her lowest wt was 95 lbs. Per pt's husband, pt got down to 88 lbs. Pt's husband asked this writer to tell the pt that "she can't live on 700-800 kcal a day." Pt states she has been trying to gain wt by drinking Ensure. Pt reports appetite and taste sensation have decreased since her surgery. Pt wt has stabilized over the past 3 months around 95 lbs. Pt educated re: high calorie, high protein diet to help promote wt gain. Nutrition Plan and Nutrition Survey goals reviewed with pt. Pt is following Step 2 of the Therapeutic Lifestyle Changes diet. Pt encouraged to liberalize her diet to help with wt gain. Pt expressed understanding of the information reviewed. Pt aware of nutrition education classes offered and is unable to attend nutrition classes.  Wt Readings from Last 3 Encounters:  01/17/12 95 lb (43.092 kg)  11/25/11 95 lb 14.4 oz (43.5 kg)  11/15/11 95 lb (43.092 kg)   Nutrition Diagnosis   Food-and nutrition-related knowledge deficit related to lack of exposure to information as related to diagnosis of: ? CVD   Underweight related to excessive energy expenditure/ decreased energy intake as evidenced by a BMI of 17.8  Nutrition RX/ Estimated Daily Nutrition Needs for: wt gain 1800-2250 Kcal, 45-60 gm fat, 13-15 gm sat fat, 1.7-2.3 gm trans-fat, <1500 mg sodium  Nutrition Intervention   Pt's individual nutrition plan including cholesterol goals reviewed with pt.   Benefits of adopting Therapeutic Lifestyle Changes discussed when Medficts reviewed.   Pt to attend the Portion Distortion class - met 02/02/12   Pt to change from Ensure to Ensure Plus supplements   Pt given handouts for: ? Nutrition I class ? Nutrition II class ? High Calorie, High Protein diet and recipes    Continue client-centered nutrition education by RD, as part of interdisciplinary care. Goal(s)   Pt to identify food quantities  necessary to achieve: ? wt gain to a goal wt of 100-105 lb (45.5-47.7 kg) at graduation from cardiac rehab.  Monitor and Evaluate progress toward nutrition goal with team. Nutrition Risk: Change to Moderate

## 2012-02-16 ENCOUNTER — Encounter (HOSPITAL_COMMUNITY): Payer: Medicare Other

## 2012-02-18 ENCOUNTER — Encounter (HOSPITAL_COMMUNITY): Payer: Medicare Other

## 2012-02-21 ENCOUNTER — Encounter (HOSPITAL_COMMUNITY)
Admission: RE | Admit: 2012-02-21 | Discharge: 2012-02-21 | Disposition: A | Discharge status: Home / Self Care | Payer: Medicare Other | Source: Ambulatory Visit | Attending: Cardiology | Admitting: Cardiology

## 2012-02-23 ENCOUNTER — Encounter (HOSPITAL_COMMUNITY)
Admission: RE | Admit: 2012-02-23 | Discharge: 2012-02-23 | Disposition: A | Discharge status: Home / Self Care | Payer: Medicare Other | Source: Ambulatory Visit | Attending: Cardiology | Admitting: Cardiology

## 2012-02-25 ENCOUNTER — Encounter (HOSPITAL_COMMUNITY)
Admission: RE | Admit: 2012-02-25 | Discharge: 2012-02-25 | Disposition: A | Discharge status: Home / Self Care | Payer: Medicare Other | Source: Ambulatory Visit | Attending: Cardiology | Admitting: Cardiology

## 2012-02-28 ENCOUNTER — Encounter (HOSPITAL_COMMUNITY)
Admission: RE | Admit: 2012-02-28 | Discharge: 2012-02-28 | Disposition: A | Discharge status: Home / Self Care | Payer: Medicare Other | Source: Ambulatory Visit | Attending: Cardiology | Admitting: Cardiology

## 2012-03-01 ENCOUNTER — Encounter (HOSPITAL_COMMUNITY)
Admission: RE | Admit: 2012-03-01 | Discharge: 2012-03-01 | Disposition: A | Discharge status: Home / Self Care | Payer: Medicare Other | Source: Ambulatory Visit | Attending: Cardiology | Admitting: Cardiology

## 2012-03-03 ENCOUNTER — Encounter (HOSPITAL_COMMUNITY)
Admission: RE | Admit: 2012-03-03 | Discharge: 2012-03-03 | Disposition: A | Discharge status: Home / Self Care | Payer: Medicare Other | Source: Ambulatory Visit | Attending: Cardiology | Admitting: Cardiology

## 2012-03-06 ENCOUNTER — Encounter (HOSPITAL_COMMUNITY): Payer: Medicare Other

## 2012-03-08 ENCOUNTER — Encounter (HOSPITAL_COMMUNITY)
Admission: RE | Admit: 2012-03-08 | Discharge: 2012-03-08 | Disposition: A | Discharge status: Home / Self Care | Payer: Medicare Other | Source: Ambulatory Visit | Attending: Cardiology | Admitting: Cardiology

## 2012-03-08 DIAGNOSIS — Z954 Presence of other heart-valve replacement: Secondary | ICD-10-CM | POA: Diagnosis not present

## 2012-03-08 DIAGNOSIS — I059 Rheumatic mitral valve disease, unspecified: Secondary | ICD-10-CM | POA: Insufficient documentation

## 2012-03-08 DIAGNOSIS — I2510 Atherosclerotic heart disease of native coronary artery without angina pectoris: Secondary | ICD-10-CM | POA: Insufficient documentation

## 2012-03-08 DIAGNOSIS — Z5189 Encounter for other specified aftercare: Secondary | ICD-10-CM | POA: Insufficient documentation

## 2012-03-08 DIAGNOSIS — Z951 Presence of aortocoronary bypass graft: Secondary | ICD-10-CM | POA: Insufficient documentation

## 2012-03-10 ENCOUNTER — Encounter (HOSPITAL_COMMUNITY): Payer: Medicare Other

## 2012-03-13 ENCOUNTER — Encounter (HOSPITAL_COMMUNITY): Payer: Medicare Other

## 2012-03-15 ENCOUNTER — Encounter (HOSPITAL_COMMUNITY): Payer: Medicare Other

## 2012-03-17 ENCOUNTER — Encounter (HOSPITAL_COMMUNITY): Payer: Medicare Other

## 2012-03-20 ENCOUNTER — Encounter (HOSPITAL_COMMUNITY): Payer: Medicare Other

## 2012-03-22 ENCOUNTER — Encounter (HOSPITAL_COMMUNITY): Payer: Medicare Other

## 2012-03-24 ENCOUNTER — Encounter (HOSPITAL_COMMUNITY): Payer: Medicare Other

## 2012-03-27 ENCOUNTER — Encounter (HOSPITAL_COMMUNITY): Payer: Medicare Other

## 2012-03-29 ENCOUNTER — Encounter (HOSPITAL_COMMUNITY): Payer: Medicare Other

## 2012-03-31 ENCOUNTER — Encounter (HOSPITAL_COMMUNITY): Payer: Medicare Other

## 2012-04-03 DIAGNOSIS — M810 Age-related osteoporosis without current pathological fracture: Secondary | ICD-10-CM | POA: Diagnosis not present

## 2012-04-25 DIAGNOSIS — Z9889 Other specified postprocedural states: Secondary | ICD-10-CM | POA: Diagnosis not present

## 2012-04-25 DIAGNOSIS — I2510 Atherosclerotic heart disease of native coronary artery without angina pectoris: Secondary | ICD-10-CM | POA: Diagnosis not present

## 2012-04-25 DIAGNOSIS — I10 Essential (primary) hypertension: Secondary | ICD-10-CM | POA: Diagnosis not present

## 2012-05-08 ENCOUNTER — Encounter: Payer: Self-pay | Admitting: Thoracic Surgery (Cardiothoracic Vascular Surgery)

## 2012-05-08 ENCOUNTER — Ambulatory Visit: Payer: Medicare Other | Admitting: Thoracic Surgery (Cardiothoracic Vascular Surgery)

## 2012-05-08 ENCOUNTER — Ambulatory Visit (INDEPENDENT_AMBULATORY_CARE_PROVIDER_SITE_OTHER): Payer: Medicare Other | Admitting: Thoracic Surgery (Cardiothoracic Vascular Surgery)

## 2012-05-08 VITALS — BP 144/77 | HR 79 | Resp 16 | Ht 63.0 in | Wt 99.0 lb

## 2012-05-08 DIAGNOSIS — I2510 Atherosclerotic heart disease of native coronary artery without angina pectoris: Secondary | ICD-10-CM | POA: Diagnosis not present

## 2012-05-08 DIAGNOSIS — Z9889 Other specified postprocedural states: Secondary | ICD-10-CM | POA: Diagnosis not present

## 2012-05-08 DIAGNOSIS — I340 Nonrheumatic mitral (valve) insufficiency: Secondary | ICD-10-CM

## 2012-05-08 DIAGNOSIS — I059 Rheumatic mitral valve disease, unspecified: Secondary | ICD-10-CM | POA: Diagnosis not present

## 2012-05-08 DIAGNOSIS — Z951 Presence of aortocoronary bypass graft: Secondary | ICD-10-CM

## 2012-05-08 DIAGNOSIS — D4989 Neoplasm of unspecified behavior of other specified sites: Secondary | ICD-10-CM

## 2012-05-08 DIAGNOSIS — Z09 Encounter for follow-up examination after completed treatment for conditions other than malignant neoplasm: Secondary | ICD-10-CM

## 2012-05-08 NOTE — Progress Notes (Signed)
                   [address]            [place] [address]          [phone]     CARDIOTHORACIC SURGERY OFFICE NOTE  Referring Provider is Donato Schultz, MD PCP is Cala Bradford, MD   HPI:  Patient returns for followup status post mitral valve repair, coronary artery bypass grafting, closure of patent foramen ovale, and resection of small mass in the left ventricular output tract on 10/22/2011. She was last seen here in our office on 01/17/2012.  Since then she has continued to do exceptionally well. She has no chest pain and no shortness of breath. She reports that she is walking every day for at least an hour duration. She continues to followup with Dr. Anne Fu. Overall she has no complaints.    Current Outpatient Prescriptions  Medication Sig Dispense Refill  . aspirin 81 MG tablet Take 81 mg by mouth daily.       [name] co-enzyme Q-10 30 MG capsule Take 30 mg by mouth daily.       [name] guaiFENesin (MUCINEX) 600 MG 12 hr tablet Take 1 tablet (600 mg total) by mouth 2 (two) times daily as needed for congestion.      [name] KRILL OIL PO Take 300 mg by mouth.      [name] lisinopril (PRINIVIL,ZESTRIL) 5 MG tablet Take 1 tablet (5 mg total) by mouth daily.  30 tablet  1  . metoprolol succinate (TOPROL-XL) 50 MG 24 hr tablet Take 50 mg by mouth 2 (two) times daily before a meal. Take with or immediately following a meal.      . multivitamin-lutein (OCUVITE-LUTEIN) CAPS Take 1 capsule by mouth daily.       . simvastatin (ZOCOR) 5 MG tablet Take 1 tablet (5 mg total) by mouth at bedtime.  30 tablet  1  . vitamin E 400 UNIT capsule Take 400 Units by mouth daily.       No current facility-administered medications for this visit.      Physical Exam:   BP 144/77  Pulse 79  Resp 16  Ht 5\' 3"  (1.6 m)  Wt 99 lb (44.906 kg)  BMI 17.54 kg/m2  SpO2 99%  General:  Elderly but well-appearing  Chest:   Clear to auscultation  CV:   Regular rate and rhythm without  murmur  Incisions:  Completely healed  Abdomen:  Soft nontender  Extremities:  Warm and well-perfused  Diagnostic Tests:  n/a   Impression:  The patient is doing very well more than 6 months status post mitral valve repair, coronary artery bypass grafting, and closure of patent foramen ovale.  Plan:  In the future the patient will call and return to see [place] as needed. All of her questions been addressed.   Salvatore Decent. Cornelius Moras, MD 05/08/2012 1:45 PM

## 2012-06-08 ENCOUNTER — Other Ambulatory Visit: Payer: Self-pay | Admitting: Dermatology

## 2012-06-08 DIAGNOSIS — C44721 Squamous cell carcinoma of skin of unspecified lower limb, including hip: Secondary | ICD-10-CM | POA: Diagnosis not present

## 2012-06-08 DIAGNOSIS — D485 Neoplasm of uncertain behavior of skin: Secondary | ICD-10-CM | POA: Diagnosis not present

## 2012-06-08 DIAGNOSIS — D239 Other benign neoplasm of skin, unspecified: Secondary | ICD-10-CM | POA: Diagnosis not present

## 2012-06-08 DIAGNOSIS — Z85828 Personal history of other malignant neoplasm of skin: Secondary | ICD-10-CM | POA: Diagnosis not present

## 2012-06-08 DIAGNOSIS — L821 Other seborrheic keratosis: Secondary | ICD-10-CM | POA: Diagnosis not present

## 2012-07-11 DIAGNOSIS — L905 Scar conditions and fibrosis of skin: Secondary | ICD-10-CM | POA: Diagnosis not present

## 2012-07-11 DIAGNOSIS — C44721 Squamous cell carcinoma of skin of unspecified lower limb, including hip: Secondary | ICD-10-CM | POA: Diagnosis not present

## 2012-09-11 DIAGNOSIS — E785 Hyperlipidemia, unspecified: Secondary | ICD-10-CM | POA: Diagnosis not present

## 2012-09-11 DIAGNOSIS — Z23 Encounter for immunization: Secondary | ICD-10-CM | POA: Diagnosis not present

## 2012-09-11 DIAGNOSIS — Z79899 Other long term (current) drug therapy: Secondary | ICD-10-CM | POA: Diagnosis not present

## 2012-09-26 DIAGNOSIS — J069 Acute upper respiratory infection, unspecified: Secondary | ICD-10-CM | POA: Diagnosis not present

## 2012-10-24 ENCOUNTER — Encounter: Payer: Self-pay | Admitting: Cardiology

## 2012-10-25 ENCOUNTER — Encounter: Payer: Self-pay | Admitting: Cardiology

## 2012-10-25 ENCOUNTER — Ambulatory Visit (INDEPENDENT_AMBULATORY_CARE_PROVIDER_SITE_OTHER): Payer: Medicare Other | Admitting: Cardiology

## 2012-10-25 VITALS — BP 141/72 | HR 75 | Ht 62.0 in | Wt 98.0 lb

## 2012-10-25 DIAGNOSIS — I700 Atherosclerosis of aorta: Secondary | ICD-10-CM

## 2012-10-25 DIAGNOSIS — Z9889 Other specified postprocedural states: Secondary | ICD-10-CM | POA: Diagnosis not present

## 2012-10-25 DIAGNOSIS — I10 Essential (primary) hypertension: Secondary | ICD-10-CM | POA: Diagnosis not present

## 2012-10-25 DIAGNOSIS — E441 Mild protein-calorie malnutrition: Secondary | ICD-10-CM | POA: Insufficient documentation

## 2012-10-25 DIAGNOSIS — I2510 Atherosclerotic heart disease of native coronary artery without angina pectoris: Secondary | ICD-10-CM

## 2012-10-25 DIAGNOSIS — Z951 Presence of aortocoronary bypass graft: Secondary | ICD-10-CM | POA: Diagnosis not present

## 2012-10-25 HISTORY — DX: Mild protein-calorie malnutrition: E44.1

## 2012-10-25 HISTORY — DX: Atherosclerosis of aorta: I70.0

## 2012-10-25 NOTE — Progress Notes (Signed)
[address], [address] [place], [place]  [address] Phone: [phone] Fax:  [phone]  Date:  10/25/2012   ID:  Victoria Sexton, Victoria Sexton 1927/03/29, MRN [id number]  PCP:  Cala Bradford, MD   History of Present Illness: Victoria Sexton is a 79 y.o. female female with coronary artery disease status post bypass surgery as well as mitral valve repair by Dr. Priscille Kluver on 10/12/11 with LIMA to LAD, SVG to RCA, SVG to obtuse marginal, with patent foramen ovale closure and a benign-appearing endocardial mass removal here for followup.  She has had aortic atherosclerosis seen previously on CT scan and is currently being treated with statin medications. LDL in the past has been 91.  She has battled with mild malnutrition and is currently taking Ensure supplements. She is always been quite thin. Her husband is a retired Midwife.  She's not having any significant symptoms. No fevers, no chest pain, no strokelike symptoms, no palpitations.   Wt Readings from Last 3 Encounters:  10/25/12 98 lb (44.453 kg)  05/08/12 99 lb (44.906 kg)  01/17/12 95 lb (43.092 kg)     Past Medical History  Diagnosis Date  . Hypertension   . MVP (mitral valve prolapse)   . Osteoporosis   . Inguinal hernia right  . Mitral regurgitation due to cusp prolapse   . Cardiac tumor, ventricular     LVOT  . PVC (premature ventricular contraction)   . Hiatal hernia   . Coronary artery disease 10/04/2011  . Congestive heart failure 10/04/2011  . Heart murmur   . Shortness of breath   . Anginal pain   . Hernia, hiatal     Left wears a hernia belt  . Skin cancer 2013    Face.  . S/P mitral valve repair 10/12/2011    Complex valvuloplasty including triangular resection of flail posterior leaflet with artificial Goretex neocord placement x2 and 26 mm Sorin Memo 3D ring annuloplasty  . S/P CABG x 3 10/12/2011    LIMA to LAD, SVG to OM, SVG to RCA, EVH via left thigh and leg  . Thoracic compression fracture   . Hx of CABG       -LIMA to LAD,SVG to RCA, SVG to OM, mitral valve repair, patent foramen ovale closure, benign-apearing mass removal.  . Aortic atherosclerosis 10/25/2012    Upper aortic atherosclerosis seen on CT scan 08/13/2002    Past Surgical History  Procedure Laterality Date  . Abdominal hysterectomy    . Leg fx repair      Right  . Hemorroidectomy    . Tee without cardioversion  11/13/2010    Procedure: TRANSESOPHAGEAL ECHOCARDIOGRAM (TEE);  Surgeon: Donato Schultz, MD;  Location: [facility] ENDOSCOPY;  Service: Cardiovascular;  Laterality: N/A;  . Skin cancer excision  2013    not melanoma  . Hemorrhoid surgery    . Mitral valve repair  10/12/2011    Procedure: MITRAL VALVE REPAIR (MVR);  Surgeon: Purcell Nails, MD;  Location: [facility] OR;  Service: Open Heart Surgery;  Laterality: N/A;  MITRAL VALVE REPAIR  . Coronary artery bypass graft  10/12/2011    Procedure: CORONARY ARTERY BYPASS GRAFTING (CABG);  Surgeon: Purcell Nails, MD;  Location: [facility] OR;  Service: Open Heart Surgery;  Laterality: N/A;  RESECTION OF LV MASS  . Patent foramen ovale closure  10/12/2011    Procedure: PATENT FORAMEN OVALE CLOSURE;  Surgeon: Purcell Nails, MD;  Location: [facility] OR;  Service: Open  Heart Surgery;  Laterality: N/A;    Current Outpatient Prescriptions  Medication Sig Dispense Refill  . aspirin 81 MG tablet Take 81 mg by mouth daily.       [name] co-enzyme Q-10 30 MG capsule Take 30 mg by mouth daily.       [name] lisinopril (PRINIVIL,ZESTRIL) 5 MG tablet Take 1 tablet (5 mg total) by mouth daily.  30 tablet  1  . metoprolol succinate (TOPROL-XL) 50 MG 24 hr tablet Take 50 mg by mouth 2 (two) times daily before a meal. Take with or immediately following a meal.      . multivitamin-lutein (OCUVITE-LUTEIN) CAPS Take 1 capsule by mouth daily.       [name] OVER THE COUNTER MEDICATION Take 1 tablet by mouth daily. Omega Red      . simvastatin (ZOCOR) 5 MG tablet Take 1 tablet (5 mg total) by mouth at bedtime.  30 tablet  1   No current  facility-administered medications for this visit.    Allergies:    Allergies  Allergen Reactions  . Hydrocodone-Acetaminophen Shortness Of Breath  . Penicillins Palpitations  . Cyclobenzaprine Other (See Comments)    Dry mouth  . Sulfa Antibiotics Rash    Social History:  The patient  reports that she quit smoking about 1 years ago. Her smoking use included Cigarettes. She has a 15 pack-year smoking history. She does not have any smokeless tobacco history on file. She reports that she does not drink alcohol or use illicit drugs.   ROS:  Please see the history of present illness.   Denies any syncope, bleeding, orthopnea, PND   All other systems reviewed and negative.   PHYSICAL EXAM: VS:  BP 141/72  Pulse 75  Ht 5\' 2"  (1.575 m)  Wt 98 lb (44.453 kg)  BMI 17.92 kg/m2 Thin, elderly, kyphotic, in no acute distress HEENT: normal Neck: no JVD Cardiac:  normal S1, S2; RRR; no murmur Lungs:  clear to auscultation bilaterally, no wheezing, rhonchi or rales Abd: soft, nontender, no hepatomegaly Ext: no edema Skin: warm and dry Neuro: no focal abnormalities noted  EKG:  Sinus rhythm, left atrial enlargement, old septal infarct pattern.     ASSESSMENT AND PLAN:  1. Coronary artery disease-stable, no exertional anginal symptoms. Post bypass. 2. Mitral valve repair, doing well, stable 3. Hypertension-good control. She is not on amlodipine. I'm fine with her not being on this medication. 4. Mild malnutrition-encourage ensure supplementation. BMI less than 20. 5. Aortic atherosclerosis-continue with secondary prevention, low-dose simvastatin. 6. We will see back in 6 months.  Signed, Donato Schultz, MD [facility]  10/25/2012 3:47 PM

## 2012-10-25 NOTE — Patient Instructions (Signed)
Your physician recommends that you continue on your current medications as directed. Please refer to the Current Medication list given to you today.  Your physician wants you to follow-up in: 6 months with Dr. Skains. You will receive a reminder letter in the mail two months in advance. If you don't receive a letter, please call our office to schedule the follow-up appointment.  

## 2012-12-12 ENCOUNTER — Other Ambulatory Visit: Payer: Self-pay | Admitting: Cardiology

## 2012-12-12 NOTE — Telephone Encounter (Signed)
Can you please verify with the eagle chart how patient is to be taking this? The pharmacy is requesting it differently from what the office visit has. Thanks, MI

## 2013-04-24 ENCOUNTER — Encounter: Payer: Self-pay | Admitting: Cardiology

## 2013-04-24 ENCOUNTER — Ambulatory Visit (INDEPENDENT_AMBULATORY_CARE_PROVIDER_SITE_OTHER): Payer: Medicare Other | Admitting: Cardiology

## 2013-04-24 VITALS — BP 140/60 | HR 70 | Ht 62.0 in

## 2013-04-24 DIAGNOSIS — Z951 Presence of aortocoronary bypass graft: Secondary | ICD-10-CM

## 2013-04-24 DIAGNOSIS — I700 Atherosclerosis of aorta: Secondary | ICD-10-CM | POA: Diagnosis not present

## 2013-04-24 DIAGNOSIS — I2510 Atherosclerotic heart disease of native coronary artery without angina pectoris: Secondary | ICD-10-CM | POA: Diagnosis not present

## 2013-04-24 DIAGNOSIS — Z9889 Other specified postprocedural states: Secondary | ICD-10-CM

## 2013-04-24 NOTE — Progress Notes (Signed)
[name] [place], [place]  [address] Phone: [phone] Fax:  [phone]  Date:  04/24/2013   ID:  Victoria Sexton, [name] 1927/09/11, MRN [id number]  PCP:  Vidal Schwalbe, MD   History of Present Illness: Victoria Sexton is a 80 y.o. female female with coronary artery disease status post bypass surgery as well as mitral valve repair by Dr. Roxy Manns on 10/12/11 with LIMA to LAD, SVG to RCA, SVG to obtuse marginal, with patent foramen ovale closure and a benign-appearing endocardial mass removal here for followup.  She has had aortic atherosclerosis seen previously on CT scan and is currently being treated with statin medications. LDL in the past has been 91.  She has battled with mild malnutrition and is currently taking Ensure supplements. She has always been quite thin. Her husband is a retired Designer, multimedia.  She's not having any significant symptoms. No fevers, no chest pain, no strokelike symptoms, no palpitations.   Wt Readings from Last 3 Encounters:  10/25/12 98 lb (44.453 kg)  05/08/12 99 lb (44.906 kg)  01/17/12 95 lb (43.092 kg)     Past Medical History  Diagnosis Date  . Hypertension   . MVP (mitral valve prolapse)   . Osteoporosis   . Inguinal hernia right  . Mitral regurgitation due to cusp prolapse   . Cardiac tumor, ventricular     LVOT  . PVC (premature ventricular contraction)   . Hiatal hernia   . Coronary artery disease 10/04/2011  . Congestive heart failure 10/04/2011  . Heart murmur   . Shortness of breath   . Anginal pain   . Hernia, hiatal     Left wears a hernia belt  . Skin cancer 2013    Face.  . S/P mitral valve repair 10/12/2011    Complex valvuloplasty including triangular resection of flail posterior leaflet with artificial Goretex neocord placement x2 and 26 mm Sorin Memo 3D ring annuloplasty  . S/P CABG x 3 10/12/2011    LIMA to LAD, SVG to OM, SVG to RCA, EVH via left thigh and leg  . Thoracic compression fracture   . Hx of CABG      -LIMA to LAD,SVG to RCA, SVG to OM, mitral valve repair, patent foramen ovale closure, benign-apearing mass removal.  . Aortic atherosclerosis 10/25/2012    Upper aortic atherosclerosis seen on CT scan 08/13/2002  . Mild malnutrition 10/25/2012    Past Surgical History  Procedure Laterality Date  . Abdominal hysterectomy    . Leg fx repair      Right  . Hemorroidectomy    . Tee without cardioversion  11/13/2010    Procedure: TRANSESOPHAGEAL ECHOCARDIOGRAM (TEE);  Surgeon: Candee Furbish, MD;  Location: [facility] ENDOSCOPY;  Service: Cardiovascular;  Laterality: N/A;  . Skin cancer excision  2013    not melanoma  . Hemorrhoid surgery    . Mitral valve repair  10/12/2011    Procedure: MITRAL VALVE REPAIR (MVR);  Surgeon: Rexene Alberts, MD;  Location: [facility];  Service: Open Heart Surgery;  Laterality: N/A;  MITRAL VALVE REPAIR  . Coronary artery bypass graft  10/12/2011    Procedure: CORONARY ARTERY BYPASS GRAFTING (CABG);  Surgeon: Rexene Alberts, MD;  Location: [facility];  Service: Open Heart Surgery;  Laterality: N/A;  RESECTION OF LV MASS  . Patent foramen ovale closure  10/12/2011    Procedure: PATENT FORAMEN OVALE CLOSURE;  Surgeon: Rexene Alberts, MD;  Location: [facility]  OR;  Service: Open Heart Surgery;  Laterality: N/A;    Current Outpatient Prescriptions  Medication Sig Dispense Refill  . aspirin 81 MG tablet Take 81 mg by mouth daily.       [name] co-enzyme Q-10 30 MG capsule Take 30 mg by mouth daily.       [name] lisinopril (PRINIVIL,ZESTRIL) 5 MG tablet Take 1 tablet (5 mg total) by mouth daily.  30 tablet  1  . metoprolol succinate (TOPROL-XL) 100 MG 24 hr tablet Take 1 tablet (100 mg total) by mouth daily.  30 tablet  5  . metoprolol succinate (TOPROL-XL) 50 MG 24 hr tablet Take 50 mg by mouth 2 (two) times daily before a meal. Take with or immediately following a meal.      . multivitamin-lutein (OCUVITE-LUTEIN) CAPS Take 1 capsule by mouth daily.       [name] OVER THE COUNTER MEDICATION Take 1 tablet  by mouth daily. Omega Red      . simvastatin (ZOCOR) 5 MG tablet Take 1 tablet (5 mg total) by mouth at bedtime.  30 tablet  1   No current facility-administered medications for this visit.    Allergies:    Allergies  Allergen Reactions  . Hydrocodone-Acetaminophen Shortness Of Breath  . Penicillins Palpitations  . Cyclobenzaprine Other (See Comments)    Dry mouth  . Sulfa Antibiotics Rash    Social History:  The patient  reports that she quit smoking about 2 years ago. Her smoking use included Cigarettes. She has a 15 pack-year smoking history. She does not have any smokeless tobacco history on file. She reports that she does not drink alcohol or use illicit drugs.   ROS:  Please see the history of present illness.   Denies any syncope, bleeding, orthopnea, PND   All other systems reviewed and negative.   PHYSICAL EXAM: VS:  There were no vitals taken for this visit. Thin, elderly, kyphotic, in no acute distress HEENT: normal Neck: no JVD Cardiac:  normal S1, S2; RRR; no murmur Lungs:  clear to auscultation bilaterally, no wheezing, rhonchi or rales Abd: soft, nontender, no hepatomegaly Ext: no edema Skin: warm and dry Neuro: no focal abnormalities noted  EKG:  10/25/12: Sinus rhythm, left atrial enlargement, old septal infarct pattern.     ECHO: 10/18/11 (post repair of MV) - Left ventricle: The cavity size was normal. There was severe concentric hypertrophy. The estimated ejection fraction was 55%. Wall motion was normal; there were no regional wall motion abnormalities. There was an increased relative contribution of atrial contraction to ventricular filling. Doppler parameters are consistent with abnormal left ventricular relaxation (grade 1 diastolic dysfunction). - Aortic valve: Moderate thickening and calcification, consistent with sclerosis. - Mitral valve: By mean gradient there appears to be moderate mitral stenosis but MVA by pressure half-time appears  normal. Prior procedures included surgical repair. An annular ring prosthesis was present. Moderate thickening, with moderate involvement of chords. - Left atrium: The atrium was mild to moderately dilated.  ASSESSMENT AND PLAN:  1. Coronary artery disease-stable, no exertional anginal symptoms. Post bypass. 2. Mitral valve repair, doing well, stable 3. Hypertension-good control. She is not on amlodipine. I'm fine with her not being on this medication. 4. Mild malnutrition-encourage ensure supplementation. BMI less than 20. 5. Aortic atherosclerosis-continue with secondary prevention, low-dose simvastatin. 6. We will see back in 6 months.  Signed, Candee Furbish, MD [facility]  04/24/2013 4:12 PM

## 2013-04-24 NOTE — Patient Instructions (Signed)
Your physician recommends that you continue on your current medications as directed. Please refer to the Current Medication list given to you today.  Your physician wants you to follow-up in: 6 months follow-up Dr. Marlou Porch. You will receive a reminder letter in the mail two months in advance. If you don't receive a letter, please call our office to schedule the follow-up appointment.

## 2013-06-11 ENCOUNTER — Other Ambulatory Visit: Payer: Self-pay | Admitting: Cardiology

## 2013-06-18 ENCOUNTER — Other Ambulatory Visit: Payer: Self-pay | Admitting: Dermatology

## 2013-06-18 DIAGNOSIS — D0430 Carcinoma in situ of skin of unspecified part of face: Secondary | ICD-10-CM | POA: Diagnosis not present

## 2013-06-18 DIAGNOSIS — D239 Other benign neoplasm of skin, unspecified: Secondary | ICD-10-CM | POA: Diagnosis not present

## 2013-06-18 DIAGNOSIS — L570 Actinic keratosis: Secondary | ICD-10-CM | POA: Diagnosis not present

## 2013-06-18 DIAGNOSIS — D0439 Carcinoma in situ of skin of other parts of face: Secondary | ICD-10-CM | POA: Diagnosis not present

## 2013-06-18 DIAGNOSIS — D0470 Carcinoma in situ of skin of unspecified lower limb, including hip: Secondary | ICD-10-CM | POA: Diagnosis not present

## 2013-06-18 DIAGNOSIS — L821 Other seborrheic keratosis: Secondary | ICD-10-CM | POA: Diagnosis not present

## 2013-06-18 DIAGNOSIS — Z85828 Personal history of other malignant neoplasm of skin: Secondary | ICD-10-CM | POA: Diagnosis not present

## 2013-06-18 DIAGNOSIS — D485 Neoplasm of uncertain behavior of skin: Secondary | ICD-10-CM | POA: Diagnosis not present

## 2013-06-27 DIAGNOSIS — D692 Other nonthrombocytopenic purpura: Secondary | ICD-10-CM | POA: Diagnosis not present

## 2013-06-27 DIAGNOSIS — D0439 Carcinoma in situ of skin of other parts of face: Secondary | ICD-10-CM | POA: Diagnosis not present

## 2013-06-27 DIAGNOSIS — D0470 Carcinoma in situ of skin of unspecified lower limb, including hip: Secondary | ICD-10-CM | POA: Diagnosis not present

## 2013-06-27 DIAGNOSIS — D0430 Carcinoma in situ of skin of unspecified part of face: Secondary | ICD-10-CM | POA: Diagnosis not present

## 2013-06-27 DIAGNOSIS — Z85828 Personal history of other malignant neoplasm of skin: Secondary | ICD-10-CM | POA: Diagnosis not present

## 2013-07-11 ENCOUNTER — Other Ambulatory Visit: Payer: Self-pay | Admitting: Cardiology

## 2013-07-14 ENCOUNTER — Other Ambulatory Visit: Payer: Self-pay | Admitting: Cardiology

## 2013-08-07 DIAGNOSIS — H353190 Nonexudative age-related macular degeneration, unspecified eye, stage unspecified: Secondary | ICD-10-CM | POA: Diagnosis not present

## 2013-08-13 IMAGING — CR DG CHEST 2V
2 series · 2 of 2 positions shown · non-contrast
Comparison: 09/27/2011 and earlier.

CLINICAL DATA: 84-year-old female hypertension mitral valve repair.
Cardiac tumor.  Hiatal hernia.  Shortness of breath.

CHEST - 2 VIEW

[view not recorded (1 of 2)]
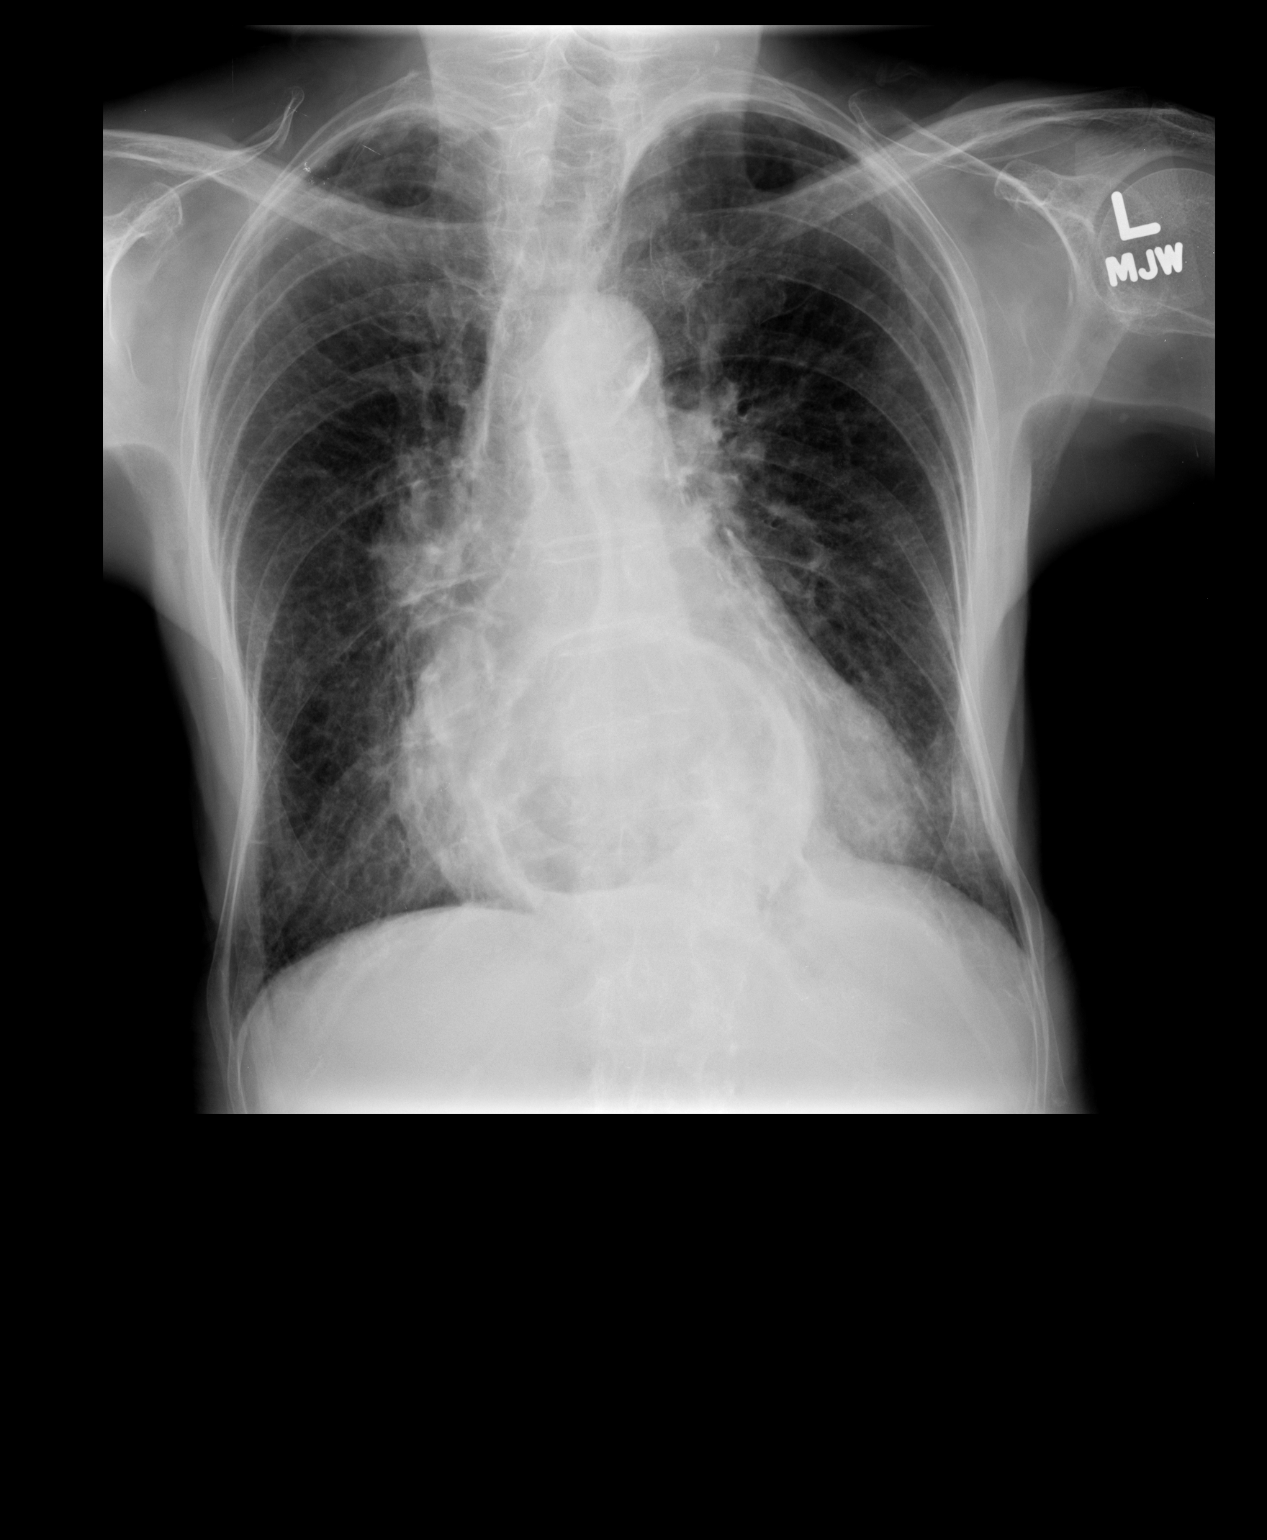

[view not recorded (2 of 2)]
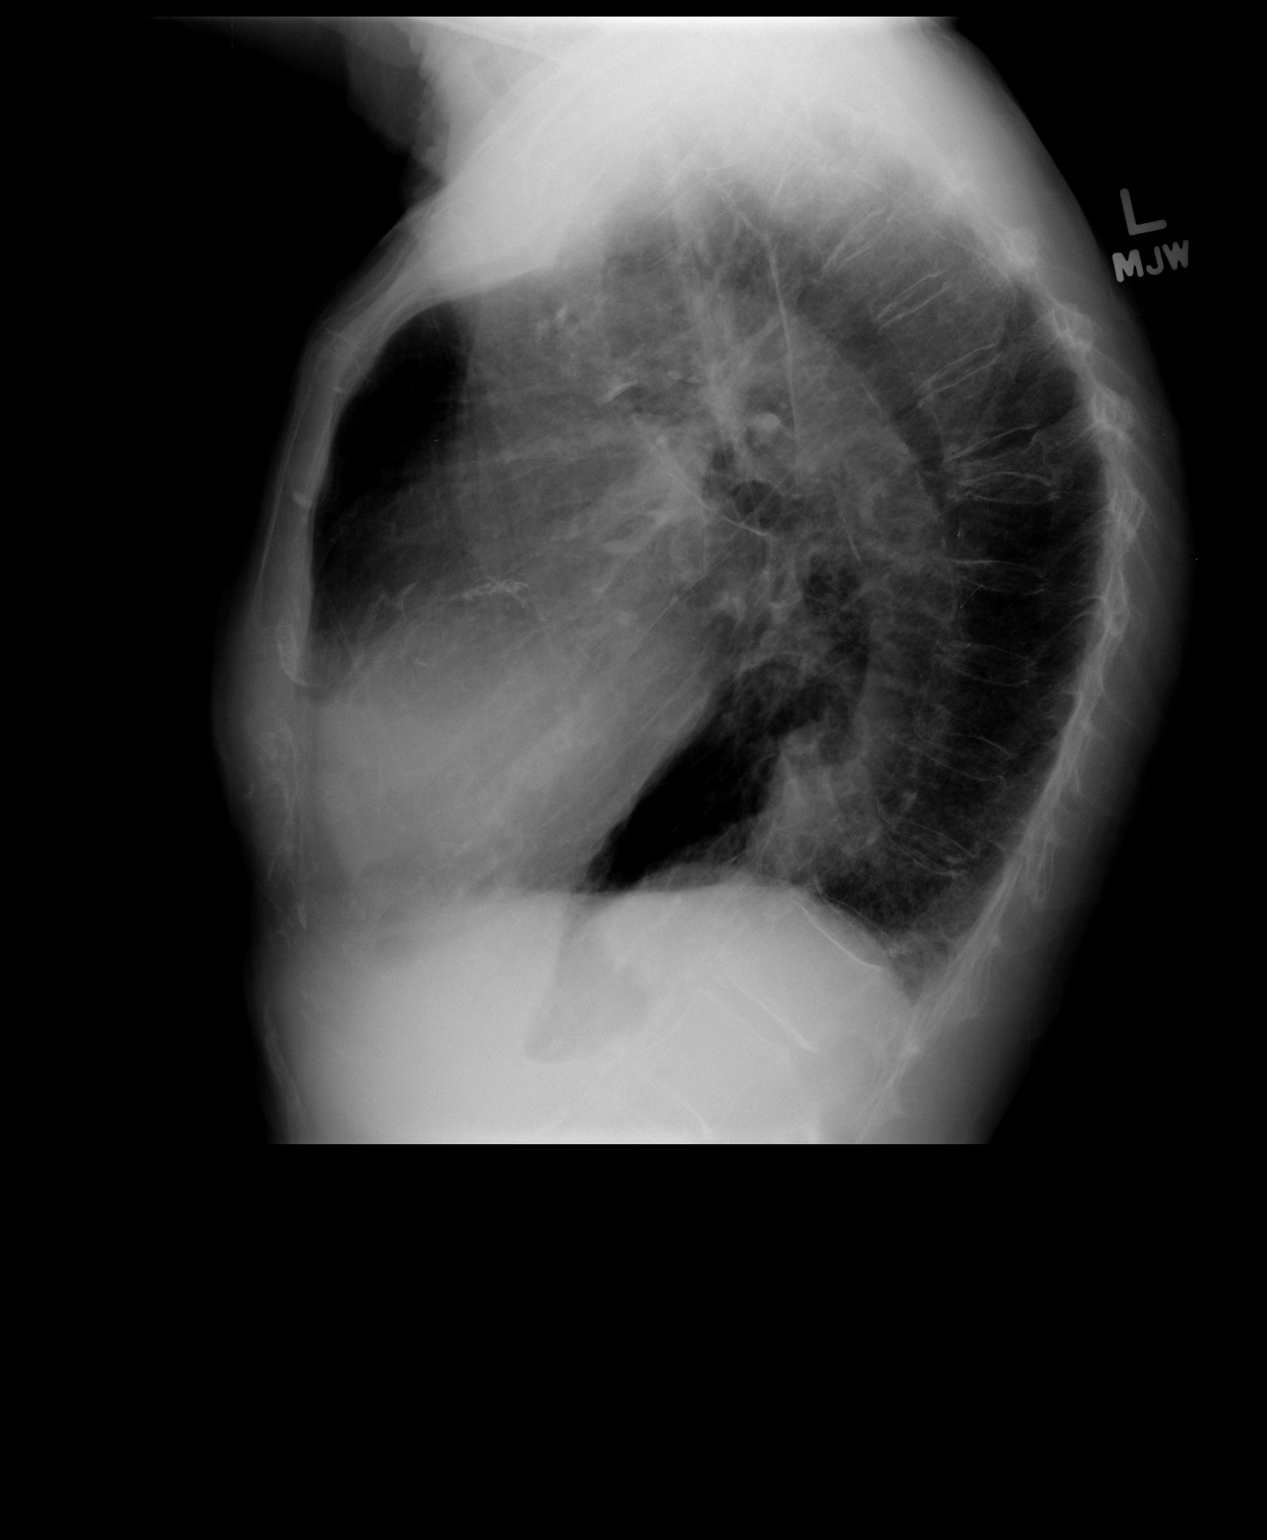

[2 of 2 positions shown; findings below may reference images not displayed]

FINDINGS: Pulmonary vascularity now is within normal limits.  Large
lung volumes.  Large retrocardiac gastric hernia which is increased
since 0320.  Mild cardiomegaly.  Tortuous thoracic aorta.
Questionable central pulmonary artery enlargement. Visualized
tracheal air column is within normal limits.  No pneumothorax,
pulmonary edema, pleural effusion or acute pulmonary opacity.
Severe mid thoracic compression fracture is chronic, first
identified in October 2010.  Osteopenia. No acute osseous
abnormality identified.
IMPRESSION: Interval resolved pulmonary edema.  No new cardiopulmonary
abnormality. Pulmonary hyperinflation, large hiatal hernia and
chronic mid thoracic compression fracture.

## 2013-10-08 ENCOUNTER — Other Ambulatory Visit: Payer: Self-pay | Admitting: Cardiology

## 2013-10-11 DIAGNOSIS — Z23 Encounter for immunization: Secondary | ICD-10-CM | POA: Diagnosis not present

## 2013-10-14 ENCOUNTER — Other Ambulatory Visit: Payer: Self-pay | Admitting: Cardiology

## 2013-10-16 ENCOUNTER — Other Ambulatory Visit: Payer: Self-pay | Admitting: Cardiology

## 2013-11-08 ENCOUNTER — Other Ambulatory Visit: Payer: Self-pay | Admitting: Cardiology

## 2013-11-16 ENCOUNTER — Ambulatory Visit (INDEPENDENT_AMBULATORY_CARE_PROVIDER_SITE_OTHER): Payer: Medicare Other | Admitting: Cardiology

## 2013-11-16 ENCOUNTER — Encounter: Payer: Self-pay | Admitting: Cardiology

## 2013-11-16 VITALS — BP 134/82 | HR 69 | Ht 65.0 in | Wt 102.0 lb

## 2013-11-16 DIAGNOSIS — Z79899 Other long term (current) drug therapy: Secondary | ICD-10-CM

## 2013-11-16 DIAGNOSIS — I10 Essential (primary) hypertension: Secondary | ICD-10-CM

## 2013-11-16 DIAGNOSIS — Z9889 Other specified postprocedural states: Secondary | ICD-10-CM | POA: Diagnosis not present

## 2013-11-16 DIAGNOSIS — I700 Atherosclerosis of aorta: Secondary | ICD-10-CM | POA: Diagnosis not present

## 2013-11-16 DIAGNOSIS — Z951 Presence of aortocoronary bypass graft: Secondary | ICD-10-CM | POA: Diagnosis not present

## 2013-11-16 DIAGNOSIS — I2510 Atherosclerotic heart disease of native coronary artery without angina pectoris: Secondary | ICD-10-CM | POA: Diagnosis not present

## 2013-11-16 DIAGNOSIS — D4989 Neoplasm of unspecified behavior of other specified sites: Secondary | ICD-10-CM

## 2013-11-16 DIAGNOSIS — I2583 Coronary atherosclerosis due to lipid rich plaque: Secondary | ICD-10-CM

## 2013-11-16 DIAGNOSIS — D489 Neoplasm of uncertain behavior, unspecified: Secondary | ICD-10-CM

## 2013-11-16 LAB — BASIC METABOLIC PANEL
BUN: 7 mg/dL (ref 6–23)
CHLORIDE: 105 meq/L (ref 96–112)
CO2: 29 meq/L (ref 19–32)
CREATININE: 0.7 mg/dL (ref 0.4–1.2)
Calcium: 9.5 mg/dL (ref 8.4–10.5)
GFR: 83.25 mL/min (ref >60.00)
Glucose, Bld: 98 mg/dL (ref 70–99)
POTASSIUM: 4.2 meq/L (ref 3.5–5.1)
Sodium: 138 mEq/L (ref 135–145)

## 2013-11-16 LAB — LIPID PANEL
Cholesterol: 172 mg/dL (ref 0–200)
HDL: 53 mg/dL (ref >39.00)
LDL Cholesterol: 102 mg/dL — ABNORMAL HIGH (ref 0–99)
NONHDL: 119
TRIGLYCERIDES: 83 mg/dL (ref 0.0–149.0)
Total CHOL/HDL Ratio: 3
VLDL: 16.6 mg/dL (ref 0.0–40.0)

## 2013-11-16 LAB — CBC
HCT: 32 % — ABNORMAL LOW (ref 36.0–46.0)
Hemoglobin: 9.7 g/dL — ABNORMAL LOW (ref 12.0–15.0)
MCHC: 30.3 g/dL (ref 30.0–36.0)
MCV: 68.7 fl — ABNORMAL LOW (ref 78.0–100.0)
Platelets: 245 10*3/uL (ref 150.0–400.0)
RBC: 4.65 Mil/uL (ref 3.87–5.11)
RDW: 20 % — AB (ref 11.5–15.5)
WBC: 9 10*3/uL (ref 4.0–10.5)

## 2013-11-16 LAB — ALT: ALT: 12 U/L (ref 0–35)

## 2013-11-16 MED ORDER — METOPROLOL SUCCINATE ER 100 MG PO TB24: ORAL_TABLET | ORAL | Status: DC

## 2013-11-16 NOTE — Patient Instructions (Signed)
The current medical regimen is effective;  continue present plan and medications.  Please have blood work today. (CBC,BMP,ALT and Lipid)  Follow up in 6 months with Dr. Marlou Porch.  You will receive a letter in the mail 2 months before you are due.  Please call us when you receive this letter to schedule your follow up appointment.

## 2013-11-16 NOTE — Progress Notes (Signed)
[place]. [address], [place]  [address] Phone: [phone] Fax:  [phone]  Date:  11/16/2013   ID:  Victoria Sexton, Victoria Sexton 1927/07/16, MRN [id number]  PCP:  Vidal Schwalbe, MD   History of Present Illness: Victoria Sexton is a 80 y.o. female female with coronary artery disease status post bypass surgery as well as mitral valve repair by Dr. Roxy Manns on 10/12/11 with LIMA to LAD, SVG to RCA, SVG to obtuse marginal, with patent foramen ovale closure and a benign-appearing endocardial mass removal here for followup.  She has had aortic atherosclerosis seen previously on CT scan and is currently being treated with statin medications. LDL in the past has been 91.  She has battled with mild malnutrition and is currently taking Ensure supplements. She has always been quite thin. Her husband is a retired Designer, multimedia.  She showed me her blood pressure recordings. For the most part they are excellent. Occasional elevated blood pressure.  She's not having any significant symptoms. No fevers, no chest pain, no strokelike symptoms, no palpitations.   Wt Readings from Last 3 Encounters:  11/16/13 102 lb (46.267 kg)  10/25/12 98 lb (44.453 kg)  05/08/12 99 lb (44.906 kg)     Past Medical History  Diagnosis Date  . Hypertension   . MVP (mitral valve prolapse)   . Osteoporosis   . Inguinal hernia right  . Mitral regurgitation due to cusp prolapse   . Cardiac tumor, ventricular     LVOT  . PVC (premature ventricular contraction)   . Hiatal hernia   . Coronary artery disease 10/04/2011  . Congestive heart failure 10/04/2011  . Heart murmur   . Shortness of breath   . Anginal pain   . Hernia, hiatal     Left wears a hernia belt  . Skin cancer 2013    Face.  . S/P mitral valve repair 10/12/2011    Complex valvuloplasty including triangular resection of flail posterior leaflet with artificial Goretex neocord placement x2 and 26 mm Sorin Memo 3D ring annuloplasty  . S/P CABG x 3  10/12/2011    LIMA to LAD, SVG to OM, SVG to RCA, EVH via left thigh and leg  . Thoracic compression fracture   . Hx of CABG     -LIMA to LAD,SVG to RCA, SVG to OM, mitral valve repair, patent foramen ovale closure, benign-apearing mass removal.  . Aortic atherosclerosis 10/25/2012    Upper aortic atherosclerosis seen on CT scan 08/13/2002  . Mild malnutrition 10/25/2012    Past Surgical History  Procedure Laterality Date  . Abdominal hysterectomy    . Leg fx repair      Right  . Hemorroidectomy    . Tee without cardioversion  11/13/2010    Procedure: TRANSESOPHAGEAL ECHOCARDIOGRAM (TEE);  Surgeon: Candee Furbish, MD;  Location: [facility] ENDOSCOPY;  Service: Cardiovascular;  Laterality: N/A;  . Skin cancer excision  2013    not melanoma  . Hemorrhoid surgery    . Mitral valve repair  10/12/2011    Procedure: MITRAL VALVE REPAIR (MVR);  Surgeon: Rexene Alberts, MD;  Location: [place];  Service: Open Heart Surgery;  Laterality: N/A;  MITRAL VALVE REPAIR  . Coronary artery bypass graft  10/12/2011    Procedure: CORONARY ARTERY BYPASS GRAFTING (CABG);  Surgeon: Rexene Alberts, MD;  Location: [place];  Service: Open Heart Surgery;  Laterality: N/A;  RESECTION OF LV MASS  . Patent foramen ovale closure  10/12/2011    Procedure: PATENT FORAMEN OVALE CLOSURE;  Surgeon: Rexene Alberts, MD;  Location: [place];  Service: Open Heart Surgery;  Laterality: N/A;    Current Outpatient Prescriptions  Medication Sig Dispense Refill  . aspirin 81 MG tablet Take 81 mg by mouth daily.     [name] co-enzyme Q-10 30 MG capsule Take 30 mg by mouth daily.     [name] lisinopril (PRINIVIL,ZESTRIL) 5 MG tablet TAKE 1 TABLET ONCE DAILY. 90 tablet 1  . metoprolol succinate (TOPROL-XL) 100 MG 24 hr tablet TAKE 1 TABLET ONCE DAILY. 30 tablet 0  . multivitamin-lutein (OCUVITE-LUTEIN) CAPS Take 1 capsule by mouth daily. 6MG     . OVER THE COUNTER MEDICATION Take 1 tablet by mouth daily. Omega Red    . simvastatin (ZOCOR) 5 MG tablet TAKE 1  TABLET ONCE DAILY. 90 tablet 3   No current facility-administered medications for this visit.    Allergies:    Allergies  Allergen Reactions  . Hydrocodone-Acetaminophen Shortness Of Breath  . Penicillins Palpitations  . Cyclobenzaprine Other (See Comments)    Dry mouth  . Sulfa Antibiotics Rash    Social History:  The patient  reports that she quit smoking about 3 years ago. Her smoking use included Cigarettes. She has a 15 pack-year smoking history. She does not have any smokeless tobacco history on file. She reports that she does not drink alcohol or use illicit drugs.   ROS:  Please see the history of present illness.   Denies any syncope, bleeding, orthopnea, PND   All other systems reviewed and negative.   PHYSICAL EXAM: VS:  BP 134/82 mmHg  Pulse 69  Ht 5\' 5"  (1.651 m)  Wt 102 lb (46.267 kg)  BMI 16.97 kg/m2 Thin, elderly, kyphotic, in no acute distress HEENT: normal Neck: no JVD Cardiac:  normal S1, S2; RRR; no murmur Lungs:  clear to auscultation bilaterally, no wheezing, rhonchi or rales Abd: soft, nontender, no hepatomegaly Ext: no edema Skin: warm and dry Neuro: no focal abnormalities noted  EKG: 11/16/13 Sinus rhythm, 69 bpm, rightward axis, old septal infarct pattern.     ECHO: 10/18/11 (post repair of MV) - Left ventricle: The cavity size was normal. There was severe concentric hypertrophy. The estimated ejection fraction was 55%. Wall motion was normal; there were no regional wall motion abnormalities. There was an increased relative contribution of atrial contraction to ventricular filling. Doppler parameters are consistent with abnormal left ventricular relaxation (grade 1 diastolic dysfunction). - Aortic valve: Moderate thickening and calcification, consistent with sclerosis. - Mitral valve: By mean gradient there appears to be moderate mitral stenosis but MVA by pressure half-time appears normal. Prior procedures included surgical repair. An  annular ring prosthesis was present. Moderate thickening, with moderate involvement of chords. - Left atrium: The atrium was mild to moderately dilated.  ASSESSMENT AND PLAN:  1. Coronary artery disease-stable, no exertional anginal symptoms. Post bypass. Benign intracardiac mass removed during surgical procedure. Doing well. Exercise 2. Mitral valve repair, doing well, stable 3. Hypertension-good control. She is not on amlodipine. I'm fine with her not being on this medication. Will check BMET, CBC (on ASA). 4. Hyperlipidemia - check lipids, ALT 5. Mild malnutrition-encourage ensure supplementation. BMI less than 20. 6. Aortic atherosclerosis-continue with secondary prevention, low-dose simvastatin. 7. We will see back in 6 months.  Signed, Candee Furbish, MD [facility]  11/16/2013 9:38 AM

## 2013-11-26 DIAGNOSIS — I700 Atherosclerosis of aorta: Secondary | ICD-10-CM | POA: Diagnosis not present

## 2013-11-26 DIAGNOSIS — M810 Age-related osteoporosis without current pathological fracture: Secondary | ICD-10-CM | POA: Diagnosis not present

## 2013-11-26 DIAGNOSIS — E785 Hyperlipidemia, unspecified: Secondary | ICD-10-CM | POA: Diagnosis not present

## 2013-11-26 DIAGNOSIS — E639 Nutritional deficiency, unspecified: Secondary | ICD-10-CM | POA: Diagnosis not present

## 2013-11-26 DIAGNOSIS — Z23 Encounter for immunization: Secondary | ICD-10-CM | POA: Diagnosis not present

## 2013-11-26 DIAGNOSIS — I10 Essential (primary) hypertension: Secondary | ICD-10-CM | POA: Diagnosis not present

## 2013-11-26 DIAGNOSIS — J309 Allergic rhinitis, unspecified: Secondary | ICD-10-CM | POA: Diagnosis not present

## 2013-11-26 DIAGNOSIS — D509 Iron deficiency anemia, unspecified: Secondary | ICD-10-CM | POA: Diagnosis not present

## 2013-11-26 DIAGNOSIS — I2510 Atherosclerotic heart disease of native coronary artery without angina pectoris: Secondary | ICD-10-CM | POA: Diagnosis not present

## 2013-12-03 DIAGNOSIS — Z1211 Encounter for screening for malignant neoplasm of colon: Secondary | ICD-10-CM | POA: Diagnosis not present

## 2013-12-03 DIAGNOSIS — D509 Iron deficiency anemia, unspecified: Secondary | ICD-10-CM | POA: Diagnosis not present

## 2013-12-13 ENCOUNTER — Encounter (HOSPITAL_COMMUNITY): Payer: Self-pay | Admitting: Cardiology

## 2013-12-29 ENCOUNTER — Encounter (HOSPITAL_COMMUNITY): Payer: Self-pay | Admitting: Oncology

## 2013-12-29 ENCOUNTER — Observation Stay (HOSPITAL_COMMUNITY)
Admission: EM | Admit: 2013-12-29 | Discharge: 2013-12-29 | Disposition: A | Discharge status: Home / Self Care | Payer: Medicare Other | Attending: Internal Medicine | Admitting: Internal Medicine

## 2013-12-29 ENCOUNTER — Emergency Department (HOSPITAL_COMMUNITY): Payer: Medicare Other

## 2013-12-29 DIAGNOSIS — Z951 Presence of aortocoronary bypass graft: Secondary | ICD-10-CM | POA: Diagnosis not present

## 2013-12-29 DIAGNOSIS — I10 Essential (primary) hypertension: Secondary | ICD-10-CM | POA: Diagnosis not present

## 2013-12-29 DIAGNOSIS — M810 Age-related osteoporosis without current pathological fracture: Secondary | ICD-10-CM | POA: Diagnosis not present

## 2013-12-29 DIAGNOSIS — Z7982 Long term (current) use of aspirin: Secondary | ICD-10-CM | POA: Diagnosis not present

## 2013-12-29 DIAGNOSIS — Z952 Presence of prosthetic heart valve: Secondary | ICD-10-CM | POA: Diagnosis not present

## 2013-12-29 DIAGNOSIS — R0902 Hypoxemia: Secondary | ICD-10-CM | POA: Diagnosis not present

## 2013-12-29 DIAGNOSIS — R0602 Shortness of breath: Secondary | ICD-10-CM | POA: Diagnosis not present

## 2013-12-29 DIAGNOSIS — J449 Chronic obstructive pulmonary disease, unspecified: Secondary | ICD-10-CM | POA: Diagnosis not present

## 2013-12-29 DIAGNOSIS — Z881 Allergy status to other antibiotic agents status: Secondary | ICD-10-CM | POA: Insufficient documentation

## 2013-12-29 DIAGNOSIS — I5033 Acute on chronic diastolic (congestive) heart failure: Secondary | ICD-10-CM | POA: Diagnosis not present

## 2013-12-29 DIAGNOSIS — E785 Hyperlipidemia, unspecified: Secondary | ICD-10-CM | POA: Insufficient documentation

## 2013-12-29 DIAGNOSIS — Z87891 Personal history of nicotine dependence: Secondary | ICD-10-CM | POA: Insufficient documentation

## 2013-12-29 DIAGNOSIS — I2510 Atherosclerotic heart disease of native coronary artery without angina pectoris: Secondary | ICD-10-CM | POA: Insufficient documentation

## 2013-12-29 DIAGNOSIS — I341 Nonrheumatic mitral (valve) prolapse: Secondary | ICD-10-CM | POA: Insufficient documentation

## 2013-12-29 DIAGNOSIS — I5031 Acute diastolic (congestive) heart failure: Secondary | ICD-10-CM

## 2013-12-29 DIAGNOSIS — I517 Cardiomegaly: Secondary | ICD-10-CM | POA: Diagnosis not present

## 2013-12-29 LAB — BLOOD GAS, ARTERIAL
ACID-BASE EXCESS: 0.2 mmol/L (ref 0.0–2.0)
Bicarbonate: 23 mEq/L (ref 20.0–24.0)
DRAWN BY: 422461
FIO2: 0.21 %
O2 SAT: 94 %
PATIENT TEMPERATURE: 98.6
PCO2 ART: 32.3 mmHg — AB (ref 35.0–45.0)
TCO2: 21.2 mmol/L (ref 0–100)
pH, Arterial: 7.467 — ABNORMAL HIGH (ref 7.350–7.450)
pO2, Arterial: 65.7 mmHg — ABNORMAL LOW (ref 80.0–100.0)

## 2013-12-29 LAB — CBC
HEMATOCRIT: 34 % — AB (ref 36.0–46.0)
Hemoglobin: 10.1 g/dL — ABNORMAL LOW (ref 12.0–15.0)
MCH: 22 pg — AB (ref 26.0–34.0)
MCHC: 29.7 g/dL — ABNORMAL LOW (ref 30.0–36.0)
MCV: 73.9 fL — AB (ref 78.0–100.0)
PLATELETS: 239 10*3/uL (ref 150–400)
RBC: 4.6 MIL/uL (ref 3.87–5.11)
RDW: 20.4 % — ABNORMAL HIGH (ref 11.5–15.5)
WBC: 13 10*3/uL — AB (ref 4.0–10.5)

## 2013-12-29 LAB — I-STAT TROPONIN, ED: TROPONIN I, POC: 0.01 ng/mL (ref 0.00–0.08)

## 2013-12-29 LAB — COMPREHENSIVE METABOLIC PANEL
ALT: 13 U/L (ref 0–35)
ANION GAP: 9 (ref 5–15)
AST: 28 U/L (ref 0–37)
Albumin: 4.2 g/dL (ref 3.5–5.2)
Alkaline Phosphatase: 70 U/L (ref 39–117)
BILIRUBIN TOTAL: 0.4 mg/dL (ref 0.3–1.2)
BUN: 9 mg/dL (ref 6–23)
CALCIUM: 9.3 mg/dL (ref 8.4–10.5)
CHLORIDE: 104 meq/L (ref 96–112)
CO2: 23 mmol/L (ref 19–32)
CREATININE: 0.71 mg/dL (ref 0.50–1.10)
GFR, EST AFRICAN AMERICAN: 89 mL/min — AB (ref >90)
GFR, EST NON AFRICAN AMERICAN: 77 mL/min — AB (ref >90)
Glucose, Bld: 115 mg/dL — ABNORMAL HIGH (ref 70–99)
Potassium: 3.6 mmol/L (ref 3.5–5.1)
Sodium: 136 mmol/L (ref 135–145)
Total Protein: 7.6 g/dL (ref 6.0–8.3)

## 2013-12-29 LAB — TROPONIN I: Troponin I: <0.03 ng/mL (ref <0.031)

## 2013-12-29 MED ORDER — METOPROLOL SUCCINATE ER 50 MG PO TB24
50.0000 mg | ORAL_TABLET | Freq: Two times a day (BID) | ORAL | Status: DC
  Administered 2013-12-29: 50 mg via ORAL

## 2013-12-29 MED ORDER — ACETAMINOPHEN 325 MG PO TABS
650.0000 mg | ORAL_TABLET | ORAL | Status: DC | PRN
  Administered 2013-12-29: 650 mg via ORAL
  Filled 2013-12-29: qty 2

## 2013-12-29 MED ORDER — HEPARIN SODIUM (PORCINE) 5000 UNIT/ML IJ SOLN
5000.0000 [IU] | Freq: Three times a day (TID) | INTRAMUSCULAR | Status: DC
  Administered 2013-12-29: 5000 [IU] via SUBCUTANEOUS
  Filled 2013-12-29 (×2): qty 1

## 2013-12-29 MED ORDER — FUROSEMIDE 10 MG/ML IJ SOLN
40.0000 mg | Freq: Once | INTRAMUSCULAR | Status: AC
  Administered 2013-12-29: 40 mg via INTRAVENOUS
  Filled 2013-12-29: qty 4

## 2013-12-29 MED ORDER — METOPROLOL SUCCINATE ER 50 MG PO TB24: 50.0000 mg | ORAL_TABLET | Freq: Two times a day (BID) | ORAL | Status: DC

## 2013-12-29 MED ORDER — LISINOPRIL 5 MG PO TABS
5.0000 mg | ORAL_TABLET | Freq: Every day | ORAL | Status: DC
  Administered 2013-12-29: 5 mg via ORAL
  Filled 2013-12-29: qty 1

## 2013-12-29 MED ORDER — FUROSEMIDE 20 MG PO TABS
20.0000 mg | ORAL_TABLET | Freq: Two times a day (BID) | ORAL | Status: DC
  Administered 2013-12-29: 20 mg via ORAL
  Filled 2013-12-29: qty 1

## 2013-12-29 MED ORDER — ASPIRIN 81 MG PO CHEW
81.0000 mg | CHEWABLE_TABLET | Freq: Every day | ORAL | Status: DC
  Administered 2013-12-29: 81 mg via ORAL
  Filled 2013-12-29: qty 1

## 2013-12-29 MED ORDER — SODIUM CHLORIDE 0.9 % IJ SOLN
3.0000 mL | Freq: Two times a day (BID) | INTRAMUSCULAR | Status: DC
  Administered 2013-12-29 (×2): 3 mL via INTRAVENOUS

## 2013-12-29 MED ORDER — METOPROLOL SUCCINATE ER 100 MG PO TB24
100.0000 mg | ORAL_TABLET | Freq: Every day | ORAL | Status: DC
Start: 2013-12-29 — End: 2013-12-29

## 2013-12-29 MED ORDER — SIMVASTATIN 5 MG PO TABS
5.0000 mg | ORAL_TABLET | Freq: Every day | ORAL | Status: DC
  Administered 2013-12-29: 5 mg via ORAL
  Filled 2013-12-29: qty 1

## 2013-12-29 MED ORDER — SODIUM CHLORIDE 0.9 % IJ SOLN: 3.0000 mL | INTRAMUSCULAR | Status: DC | PRN

## 2013-12-29 MED ORDER — FUROSEMIDE 20 MG PO TABS: 20.0000 mg | ORAL_TABLET | Freq: Every day | ORAL | Status: DC

## 2013-12-29 MED ORDER — POTASSIUM CHLORIDE ER 10 MEQ PO TBCR: 20.0000 meq | EXTENDED_RELEASE_TABLET | Freq: Every day | ORAL | Status: DC

## 2013-12-29 MED ORDER — ONDANSETRON HCL 4 MG/2ML IJ SOLN: 4.0000 mg | Freq: Four times a day (QID) | INTRAMUSCULAR | Status: DC | PRN

## 2013-12-29 MED ORDER — SODIUM CHLORIDE 0.9 % IV SOLN: 250.0000 mL | INTRAVENOUS | Status: DC | PRN

## 2013-12-29 NOTE — ED Notes (Signed)
Both I-stat troponin orders were clicked off in ED narrator. Only the I-stat troponin from 0038 was ran. The I-stat troponin ordered at 0044 was not ran.

## 2013-12-29 NOTE — Progress Notes (Signed)
Pt transported to [facility] and transported to bed. VSS at this time. Husband at bedside. NAD. Pt ambulating to the bathroom with 1 assist. Will continue to monitor pt and carry out POC. Carnella [place] I

## 2013-12-29 NOTE — Progress Notes (Addendum)
Patient is an observation patient, and therefore can take home meds, with RN supervision.

## 2013-12-29 NOTE — H&P (Signed)
[facility] History and Physical  Victoria Sexton GUR:[id number] DOB: 1927-10-14 DOA: 12/29/2013  Referring physician: EDP PCP: Vidal Schwalbe, MD   Chief Complaint: SOB   HPI: Victoria Sexton is a 80 y.o. female with history of MVP s/p repair and CABG in 2013.  Patient presents to the ED after developing SOB at 7pm this evening.  No h/o travel or blood clots.  Patient is concerned that she may have "fluid on her lungs like she did with the MVP".  No chest pain.  Does admit to eating and drinking significantly more fluid than normal yesterday (Christmas).  No leg swelling  Review of Systems: Systems reviewed.  As above, otherwise negative  Past Medical History  Diagnosis Date  . Hypertension   . MVP (mitral valve prolapse)   . Osteoporosis   . Inguinal hernia right  . Mitral regurgitation due to cusp prolapse   . Cardiac tumor, ventricular     LVOT  . PVC (premature ventricular contraction)   . Hiatal hernia   . Coronary artery disease 10/04/2011  . Congestive heart failure 10/04/2011  . Heart murmur   . Shortness of breath   . Anginal pain   . Hernia, hiatal     Left wears a hernia belt  . Skin cancer 2013    Face.  . S/P mitral valve repair 10/12/2011    Complex valvuloplasty including triangular resection of flail posterior leaflet with artificial Goretex neocord placement x2 and 26 mm Sorin Memo 3D ring annuloplasty  . S/P CABG x 3 10/12/2011    LIMA to LAD, SVG to OM, SVG to RCA, EVH via left thigh and leg  . Thoracic compression fracture   . Hx of CABG     -LIMA to LAD,SVG to RCA, SVG to OM, mitral valve repair, patent foramen ovale closure, benign-apearing mass removal.  . Aortic atherosclerosis 10/25/2012    Upper aortic atherosclerosis seen on CT scan 08/13/2002  . Mild malnutrition 10/25/2012   Past Surgical History  Procedure Laterality Date  . Abdominal hysterectomy    . Leg fx repair      Right  . Hemorroidectomy    . Tee without cardioversion  11/13/2010     Procedure: TRANSESOPHAGEAL ECHOCARDIOGRAM (TEE);  Surgeon: Candee Furbish, MD;  Location: [facility] ENDOSCOPY;  Service: Cardiovascular;  Laterality: N/A;  . Skin cancer excision  2013    not melanoma  . Hemorrhoid surgery    . Mitral valve repair  10/12/2011    Procedure: MITRAL VALVE REPAIR (MVR);  Surgeon: Rexene Alberts, MD;  Location: [place];  Service: Open Heart Surgery;  Laterality: N/A;  MITRAL VALVE REPAIR  . Coronary artery bypass graft  10/12/2011    Procedure: CORONARY ARTERY BYPASS GRAFTING (CABG);  Surgeon: Rexene Alberts, MD;  Location: [place];  Service: Open Heart Surgery;  Laterality: N/A;  RESECTION OF LV MASS  . Patent foramen ovale closure  10/12/2011    Procedure: PATENT FORAMEN OVALE CLOSURE;  Surgeon: Rexene Alberts, MD;  Location: [place];  Service: Open Heart Surgery;  Laterality: N/A;  . Left and right heart catheterization with coronary angiogram N/A 09/29/2011    Procedure: LEFT AND RIGHT HEART CATHETERIZATION WITH CORONARY ANGIOGRAM;  Surgeon: Candee Furbish, MD;  Location: [facility] CATH LAB;  Service: Cardiovascular;  Laterality: N/A;   Social History:  reports that she quit smoking about 3 years ago. Her smoking use included Cigarettes. She has a 15 pack-year smoking history. She does not have any  smokeless tobacco history on file. She reports that she does not drink alcohol or use illicit drugs.  Allergies  Allergen Reactions  . Hydrocodone-Acetaminophen Shortness Of Breath  . Penicillins Palpitations  . Cyclobenzaprine Other (See Comments)    Dry mouth  . Sulfa Antibiotics Rash    Family History  Problem Relation Age of Onset  . Heart attack Mother   . Lung cancer Father      Prior to Admission medications   Medication Sig Start Date End Date Taking? Authorizing Provider  aspirin 81 MG tablet Take 81 mg by mouth daily.     Historical Provider, MD  co-enzyme Q-10 30 MG capsule Take 30 mg by mouth daily.     Historical Provider, MD  lisinopril (PRINIVIL,ZESTRIL) 5 MG tablet  TAKE 1 TABLET ONCE DAILY.    Candee Furbish, MD  metoprolol succinate (TOPROL-XL) 100 MG 24 hr tablet TAKE 1 TABLET ONCE DAILY. 11/16/13   Candee Furbish, MD  multivitamin-lutein [facility]) CAPS Take 1 capsule by mouth daily. 6MG     Historical Provider, MD  OVER THE COUNTER MEDICATION Take 1 tablet by mouth daily. Omega Red    Historical Provider, MD  simvastatin (ZOCOR) 5 MG tablet TAKE 1 TABLET ONCE DAILY. 10/17/13   Candee Furbish, MD   Physical Exam: Filed Vitals:   12/29/13 0324  BP: 155/66  Pulse: 98  Temp:   Resp: 18    BP 155/66 mmHg  Pulse 98  Temp(Src) 97.9 F (36.6 C)  Resp 18  SpO2 95%  General Appearance:    Alert, oriented, no distress, appears stated age  Head:    Normocephalic, atraumatic  Eyes:    PERRL, EOMI, sclera non-icteric        Nose:   Nares without drainage or epistaxis. Mucosa, turbinates normal  Throat:   Moist mucous membranes. Oropharynx without erythema or exudate.  Neck:   Supple. No carotid bruits.  No thyromegaly.  No lymphadenopathy.   Back:     No CVA tenderness, no spinal tenderness  Lungs:     Clear to auscultation bilaterally, without wheezes, rhonchi or rales  Chest wall:    No tenderness to palpitation  Heart:    Regular rate and rhythm without murmurs, gallops, rubs  Abdomen:     Rales diffusely, few rhonchi.  Genitalia:    deferred  Rectal:    deferred  Extremities:   No clubbing, cyanosis or edema.  Pulses:   2+ and symmetric all extremities  Skin:   Skin color, texture, turgor normal, no rashes or lesions  Lymph nodes:   Cervical, supraclavicular, and axillary nodes normal  Neurologic:   CNII-XII intact. Normal strength, sensation and reflexes      throughout    Labs on Admission:  Basic Metabolic Panel:  Recent Labs Lab 12/29/13 0041  NA 136  K 3.6  CL 104  CO2 23  GLUCOSE 115*  BUN 9  CREATININE 0.71  CALCIUM 9.3   Liver Function Tests:  Recent Labs Lab 12/29/13 0041  AST 28  ALT 13  ALKPHOS 70  BILITOT 0.4   PROT 7.6  ALBUMIN 4.2   No results for input(s): LIPASE, AMYLASE in the last 168 hours. No results for input(s): AMMONIA in the last 168 hours. CBC:  Recent Labs Lab 12/29/13 0041  WBC 13.0*  HGB 10.1*  HCT 34.0*  MCV 73.9*  PLT 239   Cardiac Enzymes: No results for input(s): CKTOTAL, CKMB, CKMBINDEX, TROPONINI in the last 168 hours.  BNP (  last 3 results) No results for input(s): PROBNP in the last 8760 hours. CBG: No results for input(s): GLUCAP in the last 168 hours.  Radiological Exams on Admission: Dg Chest 2 View  12/29/2013   CLINICAL DATA:  Shortness of breath for 6 hr. History of cardiac tumor, CHF.  EXAM: CHEST  2 VIEW  COMPARISON:  Chest radiograph November 15, 2011  FINDINGS: The cardiac silhouette is moderately enlarged, unchanged. Mediastinal silhouette is nonsuspicious, mildly calcified aortic knob. Status post median sternotomy for CABG and mitral valve replacement. Mild chronic interstitial changes, increased lung volumes consistent with COPD. No pleural effusion or focal consolidation. Large air-filled hiatal hernia. No pneumothorax.  Patient is kyphotic, osteopenic. Chronic single mid thoracic severe compression deformity.  IMPRESSION: Stable cardiomegaly, COPD.  No superimposed acute pulmonary process.  Large hiatal hernia.   Electronically Signed   By: Elon Alas   On: 12/29/2013 01:17    EKG: Independently reviewed.  Assessment/Plan Principal Problem:   Acute diastolic CHF (congestive heart failure) Active Problems:   Essential hypertension, benign   Hypoxia   1. Acute diastolic CHF - 1. Lasix 40mg  IV x1 ordered. 2. On heart failure pathway 3. Believe patient may be fluid overloaded due to increased PO intake from baseline yesterday. 4. Serial trops ordered. 5. Tele monitor 2. HTN - continue home BP meds    Code Status: Full Code  Family Communication: Husband at bedside Disposition Plan: Admit to inpatient   Time spent: 70  min  Asier Desroches M. [facility] Pager [phone]  If 7AM-7PM, please contact the day team taking care of the patient [email or web address] Password [email or web address] 12/29/2013, 3:35 AM

## 2013-12-29 NOTE — Discharge Summary (Signed)
Physician Discharge Summary  Victoria Sexton FYB:[id number] DOB: [birth year] DOA: 12/29/2013  PCP: Vidal Schwalbe, MD  Admit date: 12/29/2013 Discharge date: 12/29/2013  Time spent: 30 minutes  Recommendations for Outpatient Follow-up:  Check BMET to follow electrolytes and renal function Reassess BP and adjust medications as needed Will need close follow up with Dr. Marlou Porch for 2-D echo and medication adjustments  Discharge Diagnoses:  Acute on chronic diastolic CHF (congestive heart failure) Essential hypertension, benign HLD CAD SOB  Discharge Condition: stable and improved. Will discharge home with instructions to follow with PCP and cardiologist as an outpatient. No CP or SOB at dischareg.  Diet recommendation: low sodium diet  Filed Weights   12/29/13 0401  Weight: 42.9 kg (94 lb 9.2 oz)    History of present illness:  80 y.o. female with history of MVP s/p repair and CABG in 2013. Patient presents to the ED after developing SOB at 7pm this evening. No h/o travel or blood clots. Patient is concerned that she may have "fluid on her lungs like she did with the MVP". No chest pain. Does admit to eating and drinking significantly more fluid and sodium. No leg swelling  Hospital Course:  1-acute on chronic diastolic heart failure: appears to be secondary to diet indiscretion (to much sodium) -patient responded properly to diuresis therapy -no CP, no palpitations, no SOB at discharge -recommendations for low sodium diet provided -will discharge on lasix 20mg  daily -patient to follow with Dr. Marlou Porch in the next 5 days; will benefit of repeat 2-D echo -troponin neg X3  2-essential HTN: continue home medication regimen -low sodium diet and lasix now added (which would provide better control)  3-Anemia: appears to be AOCD with low iron -continue iron supplementation -Hgb 10.1  4-HLD: continue statins  5-CAD: continue ASA, statins and b-blockers -no CP and neg  troponin -follow up with cardiology as an outpatient  Procedures:  See below for x-ray reports   Consultations:  None   Discharge Exam: Filed Vitals:   12/29/13 1000  BP: 114/64  Pulse: 102  Temp:   Resp:     General: no CP, no SOB. Patient feeling good and breathing comfortable after lasix therapy provided. Cardiovascular: S1 and S2, no rubs or gallops Respiratory: CTA bilaterally  Abd: soft, NT, ND, positive BS Extremities: no edema, no cyanosis or clubbing   Discharge Instructions  Discharge Instructions    Diet - low sodium heart healthy    Complete by:  As directed      Discharge instructions    Complete by:  As directed   Take medications as prescribed Arrange follow up with Dr. Marlou Porch in the next 5 days Follow a low sodium diet (less than 2 gram daily) Keep appointment with PCP on 12/31/13          Current Discharge Medication List    START taking these medications   Details  furosemide (LASIX) 20 MG tablet Take 1 tablet (20 mg total) by mouth daily. Qty: 30 tablet, Refills: 1    potassium chloride (K-DUR) 10 MEQ tablet Take 2 tablets (20 mEq total) by mouth daily. Qty: 60 tablet, Refills: 0      CONTINUE these medications which have CHANGED   Details  metoprolol succinate (TOPROL-XL) 50 MG 24 hr tablet Take 1 tablet (50 mg total) by mouth 2 (two) times daily. Take with or immediately following a meal. Qty: 60 tablet, Refills: 1      CONTINUE these medications which have NOT CHANGED  Details  aspirin 81 MG tablet Take 81 mg by mouth daily.     co-enzyme Q-10 30 MG capsule Take 30 mg by mouth daily.     lisinopril (PRINIVIL,ZESTRIL) 5 MG tablet TAKE 1 TABLET ONCE DAILY. Qty: 90 tablet, Refills: 1    multivitamin-lutein (OCUVITE-LUTEIN) CAPS Take 1 capsule by mouth daily. 6MG     OVER THE COUNTER MEDICATION Take 1 tablet by mouth daily. Omega Red    simvastatin (ZOCOR) 5 MG tablet TAKE 1 TABLET ONCE DAILY. Qty: 90 tablet, Refills: 3        Allergies  Allergen Reactions  . Hydrocodone-Acetaminophen Shortness Of Breath  . Penicillins Palpitations  . Cyclobenzaprine Other (See Comments)    Dry mouth  . Sulfa Antibiotics Rash   Follow-up Information    Follow up with Vidal Schwalbe, MD On 12/31/2013.   Specialty:  Family Medicine   Why:  as already scheduled   Contact information:   [place], [place] [address] [phone]       Follow up with Candee Furbish, MD. Schedule an appointment as soon as possible for a visit in 5 days.   Specialty:  Cardiology   Why:  will set up appointment for you in this time frame; but feel free to call office for info regarding appointment details   Contact information:   [address] [place] [address] [phone]       The results of significant diagnostics from this hospitalization (including imaging, microbiology, ancillary and laboratory) are listed below for reference.    Significant Diagnostic Studies: Dg Chest 2 View  12/29/2013   CLINICAL DATA:  Shortness of breath for 6 hr. History of cardiac tumor, CHF.  EXAM: CHEST  2 VIEW  COMPARISON:  Chest radiograph November 15, 2011  FINDINGS: The cardiac silhouette is moderately enlarged, unchanged. Mediastinal silhouette is nonsuspicious, mildly calcified aortic knob. Status post median sternotomy for CABG and mitral valve replacement. Mild chronic interstitial changes, increased lung volumes consistent with COPD. No pleural effusion or focal consolidation. Large air-filled hiatal hernia. No pneumothorax.  Patient is kyphotic, osteopenic. Chronic single mid thoracic severe compression deformity.  IMPRESSION: Stable cardiomegaly, COPD.  No superimposed acute pulmonary process.  Large hiatal hernia.   Electronically Signed   By: Elon Alas   On: 12/29/2013 01:17    Labs: Basic Metabolic Panel:  Recent Labs Lab 12/29/13 0041  NA 136  K 3.6  CL 104  CO2 23  GLUCOSE 115*  BUN  9  CREATININE 0.71  CALCIUM 9.3   Liver Function Tests:  Recent Labs Lab 12/29/13 0041  AST 28  ALT 13  ALKPHOS 70  BILITOT 0.4  PROT 7.6  ALBUMIN 4.2   CBC:  Recent Labs Lab 12/29/13 0041  WBC 13.0*  HGB 10.1*  HCT 34.0*  MCV 73.9*  PLT 239   Cardiac Enzymes:  Recent Labs Lab 12/29/13 0344 12/29/13 0931  TROPONINI <0.03 <0.03    Signed:  Barton Dubois  [facility] 12/29/2013, 12:48 PM

## 2013-12-29 NOTE — ED Provider Notes (Signed)
CSN: [id number]     Arrival date & time 12/29/13  0022 History   First MD Initiated Contact with Patient 12/29/13 0031     Chief Complaint  Patient presents with  . Shortness of Breath     (Consider location/radiation/quality/duration/timing/severity/associated sxs/prior Treatment) HPI Victoria Sexton is a 80 y.o. female with past medical history of mitral valve prolapse, hypertension, PVC, coronary artery disease, CHF presenting today with shortness of breath. This occurred around 7 PM of sudden onset. Patient continues to have an increased respiratory rate. She denies any chest pain, recent fevers, recent cough. She has had congestion over the last couple of days. Last time this occurred the patient had fluid on her lungs that she was concerned that this is the case again. She denies any long distance travel or history of blood clots. Nothing makes the symptoms better or worse. Patient has no further complaints.  10 Systems reviewed and are negative for acute change except as noted in the HPI.    Past Medical History  Diagnosis Date  . Hypertension   . MVP (mitral valve prolapse)   . Osteoporosis   . Inguinal hernia right  . Mitral regurgitation due to cusp prolapse   . Cardiac tumor, ventricular     LVOT  . PVC (premature ventricular contraction)   . Hiatal hernia   . Coronary artery disease 10/04/2011  . Congestive heart failure 10/04/2011  . Heart murmur   . Shortness of breath   . Anginal pain   . Hernia, hiatal     Left wears a hernia belt  . Skin cancer 2013    Face.  . S/P mitral valve repair 10/12/2011    Complex valvuloplasty including triangular resection of flail posterior leaflet with artificial Goretex neocord placement x2 and 26 mm Sorin Memo 3D ring annuloplasty  . S/P CABG x 3 10/12/2011    LIMA to LAD, SVG to OM, SVG to RCA, EVH via left thigh and leg  . Thoracic compression fracture   . Hx of CABG     -LIMA to LAD,SVG to RCA, SVG to OM, mitral valve repair,  patent foramen ovale closure, benign-apearing mass removal.  . Aortic atherosclerosis 10/25/2012    Upper aortic atherosclerosis seen on CT scan 08/13/2002  . Mild malnutrition 10/25/2012   Past Surgical History  Procedure Laterality Date  . Abdominal hysterectomy    . Leg fx repair      Right  . Hemorroidectomy    . Tee without cardioversion  11/13/2010    Procedure: TRANSESOPHAGEAL ECHOCARDIOGRAM (TEE);  Surgeon: Candee Furbish, MD;  Location[facility] ENDOSCOPY;  Service: Cardiovascular;  Laterality: N/A;  . Skin cancer excision  2013    not melanoma  . Hemorrhoid surgery    . Mitral valve repair  10/12/2011    Procedure: MITRAL VALVE REPAIR (MVR);  Surgeon: Rexene Alberts, MD;  Location: [place];  Service: Open Heart Surgery;  Laterality: N/A;  MITRAL VALVE REPAIR  . Coronary artery bypass graft  10/12/2011    Procedure: CORONARY ARTERY BYPASS GRAFTING (CABG);  Surgeon: Rexene Alberts, MD;  Location: [place];  Service: Open Heart Surgery;  Laterality: N/A;  RESECTION OF LV MASS  . Patent foramen ovale closure  10/12/2011    Procedure: PATENT FORAMEN OVALE CLOSURE;  Surgeon: Rexene Alberts, MD;  Location: [place];  Service: Open Heart Surgery;  Laterality: N/A;  . Left and right heart catheterization with coronary angiogram N/A 09/29/2011    Procedure: LEFT  AND RIGHT HEART CATHETERIZATION WITH CORONARY ANGIOGRAM;  Surgeon: Candee Furbish, MD;  Location: [facility] CATH LAB;  Service: Cardiovascular;  Laterality: N/A;   Family History  Problem Relation Age of Onset  . Heart attack Mother   . Lung cancer Father    History  Substance Use Topics  . Smoking status: Former Smoker -- 0.50 packs/day for 30 years    Types: Cigarettes    Quit date: 11/13/2010  . Smokeless tobacco: Not on file  . Alcohol Use: No   OB History    No data available     Review of Systems    Allergies  Hydrocodone-acetaminophen; Penicillins; Cyclobenzaprine; and Sulfa antibiotics  Home Medications   Prior to Admission  medications   Medication Sig Start Date End Date Taking? Authorizing Provider  aspirin 81 MG tablet Take 81 mg by mouth daily.     Historical Provider, MD  co-enzyme Q-10 30 MG capsule Take 30 mg by mouth daily.     Historical Provider, MD  lisinopril (PRINIVIL,ZESTRIL) 5 MG tablet TAKE 1 TABLET ONCE DAILY.    Candee Furbish, MD  metoprolol succinate (TOPROL-XL) 100 MG 24 hr tablet TAKE 1 TABLET ONCE DAILY. 11/16/13   Candee Furbish, MD  multivitamin-lutein [facility]) CAPS Take 1 capsule by mouth daily. 6MG     Historical Provider, MD  OVER THE COUNTER MEDICATION Take 1 tablet by mouth daily. Omega Red    Historical Provider, MD  simvastatin (ZOCOR) 5 MG tablet TAKE 1 TABLET ONCE DAILY. 10/17/13   Candee Furbish, MD   BP 185/75 mmHg  Pulse 90  Temp(Src) 97.9 F (36.6 C)  Resp 22  SpO2 100% Physical Exam  Constitutional: She is oriented to person, place, and time. No distress.  HENT:  Head: Normocephalic and atraumatic.  Nose: Nose normal.  Mouth/Throat: Oropharynx is clear and moist. No oropharyngeal exudate.  Eyes: Conjunctivae and EOM are normal. Pupils are equal, round, and reactive to light. No scleral icterus.  Neck: Normal range of motion. Neck supple. No JVD present. No tracheal deviation present. No thyromegaly present.  Cardiovascular: Normal rate, regular rhythm and normal heart sounds.  Exam reveals no gallop and no friction rub.   No murmur heard. Irregularly irregular rhythm  Pulmonary/Chest: Effort normal and breath sounds normal. No respiratory distress. She has no wheezes. She exhibits no tenderness.  Tachypnea noted  Abdominal: Soft. Bowel sounds are normal. She exhibits no distension and no mass. There is no tenderness. There is no rebound and no guarding.  Musculoskeletal: Normal range of motion. She exhibits no edema or tenderness.  Lymphadenopathy:    She has no cervical adenopathy.  Neurological: She is alert and oriented to person, place, and time. No cranial nerve  deficit. She exhibits normal muscle tone.  Skin: Skin is warm and dry. No rash noted. She is not diaphoretic. No erythema. No pallor.  Nursing note and vitals reviewed.   ED Course  Procedures (including critical care time) Labs Review Labs Reviewed  CBC - Abnormal; Notable for the following:    WBC 13.0 (*)    Hemoglobin 10.1 (*)    HCT 34.0 (*)    MCV 73.9 (*)    MCH 22.0 (*)    MCHC 29.7 (*)    RDW 20.4 (*)    All other components within normal limits  COMPREHENSIVE METABOLIC PANEL - Abnormal; Notable for the following:    Glucose, Bld 115 (*)    GFR calc non Af Amer 77 (*)    GFR  calc Af Amer 89 (*)    All other components within normal limits  BLOOD GAS, ARTERIAL - Abnormal; Notable for the following:    pH, Arterial 7.467 (*)    pCO2 arterial 32.3 (*)    pO2, Arterial 65.7 (*)    All other components within normal limits  TROPONIN I  TROPONIN I  TROPONIN I  I-STAT TROPOININ, ED  I-STAT TROPOININ, ED    Imaging Review Dg Chest 2 View  12/29/2013   CLINICAL DATA:  Shortness of breath for 6 hr. History of cardiac tumor, CHF.  EXAM: CHEST  2 VIEW  COMPARISON:  Chest radiograph November 15, 2011  FINDINGS: The cardiac silhouette is moderately enlarged, unchanged. Mediastinal silhouette is nonsuspicious, mildly calcified aortic knob. Status post median sternotomy for CABG and mitral valve replacement. Mild chronic interstitial changes, increased lung volumes consistent with COPD. No pleural effusion or focal consolidation. Large air-filled hiatal hernia. No pneumothorax.  Patient is kyphotic, osteopenic. Chronic single mid thoracic severe compression deformity.  IMPRESSION: Stable cardiomegaly, COPD.  No superimposed acute pulmonary process.  Large hiatal hernia.   Electronically Signed   By: Elon Alas   On: 12/29/2013 01:17     EKG Interpretation   Date/Time:  Saturday December 29 2013 00:35:12 EST Ventricular Rate:  91 PR Interval:  176 QRS Duration: 90 QT  Interval:  349 QTC Calculation: 429 R Axis:   90 Text Interpretation:  Sinus rhythm Premature ventricular complexes  Probable left atrial enlargement Anterior infarct, old Minimal ST  depression, lateral leads No significant change since last tracing  Confirmed by Glynn Octave [phone]) on 12/29/2013 1:02:19 AM      MDM   Final diagnoses:  None    Patient presents emergency department out of concern for shortness of breath. Will evaluate with cardiac workup as well as chest x-ray for possible pneumonia. Other than tachypnea, physical exam is unremarkable. Patient will need to be retained in the hospital for continued cardiac evaluation.  ABG reveals decreased O2 as well.  She was admitted to telemetry, triad hospitalist.    Everlene Balls, MD 12/29/13 [phone]

## 2013-12-29 NOTE — ED Notes (Signed)
Per pt she became SOB, cannot breathe through her nose.  Per pt's husband the last time pt had sx similar to this she had a CABG.

## 2013-12-31 ENCOUNTER — Telehealth: Payer: Self-pay | Admitting: Cardiology

## 2013-12-31 DIAGNOSIS — I951 Orthostatic hypotension: Secondary | ICD-10-CM | POA: Diagnosis not present

## 2013-12-31 NOTE — Telephone Encounter (Signed)
New message    Patient husband calling    Victoria Sexton to emergency room on Friday was admit .    Felt faint this am went to [facility] to have lab work to be drawn. Dr. Joneen Caraway seen her this am.   No chest pain, no sob.

## 2013-12-31 NOTE — Telephone Encounter (Signed)
Per was seen recently in the hospital.  Had been started on Furosemide and potassium chloride.  She went to her PCP today and was told to stop these d/t orthostatic hypotension.  Pt was given an appointment for f/u with Dr Marlou Porch next Wednesday at 9:45.  They will call back if further issues or concerns.

## 2014-01-03 DIAGNOSIS — D509 Iron deficiency anemia, unspecified: Secondary | ICD-10-CM | POA: Diagnosis not present

## 2014-01-08 ENCOUNTER — Other Ambulatory Visit: Payer: Self-pay | Admitting: Cardiology

## 2014-01-09 ENCOUNTER — Ambulatory Visit (INDEPENDENT_AMBULATORY_CARE_PROVIDER_SITE_OTHER): Payer: Medicare Other | Admitting: Cardiology

## 2014-01-09 ENCOUNTER — Encounter: Payer: Self-pay | Admitting: Cardiology

## 2014-01-09 VITALS — BP 110/68 | HR 69 | Ht 65.0 in | Wt 96.0 lb

## 2014-01-09 DIAGNOSIS — I2583 Coronary atherosclerosis due to lipid rich plaque: Secondary | ICD-10-CM

## 2014-01-09 DIAGNOSIS — Z951 Presence of aortocoronary bypass graft: Secondary | ICD-10-CM | POA: Diagnosis not present

## 2014-01-09 DIAGNOSIS — Z9889 Other specified postprocedural states: Secondary | ICD-10-CM

## 2014-01-09 DIAGNOSIS — I951 Orthostatic hypotension: Secondary | ICD-10-CM

## 2014-01-09 DIAGNOSIS — I2510 Atherosclerotic heart disease of native coronary artery without angina pectoris: Secondary | ICD-10-CM

## 2014-01-09 NOTE — Progress Notes (Signed)
[place]. [address], [place]  [address] Phone: [phone] Fax:  [phone]  Date:  01/09/2014   ID:  Berdene, Askari 03-28-1927, MRN [id number]  PCP:  Vidal Schwalbe, MD   History of Present Illness: Victoria Sexton is a 81 y.o. female female with coronary artery disease status post bypass surgery as well as mitral valve repair by Dr. Roxy Manns on 10/12/11 with LIMA to LAD, SVG to RCA, SVG to obtuse marginal, with patent foramen ovale closure and a benign-appearing endocardial mass removal here for followup.  She was recently hospitalized 12/29/13 with shortness of breath developing the evening, concerned about fluid on lungs like she did with her mitral valve repair. No chest pain. She does admit to eating and drinking significant more fluid and sodium. No edema. She was given IV diuretics and discharged on Lasix 20 mg a day. Echocardiogram was suggested. Low-sodium diet. IMA globin 10.1. Troponin was negative.  Upon follow-up at Dr. Noland Fordyce office, her Lasix and potassium was discontinued. She was having some orthostatic symptoms. Overall she's been feeling well without any further episodes of shortness of breath. Her husband, Ed, Delene Ruffini states that we would not celebrate the holidays because she has broken a bone, went to the hospital over Christmas etc.   She has had aortic atherosclerosis seen previously on CT scan and is currently being treated with statin medications. LDL in the past has been 91.  She has battled with mild malnutrition and is currently taking Ensure supplements. She has always been quite thin. Her husband is a retired Designer, multimedia.   Wt Readings from Last 3 Encounters:  01/09/14 96 lb (43.545 kg)  12/29/13 94 lb 9.2 oz (42.9 kg)  11/16/13 102 lb (46.267 kg)     Past Medical History  Diagnosis Date  . Hypertension   . MVP (mitral valve prolapse)   . Osteoporosis   . Inguinal hernia right  . Mitral regurgitation due to cusp prolapse   . Cardiac  tumor, ventricular     LVOT  . PVC (premature ventricular contraction)   . Hiatal hernia   . Coronary artery disease 10/04/2011  . Congestive heart failure 10/04/2011  . Heart murmur   . Shortness of breath   . Anginal pain   . Hernia, hiatal     Left wears a hernia belt  . Skin cancer 2013    Face.  . S/P mitral valve repair 10/12/2011    Complex valvuloplasty including triangular resection of flail posterior leaflet with artificial Goretex neocord placement x2 and 26 mm Sorin Memo 3D ring annuloplasty  . S/P CABG x 3 10/12/2011    LIMA to LAD, SVG to OM, SVG to RCA, EVH via left thigh and leg  . Thoracic compression fracture   . Hx of CABG     -LIMA to LAD,SVG to RCA, SVG to OM, mitral valve repair, patent foramen ovale closure, benign-apearing mass removal.  . Aortic atherosclerosis 10/25/2012    Upper aortic atherosclerosis seen on CT scan 08/13/2002  . Mild malnutrition 10/25/2012    Past Surgical History  Procedure Laterality Date  . Abdominal hysterectomy    . Leg fx repair      Right  . Hemorroidectomy    . Tee without cardioversion  11/13/2010    Procedure: TRANSESOPHAGEAL ECHOCARDIOGRAM (TEE);  Surgeon: Candee Furbish, MD;  Location: [facility] ENDOSCOPY;  Service: Cardiovascular;  Laterality: N/A;  . Skin cancer excision  2013  not melanoma  . Hemorrhoid surgery    . Mitral valve repair  10/12/2011    Procedure: MITRAL VALVE REPAIR (MVR);  Surgeon: Rexene Alberts, MD;  Location: [place];  Service: Open Heart Surgery;  Laterality: N/A;  MITRAL VALVE REPAIR  . Coronary artery bypass graft  10/12/2011    Procedure: CORONARY ARTERY BYPASS GRAFTING (CABG);  Surgeon: Rexene Alberts, MD;  Location: [place];  Service: Open Heart Surgery;  Laterality: N/A;  RESECTION OF LV MASS  . Patent foramen ovale closure  10/12/2011    Procedure: PATENT FORAMEN OVALE CLOSURE;  Surgeon: Rexene Alberts, MD;  Location: [place];  Service: Open Heart Surgery;  Laterality: N/A;  . Left and right heart  catheterization with coronary angiogram N/A 09/29/2011    Procedure: LEFT AND RIGHT HEART CATHETERIZATION WITH CORONARY ANGIOGRAM;  Surgeon: Candee Furbish, MD;  Location: [facility] CATH LAB;  Service: Cardiovascular;  Laterality: N/A;    Current Outpatient Prescriptions  Medication Sig Dispense Refill  . aspirin 81 MG tablet Take 81 mg by mouth daily.     . Coenzyme Q10 (CO Q-10) 50 MG CAPS Take 50 mg by mouth daily.    [name] ENSURE (ENSURE) Take 237 mLs by mouth daily.    . iron polysaccharides (NIFEREX) 150 MG capsule Take 150 mg by mouth daily.    [name] lisinopril (PRINIVIL,ZESTRIL) 5 MG tablet TAKE 1 TABLET ONCE DAILY. 90 tablet 1  . metoprolol succinate (TOPROL-XL) 50 MG 24 hr tablet Take 1 tablet (50 mg total) by mouth 2 (two) times daily. Take with or immediately following a meal. 60 tablet 1  . multivitamin-lutein (OCUVITE-LUTEIN) CAPS Take 1 capsule by mouth daily. 6MG     . OVER THE COUNTER MEDICATION Take 1 tablet by mouth daily. Omega Red    . simvastatin (ZOCOR) 5 MG tablet TAKE 1 TABLET ONCE DAILY. 90 tablet 3   No current facility-administered medications for this visit.    Allergies:    Allergies  Allergen Reactions  . Hydrocodone-Acetaminophen Shortness Of Breath  . Penicillins Palpitations  . Cyclobenzaprine Other (See Comments)    Dry mouth  . Sulfa Antibiotics Rash    Social History:  The patient  reports that she quit smoking about 3 years ago. Her smoking use included Cigarettes. She has a 15 pack-year smoking history. She does not have any smokeless tobacco history on file. She reports that she does not drink alcohol or use illicit drugs.   ROS:  Please see the history of present illness.   Denies any syncope, bleeding, orthopnea, PND   All other systems reviewed and negative.   PHYSICAL EXAM: VS:  BP 110/68 mmHg  Pulse 69  Ht 5\' 5"  (1.651 m)  Wt 96 lb (43.545 kg)  BMI 15.98 kg/m2 Thin, elderly, kyphotic, in no acute distress HEENT: normal Neck: no JVD Cardiac:  normal  S1, S2; RRR; no murmur Lungs:  clear to auscultation bilaterally, no wheezing, rhonchi or rales Abd: soft, nontender, no hepatomegaly Ext: no edema Skin: warm and dry Neuro: no focal abnormalities noted  EKG: 11/16/13 Sinus rhythm, 69 bpm, rightward axis, old septal infarct pattern.     ECHO: 10/18/11 (post repair of MV) - Left ventricle: The cavity size was normal. There was severe concentric hypertrophy. The estimated ejection fraction was 55%. Wall motion was normal; there were no regional wall motion abnormalities. There was an increased relative contribution of atrial contraction to ventricular filling. Doppler parameters are consistent with abnormal left ventricular relaxation (grade  1 diastolic dysfunction). - Aortic valve: Moderate thickening and calcification, consistent with sclerosis. - Mitral valve: By mean gradient there appears to be moderate mitral stenosis but MVA by pressure half-time appears normal. Prior procedures included surgical repair. An annular ring prosthesis was present. Moderate thickening, with moderate involvement of chords. - Left atrium: The atrium was mild to moderately dilated.  ASSESSMENT AND PLAN:  1. Orthostatic hypotension-I agree with discontinuation of Lasix and potassium supplement. We can probably utilize this medication on an as-needed basis. If she begins to have shortness of breath again and increased weight, I told her that it would be reasonable to take one of her Lasix pills. She will call us if necessary. Continue with low-sodium diet. Because she is not having any further shortness of breath, I will forego echocardiogram at this time. Postoperative echocardiogram showed normal ejection fraction. If symptoms change, we will check echo. 2. Coronary artery disease-stable, no exertional anginal symptoms. Post bypass. Benign intracardiac mass removed during surgical procedure.  3. Mitral valve repair, doing well,  stable 4. Hypertension-good control.  5. Hyperlipidemia - stable, medications reviewed 6. Mild malnutrition-encourage ensure supplementation. BMI less than 20. 7. Aortic atherosclerosis-continue with secondary prevention, low-dose simvastatin. 8. We will see back in 6 months.  Signed, Candee Furbish, MD [facility]  01/09/2014 10:12 AM

## 2014-01-09 NOTE — Patient Instructions (Signed)
Your physician recommends that you continue on your current medications as directed. Please refer to the Current Medication list given to you today.  Your physician wants you to follow-up in: 6 months with Dr. Skains. You will receive a reminder letter in the mail two months in advance. If you don't receive a letter, please call our office to schedule the follow-up appointment.  

## 2014-02-07 ENCOUNTER — Other Ambulatory Visit: Payer: Self-pay | Admitting: Cardiology

## 2014-03-11 DIAGNOSIS — D509 Iron deficiency anemia, unspecified: Secondary | ICD-10-CM | POA: Diagnosis not present

## 2014-04-11 DIAGNOSIS — D509 Iron deficiency anemia, unspecified: Secondary | ICD-10-CM | POA: Diagnosis not present

## 2014-04-11 DIAGNOSIS — E46 Unspecified protein-calorie malnutrition: Secondary | ICD-10-CM | POA: Diagnosis not present

## 2014-04-11 DIAGNOSIS — I10 Essential (primary) hypertension: Secondary | ICD-10-CM | POA: Diagnosis not present

## 2014-04-16 ENCOUNTER — Inpatient Hospital Stay (HOSPITAL_COMMUNITY)
Admission: EM | Admit: 2014-04-16 | Discharge: 2014-04-20 | DRG: 470 | Disposition: A | Discharge status: Home / Self Care | Payer: Medicare Other | Attending: Internal Medicine | Admitting: Internal Medicine

## 2014-04-16 ENCOUNTER — Encounter (HOSPITAL_COMMUNITY): Payer: Self-pay | Admitting: Emergency Medicine

## 2014-04-16 ENCOUNTER — Emergency Department (HOSPITAL_COMMUNITY): Payer: Medicare Other

## 2014-04-16 ENCOUNTER — Other Ambulatory Visit (HOSPITAL_COMMUNITY): Payer: Self-pay

## 2014-04-16 DIAGNOSIS — Z01811 Encounter for preprocedural respiratory examination: Secondary | ICD-10-CM | POA: Diagnosis not present

## 2014-04-16 DIAGNOSIS — S72001A Fracture of unspecified part of neck of right femur, initial encounter for closed fracture: Principal | ICD-10-CM

## 2014-04-16 DIAGNOSIS — Z7982 Long term (current) use of aspirin: Secondary | ICD-10-CM | POA: Diagnosis not present

## 2014-04-16 DIAGNOSIS — Z952 Presence of prosthetic heart valve: Secondary | ICD-10-CM

## 2014-04-16 DIAGNOSIS — E441 Mild protein-calorie malnutrition: Secondary | ICD-10-CM | POA: Diagnosis present

## 2014-04-16 DIAGNOSIS — I10 Essential (primary) hypertension: Secondary | ICD-10-CM | POA: Diagnosis present

## 2014-04-16 DIAGNOSIS — S72001S Fracture of unspecified part of neck of right femur, sequela: Secondary | ICD-10-CM | POA: Diagnosis not present

## 2014-04-16 DIAGNOSIS — R0602 Shortness of breath: Secondary | ICD-10-CM | POA: Diagnosis not present

## 2014-04-16 DIAGNOSIS — Z888 Allergy status to other drugs, medicaments and biological substances status: Secondary | ICD-10-CM | POA: Diagnosis not present

## 2014-04-16 DIAGNOSIS — K5900 Constipation, unspecified: Secondary | ICD-10-CM | POA: Diagnosis present

## 2014-04-16 DIAGNOSIS — E876 Hypokalemia: Secondary | ICD-10-CM | POA: Diagnosis not present

## 2014-04-16 DIAGNOSIS — Z954 Presence of other heart-valve replacement: Secondary | ICD-10-CM | POA: Diagnosis not present

## 2014-04-16 DIAGNOSIS — Z9889 Other specified postprocedural states: Secondary | ICD-10-CM | POA: Diagnosis not present

## 2014-04-16 DIAGNOSIS — I5032 Chronic diastolic (congestive) heart failure: Secondary | ICD-10-CM | POA: Diagnosis present

## 2014-04-16 DIAGNOSIS — M810 Age-related osteoporosis without current pathological fracture: Secondary | ICD-10-CM | POA: Diagnosis present

## 2014-04-16 DIAGNOSIS — Z882 Allergy status to sulfonamides status: Secondary | ICD-10-CM | POA: Diagnosis not present

## 2014-04-16 DIAGNOSIS — M25551 Pain in right hip: Secondary | ICD-10-CM | POA: Diagnosis not present

## 2014-04-16 DIAGNOSIS — Z681 Body mass index (BMI) 19 or less, adult: Secondary | ICD-10-CM | POA: Diagnosis not present

## 2014-04-16 DIAGNOSIS — Z87891 Personal history of nicotine dependence: Secondary | ICD-10-CM | POA: Diagnosis not present

## 2014-04-16 DIAGNOSIS — Z880 Allergy status to penicillin: Secondary | ICD-10-CM

## 2014-04-16 DIAGNOSIS — I509 Heart failure, unspecified: Secondary | ICD-10-CM | POA: Diagnosis not present

## 2014-04-16 DIAGNOSIS — Z471 Aftercare following joint replacement surgery: Secondary | ICD-10-CM | POA: Diagnosis not present

## 2014-04-16 DIAGNOSIS — Z96641 Presence of right artificial hip joint: Secondary | ICD-10-CM

## 2014-04-16 DIAGNOSIS — Z951 Presence of aortocoronary bypass graft: Secondary | ICD-10-CM

## 2014-04-16 DIAGNOSIS — M25559 Pain in unspecified hip: Secondary | ICD-10-CM

## 2014-04-16 DIAGNOSIS — I2510 Atherosclerotic heart disease of native coronary artery without angina pectoris: Secondary | ICD-10-CM | POA: Diagnosis not present

## 2014-04-16 DIAGNOSIS — R918 Other nonspecific abnormal finding of lung field: Secondary | ICD-10-CM | POA: Diagnosis not present

## 2014-04-16 DIAGNOSIS — E785 Hyperlipidemia, unspecified: Secondary | ICD-10-CM | POA: Diagnosis present

## 2014-04-16 DIAGNOSIS — Z01810 Encounter for preprocedural cardiovascular examination: Secondary | ICD-10-CM | POA: Diagnosis not present

## 2014-04-16 DIAGNOSIS — W010XXA Fall on same level from slipping, tripping and stumbling without subsequent striking against object, initial encounter: Secondary | ICD-10-CM | POA: Diagnosis present

## 2014-04-16 DIAGNOSIS — S72009A Fracture of unspecified part of neck of unspecified femur, initial encounter for closed fracture: Secondary | ICD-10-CM | POA: Diagnosis present

## 2014-04-16 DIAGNOSIS — K449 Diaphragmatic hernia without obstruction or gangrene: Secondary | ICD-10-CM | POA: Diagnosis present

## 2014-04-16 DIAGNOSIS — Z90710 Acquired absence of both cervix and uterus: Secondary | ICD-10-CM | POA: Diagnosis not present

## 2014-04-16 DIAGNOSIS — Z85828 Personal history of other malignant neoplasm of skin: Secondary | ICD-10-CM

## 2014-04-16 DIAGNOSIS — S72041A Displaced fracture of base of neck of right femur, initial encounter for closed fracture: Secondary | ICD-10-CM | POA: Diagnosis not present

## 2014-04-16 DIAGNOSIS — R52 Pain, unspecified: Secondary | ICD-10-CM | POA: Diagnosis not present

## 2014-04-16 LAB — BASIC METABOLIC PANEL
Anion gap: 8 (ref 5–15)
BUN: 9 mg/dL (ref 6–23)
CO2: 25 mmol/L (ref 19–32)
Calcium: 8.6 mg/dL (ref 8.4–10.5)
Chloride: 102 mmol/L (ref 96–112)
Creatinine, Ser: 0.61 mg/dL (ref 0.50–1.10)
GFR calc Af Amer: >90 mL/min (ref >90)
GFR calc non Af Amer: 81 mL/min — ABNORMAL LOW (ref >90)
GLUCOSE: 108 mg/dL — AB (ref 70–99)
POTASSIUM: 3.5 mmol/L (ref 3.5–5.1)
SODIUM: 135 mmol/L (ref 135–145)

## 2014-04-16 LAB — TYPE AND SCREEN
ABO/RH(D): O NEG
Antibody Screen: NEGATIVE

## 2014-04-16 LAB — CBC WITH DIFFERENTIAL/PLATELET
BASOS ABS: 0 10*3/uL (ref 0.0–0.1)
Basophils Relative: 0 % (ref 0–1)
EOS ABS: 0 10*3/uL (ref 0.0–0.7)
Eosinophils Relative: 0 % (ref 0–5)
HCT: 37.5 % (ref 36.0–46.0)
Hemoglobin: 11.5 g/dL — ABNORMAL LOW (ref 12.0–15.0)
Lymphocytes Relative: 26 % (ref 12–46)
Lymphs Abs: 2.8 10*3/uL (ref 0.7–4.0)
MCH: 25.5 pg — AB (ref 26.0–34.0)
MCHC: 30.7 g/dL (ref 30.0–36.0)
MCV: 83.1 fL (ref 78.0–100.0)
MONOS PCT: 3 % (ref 3–12)
Monocytes Absolute: 0.4 10*3/uL (ref 0.1–1.0)
NEUTROS PCT: 71 % (ref 43–77)
Neutro Abs: 7.8 10*3/uL — ABNORMAL HIGH (ref 1.7–7.7)
Platelets: 168 10*3/uL (ref 150–400)
RBC: 4.51 MIL/uL (ref 3.87–5.11)
RDW: 16.9 % — AB (ref 11.5–15.5)
WBC: 11 10*3/uL — ABNORMAL HIGH (ref 4.0–10.5)

## 2014-04-16 LAB — PROTIME-INR
INR: 1 (ref 0.00–1.49)
Prothrombin Time: 13.3 seconds (ref 11.6–15.2)

## 2014-04-16 LAB — ABO/RH: ABO/RH(D): O NEG

## 2014-04-16 MED ORDER — METOPROLOL SUCCINATE ER 100 MG PO TB24
50.0000 mg | ORAL_TABLET | Freq: Two times a day (BID) | ORAL | Status: DC
  Administered 2014-04-16 – 2014-04-17 (×2): 50 mg via ORAL
  Filled 2014-04-16 (×3): qty 1

## 2014-04-16 MED ORDER — FERROUS SULFATE 325 (65 FE) MG PO TABS
325.0000 mg | ORAL_TABLET | Freq: Three times a day (TID) | ORAL | Status: DC
  Administered 2014-04-18 – 2014-04-20 (×7): 325 mg via ORAL
  Filled 2014-04-16 (×11): qty 1

## 2014-04-16 MED ORDER — METHOCARBAMOL 1000 MG/10ML IJ SOLN
500.0000 mg | Freq: Four times a day (QID) | INTRAVENOUS | Status: DC | PRN
  Filled 2014-04-16: qty 5

## 2014-04-16 MED ORDER — DOCUSATE SODIUM 100 MG PO CAPS
100.0000 mg | ORAL_CAPSULE | Freq: Two times a day (BID) | ORAL | Status: DC
  Administered 2014-04-16: 100 mg via ORAL
  Filled 2014-04-16: qty 1

## 2014-04-16 MED ORDER — FENTANYL CITRATE 0.05 MG/ML IJ SOLN
50.0000 ug | INTRAMUSCULAR | Status: DC | PRN
  Administered 2014-04-16: 50 ug via INTRAVENOUS
  Filled 2014-04-16: qty 2

## 2014-04-16 MED ORDER — LISINOPRIL 5 MG PO TABS
5.0000 mg | ORAL_TABLET | Freq: Two times a day (BID) | ORAL | Status: DC
  Administered 2014-04-16 – 2014-04-20 (×7): 5 mg via ORAL
  Filled 2014-04-16 (×4): qty 1

## 2014-04-16 MED ORDER — BISACODYL 10 MG RE SUPP
10.0000 mg | Freq: Every day | RECTAL | Status: DC | PRN
Start: 2014-04-16 — End: 2014-04-20

## 2014-04-16 MED ORDER — HYDROMORPHONE HCL 1 MG/ML IJ SOLN: 0.5000 mg | INTRAMUSCULAR | Status: DC | PRN

## 2014-04-16 MED ORDER — OXYCODONE HCL 5 MG PO TABS
5.0000 mg | ORAL_TABLET | ORAL | Status: DC | PRN
  Administered 2014-04-18: 10 mg via ORAL
  Administered 2014-04-19 – 2014-04-20 (×2): 5 mg via ORAL
  Filled 2014-04-16: qty 2
  Filled 2014-04-16 (×2): qty 1

## 2014-04-16 MED ORDER — METHOCARBAMOL 500 MG PO TABS: 500.0000 mg | ORAL_TABLET | Freq: Four times a day (QID) | ORAL | Status: DC | PRN

## 2014-04-16 MED ORDER — SIMVASTATIN 5 MG PO TABS
5.0000 mg | ORAL_TABLET | Freq: Every day | ORAL | Status: DC
  Administered 2014-04-16 – 2014-04-19 (×3): 5 mg via ORAL
  Filled 2014-04-16 (×2): qty 1

## 2014-04-16 MED ORDER — POLYETHYLENE GLYCOL 3350 17 G PO PACK: 17.0000 g | PACK | Freq: Every day | ORAL | Status: DC | PRN

## 2014-04-16 MED ORDER — POLYSACCHARIDE IRON COMPLEX 150 MG PO CAPS: 150.0000 mg | ORAL_CAPSULE | Freq: Every day | ORAL | Status: DC

## 2014-04-16 NOTE — Progress Notes (Signed)
Clinical Social Work Department BRIEF PSYCHOSOCIAL ASSESSMENT 04/16/2014  Patient:  Victoria Sexton, Victoria Sexton     Account Number:  [id number]     Admit date:  04/16/2014  Clinical Social Worker:  Tilda Burrow, CLINICAL SOCIAL WORKER  Date/Time:  04/16/2014 09:56 AM  Referred by:  CSW  Date Referred:   Referred for  Other - See comment   Other Referral:   Interview type:  Family Other interview type:   Patient was the only person. However, her pastor was present during the interview.    PSYCHOSOCIAL DATA Living Status:  HUSBAND Admitted from facility:   Level of care:   Primary support name:  Ed Primary support relationship to patient:  SPOUSE Degree of support available:   Patient states her husband is her primary support. Patient states that she lives at home with her husband in [place].    CURRENT CONCERNS Current Concerns  Adjustment to Illness   Other Concerns:    SOCIAL WORK ASSESSMENT / PLAN CSW met with pt at bedside. Her Doristine Bosworth was present. Patient confirms that she fell while at church today. She states she is not sure how she fell, but a witnessed informed her that she fell due to her foot getting stuck in a chair.    Patient informed CSW that she lives at home in [place] with her husband. She states that she completes her ADL's independently. Patient denies falling often and says that besides this incident she has not fallen within 6 months.    Patient and pastor state that her husband is a primary support. Doristine Bosworth informed CSW that the pt's husband is a good caregiver. Patient states that she is not interested in a facility at this time.    Patient informed CSW that she feels better than when she initially came into [facility].    Patient states that she does not have any questions.    Husband/Ed  H [phone]                       C [phone]   Assessment/plan status:  No Further Intervention Required Other assessment/ plan:   Patient informed CSW that she  would like to go home and is not interested in going to a facility. There are no other social work needs at this time.   Information/referral to community resources:    PATIENT'S/FAMILY'S RESPONSE TO PLAN OF CARE: Patient is determined that she is not interested in a facility. Husband was not present during the interview. Her pastor was present at bedside during the interview for support.      Willette Brace [phone] ED CSW 04/16/2014 10:19 PM

## 2014-04-16 NOTE — ED Notes (Signed)
Attempted to call report twice on patient. No nurse available to take report.

## 2014-04-16 NOTE — ED Notes (Signed)
Bed: QJ33 Expected date:  Expected time:  Means of arrival:  Comments: EMS- 81yo F, fall, hip pain

## 2014-04-16 NOTE — ED Notes (Signed)
Per EMS they were contacted d/t fall at church. Pt reports tripping over something falling over onto right side hip, deformity noted per ems, pt unable to lay hip flat. Fentanl 50 mcg given 20 G RAC. 163/75, 80, PVCs with hx bi

## 2014-04-16 NOTE — ED Provider Notes (Signed)
CSN: [id number]     Arrival date & time 04/16/14  1653 History   First MD Initiated Contact with Patient 04/16/14 1743     Chief Complaint  Patient presents with  . Fall  . Hip Pain     (Consider location/radiation/quality/duration/timing/severity/associated sxs/prior Treatment) Patient is a 81 y.o. female presenting with leg pain.  Leg Pain Location:  Hip Time since incident: shortly prior to arrival. Injury: yes   Mechanism of injury: fall   Fall:    Fall occurred: tripped over a chair leg.    Impact surface:  Hard floor   Point of impact:  Buttocks Hip location:  R hip Pain details:    Quality:  Sharp   Radiates to:  Does not radiate   Severity:  Moderate   Onset quality:  Sudden   Timing:  Constant   Progression:  Unchanged Relieved by:  Rest (Fentanyl) Exacerbated by: Movement. Associated symptoms: no back pain, no muscle weakness, no neck pain, no swelling and no tingling     Past Medical History  Diagnosis Date  . Hypertension   . MVP (mitral valve prolapse)   . Osteoporosis   . Inguinal hernia right  . Mitral regurgitation due to cusp prolapse   . Cardiac tumor, ventricular     LVOT  . PVC (premature ventricular contraction)   . Hiatal hernia   . Coronary artery disease 10/04/2011  . Congestive heart failure 10/04/2011  . Heart murmur   . Shortness of breath   . Anginal pain   . Hernia, hiatal     Left wears a hernia belt  . Skin cancer 2013    Face.  . S/P mitral valve repair 10/12/2011    Complex valvuloplasty including triangular resection of flail posterior leaflet with artificial Goretex neocord placement x2 and 26 mm Sorin Memo 3D ring annuloplasty  . S/P CABG x 3 10/12/2011    LIMA to LAD, SVG to OM, SVG to RCA, EVH via left thigh and leg  . Thoracic compression fracture   . Hx of CABG     -LIMA to LAD,SVG to RCA, SVG to OM, mitral valve repair, patent foramen ovale closure, benign-apearing mass removal.  . Aortic atherosclerosis 10/25/2012   Upper aortic atherosclerosis seen on CT scan 08/13/2002  . Mild malnutrition 10/25/2012   Past Surgical History  Procedure Laterality Date  . Abdominal hysterectomy    . Leg fx repair      Right  . Hemorroidectomy    . Tee without cardioversion  11/13/2010    Procedure: TRANSESOPHAGEAL ECHOCARDIOGRAM (TEE);  Surgeon: Candee Furbish, MD;  Location: [facility] ENDOSCOPY;  Service: Cardiovascular;  Laterality: N/A;  . Skin cancer excision  2013    not melanoma  . Hemorrhoid surgery    . Mitral valve repair  10/12/2011    Procedure: MITRAL VALVE REPAIR (MVR);  Surgeon: Rexene Alberts, MD;  Location: [place];  Service: Open Heart Surgery;  Laterality: N/A;  MITRAL VALVE REPAIR  . Coronary artery bypass graft  10/12/2011    Procedure: CORONARY ARTERY BYPASS GRAFTING (CABG);  Surgeon: Rexene Alberts, MD;  Location: [place];  Service: Open Heart Surgery;  Laterality: N/A;  RESECTION OF LV MASS  . Patent foramen ovale closure  10/12/2011    Procedure: PATENT FORAMEN OVALE CLOSURE;  Surgeon: Rexene Alberts, MD;  Location: [place];  Service: Open Heart Surgery;  Laterality: N/A;  . Left and right heart catheterization with coronary angiogram N/A 09/29/2011  Procedure: LEFT AND RIGHT HEART CATHETERIZATION WITH CORONARY ANGIOGRAM;  Surgeon: Candee Furbish, MD;  Location: [facility] CATH LAB;  Service: Cardiovascular;  Laterality: N/A;   Family History  Problem Relation Age of Onset  . Heart attack Mother   . Lung cancer Father    History  Substance Use Topics  . Smoking status: Former Smoker -- 0.50 packs/day for 30 years    Types: Cigarettes    Quit date: 11/13/2010  . Smokeless tobacco: Not on file  . Alcohol Use: No   OB History    No data available     Review of Systems  Musculoskeletal: Negative for back pain and neck pain.  All other systems reviewed and are negative.     Allergies  Hydrocodone-acetaminophen; Penicillins; Cyclobenzaprine; and Sulfa antibiotics  Home Medications   Prior to Admission  medications   Medication Sig Start Date End Date Taking? Authorizing Provider  amoxicillin (AMOXIL) 500 MG capsule TAKE 4 CAPSULES ONE HOUR PRIOR TO DENTAL PROCEDURE. 02/08/14  Yes Jerline Pain, MD  aspirin 81 MG tablet Take 81 mg by mouth daily.    Yes Historical Provider, MD  Coenzyme Q10 (CO Q-10) 50 MG CAPS Take 50 mg by mouth daily.   Yes Historical Provider, MD  ENSURE (ENSURE) Take 237 mLs by mouth 2 (two) times daily between meals.    Yes Historical Provider, MD  lisinopril (PRINIVIL,ZESTRIL) 5 MG tablet TAKE 1 TABLET ONCE DAILY. Patient taking differently: TAKE 1 TABLET twice daily 01/09/14  Yes Jerline Pain, MD  metoprolol succinate (TOPROL-XL) 50 MG 24 hr tablet Take 1 tablet (50 mg total) by mouth 2 (two) times daily. Take with or immediately following a meal. 12/29/13  Yes Barton Dubois, MD  multivitamin-lutein [facility]) CAPS Take 1 capsule by mouth daily. 6MG    Yes Historical Provider, MD  OVER THE COUNTER MEDICATION Take 1 tablet by mouth daily. Omega Red   Yes Historical Provider, MD  simvastatin (ZOCOR) 5 MG tablet TAKE 1 TABLET ONCE DAILY. 10/17/13  Yes Jerline Pain, MD  iron polysaccharides (NIFEREX) 150 MG capsule Take 150 mg by mouth daily.    Historical Provider, MD   BP 161/76 mmHg  Pulse 82  Temp(Src) 97.7 F (36.5 C) (Oral)  Resp 18  SpO2 97% Physical Exam  Constitutional: She is oriented to person, place, and time. She appears well-developed and well-nourished. No distress.  HENT:  Head: Normocephalic and atraumatic. Head is without raccoon's eyes and without Battle's sign.  Nose: Nose normal.  Eyes: Conjunctivae and EOM are normal. Pupils are equal, round, and reactive to light. No scleral icterus.  Neck: No spinous process tenderness and no muscular tenderness present.  Cardiovascular: Normal rate, regular rhythm, normal heart sounds and intact distal pulses.   No murmur heard. Pulmonary/Chest: Effort normal and breath sounds normal. She has no rales.  She exhibits no tenderness.  Abdominal: Soft. There is no tenderness. There is no rebound and no guarding.  Musculoskeletal: She exhibits no edema or tenderness.       Right hip: She exhibits decreased range of motion (Pain with range of motion. Neurovascularly intact distally.). She exhibits no deformity.       Thoracic back: She exhibits no tenderness and no bony tenderness.       Lumbar back: She exhibits no tenderness and no bony tenderness.  No evidence of trauma to extremities, except as noted.  2+ distal pulses.    Neurological: She is alert and oriented to person, place, and  time.  Skin: Skin is warm and dry. No rash noted.  Psychiatric: She has a normal mood and affect.  Nursing note and vitals reviewed.   ED Course  Procedures (including critical care time) Labs Review Labs Reviewed  BASIC METABOLIC PANEL - Abnormal; Notable for the following:    Glucose, Bld 108 (*)    GFR calc non Af Amer 81 (*)    All other components within normal limits  CBC WITH DIFFERENTIAL/PLATELET - Abnormal; Notable for the following:    WBC 11.0 (*)    Hemoglobin 11.5 (*)    MCH 25.5 (*)    RDW 16.9 (*)    Neutro Abs 7.8 (*)    All other components within normal limits  PROTIME-INR  CBC  BASIC METABOLIC PANEL  TYPE AND SCREEN  ABO/RH  TYPE AND SCREEN    Imaging Review Dg Hip Unilat  With Pelvis 2-3 Views Right  04/16/2014   CLINICAL DATA:  Hip pain.  Fall onto right hip.  EXAM: RIGHT HIP (WITH PELVIS) 2-3 VIEWS  COMPARISON:  None.  FINDINGS: There is a mildly impacted femoral neck fracture on the right. No subluxation or dislocation. Diffuse osteopenia. Left hip joint and SI joints are unremarkable.  IMPRESSION: Mildly impacted right femoral neck fracture.   Electronically Signed   By: Rolm Baptise M.D.   On: 04/16/2014 18:21  All radiology studies independently viewed by me.      EKG Interpretation None      MDM   Final diagnoses:  Hip pain  Femoral neck fracture, right, closed,  initial encounter    Right femoral neck fracture after mechanical fall. Patient declined pain medications. Plan ortho consult and hospitalist admission.  7:43 PM discussed with Dr. Marcelino Scot (orthopedics). He will consult. Tentatively plan surgery for tomorrow.  Medicine have admitted.   Serita Grit, MD 04/17/14 [phone]

## 2014-04-16 NOTE — H&P (Signed)
[facility] History and Physical  JETT KULZER QQV:[id number] DOB: 1927-11-24 DOA: 04/16/2014  Referring physician: Serita Grit, MD PCP: Vidal Schwalbe, MD   Chief Complaint: Right femoral Neck Fracture  HPI: Victoria Sexton is a 81 y.o. female presents with a hip fracture. She was at a blood drive and tripped over a chair leg while getting some water for a donor. She states that she fell on her right side and felt pain. She was unable to ambulate afterwards. Patient was found to have a femoral neck fracture here in the ED. She does have a history of CHF CAD and also has a history of HTN. Patient has no chest pain no shortness of breath. She has no current chest pain. She did not lose consciousness after the fall. She has no nausea or vomiting. Her husband states that she has been treated for iron deficiency in the past.    Review of Systems:  Constitutional:  +weight loss, no night sweats, Fevers, chills, fatigue.  HEENT:  No headaches, No sneezing, itching, ear ache, nasal congestion, post nasal drip,  Cardio-vascular:  No chest pain, Orthopnea, PND, swelling in lower extremities, anasarca, dizziness, palpitations  GI:  No heartburn, indigestion, abdominal pain, nausea, vomiting, diarrhea, change in bowel habits, loss of appetite  Resp:  No shortness of breath with exertion or at rest. No coughing up of blood.No change in color of mucus.No wheezing Skin:  no rash or lesions.  GU:  no dysuria, change in color of urine, no urgency or frequency Musculoskeletal:  No joint pain or swelling. No decreased range of motion  Psych:  No change in mood or affect. No depression or anxiety   Past Medical History  Diagnosis Date  . Hypertension   . MVP (mitral valve prolapse)   . Osteoporosis   . Inguinal hernia right  . Mitral regurgitation due to cusp prolapse   . Cardiac tumor, ventricular     LVOT  . PVC (premature ventricular contraction)   . Hiatal hernia   . Coronary artery  disease 10/04/2011  . Congestive heart failure 10/04/2011  . Heart murmur   . Shortness of breath   . Anginal pain   . Hernia, hiatal     Left wears a hernia belt  . Skin cancer 2013    Face.  . S/P mitral valve repair 10/12/2011    Complex valvuloplasty including triangular resection of flail posterior leaflet with artificial Goretex neocord placement x2 and 26 mm Sorin Memo 3D ring annuloplasty  . S/P CABG x 3 10/12/2011    LIMA to LAD, SVG to OM, SVG to RCA, EVH via left thigh and leg  . Thoracic compression fracture   . Hx of CABG     -LIMA to LAD,SVG to RCA, SVG to OM, mitral valve repair, patent foramen ovale closure, benign-apearing mass removal.  . Aortic atherosclerosis 10/25/2012    Upper aortic atherosclerosis seen on CT scan 08/13/2002  . Mild malnutrition 10/25/2012   Past Surgical History  Procedure Laterality Date  . Abdominal hysterectomy    . Leg fx repair      Right  . Hemorroidectomy    . Tee without cardioversion  11/13/2010    Procedure: TRANSESOPHAGEAL ECHOCARDIOGRAM (TEE);  Surgeon: Candee Furbish, MD;  Location: [facility] ENDOSCOPY;  Service: Cardiovascular;  Laterality: N/A;  . Skin cancer excision  2013    not melanoma  . Hemorrhoid surgery    . Mitral valve repair  10/12/2011    Procedure: MITRAL  VALVE REPAIR (MVR);  Surgeon: Rexene Alberts, MD;  Location: [place];  Service: Open Heart Surgery;  Laterality: N/A;  MITRAL VALVE REPAIR  . Coronary artery bypass graft  10/12/2011    Procedure: CORONARY ARTERY BYPASS GRAFTING (CABG);  Surgeon: Rexene Alberts, MD;  Location: [place];  Service: Open Heart Surgery;  Laterality: N/A;  RESECTION OF LV MASS  . Patent foramen ovale closure  10/12/2011    Procedure: PATENT FORAMEN OVALE CLOSURE;  Surgeon: Rexene Alberts, MD;  Location: [place];  Service: Open Heart Surgery;  Laterality: N/A;  . Left and right heart catheterization with coronary angiogram N/A 09/29/2011    Procedure: LEFT AND RIGHT HEART CATHETERIZATION WITH CORONARY  ANGIOGRAM;  Surgeon: Candee Furbish, MD;  Location: [facility] CATH LAB;  Service: Cardiovascular;  Laterality: N/A;   Social History:  reports that she quit smoking about 3 years ago. Her smoking use included Cigarettes. She has a 15 pack-year smoking history. She has never used smokeless tobacco. She reports that she does not drink alcohol or use illicit drugs.  Allergies  Allergen Reactions  . Hydrocodone-Acetaminophen Shortness Of Breath  . Penicillins Palpitations  . Cyclobenzaprine Other (See Comments)    Dry mouth  . Sulfa Antibiotics Rash    Family History  Problem Relation Age of Onset  . Heart attack Mother   . Lung cancer Father      Prior to Admission medications   Medication Sig Start Date End Date Taking? Authorizing Provider  amoxicillin (AMOXIL) 500 MG capsule TAKE 4 CAPSULES ONE HOUR PRIOR TO DENTAL PROCEDURE. 02/08/14  Yes Jerline Pain, MD  aspirin 81 MG tablet Take 81 mg by mouth daily.    Yes Historical Provider, MD  Coenzyme Q10 (CO Q-10) 50 MG CAPS Take 50 mg by mouth daily.   Yes Historical Provider, MD  ENSURE (ENSURE) Take 237 mLs by mouth 2 (two) times daily between meals.    Yes Historical Provider, MD  lisinopril (PRINIVIL,ZESTRIL) 5 MG tablet TAKE 1 TABLET ONCE DAILY. Patient taking differently: TAKE 1 TABLET twice daily 01/09/14  Yes Jerline Pain, MD  metoprolol succinate (TOPROL-XL) 50 MG 24 hr tablet Take 1 tablet (50 mg total) by mouth 2 (two) times daily. Take with or immediately following a meal. 12/29/13  Yes Barton Dubois, MD  multivitamin-lutein [facility]) CAPS Take 1 capsule by mouth daily. 6MG    Yes Historical Provider, MD  OVER THE COUNTER MEDICATION Take 1 tablet by mouth daily. Omega Red   Yes Historical Provider, MD  simvastatin (ZOCOR) 5 MG tablet TAKE 1 TABLET ONCE DAILY. 10/17/13  Yes Jerline Pain, MD  iron polysaccharides (NIFEREX) 150 MG capsule Take 150 mg by mouth daily.    Historical Provider, MD   Physical Exam: Filed Vitals:    04/16/14 1700  BP: 161/76  Pulse: 82  Temp: 97.7 F (36.5 C)  TempSrc: Oral  Resp: 18  SpO2: 97%    Wt Readings from Last 3 Encounters:  01/09/14 43.545 kg (96 lb)  12/29/13 42.9 kg (94 lb 9.2 oz)  11/16/13 46.267 kg (102 lb)    General:  Appears calm and comfortable Eyes: PERRL, normal lids, irises & conjunctiva ENT: grossly normal hearing, lips & tongue Neck: no LAD, masses or thyromegaly Cardiovascular: RRR, no m/r/g. No LE edema. Respiratory: CTA bilaterally, no w/r/r. Normal respiratory effort. Abdomen: soft, ntnd Skin: no rash or induration seen on limited exam Musculoskeletal: grossly normal tone limited exam Psychiatric: grossly normal mood and affect  Neurologic: grossly non-focal.          Labs on Admission:  Basic Metabolic Panel:  Recent Labs Lab 04/16/14 1920  NA 135  K 3.5  CL 102  CO2 25  GLUCOSE 108*  BUN 9  CREATININE 0.61  CALCIUM 8.6   Liver Function Tests: No results for input(s): AST, ALT, ALKPHOS, BILITOT, PROT, ALBUMIN in the last 168 hours. No results for input(s): LIPASE, AMYLASE in the last 168 hours. No results for input(s): AMMONIA in the last 168 hours. CBC:  Recent Labs Lab 04/16/14 1920  WBC 11.0*  NEUTROABS 7.8*  HGB 11.5*  HCT 37.5  MCV 83.1  PLT 168   Cardiac Enzymes: No results for input(s): CKTOTAL, CKMB, CKMBINDEX, TROPONINI in the last 168 hours.  BNP (last 3 results) No results for input(s): BNP in the last 8760 hours.  ProBNP (last 3 results) No results for input(s): PROBNP in the last 8760 hours.  CBG: No results for input(s): GLUCAP in the last 168 hours.  Radiological Exams on Admission: Dg Chest 1 View  04/16/2014   CLINICAL DATA:  Preop hip fracture. History of CABG and hypertension.  EXAM: CHEST  1 VIEW  COMPARISON:  12/29/2013  FINDINGS: Prior CABG. Large hiatal hernia. Heart is borderline in size. Diffuse interstitial prominence throughout the lungs, increased since prior study. No effusions or  acute bony abnormality.  IMPRESSION: Increasing diffuse interstitial prominence throughout the lungs which could reflect chronic interstitial lung disease. It is difficult to completely exclude superimposed edema.   Electronically Signed   By: Rolm Baptise M.D.   On: 04/16/2014 19:54   Dg Hip Unilat  With Pelvis 2-3 Views Right  04/16/2014   CLINICAL DATA:  Hip pain.  Fall onto right hip.  EXAM: RIGHT HIP (WITH PELVIS) 2-3 VIEWS  COMPARISON:  None.  FINDINGS: There is a mildly impacted femoral neck fracture on the right. No subluxation or dislocation. Diffuse osteopenia. Left hip joint and SI joints are unremarkable.  IMPRESSION: Mildly impacted right femoral neck fracture.   Electronically Signed   By: Rolm Baptise M.D.   On: 04/16/2014 18:21      Assessment/Plan Principal Problem:   Fracture of femoral neck, right Active Problems:   Essential hypertension, benign   Congestive heart failure   Femoral neck fracture   1. Femoral Neck Fracture Right side -will be admitted to [place] consult ordered -will keep NPO for now -pain control  2. Hypertension -will continue with home medications -will monitor pressures  3. CHF -currently compensated  4. Hyperglycemia -stress related  -will check A1C -monnitor FSBS     Code Status: Full Code (must indicate code status--if unknown or must be presumed, indicate so) DVT Prophylaxis:SCD Family Communication: None (indicate person spoken with, if applicable, with phone number if by telephone) Disposition Plan: SNF (indicate anticipated LOS)  Time spent: 28min  KHAN,SAADAT A [facility] Pager [phone]

## 2014-04-16 NOTE — Progress Notes (Signed)
CSW met with pt at bedside. Her Doristine Bosworth was present. Patient confirms that she fell while at church today. She states she is not sure how she fell, but a witnessed informed her that she fell due to her foot getting stuck in a chair.   Patient informed CSW that she lives at home in [place] with her husband. She states that she completes her ADL's independently. Patient denies falling often and says that besides this incident she has not fallen within 6 months.  Patient and pastor state that her husband is a primary support. Doristine Bosworth informed CSW that the pt's husband is a good caregiver. Patient states that she is not interested in a facility at this time.  Patient informed CSW that she feels better than when she initially came into [facility].  Patient states that she does not have any questions.  Husband/Ed  H ([phone]                      C [phone]   Willette Brace [phone] ED CSW 04/16/2014 6:03 PM

## 2014-04-17 ENCOUNTER — Inpatient Hospital Stay (HOSPITAL_COMMUNITY): Payer: Medicare Other | Admitting: Anesthesiology

## 2014-04-17 ENCOUNTER — Encounter (HOSPITAL_COMMUNITY): Payer: Self-pay | Admitting: Anesthesiology

## 2014-04-17 ENCOUNTER — Encounter (HOSPITAL_COMMUNITY)
Admission: EM | Disposition: A | Discharge status: Home / Self Care | Payer: Self-pay | Source: Home / Self Care | Attending: Internal Medicine

## 2014-04-17 ENCOUNTER — Inpatient Hospital Stay (HOSPITAL_COMMUNITY): Payer: Medicare Other

## 2014-04-17 DIAGNOSIS — S72001S Fracture of unspecified part of neck of right femur, sequela: Secondary | ICD-10-CM

## 2014-04-17 DIAGNOSIS — Z01810 Encounter for preprocedural cardiovascular examination: Secondary | ICD-10-CM

## 2014-04-17 DIAGNOSIS — Z9889 Other specified postprocedural states: Secondary | ICD-10-CM

## 2014-04-17 HISTORY — PX: HIP ARTHROPLASTY: SHX981

## 2014-04-17 LAB — BASIC METABOLIC PANEL
ANION GAP: 9 (ref 5–15)
BUN: 11 mg/dL (ref 6–23)
CO2: 24 mmol/L (ref 19–32)
CREATININE: 0.61 mg/dL (ref 0.50–1.10)
Calcium: 8.6 mg/dL (ref 8.4–10.5)
Chloride: 102 mmol/L (ref 96–112)
GFR calc Af Amer: >90 mL/min (ref >90)
GFR, EST NON AFRICAN AMERICAN: 81 mL/min — AB (ref >90)
Glucose, Bld: 130 mg/dL — ABNORMAL HIGH (ref 70–99)
Potassium: 3.6 mmol/L (ref 3.5–5.1)
SODIUM: 135 mmol/L (ref 135–145)

## 2014-04-17 LAB — CBC
HEMATOCRIT: 35.4 % — AB (ref 36.0–46.0)
HEMOGLOBIN: 11.1 g/dL — AB (ref 12.0–15.0)
MCH: 25.9 pg — ABNORMAL LOW (ref 26.0–34.0)
MCHC: 31.4 g/dL (ref 30.0–36.0)
MCV: 82.5 fL (ref 78.0–100.0)
Platelets: 182 10*3/uL (ref 150–400)
RBC: 4.29 MIL/uL (ref 3.87–5.11)
RDW: 17.1 % — AB (ref 11.5–15.5)
WBC: 14 10*3/uL — ABNORMAL HIGH (ref 4.0–10.5)

## 2014-04-17 LAB — URINALYSIS, ROUTINE W REFLEX MICROSCOPIC
Bilirubin Urine: NEGATIVE
GLUCOSE, UA: NEGATIVE mg/dL
Ketones, ur: 40 mg/dL — AB
LEUKOCYTES UA: NEGATIVE
Nitrite: NEGATIVE
Protein, ur: NEGATIVE mg/dL
Specific Gravity, Urine: 1.02 (ref 1.005–1.030)
Urobilinogen, UA: 0.2 mg/dL (ref 0.0–1.0)
pH: 6 (ref 5.0–8.0)

## 2014-04-17 LAB — SURGICAL PCR SCREEN
MRSA, PCR: NEGATIVE
Staphylococcus aureus: NEGATIVE

## 2014-04-17 LAB — URINE MICROSCOPIC-ADD ON

## 2014-04-17 SURGERY — HEMIARTHROPLASTY, HIP, DIRECT ANTERIOR APPROACH, FOR FRACTURE
Anesthesia: General | Site: Hip | Laterality: Right

## 2014-04-17 MED ORDER — CEFAZOLIN SODIUM-DEXTROSE 2-3 GM-% IV SOLR
INTRAVENOUS | Status: AC
  Filled 2014-04-17: qty 50

## 2014-04-17 MED ORDER — LACTATED RINGERS IV SOLN
INTRAVENOUS | Status: DC | PRN
  Administered 2014-04-17: 20:00:00 via INTRAVENOUS

## 2014-04-17 MED ORDER — METOCLOPRAMIDE HCL 10 MG PO TABS: 5.0000 mg | ORAL_TABLET | Freq: Three times a day (TID) | ORAL | Status: DC | PRN

## 2014-04-17 MED ORDER — HYDROMORPHONE HCL 1 MG/ML IJ SOLN: 0.2500 mg | INTRAMUSCULAR | Status: DC | PRN

## 2014-04-17 MED ORDER — TRAMADOL HCL 50 MG PO TABS
50.0000 mg | ORAL_TABLET | Freq: Four times a day (QID) | ORAL | Status: DC | PRN
  Administered 2014-04-18: 100 mg via ORAL
  Filled 2014-04-17: qty 2

## 2014-04-17 MED ORDER — LIDOCAINE HCL (CARDIAC) 20 MG/ML IV SOLN
INTRAVENOUS | Status: AC
  Filled 2014-04-17: qty 5

## 2014-04-17 MED ORDER — SODIUM CHLORIDE 0.9 % IV SOLN
INTRAVENOUS | Status: DC
  Administered 2014-04-17: 23:00:00 via INTRAVENOUS
  Filled 2014-04-17 (×2): qty 1000

## 2014-04-17 MED ORDER — ALUM & MAG HYDROXIDE-SIMETH 200-200-20 MG/5ML PO SUSP: 30.0000 mL | ORAL | Status: DC | PRN

## 2014-04-17 MED ORDER — PHENOL 1.4 % MT LIQD
1.0000 | OROMUCOSAL | Status: DC | PRN
  Filled 2014-04-17: qty 177

## 2014-04-17 MED ORDER — FENTANYL CITRATE 0.05 MG/ML IJ SOLN
INTRAMUSCULAR | Status: AC
  Filled 2014-04-17: qty 5

## 2014-04-17 MED ORDER — PROPOFOL 10 MG/ML IV EMUL
INTRAVENOUS | Status: DC | PRN
  Administered 2014-04-17: 20 mg via INTRAVENOUS

## 2014-04-17 MED ORDER — AMIODARONE LOAD VIA INFUSION
150.0000 mg | Freq: Once | INTRAVENOUS | Status: AC
  Administered 2014-04-17: 150 mg via INTRAVENOUS
  Filled 2014-04-17: qty 83.34

## 2014-04-17 MED ORDER — MENTHOL 3 MG MT LOZG
1.0000 | LOZENGE | OROMUCOSAL | Status: DC | PRN
  Filled 2014-04-17: qty 9

## 2014-04-17 MED ORDER — DOCUSATE SODIUM 100 MG PO CAPS
100.0000 mg | ORAL_CAPSULE | Freq: Two times a day (BID) | ORAL | Status: DC
  Administered 2014-04-17 – 2014-04-20 (×6): 100 mg via ORAL
  Filled 2014-04-17 (×5): qty 1

## 2014-04-17 MED ORDER — ONDANSETRON HCL 4 MG/2ML IJ SOLN
4.0000 mg | Freq: Once | INTRAMUSCULAR | Status: AC | PRN
Start: 2014-04-17 — End: 2014-04-17

## 2014-04-17 MED ORDER — 0.9 % SODIUM CHLORIDE (POUR BTL) OPTIME
TOPICAL | Status: DC | PRN
  Administered 2014-04-17: 1000 mL

## 2014-04-17 MED ORDER — ONDANSETRON HCL 4 MG/2ML IJ SOLN
4.0000 mg | Freq: Four times a day (QID) | INTRAMUSCULAR | Status: DC | PRN
  Administered 2014-04-18: 4 mg via INTRAVENOUS
  Filled 2014-04-17: qty 2

## 2014-04-17 MED ORDER — POLYETHYLENE GLYCOL 3350 17 G PO PACK: 17.0000 g | PACK | Freq: Every day | ORAL | Status: DC | PRN

## 2014-04-17 MED ORDER — HYDROMORPHONE HCL 1 MG/ML IJ SOLN: 0.5000 mg | INTRAMUSCULAR | Status: DC | PRN

## 2014-04-17 MED ORDER — PHENYLEPHRINE HCL 10 MG/ML IJ SOLN
INTRAMUSCULAR | Status: DC | PRN
  Administered 2014-04-17: 40 ug via INTRAVENOUS

## 2014-04-17 MED ORDER — CEFAZOLIN SODIUM 1-5 GM-% IV SOLN
1.0000 g | Freq: Four times a day (QID) | INTRAVENOUS | Status: AC
  Administered 2014-04-18 (×2): 1 g via INTRAVENOUS
  Filled 2014-04-17 (×2): qty 50

## 2014-04-17 MED ORDER — ASPIRIN EC 325 MG PO TBEC
325.0000 mg | DELAYED_RELEASE_TABLET | Freq: Two times a day (BID) | ORAL | Status: DC
  Administered 2014-04-18 – 2014-04-20 (×6): 325 mg via ORAL
  Filled 2014-04-17 (×7): qty 1

## 2014-04-17 MED ORDER — ONDANSETRON HCL 4 MG PO TABS: 4.0000 mg | ORAL_TABLET | Freq: Four times a day (QID) | ORAL | Status: DC | PRN

## 2014-04-17 MED ORDER — ONDANSETRON HCL 4 MG/2ML IJ SOLN
INTRAMUSCULAR | Status: AC
  Filled 2014-04-17: qty 2

## 2014-04-17 MED ORDER — CEFAZOLIN SODIUM-DEXTROSE 2-3 GM-% IV SOLR
2.0000 g | Freq: Once | INTRAVENOUS | Status: AC
  Administered 2014-04-17: 2 g via INTRAVENOUS

## 2014-04-17 MED ORDER — PHENYLEPHRINE HCL 10 MG/ML IJ SOLN
10.0000 mg | INTRAVENOUS | Status: DC | PRN
  Administered 2014-04-17: 5 ug/min via INTRAVENOUS

## 2014-04-17 MED ORDER — SODIUM CHLORIDE 0.9 % IV SOLN
INTRAVENOUS | Status: DC
  Administered 2014-04-17: 09:00:00 via INTRAVENOUS

## 2014-04-17 MED ORDER — METOCLOPRAMIDE HCL 5 MG/ML IJ SOLN: 5.0000 mg | Freq: Three times a day (TID) | INTRAMUSCULAR | Status: DC | PRN

## 2014-04-17 MED ORDER — AMIODARONE HCL IN DEXTROSE 360-4.14 MG/200ML-% IV SOLN
30.0000 mg/h | INTRAVENOUS | Status: DC
  Administered 2014-04-17 – 2014-04-18 (×2): 30 mg/h via INTRAVENOUS
  Filled 2014-04-17 (×4): qty 200

## 2014-04-17 MED ORDER — AMIODARONE HCL IN DEXTROSE 360-4.14 MG/200ML-% IV SOLN
60.0000 mg/h | INTRAVENOUS | Status: AC
  Administered 2014-04-17: 60 mg/h via INTRAVENOUS
  Filled 2014-04-17: qty 200

## 2014-04-17 MED ORDER — ROCURONIUM BROMIDE 100 MG/10ML IV SOLN
INTRAVENOUS | Status: AC
  Filled 2014-04-17: qty 1

## 2014-04-17 MED ORDER — METOPROLOL TARTRATE 25 MG PO TABS
25.0000 mg | ORAL_TABLET | Freq: Two times a day (BID) | ORAL | Status: DC
  Administered 2014-04-17 – 2014-04-20 (×6): 25 mg via ORAL
  Filled 2014-04-17 (×7): qty 1

## 2014-04-17 MED ORDER — PROPOFOL INFUSION 10 MG/ML OPTIME
INTRAVENOUS | Status: DC | PRN
  Administered 2014-04-17: 50 ug/kg/min via INTRAVENOUS

## 2014-04-17 MED ORDER — PROPOFOL 10 MG/ML IV BOLUS
INTRAVENOUS | Status: AC
  Filled 2014-04-17: qty 20

## 2014-04-17 SURGICAL SUPPLY — 49 items
BAG SPEC THK2 15X12 ZIP CLS (MISCELLANEOUS) ×1
BAG ZIPLOCK 12X15 (MISCELLANEOUS) ×3 IMPLANT
BLADE SAW SGTL 18X1.27X75 (BLADE) ×2 IMPLANT
BLADE SAW SGTL 18X1.27X75MM (BLADE) ×1
CAPT HIP HEMI 1 ×3 IMPLANT
CLOSURE WOUND 1/2 X4 (GAUZE/BANDAGES/DRESSINGS)
DRAPE INCISE IOBAN 85X60 (DRAPES) ×3 IMPLANT
DRAPE ORTHO SPLIT 77X108 STRL (DRAPES) ×6
DRAPE POUCH INSTRU U-SHP 10X18 (DRAPES) ×3 IMPLANT
DRAPE SURG 17X11 SM STRL (DRAPES) ×3 IMPLANT
DRAPE SURG ORHT 6 SPLT 77X108 (DRAPES) ×2 IMPLANT
DRAPE U-SHAPE 47X51 STRL (DRAPES) ×3 IMPLANT
DRSG AQUACEL AG ADV 3.5X10 (GAUZE/BANDAGES/DRESSINGS) ×3 IMPLANT
DRSG TEGADERM 4X4.75 (GAUZE/BANDAGES/DRESSINGS) IMPLANT
DURAPREP 26ML APPLICATOR (WOUND CARE) ×3 IMPLANT
ELECT BLADE TIP CTD 4 INCH (ELECTRODE) ×3 IMPLANT
ELECT REM PT RETURN 9FT ADLT (ELECTROSURGICAL) ×3
ELECTRODE REM PT RTRN 9FT ADLT (ELECTROSURGICAL) ×1 IMPLANT
EVACUATOR 1/8 PVC DRAIN (DRAIN) IMPLANT
FACESHIELD WRAPAROUND (MASK) ×12 IMPLANT
GAUZE SPONGE 2X2 8PLY STRL LF (GAUZE/BANDAGES/DRESSINGS) IMPLANT
GLOVE BIOGEL PI IND STRL 7.5 (GLOVE) ×1 IMPLANT
GLOVE BIOGEL PI IND STRL 8.5 (GLOVE) ×1 IMPLANT
GLOVE BIOGEL PI INDICATOR 7.5 (GLOVE) ×2
GLOVE BIOGEL PI INDICATOR 8.5 (GLOVE) ×2
GLOVE ECLIPSE 8.0 STRL XLNG CF (GLOVE) IMPLANT
GLOVE ORTHO TXT STRL SZ7.5 (GLOVE) ×6 IMPLANT
GLOVE SURG ORTHO 8.0 STRL STRW (GLOVE) ×3 IMPLANT
GOWN SPEC L3 XXLG W/TWL (GOWN DISPOSABLE) ×3 IMPLANT
GOWN STRL REUS W/TWL LRG LVL3 (GOWN DISPOSABLE) ×3 IMPLANT
HANDPIECE INTERPULSE COAX TIP (DISPOSABLE) ×3
IMMOBILIZER KNEE 20 (SOFTGOODS)
IMMOBILIZER KNEE 20 THIGH 36 (SOFTGOODS) IMPLANT
KIT BASIN OR (CUSTOM PROCEDURE TRAY) ×3 IMPLANT
LIQUID BAND (GAUZE/BANDAGES/DRESSINGS) ×3 IMPLANT
MANIFOLD NEPTUNE II (INSTRUMENTS) ×3 IMPLANT
PACK TOTAL JOINT (CUSTOM PROCEDURE TRAY) ×3 IMPLANT
POSITIONER SURGICAL ARM (MISCELLANEOUS) ×3 IMPLANT
SET HNDPC FAN SPRY TIP SCT (DISPOSABLE) ×1 IMPLANT
SPONGE GAUZE 2X2 STER 10/PKG (GAUZE/BANDAGES/DRESSINGS)
STRIP CLOSURE SKIN 1/2X4 (GAUZE/BANDAGES/DRESSINGS) IMPLANT
SUT ETHIBOND NAB CT1 #1 30IN (SUTURE) ×3 IMPLANT
SUT MNCRL AB 4-0 PS2 18 (SUTURE) ×3 IMPLANT
SUT VIC AB 1 CT1 36 (SUTURE) ×6 IMPLANT
SUT VIC AB 2-0 CT1 27 (SUTURE) ×6
SUT VIC AB 2-0 CT1 TAPERPNT 27 (SUTURE) ×2 IMPLANT
SUT VLOC 180 0 24IN GS25 (SUTURE) ×3 IMPLANT
TOWEL OR 17X26 10 PK STRL BLUE (TOWEL DISPOSABLE) ×6 IMPLANT
TRAY FOLEY CATH 14FRSI W/METER (CATHETERS) IMPLANT

## 2014-04-17 NOTE — Interval H&P Note (Signed)
History and Physical Interval Note:  04/17/2014 8:00 PM  Victoria Sexton  has presented today for surgery, with the diagnosis of right neck fracture  The various methods of treatment have been discussed with the patient and family. After consideration of risks, benefits and other options for treatment, the patient has consented to  Procedure(s): ARTHROPLASTY BIPOLAR HIP (Right) as a surgical intervention .  The patient's history has been reviewed, patient examined, no change in status, stable for surgery.  I have reviewed the patient's chart and labs.  Questions were answered to the patient's satisfaction.     Mauri Pole

## 2014-04-17 NOTE — Anesthesia Postprocedure Evaluation (Signed)
  Anesthesia Post-op Note  Patient: Victoria Sexton  Procedure(s) Performed: Procedure(s): ARTHROPLASTY BIPOLAR HIP (Right)  Patient Location: PACU  Anesthesia Type:Spinal  Level of Consciousness: oriented, sedated and patient cooperative  Airway and Oxygen Therapy: Patient Spontanous Breathing  Post-op Pain: none  Post-op Assessment: Post-op Vital signs reviewed, Patient's Cardiovascular Status Stable, Respiratory Function Stable, Patent Airway, No signs of Nausea or vomiting and Pain level controlled  Post-op Vital Signs: stable  Last Vitals:  Filed Vitals:   04/17/14 1755  BP: 156/70  Pulse: 87  Temp:   Resp:     Complications: No apparent anesthesia complications

## 2014-04-17 NOTE — Anesthesia Preprocedure Evaluation (Addendum)
Anesthesia Evaluation  Patient identified by MRN, date of birth, ID band Patient awake    Reviewed: Allergy & Precautions, NPO status , Patient's Chart, lab work & pertinent test results  Airway Mallampati: II     Mouth opening: Limited Mouth Opening  Dental   Pulmonary former smoker,    Pulmonary exam normal       Cardiovascular hypertension, + angina + CAD, + CABG, + Peripheral Vascular Disease and +CHF + dysrhythmias Supra Ventricular Tachycardia + Valvular Problems/Murmurs MR Rhythm:Regular Rate:Normal     Neuro/Psych    GI/Hepatic hiatal hernia,   Endo/Other    Renal/GU      Musculoskeletal   Abdominal   Peds  Hematology   Anesthesia Other Findings   Reproductive/Obstetrics                          Anesthesia Physical Anesthesia Plan  ASA: III and emergent  Anesthesia Plan: Spinal and MAC   Post-op Pain Management:    Induction: Intravenous  Airway Management Planned: Nasal Cannula  Additional Equipment:   Intra-op Plan:   Post-operative Plan:   Informed Consent: I have reviewed the patients History and Physical, chart, labs and discussed the procedure including the risks, benefits and alternatives for the proposed anesthesia with the patient or authorized representative who has indicated his/her understanding and acceptance.     Plan Discussed with: CRNA, Anesthesiologist and Surgeon  Anesthesia Plan Comments:       Anesthesia Quick Evaluation

## 2014-04-17 NOTE — Consult Note (Signed)
Reason for Consult:  Right hip fracture Referring Physician:   ED Physician  Victoria Sexton is an 81 y.o. female.  HPI:   Patient presented to the ER with a right hip fracture. She was at a blood drive at her [employer] and tripped over a chair leg while getting some water for a donor. She states that she fell on her right side and felt pain. She was unable to ambulate afterwards. Patient was found to have a femoral neck fracture here in the ED. She does have a history of CHF CAD and also has a history of HTN. Patient has no chest pain no shortness of breath. She has no current chest pain. She did not lose consciousness after the fall. She has no nausea or vomiting. Her husband states that she has been treated for iron deficiency in the past.    Past Medical History  Diagnosis Date  . Hypertension   . MVP (mitral valve prolapse)   . Osteoporosis   . Inguinal hernia right  . Mitral regurgitation due to cusp prolapse   . Cardiac tumor, ventricular     LVOT  . PVC (premature ventricular contraction)   . Hiatal hernia   . Coronary artery disease 10/04/2011  . Congestive heart failure 10/04/2011  . Heart murmur   . Shortness of breath   . Anginal pain   . Hernia, hiatal     Left wears a hernia belt  . Skin cancer 2013    Face.  . S/P mitral valve repair 10/12/2011    Complex valvuloplasty including triangular resection of flail posterior leaflet with artificial Goretex neocord placement x2 and 26 mm Sorin Memo 3D ring annuloplasty  . S/P CABG x 3 10/12/2011    LIMA to LAD, SVG to OM, SVG to RCA, EVH via left thigh and leg  . Thoracic compression fracture   . Hx of CABG     -LIMA to LAD,SVG to RCA, SVG to OM, mitral valve repair, patent foramen ovale closure, benign-apearing mass removal.  . Aortic atherosclerosis 10/25/2012    Upper aortic atherosclerosis seen on CT scan 08/13/2002  . Mild malnutrition 10/25/2012    Past Surgical History  Procedure Laterality Date  . Abdominal hysterectomy     . Leg fx repair      Right  . Hemorroidectomy    . Tee without cardioversion  11/13/2010    Procedure: TRANSESOPHAGEAL ECHOCARDIOGRAM (TEE);  Surgeon: Mark Skains, MD;  Location: [facility];  Service: Cardiovascular;  Laterality: N/A;  . Skin cancer excision  2013    not melanoma  . Hemorrhoid surgery    . Mitral valve repair  10/12/2011    Procedure: MITRAL VALVE REPAIR (MVR);  Surgeon: [name] [name], MD;  Location: [facility] OR;  Service: Open Heart Surgery;  Laterality: N/A;  MITRAL VALVE REPAIR  . Coronary artery bypass graft  10/12/2011    Procedure: CORONARY ARTERY BYPASS GRAFTING (CABG);  Surgeon: [name] [name], MD;  Location: [facility] OR;  Service: Open Heart Surgery;  Laterality: N/A;  RESECTION OF LV MASS  . Patent foramen ovale closure  10/12/2011    Procedure: PATENT FORAMEN OVALE CLOSURE;  Surgeon: [name] [name], MD;  Location: [facility] OR;  Service: Open Heart Surgery;  Laterality: N/A;  . Left and right heart catheterization with coronary angiogram N/A 09/29/2011    Procedure: LEFT AND RIGHT HEART CATHETERIZATION WITH CORONARY ANGIOGRAM;  Surgeon: Mark Skains, MD;  Location: [facility]ATH [facility];  Service: Cardiovascular;  Laterality: N/A;      Family History  Problem Relation Age of Onset  . Heart attack Mother   . Lung cancer Father     Social History:  reports that she quit smoking about 3 years ago. Her smoking use included Cigarettes. She has a 15 pack-year smoking history. She has never used smokeless tobacco. She reports that she does not drink alcohol or use illicit drugs.  Allergies:  Allergies  Allergen Reactions  . Hydrocodone-Acetaminophen Anxiety    Pt nervous and shaky per pt husband  . Penicillins Palpitations  . Cyclobenzaprine Other (See Comments)    Dry mouth  . Sulfa Antibiotics Rash     Results for orders placed or performed during the hospital encounter of 04/16/14 (from the past 48 hour(s))  Basic metabolic panel     Status: Abnormal   Collection Time: 04/16/14   7:20 PM  Result Value Ref Range   Sodium 135 135 - 145 mmol/L   Potassium 3.5 3.5 - 5.1 mmol/L   Chloride 102 96 - 112 mmol/L   CO2 25 19 - 32 mmol/L   Glucose, Bld 108 (H) 70 - 99 mg/dL   BUN 9 6 - 23 mg/dL   Creatinine, Ser 0.61 0.50 - 1.10 mg/dL   Calcium 8.6 8.4 - 10.5 mg/dL   GFR calc non Af Amer 81 (L) >90 mL/min   GFR calc Af Amer >90 >90 mL/min    Comment: (NOTE) The eGFR has been calculated using the CKD EPI equation. This calculation has not been validated in all clinical situations. eGFR's persistently <90 mL/min signify possible Chronic Kidney Disease.    Anion gap 8 5 - 15  CBC WITH DIFFERENTIAL     Status: Abnormal   Collection Time: 04/16/14  7:20 PM  Result Value Ref Range   WBC 11.0 (H) 4.0 - 10.5 K/uL   RBC 4.51 3.87 - 5.11 MIL/uL   Hemoglobin 11.5 (L) 12.0 - 15.0 g/dL   HCT 37.5 36.0 - 46.0 %   MCV 83.1 78.0 - 100.0 fL   MCH 25.5 (L) 26.0 - 34.0 pg   MCHC 30.7 30.0 - 36.0 g/dL   RDW 16.9 (H) 11.5 - 15.5 %   Platelets 168 150 - 400 K/uL   Neutrophils Relative % 71 43 - 77 %   Neutro Abs 7.8 (H) 1.7 - 7.7 K/uL   Lymphocytes Relative 26 12 - 46 %   Lymphs Abs 2.8 0.7 - 4.0 K/uL   Monocytes Relative 3 3 - 12 %   Monocytes Absolute 0.4 0.1 - 1.0 K/uL   Eosinophils Relative 0 0 - 5 %   Eosinophils Absolute 0.0 0.0 - 0.7 K/uL   Basophils Relative 0 0 - 1 %   Basophils Absolute 0.0 0.0 - 0.1 K/uL  Protime-INR     Status: None   Collection Time: 04/16/14  7:20 PM  Result Value Ref Range   Prothrombin Time 13.3 11.6 - 15.2 seconds   INR 1.00 0.00 - 1.49  Type and screen     Status: None   Collection Time: 04/16/14  7:21 PM  Result Value Ref Range   ABO/RH(D) O NEG    Antibody Screen NEG    Sample Expiration 04/19/2014   ABO/Rh     Status: None   Collection Time: 04/16/14  7:21 PM  Result Value Ref Range   ABO/RH(D) O NEG   CBC     Status: Abnormal   Collection Time: 04/17/14  4:55 AM  Result Value Ref Range     WBC 14.0 (H) 4.0 - 10.5 K/uL   RBC 4.29  3.87 - 5.11 MIL/uL   Hemoglobin 11.1 (L) 12.0 - 15.0 g/dL   HCT 35.4 (L) 36.0 - 46.0 %   MCV 82.5 78.0 - 100.0 fL   MCH 25.9 (L) 26.0 - 34.0 pg   MCHC 31.4 30.0 - 36.0 g/dL   RDW 17.1 (H) 11.5 - 15.5 %   Platelets 182 150 - 400 K/uL  Basic metabolic panel     Status: Abnormal   Collection Time: 04/17/14  4:55 AM  Result Value Ref Range   Sodium 135 135 - 145 mmol/L   Potassium 3.6 3.5 - 5.1 mmol/L   Chloride 102 96 - 112 mmol/L   CO2 24 19 - 32 mmol/L   Glucose, Bld 130 (H) 70 - 99 mg/dL   BUN 11 6 - 23 mg/dL   Creatinine, Ser 0.61 0.50 - 1.10 mg/dL   Calcium 8.6 8.4 - 10.5 mg/dL   GFR calc non Af Amer 81 (L) >90 mL/min   GFR calc Af Amer >90 >90 mL/min    Comment: (NOTE) The eGFR has been calculated using the CKD EPI equation. This calculation has not been validated in all clinical situations. eGFR's persistently <90 mL/min signify possible Chronic Kidney Disease.    Anion gap 9 5 - 15  Urinalysis, Routine w reflex microscopic     Status: Abnormal   Collection Time: 04/17/14  8:52 AM  Result Value Ref Range   Color, Urine YELLOW YELLOW   APPearance CLEAR CLEAR   Specific Gravity, Urine 1.020 1.005 - 1.030   pH 6.0 5.0 - 8.0   Glucose, UA NEGATIVE NEGATIVE mg/dL   Hgb urine dipstick SMALL (A) NEGATIVE   Bilirubin Urine NEGATIVE NEGATIVE   Ketones, ur 40 (A) NEGATIVE mg/dL   Protein, ur NEGATIVE NEGATIVE mg/dL   Urobilinogen, UA 0.2 0.0 - 1.0 mg/dL   Nitrite NEGATIVE NEGATIVE   Leukocytes, UA NEGATIVE NEGATIVE  Urine microscopic-add on     Status: None   Collection Time: 04/17/14  8:52 AM  Result Value Ref Range   RBC / HPF 3-6 <3 RBC/hpf   Urine-Other MUCOUS PRESENT     Dg Chest 1 View  04/16/2014   CLINICAL DATA:  Preop hip fracture. History of CABG and hypertension.  EXAM: CHEST  1 VIEW  COMPARISON:  12/29/2013  FINDINGS: Prior CABG. Large hiatal hernia. Heart is borderline in size. Diffuse interstitial prominence throughout the lungs, increased since prior study.  No effusions or acute bony abnormality.  IMPRESSION: Increasing diffuse interstitial prominence throughout the lungs which could reflect chronic interstitial lung disease. It is difficult to completely exclude superimposed edema.   Electronically Signed   By: Kevin  Dover M.D.   On: 04/16/2014 19:54   Dg Hip Unilat  With Pelvis 2-3 Views Right  04/16/2014   CLINICAL DATA:  Hip pain.  Fall onto right hip.  EXAM: RIGHT HIP (WITH PELVIS) 2-3 VIEWS  COMPARISON:  None.  FINDINGS: There is a mildly impacted femoral neck fracture on the right. No subluxation or dislocation. Diffuse osteopenia. Left hip joint and SI joints are unremarkable.  IMPRESSION: Mildly impacted right femoral neck fracture.   Electronically Signed   By: Kevin  Dover M.D.   On: 04/16/2014 18:21    Review of Systems  Constitutional: Negative.   HENT: Negative.   Eyes: Negative.   Respiratory: Positive for shortness of breath (on exertion).   Cardiovascular: Negative.   Gastrointestinal: Negative.     Genitourinary: Negative.   Musculoskeletal: Positive for joint pain.  Skin: Negative.   Neurological: Negative.   Endo/Heme/Allergies: Negative.   Psychiatric/Behavioral: Negative.    Blood pressure 141/68, pulse 48, temperature 99.1 F (37.3 C), temperature source Oral, resp. rate 18, height 5' (1.524 m), weight 44.8 kg (98 lb 12.3 oz), SpO2 94 %. Physical Exam  Constitutional: She appears well-developed and well-nourished.  HENT:  Head: Normocephalic.  Eyes: Pupils are equal, round, and reactive to light.  Neck: Neck supple. No JVD present. No tracheal deviation present. No thyromegaly present.  Cardiovascular: Normal rate, regular rhythm and intact distal pulses.   Murmur heard. Respiratory: Effort normal. No stridor. No respiratory distress. She has no wheezes.  GI: Soft. There is no tenderness. There is no guarding.  Musculoskeletal:       Right hip: She exhibits decreased range of motion, decreased strength, tenderness  and bony tenderness.  Lymphadenopathy:    She has no cervical adenopathy.  Neurological: She is alert.  Skin: Skin is warm and dry.  Psychiatric: She has a normal mood and affect.    Assessment/Plan: Right hip fracture   We have discussed with the patient and patient's husband about the need for surgery.  At this time Dr. Olin would like to proceed with a right hip hemiarthroplasty.  We will wait to see if the patient is cleared from both a medical and cardiology standpoint. Will be NPO now and will obtain consent for the surgery.     Leliana Kontz Scott 04/17/2014, 9:49 AM      

## 2014-04-17 NOTE — Progress Notes (Addendum)
Patient Demographics  Victoria Sexton, is a 81 y.o. female, DOB - [birth year], PYP:[id number]  Admit date - 04/16/2014   Admitting Physician Allyne Gee, MD  Outpatient Primary MD for the patient is Vidal Schwalbe, MD  LOS - 1   Chief Complaint  Patient presents with  . Fall  . Hip Pain      Admission history of present illness/brief narrative:  HPI: Victoria Sexton is a 81 y.o. female presents with a hip fracture. She was at a blood drive and tripped over a chair leg while getting some water for a donor. She states that she fell on her right side and felt pain. She was unable to ambulate afterwards. Patient was found to have a femoral neck fracture here in the ED. She does have a history of CHF CAD and also has a history of HTN. Patient has no chest pain no shortness of breath. She has no current chest pain. She did not lose consciousness after the fall. She has no nausea or vomiting. Her husband states that she has been treated for iron deficiency in the past.   Subjective:   Jolly Mango today has, No headache, No chest pain, No abdominal pain - No Nausea, No new weakness tingling or numbness, No Cough - SOB. Complaints of right hip pain.  Assessment & Plan    Principal Problem:   Fracture of femoral neck, right Active Problems:   Essential hypertension, benign   Congestive heart failure   Femoral neck fracture   Preop cardiovascular exam   H/O mitral valve repair  Right hip fracture - Orthopedic consult appreciated, and denies any chest pain, palpitation, shortness of breath, no acute EKG findings, is at moderate risk for orthopedic surgery given her advanced age. - Cardiology input appreciated , continue with beta blockers, received 1 dose. Surgery, will be started on amiodarone drip perioperatively due to risk of PAF. - Check 2-D echo.  Hypertension -  Acceptable on home medication lisinopril and metoprolol  Hyperlipidemia - Continue with statin  Chronic diastolic CHF - Pierce to be compensated, monitor closely.    Code Status: full  Family Communication: Husband at bedside  Disposition Plan: Pending PT evaluation after surgery   Procedures  None   Consults   Orthopedic   cardiology   Medications  Scheduled Meds: . docusate sodium  100 mg Oral BID  . ferrous sulfate  325 mg Oral TID PC  . lisinopril  5 mg Oral BID  . metoprolol succinate  50 mg Oral BID  . simvastatin  5 mg Oral Daily   Continuous Infusions: . sodium chloride 50 mL/hr at 04/17/14 0838   PRN Meds:.bisacodyl, fentaNYL, HYDROmorphone (DILAUDID) injection, methocarbamol **OR** methocarbamol (ROBAXIN)  IV, oxyCODONE, polyethylene glycol  DVT Prophylaxis   SCDs  Lab Results  Component Value Date   PLT 182 04/17/2014    Antibiotics    Anti-infectives    None          Objective:   Filed Vitals:   04/16/14 2213 04/16/14 2238 04/17/14 0616 04/17/14 1333  BP: 151/66 153/72 141/68 130/72  Pulse: 96 101 48 101  Temp:  98.1 F (36.7 C) 99.1 F (37.3 C) 98.3 F (36.8 C)  TempSrc:  Oral  Oral Oral  Resp: 20 20 18 18   Height:  5' (1.524 m)    Weight:  44.8 kg (98 lb 12.3 oz)    SpO2: 93% 93% 94% 97%    Wt Readings from Last 3 Encounters:  04/16/14 44.8 kg (98 lb 12.3 oz)  01/09/14 43.545 kg (96 lb)  12/29/13 42.9 kg (94 lb 9.2 oz)     Intake/Output Summary (Last 24 hours) at 04/17/14 1601 Last data filed at 04/17/14 1334  Gross per 24 hour  Intake      0 ml  Output    350 ml  Net   -350 ml     Physical Exam  Awake Alert, Oriented X 3, No new F.N deficits, Normal affect [place].AT,PERRAL Supple Neck,No JVD, No cervical lymphadenopathy appriciated.  Symmetrical Chest wall movement, Good air movement bilaterally, CTAB RRR,No Gallops,Rubs or new Murmurs, No Parasternal Heave +ve B.Sounds, Abd Soft, No tenderness, No organomegaly  appriciated, No rebound - guarding or rigidity. No Cyanosis, Clubbing or edema, No new Rash or bruise  , good pulses bilaterally.   Data Review   Micro Results No results found for this or any previous visit (from the past 240 hour(s)).  Radiology Reports Dg Chest 1 View  04/16/2014   CLINICAL DATA:  Preop hip fracture. History of CABG and hypertension.  EXAM: CHEST  1 VIEW  COMPARISON:  12/29/2013  FINDINGS: Prior CABG. Large hiatal hernia. Heart is borderline in size. Diffuse interstitial prominence throughout the lungs, increased since prior study. No effusions or acute bony abnormality.  IMPRESSION: Increasing diffuse interstitial prominence throughout the lungs which could reflect chronic interstitial lung disease. It is difficult to completely exclude superimposed edema.   Electronically Signed   By: Rolm Baptise M.D.   On: 04/16/2014 19:54   Dg Hip Unilat  With Pelvis 2-3 Views Right  04/16/2014   CLINICAL DATA:  Hip pain.  Fall onto right hip.  EXAM: RIGHT HIP (WITH PELVIS) 2-3 VIEWS  COMPARISON:  None.  FINDINGS: There is a mildly impacted femoral neck fracture on the right. No subluxation or dislocation. Diffuse osteopenia. Left hip joint and SI joints are unremarkable.  IMPRESSION: Mildly impacted right femoral neck fracture.   Electronically Signed   By: Rolm Baptise M.D.   On: 04/16/2014 18:21    CBC  Recent Labs Lab 04/16/14 1920 04/17/14 0455  WBC 11.0* 14.0*  HGB 11.5* 11.1*  HCT 37.5 35.4*  PLT 168 182  MCV 83.1 82.5  MCH 25.5* 25.9*  MCHC 30.7 31.4  RDW 16.9* 17.1*  LYMPHSABS 2.8  --   MONOABS 0.4  --   EOSABS 0.0  --   BASOSABS 0.0  --     Chemistries   Recent Labs Lab 04/16/14 1920 04/17/14 0455  NA 135 135  K 3.5 3.6  CL 102 102  CO2 25 24  GLUCOSE 108* 130*  BUN 9 11  CREATININE 0.61 0.61  CALCIUM 8.6 8.6   ------------------------------------------------------------------------------------------------------------------ estimated creatinine  clearance is 37 mL/min (by C-G formula based on Cr of 0.61). ------------------------------------------------------------------------------------------------------------------ No results for input(s): HGBA1C in the last 72 hours. ------------------------------------------------------------------------------------------------------------------ No results for input(s): CHOL, HDL, LDLCALC, TRIG, CHOLHDL, LDLDIRECT in the last 72 hours. ------------------------------------------------------------------------------------------------------------------ No results for input(s): TSH, T4TOTAL, T3FREE, THYROIDAB in the last 72 hours.  Invalid input(s): FREET3 ------------------------------------------------------------------------------------------------------------------ No results for input(s): VITAMINB12, FOLATE, FERRITIN, TIBC, IRON, RETICCTPCT in the last 72 hours.  Coagulation profile  Recent Labs Lab 04/16/14 1920  INR 1.00  No results for input(s): DDIMER in the last 72 hours.  Cardiac Enzymes No results for input(s): CKMB, TROPONINI, MYOGLOBIN in the last 168 hours.  Invalid input(s): CK ------------------------------------------------------------------------------------------------------------------ Invalid input(s): POCBNP     Time Spent in minutes   35 minutes   Rashidah Belleville M.D on 04/17/2014 at 4:01 PM  Between 7am to 7pm - Pager - [phone]  After 7pm go to [email or web address] - password [email or web address]  And look for the night coverage person covering for me after hours  [facility] Office  [phone]   **Disclaimer: This note may have been dictated with voice recognition software. Similar sounding words can inadvertently be transcribed and this note may contain transcription errors which may not have been corrected upon publication of note.**

## 2014-04-17 NOTE — Consult Note (Signed)
CARDIOLOGY CONSULT NOTE       Patient ID: Victoria Sexton MRN: [id number] DOB/AGE: 1927/10/27 81 y.o.  Admit date: 04/16/2014 Referring Physician:  Elgergawy Primary Physician: Vidal Schwalbe, MD Primary Cardiologist:  Marlou Porch Reason for Consultation:  Preop  Principal Problem:   Fracture of femoral neck, right Active Problems:   Essential hypertension, benign   Congestive heart failure   Femoral neck fracture   HPI:  Delightful 81 yo admitted with right femoral neck fracture after fall at [address].  She is s/p heart surgery 10/2011 with Dr Roxy Manns.  This involved  MV repair Removal LVOT mass CABG with LIMA to LAD, SVG OM and SVG RCA PFO closure  No MAZE was performed    F/U echo 01/27/12  Showed Normal EF 60-65% with severe basal septal hypertrophy and no significant MR.  She has not had issues since surgery with no PAF, CHF or chest pain Currently with left leg Pain from fracture and waiting to hear from ortho plans for surgery     ROS All other systems reviewed and negative except as noted above  Past Medical History  Diagnosis Date  . Hypertension   . MVP (mitral valve prolapse)   . Osteoporosis   . Inguinal hernia right  . Mitral regurgitation due to cusp prolapse   . Cardiac tumor, ventricular     LVOT  . PVC (premature ventricular contraction)   . Hiatal hernia   . Coronary artery disease 10/04/2011  . Congestive heart failure 10/04/2011  . Heart murmur   . Shortness of breath   . Anginal pain   . Hernia, hiatal     Left wears a hernia belt  . Skin cancer 2013    Face.  . S/P mitral valve repair 10/12/2011    Complex valvuloplasty including triangular resection of flail posterior leaflet with artificial Goretex neocord placement x2 and 26 mm Sorin Memo 3D ring annuloplasty  . S/P CABG x 3 10/12/2011    LIMA to LAD, SVG to OM, SVG to RCA, EVH via left thigh and leg  . Thoracic compression fracture   . Hx of CABG     -LIMA to LAD,SVG to RCA, SVG to OM,  mitral valve repair, patent foramen ovale closure, benign-apearing mass removal.  . Aortic atherosclerosis 10/25/2012    Upper aortic atherosclerosis seen on CT scan 08/13/2002  . Mild malnutrition 10/25/2012    Family History  Problem Relation Age of Onset  . Heart attack Mother   . Lung cancer Father     History   Social History  . Marital Status: Married    Spouse Name: N/A  . Number of Children: N/A  . Years of Education: N/A   Occupational History  . Not on file.   Social History Main Topics  . Smoking status: Former Smoker -- 0.50 packs/day for 30 years    Types: Cigarettes    Quit date: 11/13/2010  . Smokeless tobacco: Never Used  . Alcohol Use: No  . Drug Use: No  . Sexual Activity: Not on file     Comment: did not ask   Other Topics Concern  . Not on file   Social History Narrative    Past Surgical History  Procedure Laterality Date  . Abdominal hysterectomy    . Leg fx repair      Right  . Hemorroidectomy    . Tee without cardioversion  11/13/2010    Procedure: TRANSESOPHAGEAL ECHOCARDIOGRAM (TEE);  Surgeon: Candee Furbish, MD;  Location: [facility];  Service: Cardiovascular;  Laterality: N/A;  . Skin cancer excision  2013    not melanoma  . Hemorrhoid surgery    . Mitral valve repair  10/12/2011    Procedure: MITRAL VALVE REPAIR (MVR);  Surgeon: Rexene Alberts, MD;  Location: [place];  Service: Open Heart Surgery;  Laterality: N/A;  MITRAL VALVE REPAIR  . Coronary artery bypass graft  10/12/2011    Procedure: CORONARY ARTERY BYPASS GRAFTING (CABG);  Surgeon: Rexene Alberts, MD;  Location: [place];  Service: Open Heart Surgery;  Laterality: N/A;  RESECTION OF LV MASS  . Patent foramen ovale closure  10/12/2011    Procedure: PATENT FORAMEN OVALE CLOSURE;  Surgeon: Rexene Alberts, MD;  Location: [place];  Service: Open Heart Surgery;  Laterality: N/A;  . Left and right heart catheterization with coronary angiogram N/A 09/29/2011    Procedure: LEFT AND RIGHT HEART  CATHETERIZATION WITH CORONARY ANGIOGRAM;  Surgeon: Candee Furbish, MD;  Location: [facility] CATH LAB;  Service: Cardiovascular;  Laterality: N/A;     . docusate sodium  100 mg Oral BID  . ferrous sulfate  325 mg Oral TID PC  . lisinopril  5 mg Oral BID  . metoprolol succinate  50 mg Oral BID  . simvastatin  5 mg Oral Daily   . sodium chloride 50 mL/hr at 04/17/14 1275    Physical Exam: Blood pressure 141/68, pulse 48, temperature 99.1 F (37.3 C), temperature source Oral, resp. rate 18, height 5' (1.524 m), weight 98 lb 12.3 oz (44.8 kg), SpO2 94 %.   Affect appropriate Healthy:  appears stated age 67: normal Neck supple with no adenopathy JVP normal no bruits no thyromegaly Lungs clear with no wheezing and good diaphragmatic motion Heart:  S1/S2 SEM murmur, no rub, gallop or click PMI normal Abdomen: benighn, BS positve, no tenderness, no AAA no bruit.  No HSM or HJR Distal pulses intact with no bruits No edema Neuro non-focal Skin warm and dry Left hip fracture    Labs:   Lab Results  Component Value Date   WBC 14.0* 04/17/2014   HGB 11.1* 04/17/2014   HCT 35.4* 04/17/2014   MCV 82.5 04/17/2014   PLT 182 04/17/2014    Recent Labs Lab 04/17/14 0455  NA 135  K 3.6  CL 102  CO2 24  BUN 11  CREATININE 0.61  CALCIUM 8.6  GLUCOSE 130*   Lab Results  Component Value Date   CKTOTAL 77 10/25/2010   CKMB 2.7 10/25/2010   TROPONINI <0.03 12/29/2013    Lab Results  Component Value Date   CHOL 172 11/16/2013   Lab Results  Component Value Date   HDL 53.00 11/16/2013   Lab Results  Component Value Date   LDLCALC 102* 11/16/2013   Lab Results  Component Value Date   TRIG 83.0 11/16/2013   Lab Results  Component Value Date   CHOLHDL 3 11/16/2013   No results found for: LDLDIRECT    Radiology: Dg Chest 1 View  04/16/2014   CLINICAL DATA:  Preop hip fracture. History of CABG and hypertension.  EXAM: CHEST  1 VIEW  COMPARISON:  12/29/2013  FINDINGS: Prior  CABG. Large hiatal hernia. Heart is borderline in size. Diffuse interstitial prominence throughout the lungs, increased since prior study. No effusions or acute bony abnormality.  IMPRESSION: Increasing diffuse interstitial prominence throughout the lungs which could reflect chronic interstitial lung disease. It is difficult to completely exclude superimposed edema.   Electronically  Signed   By: Rolm Baptise M.D.   On: 04/16/2014 19:54   Dg Hip Unilat  With Pelvis 2-3 Views Right  04/16/2014   CLINICAL DATA:  Hip pain.  Fall onto right hip.  EXAM: RIGHT HIP (WITH PELVIS) 2-3 VIEWS  COMPARISON:  None.  FINDINGS: There is a mildly impacted femoral neck fracture on the right. No subluxation or dislocation. Diffuse osteopenia. Left hip joint and SI joints are unremarkable.  IMPRESSION: Mildly impacted right femoral neck fracture.   Electronically Signed   By: Rolm Baptise M.D.   On: 04/16/2014 18:21    EKG:  04/16/14  SR rate 90 PAC;s no acute ST changes    ASSESSMENT AND PLAN:  Preop:  Clear to have orthopedic surgery.  Biggest risk to her is her age.  She is also at risk for PAF and atrial arrhythmias with ambiant PAC/PVC;s  MVR and no MAZE  Will increase beta blocker and start iv amiodarone to try to prevent.  Low risk for MI with fairly recent CABG.  Will order echo as her described morphology suggests risk of LVOT gradient with dehydration and her SEM does not correlate with post op echo 2014 with no residual MR  Would follow on telemetry post op  Discussed with husband of over 60 years who is very attached/involved and upset that he has not seen ortho yet  Signed: Jenkins Rouge 04/17/2014, 12:14 PM

## 2014-04-17 NOTE — Progress Notes (Signed)
INITIAL NUTRITION ASSESSMENT  DOCUMENTATION CODES Per approved criteria  -Severe malnutrition in the context of chronic illness  Pt meets criteria for severe MALNUTRITION in the context of chronic illness as evidenced by severe fat and muscle depletion.  INTERVENTION: - Once diet upgraded, add Ensure Enlive po BID, each supplement provides 350 kcal and 20 grams of protein - RD will continue to monitor for diet advancement  NUTRITION DIAGNOSIS: Inadequate oral intake related to inability to eat as evidenced by NPO.   Goal: Pt to meet >/= 90% of their estimated nutrition needs   Monitor:  Weight trend, diet advancement, labs  Reason for Assessment: Consult for hip fracture  81 y.o. female  Admitting Dx: Fracture of femoral neck, right  ASSESSMENT: 81 y.o. female presents with a hip fracture. She was at a blood drive and tripped over a chair leg while getting some water for a donor. She states that she fell on her right side and felt pain. She was unable to ambulate afterwards. Patient was found to have a femoral neck fracture here in the ED.  - Spoke with pt and husband.  - Pt drinks Ensure supplements at home twice daily per her doctor's recommendations. She said that she has been previously diagnosed with malnutrition. Pt with no recent weight loss. Currently NPO for surgery. She reports that she has been eating well lately.  - Per chart history, weight is stable.  - Labs and medications reviewed.  Nutrition Focused Physical Exam:  Subcutaneous Fat:  Orbital Region: moderate to severe depletion Upper Arm Region: severe depletion Thoracic and Lumbar Region: n/a  Muscle:  Temple Region: severe depletion Clavicle Bone Region: severe depletion Clavicle and Acromion Bone Region: severe depletion Scapular Bone Region: n/a Dorsal Hand: moderate to severe depletion Patellar Region: mild depletion Anterior Thigh Region: mild depletion Posterior Calf Region: mild  depletion  Edema: none  Height: Ht Readings from Last 1 Encounters:  04/16/14 5' (1.524 m)    Weight: Wt Readings from Last 1 Encounters:  04/16/14 98 lb 12.3 oz (44.8 kg)    Ideal Body Weight: 45.5 kg  % Ideal Body Weight: 98%  Wt Readings from Last 10 Encounters:  04/16/14 98 lb 12.3 oz (44.8 kg)  01/09/14 96 lb (43.545 kg)  12/29/13 94 lb 9.2 oz (42.9 kg)  11/16/13 102 lb (46.267 kg)  10/25/12 98 lb (44.453 kg)  05/08/12 99 lb (44.906 kg)  01/17/12 95 lb (43.092 kg)  11/25/11 95 lb 14.4 oz (43.5 kg)  11/15/11 95 lb (43.092 kg)  10/19/11 89 lb 1.1 oz (40.4 kg)    Usual Body Weight: 96-98 lbs  % Usual Body Weight: 100%  BMI:  Body mass index is 19.29 kg/(m^2).  Estimated Nutritional Needs: Kcal: 1350-1550 Protein: 75-85 g Fluid: 1.6 L/day  Skin: intact  Diet Order: Diet NPO time specified  EDUCATION NEEDS: -Education needs addressed   Intake/Output Summary (Last 24 hours) at 04/17/14 1415 Last data filed at 04/17/14 1334  Gross per 24 hour  Intake      0 ml  Output    350 ml  Net   -350 ml    Last BM: prior to admission   Labs:   Recent Labs Lab 04/16/14 1920 04/17/14 0455  NA 135 135  K 3.5 3.6  CL 102 102  CO2 25 24  BUN 9 11  CREATININE 0.61 0.61  CALCIUM 8.6 8.6  GLUCOSE 108* 130*    CBG (last 3)  No results for input(s): GLUCAP  in the last 72 hours.  Scheduled Meds: . docusate sodium  100 mg Oral BID  . ferrous sulfate  325 mg Oral TID PC  . lisinopril  5 mg Oral BID  . metoprolol succinate  50 mg Oral BID  . simvastatin  5 mg Oral Daily    Continuous Infusions: . sodium chloride 50 mL/hr at 04/17/14 9233    Past Medical History  Diagnosis Date  . Hypertension   . MVP (mitral valve prolapse)   . Osteoporosis   . Inguinal hernia right  . Mitral regurgitation due to cusp prolapse   . Cardiac tumor, ventricular     LVOT  . PVC (premature ventricular contraction)   . Hiatal hernia   . Coronary artery disease  10/04/2011  . Congestive heart failure 10/04/2011  . Heart murmur   . Shortness of breath   . Anginal pain   . Hernia, hiatal     Left wears a hernia belt  . Skin cancer 2013    Face.  . S/P mitral valve repair 10/12/2011    Complex valvuloplasty including triangular resection of flail posterior leaflet with artificial Goretex neocord placement x2 and 26 mm Sorin Memo 3D ring annuloplasty  . S/P CABG x 3 10/12/2011    LIMA to LAD, SVG to OM, SVG to RCA, EVH via left thigh and leg  . Thoracic compression fracture   . Hx of CABG     -LIMA to LAD,SVG to RCA, SVG to OM, mitral valve repair, patent foramen ovale closure, benign-apearing mass removal.  . Aortic atherosclerosis 10/25/2012    Upper aortic atherosclerosis seen on CT scan 08/13/2002  . Mild malnutrition 10/25/2012    Past Surgical History  Procedure Laterality Date  . Abdominal hysterectomy    . Leg fx repair      Right  . Hemorroidectomy    . Tee without cardioversion  11/13/2010    Procedure: TRANSESOPHAGEAL ECHOCARDIOGRAM (TEE);  Surgeon: Candee Furbish, MD;  Location: [facility] ENDOSCOPY;  Service: Cardiovascular;  Laterality: N/A;  . Skin cancer excision  2013    not melanoma  . Hemorrhoid surgery    . Mitral valve repair  10/12/2011    Procedure: MITRAL VALVE REPAIR (MVR);  Surgeon: Rexene Alberts, MD;  Location: [place];  Service: Open Heart Surgery;  Laterality: N/A;  MITRAL VALVE REPAIR  . Coronary artery bypass graft  10/12/2011    Procedure: CORONARY ARTERY BYPASS GRAFTING (CABG);  Surgeon: Rexene Alberts, MD;  Location: [place];  Service: Open Heart Surgery;  Laterality: N/A;  RESECTION OF LV MASS  . Patent foramen ovale closure  10/12/2011    Procedure: PATENT FORAMEN OVALE CLOSURE;  Surgeon: Rexene Alberts, MD;  Location: [place];  Service: Open Heart Surgery;  Laterality: N/A;  . Left and right heart catheterization with coronary angiogram N/A 09/29/2011    Procedure: LEFT AND RIGHT HEART CATHETERIZATION WITH CORONARY ANGIOGRAM;   Surgeon: Candee Furbish, MD;  Location: [facility] CATH LAB;  Service: Cardiovascular;  Laterality: N/A;    Laurette Schimke [place], [place], [place]

## 2014-04-17 NOTE — H&P (View-Only) (Signed)
Reason for Consult:  Right hip fracture Referring Physician:   ED Physician  Victoria Sexton is an 81 y.o. female.  HPI:   Patient presented to the ER with a right hip fracture. She was at a blood drive at her church and tripped over a chair leg while getting some water for a donor. She states that she fell on her right side and felt pain. She was unable to ambulate afterwards. Patient was found to have a femoral neck fracture here in the ED. She does have a history of CHF CAD and also has a history of HTN. Patient has no chest pain no shortness of breath. She has no current chest pain. She did not lose consciousness after the fall. She has no nausea or vomiting. Her husband states that she has been treated for iron deficiency in the past.    Past Medical History  Diagnosis Date  . Hypertension   . MVP (mitral valve prolapse)   . Osteoporosis   . Inguinal hernia right  . Mitral regurgitation due to cusp prolapse   . Cardiac tumor, ventricular     LVOT  . PVC (premature ventricular contraction)   . Hiatal hernia   . Coronary artery disease 10/04/2011  . Congestive heart failure 10/04/2011  . Heart murmur   . Shortness of breath   . Anginal pain   . Hernia, hiatal     Left wears a hernia belt  . Skin cancer 2013    Face.  . S/P mitral valve repair 10/12/2011    Complex valvuloplasty including triangular resection of flail posterior leaflet with artificial Goretex neocord placement x2 and 26 mm Sorin Memo 3D ring annuloplasty  . S/P CABG x 3 10/12/2011    LIMA to LAD, SVG to OM, SVG to RCA, EVH via left thigh and leg  . Thoracic compression fracture   . Hx of CABG     -LIMA to LAD,SVG to RCA, SVG to OM, mitral valve repair, patent foramen ovale closure, benign-apearing mass removal.  . Aortic atherosclerosis 10/25/2012    Upper aortic atherosclerosis seen on CT scan 08/13/2002  . Mild malnutrition 10/25/2012    Past Surgical History  Procedure Laterality Date  . Abdominal hysterectomy     . Leg fx repair      Right  . Hemorroidectomy    . Tee without cardioversion  11/13/2010    Procedure: TRANSESOPHAGEAL ECHOCARDIOGRAM (TEE);  Surgeon: Candee Furbish, MD;  Location: [facility] ENDOSCOPY;  Service: Cardiovascular;  Laterality: N/A;  . Skin cancer excision  2013    not melanoma  . Hemorrhoid surgery    . Mitral valve repair  10/12/2011    Procedure: MITRAL VALVE REPAIR (MVR);  Surgeon: Rexene Alberts, MD;  Location: [place];  Service: Open Heart Surgery;  Laterality: N/A;  MITRAL VALVE REPAIR  . Coronary artery bypass graft  10/12/2011    Procedure: CORONARY ARTERY BYPASS GRAFTING (CABG);  Surgeon: Rexene Alberts, MD;  Location: [place];  Service: Open Heart Surgery;  Laterality: N/A;  RESECTION OF LV MASS  . Patent foramen ovale closure  10/12/2011    Procedure: PATENT FORAMEN OVALE CLOSURE;  Surgeon: Rexene Alberts, MD;  Location: [place];  Service: Open Heart Surgery;  Laterality: N/A;  . Left and right heart catheterization with coronary angiogram N/A 09/29/2011    Procedure: LEFT AND RIGHT HEART CATHETERIZATION WITH CORONARY ANGIOGRAM;  Surgeon: Candee Furbish, MD;  Location: [facility] CATH LAB;  Service: Cardiovascular;  Laterality: N/A;  Family History  Problem Relation Age of Onset  . Heart attack Mother   . Lung cancer Father     Social History:  reports that she quit smoking about 3 years ago. Her smoking use included Cigarettes. She has a 15 pack-year smoking history. She has never used smokeless tobacco. She reports that she does not drink alcohol or use illicit drugs.  Allergies:  Allergies  Allergen Reactions  . Hydrocodone-Acetaminophen Anxiety    Pt nervous and shaky per pt husband  . Penicillins Palpitations  . Cyclobenzaprine Other (See Comments)    Dry mouth  . Sulfa Antibiotics Rash     Results for orders placed or performed during the hospital encounter of 04/16/14 (from the past 48 hour(s))  Basic metabolic panel     Status: Abnormal   Collection Time: 04/16/14   7:20 PM  Result Value Ref Range   Sodium 135 135 - 145 mmol/L   Potassium 3.5 3.5 - 5.1 mmol/L   Chloride 102 96 - 112 mmol/L   CO2 25 19 - 32 mmol/L   Glucose, Bld 108 (H) 70 - 99 mg/dL   BUN 9 6 - 23 mg/dL   Creatinine, Ser 0.61 0.50 - 1.10 mg/dL   Calcium 8.6 8.4 - 10.5 mg/dL   GFR calc non Af Amer 81 (L) >90 mL/min   GFR calc Af Amer >90 >90 mL/min    Comment: (NOTE) The eGFR has been calculated using the CKD EPI equation. This calculation has not been validated in all clinical situations. eGFR's persistently <90 mL/min signify possible Chronic Kidney Disease.    Anion gap 8 5 - 15  CBC WITH DIFFERENTIAL     Status: Abnormal   Collection Time: 04/16/14  7:20 PM  Result Value Ref Range   WBC 11.0 (H) 4.0 - 10.5 K/uL   RBC 4.51 3.87 - 5.11 MIL/uL   Hemoglobin 11.5 (L) 12.0 - 15.0 g/dL   HCT 37.5 36.0 - 46.0 %   MCV 83.1 78.0 - 100.0 fL   MCH 25.5 (L) 26.0 - 34.0 pg   MCHC 30.7 30.0 - 36.0 g/dL   RDW 16.9 (H) 11.5 - 15.5 %   Platelets 168 150 - 400 K/uL   Neutrophils Relative % 71 43 - 77 %   Neutro Abs 7.8 (H) 1.7 - 7.7 K/uL   Lymphocytes Relative 26 12 - 46 %   Lymphs Abs 2.8 0.7 - 4.0 K/uL   Monocytes Relative 3 3 - 12 %   Monocytes Absolute 0.4 0.1 - 1.0 K/uL   Eosinophils Relative 0 0 - 5 %   Eosinophils Absolute 0.0 0.0 - 0.7 K/uL   Basophils Relative 0 0 - 1 %   Basophils Absolute 0.0 0.0 - 0.1 K/uL  Protime-INR     Status: None   Collection Time: 04/16/14  7:20 PM  Result Value Ref Range   Prothrombin Time 13.3 11.6 - 15.2 seconds   INR 1.00 0.00 - 1.49  Type and screen     Status: None   Collection Time: 04/16/14  7:21 PM  Result Value Ref Range   ABO/RH(D) O NEG    Antibody Screen NEG    Sample Expiration 04/19/2014   ABO/Rh     Status: None   Collection Time: 04/16/14  7:21 PM  Result Value Ref Range   ABO/RH(D) O NEG   CBC     Status: Abnormal   Collection Time: 04/17/14  4:55 AM  Result Value Ref Range  WBC 14.0 (H) 4.0 - 10.5 K/uL   RBC 4.29  3.87 - 5.11 MIL/uL   Hemoglobin 11.1 (L) 12.0 - 15.0 g/dL   HCT 35.4 (L) 36.0 - 46.0 %   MCV 82.5 78.0 - 100.0 fL   MCH 25.9 (L) 26.0 - 34.0 pg   MCHC 31.4 30.0 - 36.0 g/dL   RDW 17.1 (H) 11.5 - 15.5 %   Platelets 182 150 - 400 K/uL  Basic metabolic panel     Status: Abnormal   Collection Time: 04/17/14  4:55 AM  Result Value Ref Range   Sodium 135 135 - 145 mmol/L   Potassium 3.6 3.5 - 5.1 mmol/L   Chloride 102 96 - 112 mmol/L   CO2 24 19 - 32 mmol/L   Glucose, Bld 130 (H) 70 - 99 mg/dL   BUN 11 6 - 23 mg/dL   Creatinine, Ser 0.61 0.50 - 1.10 mg/dL   Calcium 8.6 8.4 - 10.5 mg/dL   GFR calc non Af Amer 81 (L) >90 mL/min   GFR calc Af Amer >90 >90 mL/min    Comment: (NOTE) The eGFR has been calculated using the CKD EPI equation. This calculation has not been validated in all clinical situations. eGFR's persistently <90 mL/min signify possible Chronic Kidney Disease.    Anion gap 9 5 - 15  Urinalysis, Routine w reflex microscopic     Status: Abnormal   Collection Time: 04/17/14  8:52 AM  Result Value Ref Range   Color, Urine YELLOW YELLOW   APPearance CLEAR CLEAR   Specific Gravity, Urine 1.020 1.005 - 1.030   pH 6.0 5.0 - 8.0   Glucose, UA NEGATIVE NEGATIVE mg/dL   Hgb urine dipstick SMALL (A) NEGATIVE   Bilirubin Urine NEGATIVE NEGATIVE   Ketones, ur 40 (A) NEGATIVE mg/dL   Protein, ur NEGATIVE NEGATIVE mg/dL   Urobilinogen, UA 0.2 0.0 - 1.0 mg/dL   Nitrite NEGATIVE NEGATIVE   Leukocytes, UA NEGATIVE NEGATIVE  Urine microscopic-add on     Status: None   Collection Time: 04/17/14  8:52 AM  Result Value Ref Range   RBC / HPF 3-6 <3 RBC/hpf   Urine-Other MUCOUS PRESENT     Dg Chest 1 View  04/16/2014   CLINICAL DATA:  Preop hip fracture. History of CABG and hypertension.  EXAM: CHEST  1 VIEW  COMPARISON:  12/29/2013  FINDINGS: Prior CABG. Large hiatal hernia. Heart is borderline in size. Diffuse interstitial prominence throughout the lungs, increased since prior study.  No effusions or acute bony abnormality.  IMPRESSION: Increasing diffuse interstitial prominence throughout the lungs which could reflect chronic interstitial lung disease. It is difficult to completely exclude superimposed edema.   Electronically Signed   By: Rolm Baptise M.D.   On: 04/16/2014 19:54   Dg Hip Unilat  With Pelvis 2-3 Views Right  04/16/2014   CLINICAL DATA:  Hip pain.  Fall onto right hip.  EXAM: RIGHT HIP (WITH PELVIS) 2-3 VIEWS  COMPARISON:  None.  FINDINGS: There is a mildly impacted femoral neck fracture on the right. No subluxation or dislocation. Diffuse osteopenia. Left hip joint and SI joints are unremarkable.  IMPRESSION: Mildly impacted right femoral neck fracture.   Electronically Signed   By: Rolm Baptise M.D.   On: 04/16/2014 18:21    Review of Systems  Constitutional: Negative.   HENT: Negative.   Eyes: Negative.   Respiratory: Positive for shortness of breath (on exertion).   Cardiovascular: Negative.   Gastrointestinal: Negative.  Genitourinary: Negative.   Musculoskeletal: Positive for joint pain.  Skin: Negative.   Neurological: Negative.   Endo/Heme/Allergies: Negative.   Psychiatric/Behavioral: Negative.    Blood pressure 141/68, pulse 48, temperature 99.1 F (37.3 C), temperature source Oral, resp. rate 18, height 5' (1.524 m), weight 44.8 kg (98 lb 12.3 oz), SpO2 94 %. Physical Exam  Constitutional: She appears well-developed and well-nourished.  HENT:  Head: Normocephalic.  Eyes: Pupils are equal, round, and reactive to light.  Neck: Neck supple. No JVD present. No tracheal deviation present. No thyromegaly present.  Cardiovascular: Normal rate, regular rhythm and intact distal pulses.   Murmur heard. Respiratory: Effort normal. No stridor. No respiratory distress. She has no wheezes.  GI: Soft. There is no tenderness. There is no guarding.  Musculoskeletal:       Right hip: She exhibits decreased range of motion, decreased strength, tenderness  and bony tenderness.  Lymphadenopathy:    She has no cervical adenopathy.  Neurological: She is alert.  Skin: Skin is warm and dry.  Psychiatric: She has a normal mood and affect.    Assessment/Plan: Right hip fracture   We have discussed with the patient and patient's husband about the need for surgery.  At this time Dr. Alvan Dame would like to proceed with a right hip hemiarthroplasty.  We will wait to see if the patient is cleared from both a medical and cardiology standpoint. Will be NPO now and will obtain consent for the surgery.     Victoria Sexton 04/17/2014, 9:49 AM

## 2014-04-17 NOTE — Op Note (Signed)
NAME:  Victoria Sexton, Victoria Sexton                ACCOUNT NO.:  [id number]   MEDICAL RECORD NO.: [id number]   LOCATION:  9798                         FACILITY:  [facility] OF BIRTH:  06/13/1927  PHYSICIAN:  Pietro Cassis. Alvan Dame, M.D.     DATE OF PROCEDURE:  04/17/14                               OPERATIVE REPORT     PREOPERATIVE DIAGNOSIS:  Right displaced femoral neck fracture.   POSTOPERATIVE DIAGNOSIS:  Right displaced femoral neck fracture.   PROCEDURE:  Right hip hemiarthroplasty utilizing DePuy component, size 4 standard Summit [employer] stem with a 42mm unipolar ball with a +5 adapter.   SURGEON:  Pietro Cassis. Alvan Dame, MD   ASSISTANT:  None.   ANESTHESIA:  Spinal.   SPECIMENS:  None.   DRAINS:  None.   BLOOD LOSS:  About 100 cc.   COMPLICATIONS:  None.   INDICATION OF PROCEDURE:  Victoria Sexton is a pleasant 81 year-old female who lives Independently with her husband.  She unfortunately had a fall while at a blood drive that she and her husband were hosting.  She was admitted to the hospital after radiographs in the ER revealed a femoral neck fracture.  She was seen and evaluated and was scheduled for surgery for Fixation once cleared medically related to cardiac and general medical conditions.  The necessity of surgical repair was discussed with she and her family.  Consent was obtained after reviewing risks of infection, DVT, component failure, and need for revision surgery.   PROCEDURE IN DETAIL:  The patient was brought to the operative theater. Once adequate anesthesia, preoperative antibiotics, 2 g of Ancef administered, the patient was positioned into the left lateral decubitus position with the right side up.  The right lower extremity was then prepped and draped in sterile fashion.  A time-out was performed identifying the patient, planned procedure, and extremity.   A lateral incision was made off the proximal trochanter. Sharp dissection was carried down to the iliotibial band  and gluteal fascia. The gluteal fascia was then incised for posterior approach.  The short external rotators were taken down separate from the posterior capsule. An L capsulotomy was made preserving the posterior leaflet for later anatomic repair. Fracture site was identified and after removing comminuted segments of the posterior femoral neck, the femoral head was removed without difficulty and measured on the back table  using the sizing rings and determined to be 44 mm in diameter.   The proximal femur was then exposed.  Retractors placed.  I then drilled, opened the proximal femur.  Then I hand reamed once and  Irrigated the canal to try to prevent fat emboli.  I began broaching the femur with a starting size 1 broach up to a size 4 broach with good medial and lateral metaphyseal fit without evidence of any torsion or movement.  A trial reduction was carried out with a standard neck and a +0 then +5 adapter with a 51mm ball.  The hip reduced nicely.  The leg lengths appeared to be equal compared to the down leg.   The hip went through a range of motion without evidence of any subluxation or  impingement.   Given these findings, the trial components removed.  The final size 4  Press fit Summit basic stem was opened.  After irrigating the canal, the final stem was impacted and sat at the level where the broach was. Based on this and the trial reduction, a +5 adapter was opened and impacted in the 33mm unipolar ball onto a clean and dry trunnion.  The hip had been irrigated throughout the case and again at this point.  I re- Approximated the posterior capsule to the superior leaflet using a  #1 Vicryl.  The remainder of the wound was closed with #1 Vicryl and #0 V-lock suture in the iliotibial band and gluteal fascia, a  2-0 Vicryl in the sub-Q tissue and a running 4-0 Monocryl in the skin.  The hip was cleaned, dried, and dressed sterilely using Dermabond and Aquacel dressing.  She was  then brought to recovery room, extubated in stable condition, tolerating the procedure well.       Pietro Cassis Alvan Dame, M.D.

## 2014-04-17 NOTE — Transfer of Care (Signed)
Immediate Anesthesia Transfer of Care Note  Patient: Victoria Sexton  Procedure(s) Performed: Procedure(s): ARTHROPLASTY BIPOLAR HIP (Right)  Patient Location: PACU  Anesthesia Type:Spinal  Level of Consciousness: awake, alert  and oriented  Airway & Oxygen Therapy: Patient Spontanous Breathing and Patient connected to nasal cannula oxygen  Post-op Assessment: Report given to RN and Post -op Vital signs reviewed and stable  Post vital signs: Reviewed and stable  Last Vitals:  Filed Vitals:   04/17/14 1755  BP: 156/70  Pulse: 87  Temp:   Resp:     Complications: No apparent anesthesia complications

## 2014-04-17 NOTE — Anesthesia Procedure Notes (Signed)
Spinal Patient location during procedure: OR Start time: 04/17/2014 8:10 PM End time: 04/17/2014 8:12 PM Staffing Anesthesiologist: Kate Sable Performed by: anesthesiologist  Preanesthetic Checklist Completed: patient identified, site marked, surgical consent, pre-op evaluation, timeout performed, IV checked, risks and benefits discussed and monitors and equipment checked Spinal Block Patient position: right lateral decubitus Prep: Betadine Patient monitoring: heart rate, cardiac monitor, continuous pulse ox and blood pressure Approach: left paramedian Location: L3-4 Injection technique: single-shot Needle Needle type: Quincke  Needle gauge: 22 G Needle length: 9 cm Needle insertion depth: 4 cm Assessment Sensory level: T10 Additional Notes 9mg  Marcaine spinal w/o difficulty. Pt tolerated well. GES

## 2014-04-18 ENCOUNTER — Encounter (HOSPITAL_COMMUNITY): Payer: Self-pay | Admitting: Orthopedic Surgery

## 2014-04-18 DIAGNOSIS — I2510 Atherosclerotic heart disease of native coronary artery without angina pectoris: Secondary | ICD-10-CM

## 2014-04-18 DIAGNOSIS — Z9889 Other specified postprocedural states: Secondary | ICD-10-CM

## 2014-04-18 LAB — BASIC METABOLIC PANEL
Anion gap: 7 (ref 5–15)
BUN: 11 mg/dL (ref 6–23)
CALCIUM: 8.2 mg/dL — AB (ref 8.4–10.5)
CO2: 23 mmol/L (ref 19–32)
Chloride: 105 mmol/L (ref 96–112)
Creatinine, Ser: 0.51 mg/dL (ref 0.50–1.10)
GFR calc Af Amer: >90 mL/min (ref >90)
GFR calc non Af Amer: 86 mL/min — ABNORMAL LOW (ref >90)
GLUCOSE: 153 mg/dL — AB (ref 70–99)
Potassium: 3.3 mmol/L — ABNORMAL LOW (ref 3.5–5.1)
Sodium: 135 mmol/L (ref 135–145)

## 2014-04-18 LAB — URINE CULTURE
Colony Count: NO GROWTH
Culture: NO GROWTH
Special Requests: NORMAL

## 2014-04-18 LAB — CBC
HCT: 31 % — ABNORMAL LOW (ref 36.0–46.0)
Hemoglobin: 10 g/dL — ABNORMAL LOW (ref 12.0–15.0)
MCH: 26.5 pg (ref 26.0–34.0)
MCHC: 32.3 g/dL (ref 30.0–36.0)
MCV: 82 fL (ref 78.0–100.0)
PLATELETS: 122 10*3/uL — AB (ref 150–400)
RBC: 3.78 MIL/uL — AB (ref 3.87–5.11)
RDW: 16.9 % — AB (ref 11.5–15.5)
WBC: 8.4 10*3/uL (ref 4.0–10.5)

## 2014-04-18 MED ORDER — FENTANYL CITRATE (PF) 100 MCG/2ML IJ SOLN: 50.0000 ug | INTRAMUSCULAR | Status: DC | PRN

## 2014-04-18 MED ORDER — POTASSIUM CHLORIDE CRYS ER 20 MEQ PO TBCR
40.0000 meq | EXTENDED_RELEASE_TABLET | ORAL | Status: AC
  Administered 2014-04-18 (×2): 40 meq via ORAL
  Filled 2014-04-18 (×2): qty 2

## 2014-04-18 NOTE — Progress Notes (Signed)
OT Cancellation Note  Patient Details Name: Victoria Sexton MRN: [id number] DOB: 1927-02-17   Cancelled Treatment:    Reason Eval/Treat Not Completed: Other (comment).  Spoke to PT:  Pt moving slowly with them and nauseous at end of session.  Will check back tomorrow.  Harol Shabazz 04/18/2014, 11:40 AM  Lesle Chris, OTR/L [phone] 04/18/2014

## 2014-04-18 NOTE — Progress Notes (Signed)
Patient Demographics  Victoria Sexton, is a 81 y.o. female, DOB - [birth year], PJK:[id number]  Admit date - 04/16/2014   Admitting Physician Allyne Gee, MD  Outpatient Primary MD for the patient is Vidal Schwalbe, MD  LOS - 2   Chief Complaint  Patient presents with  . Fall  . Hip Pain      Admission history of present illness/brief narrative:  HPI: Victoria Sexton is a 81 y.o. female presents with a hip fracture. She was at a blood drive and tripped over a chair leg while getting some water for a donor. She states that she fell on her right side and felt pain. She was unable to ambulate afterwards. Patient was found to have a femoral neck fracture here in the ED. She does have a history of CHF CAD and also has a history of HTN. Patient has no chest pain no shortness of breath. She has no current chest pain. She did not lose consciousness after the fall. She has no nausea or vomiting. Her husband states that she has been treated for iron deficiency in the past.  Patient had right hip hemiarthroplasty done by Dr.Olin 04/17/14.  Subjective:   Jolly Mango today has, No headache, No chest pain, No abdominal pain - No Nausea, No new weakness tingling or numbness, No Cough - SOB. Reports right hip pain much better, only significant on movement.  Assessment & Plan    Principal Problem:   Fracture of femoral neck, right Active Problems:   Essential hypertension, benign   Coronary artery disease   Congestive heart failure   Femoral neck fracture   Preop cardiovascular exam   H/O mitral valve repair  Right hip fracture - Status post right hip hemiarthroplasty 04/17/14 by Dr.Olin. - PT to follow.  Hypertension - Acceptable on home medication lisinopril and metoprolol  Hyperlipidemia - Continue with statin  Chronic diastolic CHF - Pierce to be compensated, monitor  closely.  History of MVR - No atrial arrhythmias postoperatively on beta blockers and IV amiodarone, continue with telemetry, if no further A. fib will discontinue amiodarone tomorrow as per cardiology recommendation. - 2-D echo pending.  History of coronary artery disease - Denies any chest pain or shortness of breath, continue aspirin, beta blockers and statin.  Hypokalemia - Repleted, recheck in a.m..   Code Status: full  Family Communication: None at bedside  Disposition Plan: Pending PT evaluation after surgery   Procedures  None   Consults   Orthopedic   cardiology   Medications  Scheduled Meds: . aspirin EC  325 mg Oral BID  . docusate sodium  100 mg Oral BID  . ferrous sulfate  325 mg Oral TID PC  . lisinopril  5 mg Oral BID  . metoprolol tartrate  25 mg Oral BID  . simvastatin  5 mg Oral Daily   Continuous Infusions: . sodium chloride 50 mL/hr at 04/17/14 0838  . amiodarone 30 mg/hr (04/17/14 2327)  . sodium chloride 0.9 % 1,000 mL with potassium chloride 10 mEq infusion 50 mL/hr at 04/17/14 2324   PRN Meds:.alum & mag hydroxide-simeth, bisacodyl, fentaNYL, HYDROmorphone (DILAUDID) injection, HYDROmorphone (DILAUDID) injection, menthol-cetylpyridinium **OR** phenol, methocarbamol **OR** methocarbamol (ROBAXIN)  IV, metoCLOPramide **OR** metoCLOPramide (REGLAN) injection, ondansetron **  OR** ondansetron (ZOFRAN) IV, oxyCODONE, polyethylene glycol, traMADol  DVT Prophylaxis   SCDs, aspirin 325 mg oral twice a day  Lab Results  Component Value Date   PLT 122* 04/18/2014    Antibiotics    Anti-infectives    Start     Dose/Rate Route Frequency Ordered Stop   04/18/14 0300  ceFAZolin (ANCEF) IVPB 1 g/50 mL premix     1 g 100 mL/hr over 30 Minutes Intravenous Every 6 hours 04/17/14 2259 04/18/14 0908   04/17/14 2045  ceFAZolin (ANCEF) IVPB 2 g/50 mL premix     2 g 100 mL/hr over 30 Minutes Intravenous  Once 04/17/14 2041 04/17/14 2050           Objective:   Filed Vitals:   04/17/14 2245 04/17/14 2252 04/18/14 0512 04/18/14 0739  BP: 145/61 157/67 131/80   Pulse: 88 89 87   Temp:  97.8 F (36.6 C) 98.4 F (36.9 C)   TempSrc:  Oral Oral   Resp: 16 16 20 18   Height:      Weight:      SpO2: 97% 99% 100% 100%    Wt Readings from Last 3 Encounters:  04/16/14 44.8 kg (98 lb 12.3 oz)  01/09/14 43.545 kg (96 lb)  12/29/13 42.9 kg (94 lb 9.2 oz)     Intake/Output Summary (Last 24 hours) at 04/18/14 1106 Last data filed at 04/18/14 0947  Gross per 24 hour  Intake   1245 ml  Output    800 ml  Net    445 ml     Physical Exam  Awake Alert, Oriented X 3, No new F.N deficits, Normal affect [place].AT,PERRAL Supple Neck,No JVD, No cervical lymphadenopathy appriciated.  Symmetrical Chest wall movement, Good air movement bilaterally, CTAB RRR,No Gallops,Rubs or new Murmurs, No Parasternal Heave +ve B.Sounds, Abd Soft, No tenderness, No organomegaly appriciated, No rebound - guarding or rigidity. No Cyanosis, Clubbing or edema, No new Rash or bruise  , good pulses bilaterally.   Data Review   Micro Results Recent Results (from the past 240 hour(s))  Surgical pcr screen     Status: None   Collection Time: 04/17/14  3:44 PM  Result Value Ref Range Status   MRSA, PCR NEGATIVE NEGATIVE Final   Staphylococcus aureus NEGATIVE NEGATIVE Final    Comment:        The Xpert SA Assay (FDA approved for NASAL specimens in patients over 11 years of age), is one component of a comprehensive surveillance program.  Test performance has been validated by [facility] for patients greater than or equal to 66 year old. It is not intended to diagnose infection nor to guide or monitor treatment.     Radiology Reports Dg Chest 1 View  04/16/2014   CLINICAL DATA:  Preop hip fracture. History of CABG and hypertension.  EXAM: CHEST  1 VIEW  COMPARISON:  12/29/2013  FINDINGS: Prior CABG. Large hiatal hernia. Heart is borderline in size.  Diffuse interstitial prominence throughout the lungs, increased since prior study. No effusions or acute bony abnormality.  IMPRESSION: Increasing diffuse interstitial prominence throughout the lungs which could reflect chronic interstitial lung disease. It is difficult to completely exclude superimposed edema.   Electronically Signed   By: Rolm Baptise M.D.   On: 04/16/2014 19:54   Pelvis Portable  04/18/2014   CLINICAL DATA:  Status post right hip arthroplasty  EXAM: PORTABLE PELVIS 1-2 VIEWS  COMPARISON:  None.  FINDINGS: Located right hip hemiarthroplasty. The femoral  stem is incompletely imaged; no evidence of periprosthetic fracture.  Profound osteopenia.  The left hip is located and intact.  Expected subcutaneous gas around the right hip.  IMPRESSION: 1. New right hip hemiarthroplasty without adverse finding. 2. Incomplete visualization of the femoral stem. Will gladly obtain additional views if desired.   Electronically Signed   By: Monte Fantasia M.D.   On: 04/18/2014 02:24   Dg Hip Unilat  With Pelvis 2-3 Views Right  04/16/2014   CLINICAL DATA:  Hip pain.  Fall onto right hip.  EXAM: RIGHT HIP (WITH PELVIS) 2-3 VIEWS  COMPARISON:  None.  FINDINGS: There is a mildly impacted femoral neck fracture on the right. No subluxation or dislocation. Diffuse osteopenia. Left hip joint and SI joints are unremarkable.  IMPRESSION: Mildly impacted right femoral neck fracture.   Electronically Signed   By: Rolm Baptise M.D.   On: 04/16/2014 18:21    CBC  Recent Labs Lab 04/16/14 1920 04/17/14 0455 04/18/14 0439  WBC 11.0* 14.0* 8.4  HGB 11.5* 11.1* 10.0*  HCT 37.5 35.4* 31.0*  PLT 168 182 122*  MCV 83.1 82.5 82.0  MCH 25.5* 25.9* 26.5  MCHC 30.7 31.4 32.3  RDW 16.9* 17.1* 16.9*  LYMPHSABS 2.8  --   --   MONOABS 0.4  --   --   EOSABS 0.0  --   --   BASOSABS 0.0  --   --     Chemistries   Recent Labs Lab 04/16/14 1920 04/17/14 0455 04/18/14 0439  NA 135 135 135  K 3.5 3.6 3.3*  CL  102 102 105  CO2 25 24 23   GLUCOSE 108* 130* 153*  BUN 9 11 11   CREATININE 0.61 0.61 0.51  CALCIUM 8.6 8.6 8.2*   ------------------------------------------------------------------------------------------------------------------ estimated creatinine clearance is 37 mL/min (by C-G formula based on Cr of 0.51). ------------------------------------------------------------------------------------------------------------------ No results for input(s): HGBA1C in the last 72 hours. ------------------------------------------------------------------------------------------------------------------ No results for input(s): CHOL, HDL, LDLCALC, TRIG, CHOLHDL, LDLDIRECT in the last 72 hours. ------------------------------------------------------------------------------------------------------------------ No results for input(s): TSH, T4TOTAL, T3FREE, THYROIDAB in the last 72 hours.  Invalid input(s): FREET3 ------------------------------------------------------------------------------------------------------------------ No results for input(s): VITAMINB12, FOLATE, FERRITIN, TIBC, IRON, RETICCTPCT in the last 72 hours.  Coagulation profile  Recent Labs Lab 04/16/14 1920  INR 1.00    No results for input(s): DDIMER in the last 72 hours.  Cardiac Enzymes No results for input(s): CKMB, TROPONINI, MYOGLOBIN in the last 168 hours.  Invalid input(s): CK ------------------------------------------------------------------------------------------------------------------ Invalid input(s): POCBNP     Time Spent in minutes   35 minutes   Katrine Radich M.D on 04/18/2014 at 11:06 AM  Between 7am to 7pm - Pager - [phone]  After 7pm go to [email or web address] - password [email or web address]  And look for the night coverage person covering for me after hours  [facility] Office  [phone]   **Disclaimer: This note may have been dictated with voice recognition software. Similar sounding words  can inadvertently be transcribed and this note may contain transcription errors which may not have been corrected upon publication of note.**

## 2014-04-18 NOTE — Progress Notes (Signed)
Clinical Social Work  CSW met with pt and husband who is at bedside. Introduced self and explained role. CSW inquired about pt's thoughts of SNF for short term rehab, pt and husband stated that they will be going home with Home Health. CSW left list of bed offers for pt and husband in case they were to change their mind. Husband states that they will be going home per MD orders and will be fine with Home Health. CSW is signing off but if needed can be reached at [phone].  Glorious Peach BSW Intern

## 2014-04-18 NOTE — Evaluation (Signed)
Physical Therapy Evaluation Patient Details Name: Victoria Sexton MRN: [id number] DOB: 1927/11/30 Today's Date: 04/18/2014   History of Present Illness  Pt is an 81 year old female admitted after fall sustaining R femoral neck fx, s/p R hip hemiarthroplasty.  Clinical Impression  Patient is s/p above surgery resulting in functional limitations due to the deficits listed below (see PT Problem List).  Patient will benefit from skilled PT to increase their independence and safety with mobility to allow discharge to the venue listed below.  Pt with limited session due to dizziness and pain so only assist to recliner.  Pt plans on d/c home however recommended SNF at this time.     Follow Up Recommendations SNF;Supervision/Assistance - 24 hour    Equipment Recommendations  None recommended by PT    Recommendations for Other Services       Precautions / Restrictions Precautions Precautions: Fall;Posterior Hip Restrictions Weight Bearing Restrictions: No RLE Weight Bearing: Weight bearing as tolerated      Mobility  Bed Mobility Overal bed mobility: Needs Assistance;+2 for physical assistance Bed Mobility: Supine to Sit     Supine to sit: Max assist;+2 for physical assistance;HOB elevated     General bed mobility comments: verbal cues for technique, increased assist for weakness and R hip pain  Transfers Overall transfer level: Needs assistance Equipment used: Rolling walker (2 wheeled) Transfers: Sit to/from [facility] Sit to Stand: Mod assist;+2 physical assistance Stand pivot transfers: Mod assist;+2 physical assistance       General transfer comment: verbal cues for precautions and technique, pt with increased dizziness and profuse sweating so only assisted to recliner  BP: 136/56 mmHg, HR 81 bpm, SpO2 91% room air however reapplied O2 [place] once in reclincer  Ambulation/Gait                Stairs            Wheelchair Mobility    Modified  Rankin (Stroke Patients Only)       Balance Overall balance assessment: History of Falls                                           Pertinent Vitals/Pain Pain Assessment: 0-10 Pain Score: 10-Worst pain ever Pain Location: R hip Pain Descriptors / Indicators: Sore;Aching Pain Intervention(s): Limited activity within patient's tolerance;Monitored during session;Premedicated before session;Repositioned    Home Living Family/patient expects to be discharged to:: Private residence Living Arrangements: Spouse/significant other   Type of Home: House Home Access: Stairs to enter   Technical brewer of Steps: 2 Home Layout: Bed/bath upstairs;Two level Home Equipment: Walker - 2 wheels      Prior Function Level of Independence: Independent               Hand Dominance        Extremity/Trunk Assessment               Lower Extremity Assessment: RLE deficits/detail RLE Deficits / Details: decreased active movement, required assist for bed mobility       Communication   Communication: No difficulties  Cognition Arousal/Alertness: Awake/alert Behavior During Therapy: WFL for tasks assessed/performed Overall Cognitive Status: Within Functional Limits for tasks assessed                      General Comments      Exercises  Assessment/Plan    PT Assessment Patient needs continued PT services  PT Diagnosis Difficulty walking;Acute pain   PT Problem List Decreased strength;Decreased balance;Decreased mobility;Pain;Decreased knowledge of use of DME;Decreased knowledge of precautions;Decreased range of motion  PT Treatment Interventions DME instruction;Gait training;Stair training;Functional mobility training;Therapeutic activities;Therapeutic exercise;Patient/family education   PT Goals (Current goals can be found in the Care Plan section) Acute Rehab PT Goals PT Goal Formulation: With patient Time For Goal Achievement:  04/25/14 Potential to Achieve Goals: Good    Frequency Min 6X/week   Barriers to discharge        Co-evaluation               End of Session Equipment Utilized During Treatment: Gait belt Activity Tolerance: Patient limited by fatigue;Patient limited by pain Patient left: in chair;with call bell/phone within reach;with family/visitor present Nurse Communication: Mobility status;Precautions         Time: 0677-0340 PT Time Calculation (min) (ACUTE ONLY): 21 min   Charges:   PT Evaluation $Initial PT Evaluation Tier I: 1 Procedure     PT G Codes:        Jerene Yeager,KATHrine E 04/18/2014, 12:53 PM Carmelia Bake, PT, DPT 04/18/2014 Pager: [phone]

## 2014-04-18 NOTE — Progress Notes (Signed)
SUBJECTIVE:  Having a lot of leg pain  OBJECTIVE:   Vitals:   Filed Vitals:   04/17/14 2245 04/17/14 2252 04/18/14 0512 04/18/14 0739  BP: 145/61 157/67 131/80   Pulse: 88 89 87   Temp:  97.8 F (36.6 C) 98.4 F (36.9 C)   TempSrc:  Oral Oral   Resp: 16 16 20 18   Height:      Weight:      SpO2: 97% 99% 100% 100%   I&O's:   Intake/Output Summary (Last 24 hours) at 04/18/14 0802 Last data filed at 04/18/14 0253  Gross per 24 hour  Intake   1125 ml  Output    800 ml  Net    325 ml   TELEMETRY: Reviewed telemetry pt in NSR:     PHYSICAL EXAM General: Well developed, well nourished, in no acute distress Head: Eyes PERRLA, No xanthomas.   Normal cephalic and atramatic  Lungs:   Clear bilaterally to auscultation and percussion. Heart:   HRRR S1 S2 Pulses are 2+ & equal. Abdomen: Bowel sounds are positive, abdomen soft and non-tender without masses Extremities:   No clubbing, cyanosis or edema.  DP +1 Neuro: Alert and oriented X 3. Psych:  Good affect, responds appropriately   LABS: Basic Metabolic Panel:  Recent Labs  04/17/14 0455 04/18/14 0439  NA 135 135  K 3.6 3.3*  CL 102 105  CO2 24 23  GLUCOSE 130* 153*  BUN 11 11  CREATININE 0.61 0.51  CALCIUM 8.6 8.2*   Liver Function Tests: No results for input(s): AST, ALT, ALKPHOS, BILITOT, PROT, ALBUMIN in the last 72 hours. No results for input(s): LIPASE, AMYLASE in the last 72 hours. CBC:  Recent Labs  04/16/14 1920 04/17/14 0455 04/18/14 0439  WBC 11.0* 14.0* 8.4  NEUTROABS 7.8*  --   --   HGB 11.5* 11.1* 10.0*  HCT 37.5 35.4* 31.0*  MCV 83.1 82.5 82.0  PLT 168 182 122*   Cardiac Enzymes: No results for input(s): CKTOTAL, CKMB, CKMBINDEX, TROPONINI in the last 72 hours. BNP: Invalid input(s): POCBNP D-Dimer: No results for input(s): DDIMER in the last 72 hours. Hemoglobin A1C: No results for input(s): HGBA1C in the last 72 hours. Fasting Lipid Panel: No results for input(s): CHOL, HDL,  LDLCALC, TRIG, CHOLHDL, LDLDIRECT in the last 72 hours. Thyroid Function Tests: No results for input(s): TSH, T4TOTAL, T3FREE, THYROIDAB in the last 72 hours.  Invalid input(s): FREET3 Anemia Panel: No results for input(s): VITAMINB12, FOLATE, FERRITIN, TIBC, IRON, RETICCTPCT in the last 72 hours. Coag Panel:   Lab Results  Component Value Date   INR 1.00 04/16/2014   INR 2.21* 10/19/2011   INR 2.06* 10/18/2011    RADIOLOGY: Dg Chest 1 View  04/16/2014   CLINICAL DATA:  Preop hip fracture. History of CABG and hypertension.  EXAM: CHEST  1 VIEW  COMPARISON:  12/29/2013  FINDINGS: Prior CABG. Large hiatal hernia. Heart is borderline in size. Diffuse interstitial prominence throughout the lungs, increased since prior study. No effusions or acute bony abnormality.  IMPRESSION: Increasing diffuse interstitial prominence throughout the lungs which could reflect chronic interstitial lung disease. It is difficult to completely exclude superimposed edema.   Electronically Signed   By: Rolm Baptise M.D.   On: 04/16/2014 19:54   Pelvis Portable  04/18/2014   CLINICAL DATA:  Status post right hip arthroplasty  EXAM: PORTABLE PELVIS 1-2 VIEWS  COMPARISON:  None.  FINDINGS: Located right hip hemiarthroplasty. The femoral stem is incompletely  imaged; no evidence of periprosthetic fracture.  Profound osteopenia.  The left hip is located and intact.  Expected subcutaneous gas around the right hip.  IMPRESSION: 1. New right hip hemiarthroplasty without adverse finding. 2. Incomplete visualization of the femoral stem. Will gladly obtain additional views if desired.   Electronically Signed   By: Monte Fantasia M.D.   On: 04/18/2014 02:24   Dg Hip Unilat  With Pelvis 2-3 Views Right  04/16/2014   CLINICAL DATA:  Hip pain.  Fall onto right hip.  EXAM: RIGHT HIP (WITH PELVIS) 2-3 VIEWS  COMPARISON:  None.  FINDINGS: There is a mildly impacted femoral neck fracture on the right. No subluxation or dislocation. Diffuse  osteopenia. Left hip joint and SI joints are unremarkable.  IMPRESSION: Mildly impacted right femoral neck fracture.   Electronically Signed   By: Rolm Baptise M.D.   On: 04/16/2014 18:21   ASSESSMENT AND PLAN:  Femoral neck fracture s/p repair.     MVR and no MAZE- no atrial arrhythmias post op on BB and IV Amio.  Would follow on telemetry and if no further afib d/c Amio tomorrow.  Echo pending.   ASCAD s/p CABG with no angina.  Continue ASA/BB and statin.    Hypokalemia - replete per [facility]   Sueanne Margarita, MD  04/18/2014  8:02 AM

## 2014-04-19 LAB — CBC
HEMATOCRIT: 29.8 % — AB (ref 36.0–46.0)
HEMOGLOBIN: 9.4 g/dL — AB (ref 12.0–15.0)
MCH: 26.3 pg (ref 26.0–34.0)
MCHC: 31.5 g/dL (ref 30.0–36.0)
MCV: 83.2 fL (ref 78.0–100.0)
Platelets: 144 10*3/uL — ABNORMAL LOW (ref 150–400)
RBC: 3.58 MIL/uL — AB (ref 3.87–5.11)
RDW: 17.1 % — ABNORMAL HIGH (ref 11.5–15.5)
WBC: 9.6 10*3/uL (ref 4.0–10.5)

## 2014-04-19 LAB — BASIC METABOLIC PANEL
ANION GAP: 6 (ref 5–15)
BUN: 17 mg/dL (ref 6–23)
CALCIUM: 8.9 mg/dL (ref 8.4–10.5)
CO2: 26 mmol/L (ref 19–32)
CREATININE: 0.45 mg/dL — AB (ref 0.50–1.10)
Chloride: 104 mmol/L (ref 96–112)
GFR calc Af Amer: >90 mL/min (ref >90)
GFR calc non Af Amer: 89 mL/min — ABNORMAL LOW (ref >90)
Glucose, Bld: 104 mg/dL — ABNORMAL HIGH (ref 70–99)
Potassium: 5.1 mmol/L (ref 3.5–5.1)
Sodium: 136 mmol/L (ref 135–145)

## 2014-04-19 LAB — MAGNESIUM: Magnesium: 2.1 mg/dL (ref 1.5–2.5)

## 2014-04-19 MED ORDER — FERROUS SULFATE 325 (65 FE) MG PO TABS: 325.0000 mg | ORAL_TABLET | Freq: Three times a day (TID) | ORAL | Status: DC

## 2014-04-19 MED ORDER — TRAMADOL HCL 50 MG PO TABS: 50.0000 mg | ORAL_TABLET | Freq: Four times a day (QID) | ORAL | Status: DC | PRN

## 2014-04-19 MED ORDER — SENNOSIDES-DOCUSATE SODIUM 8.6-50 MG PO TABS
2.0000 | ORAL_TABLET | Freq: Two times a day (BID) | ORAL | Status: DC
  Administered 2014-04-19 – 2014-04-20 (×3): 2 via ORAL
  Filled 2014-04-19 (×3): qty 2

## 2014-04-19 MED ORDER — POLYETHYLENE GLYCOL 3350 17 G PO PACK: 17.0000 g | PACK | Freq: Every day | ORAL | Status: DC | PRN

## 2014-04-19 MED ORDER — ASPIRIN 325 MG PO TBEC: 325.0000 mg | DELAYED_RELEASE_TABLET | Freq: Two times a day (BID) | ORAL | Status: AC

## 2014-04-19 MED ORDER — DOCUSATE SODIUM 100 MG PO CAPS: 100.0000 mg | ORAL_CAPSULE | Freq: Two times a day (BID) | ORAL | Status: DC

## 2014-04-19 MED ORDER — TIZANIDINE HCL 4 MG PO TABS: 4.0000 mg | ORAL_TABLET | Freq: Four times a day (QID) | ORAL | Status: DC | PRN

## 2014-04-19 MED ORDER — ACETAMINOPHEN 325 MG PO TABS: 325.0000 mg | ORAL_TABLET | Freq: Four times a day (QID) | ORAL | Status: DC | PRN

## 2014-04-19 MED ORDER — ENSURE ENLIVE PO LIQD
237.0000 mL | Freq: Two times a day (BID) | ORAL | Status: DC
  Administered 2014-04-19 – 2014-04-20 (×3): 237 mL via ORAL

## 2014-04-19 NOTE — Progress Notes (Signed)
BRIEF NUTRITION NOTE   Pt being followed by RD at this time. Diet has been advanced since previous assessment and pt now on CLD. Will order Resource Breeze po TID, each supplement provides 250 kcal and 9 grams of protein, to supplement. Will continue to follow pt per protocol to assess needs and progress.  Jarome Matin, RD, LDN Inpatient Clinical Dietitian Pager # [phone] After hours/weekend pager # [phone]

## 2014-04-19 NOTE — Progress Notes (Signed)
Physical Therapy Treatment Patient Details Name: Victoria Sexton MRN: [id number] DOB: [birth year] Today's Date: 04/19/2014    History of Present Illness Pt is an 81 year old female admitted after fall sustaining R femoral neck fx, s/p R hip hemiarthroplasty.    PT Comments    Pt requiring increased assist for bed mobility.  Pt also requires increased time and cues for safety and precautions.  Pt only able to tolerate 16 feet this morning due to fatigue and pain.  Discussed concern for pt d/c home and spouse's ability to assist with pt's mobility and ADLs and recommended d/c to SNF for rehab however per CSW notes, pt and spouse continue to decline SNF and plan for home.  Pt has a few steps to enter home as well.   Follow Up Recommendations  SNF;Supervision/Assistance - 24 hour     Equipment Recommendations  None recommended by PT    Recommendations for Other Services       Precautions / Restrictions Precautions Precautions: Fall;Posterior Hip Restrictions RLE Weight Bearing: Weight bearing as tolerated    Mobility  Bed Mobility Overal bed mobility: Needs Assistance;+2 for physical assistance Bed Mobility: Supine to Sit     Supine to sit: Max assist;+2 for physical assistance;HOB elevated     General bed mobility comments: verbal cues for technique, increased assist for weakness and R hip pain  Transfers Overall transfer level: Needs assistance Equipment used: Rolling walker (2 wheeled) Transfers: Sit to/from Stand Sit to Stand: Mod assist;+2 physical assistance         General transfer comment: verbal cues for precautions and UE and LE positioning  Ambulation/Gait Ambulation/Gait assistance: Min assist;+2 safety/equipment Ambulation Distance (Feet): 16 Feet Assistive device: Rolling walker (2 wheeled) Gait Pattern/deviations: Step-to pattern;Antalgic     General Gait Details: verbal cues for sequence, RW distance, step length, increased time and effort to perform  gait, limited by pain and fatigue   Stairs            Wheelchair Mobility    Modified Rankin (Stroke Patients Only)       Balance                                    Cognition Arousal/Alertness: Awake/alert Behavior During Therapy: WFL for tasks assessed/performed Overall Cognitive Status: Within Functional Limits for tasks assessed                      Exercises      General Comments        Pertinent Vitals/Pain Pain Assessment: Faces Faces Pain Scale: Hurts even more Pain Location: R hip Pain Descriptors / Indicators: Sore;Aching Pain Intervention(s): Limited activity within patient's tolerance;Monitored during session;Repositioned    Home Living Family/patient expects to be discharged to:: Private residence Living Arrangements: Spouse/significant other           Home Equipment: Environmental consultant - 2 wheels Additional Comments: husband states he took care of wife when she had other leg surgery. He thinks she had THPs.  He will pick up a 3;1 commode himself as they didn't keep this.  Plans to borrow a wheelchair and plans to have her sleep in recliner, as she did before.  He slept on couch    Prior Function Level of Independence: Independent          PT Goals (current goals can now be found in the care plan  section) Acute Rehab PT Goals Patient Stated Goal: home Progress towards PT goals: Progressing toward goals    Frequency  Min 6X/week    PT Plan Current plan remains appropriate    Co-evaluation             End of Session Equipment Utilized During Treatment: Gait belt Activity Tolerance: Patient limited by fatigue;Patient limited by pain Patient left: in chair;with call bell/phone within reach;with family/visitor present;with chair alarm set     Time: 5643-3295 PT Time Calculation (min) (ACUTE ONLY): 26 min  Charges:  $Gait Training: 8-22 mins $Therapeutic Activity: 8-22 mins                    G Codes:       Victoria Sexton,Victoria Sexton 04/19/2014, 1:10 PM Carmelia Bake, PT, DPT 04/19/2014 Pager: [phone]

## 2014-04-19 NOTE — Progress Notes (Signed)
     Subjective: 1 Day Post-Op Procedure(s) (LRB): ARTHROPLASTY BIPOLAR HIP (Right)   Patient reports pain as moderate, pain controlled. More pain with moving. No events throughout the night.  Resting comfortably in bed now.  Objective:   VITALS:   Filed Vitals:   04/19/14 0448  BP: 150/76  Pulse: 74  Temp: 98.2 F (36.8 C)  Resp: 18    Dorsiflexion/Plantar flexion intact Incision: dressing C/D/I No cellulitis present Compartment soft  LABS  Recent Labs  04/17/14 0455 04/18/14 0439 04/19/14 0420  HGB 11.1* 10.0* 9.4*  HCT 35.4* 31.0* 29.8*  WBC 14.0* 8.4 9.6  PLT 182 122* 144*     Recent Labs  04/17/14 0455 04/18/14 0439 04/19/14 0420  NA 135 135 136  K 3.6 3.3* 5.1  BUN 11 11 17   CREATININE 0.61 0.51 0.45*  GLUCOSE 130* 153* 104*     Assessment/Plan: 2 Days Post-Op Procedure(s) (LRB): ARTHROPLASTY BIPOLAR HIP (Right) Up with therapy Discharge home with home health eventually, when ready   Ortho recommendations:  ASA 325 mg bid for 4 weeks for anticoagulation, unless other medically indicated.  Tramadol and APAP for pain management (Rx written).  Zanaflex for muscle spasms (Rx written).  MiraLax and Colace for constipation  Iron 325 mg tid for 2-3 weeks   WBAT on the right leg.  Dressing to remain in place until follow in clinic in 2 weeks.  Dressing is waterproof and may shower with it in place.  Follow up in 2 weeks at [facility]. Follow up with OLIN,Mishael Haran D in 2 weeks.  Contact information:  [facility] [address] [place]   PAC  04/19/2014, 7:32 AM

## 2014-04-19 NOTE — Progress Notes (Signed)
CSW spoke with patient & husband at bedside after PT worked with patient, patient & husband states that they are still planning to go home at discharge. CSW left SNF list, map & CSW phone number incase they change their minds.   No further CSW needs identified - CSW signing off.   Raynaldo Opitz, [facility] Clinical Social Worker cell #: [phone]

## 2014-04-19 NOTE — Progress Notes (Signed)
SUBJECTIVE:  No complaints  OBJECTIVE:   Vitals:   Filed Vitals:   04/18/14 1446 04/18/14 1600 04/18/14 2030 04/19/14 0448  BP: 126/58  114/60 150/76  Pulse: 67  72 74  Temp: 98.2 F (36.8 C)  97.6 F (36.4 C) 98.2 F (36.8 C)  TempSrc: Oral  Oral Oral  Resp: 18 18 18 18   Height:      Weight:    124 lb 1.9 oz (56.3 kg)  SpO2: 100% 100% 96% 100%   I&O's:   Intake/Output Summary (Last 24 hours) at 04/19/14 0838 Last data filed at 04/19/14 0641  Gross per 24 hour  Intake 1553.33 ml  Output    750 ml  Net 803.33 ml   TELEMETRY: Reviewed telemetry pt in NSR:     PHYSICAL EXAM General: Well developed, well nourished, in no acute distress Head: Eyes PERRLA, No xanthomas.   Normal cephalic and atramatic  Lungs:   Clear bilaterally to auscultation and percussion. Heart:   HRRR S1 S2 Pulses are 2+ & equal. 2/6 SM at LLSB to apex Abdomen: Bowel sounds are positive, abdomen soft and non-tender without masses  Extremities:   No clubbing, cyanosis or edema.  DP +1 Neuro: Alert and oriented X 3. Psych:  Good affect, responds appropriately   LABS: Basic Metabolic Panel:  Recent Labs  04/18/14 0439 04/19/14 0420  NA 135 136  K 3.3* 5.1  CL 105 104  CO2 23 26  GLUCOSE 153* 104*  BUN 11 17  CREATININE 0.51 0.45*  CALCIUM 8.2* 8.9  MG  --  2.1   Liver Function Tests: No results for input(s): AST, ALT, ALKPHOS, BILITOT, PROT, ALBUMIN in the last 72 hours. No results for input(s): LIPASE, AMYLASE in the last 72 hours. CBC:  Recent Labs  04/16/14 1920  04/18/14 0439 04/19/14 0420  WBC 11.0*  < > 8.4 9.6  NEUTROABS 7.8*  --   --   --   HGB 11.5*  < > 10.0* 9.4*  HCT 37.5  < > 31.0* 29.8*  MCV 83.1  < > 82.0 83.2  PLT 168  < > 122* 144*  < > = values in this interval not displayed. Cardiac Enzymes: No results for input(s): CKTOTAL, CKMB, CKMBINDEX, TROPONINI in the last 72 hours. BNP: Invalid input(s): POCBNP D-Dimer: No results for input(s): DDIMER in the  last 72 hours. Hemoglobin A1C: No results for input(s): HGBA1C in the last 72 hours. Fasting Lipid Panel: No results for input(s): CHOL, HDL, LDLCALC, TRIG, CHOLHDL, LDLDIRECT in the last 72 hours. Thyroid Function Tests: No results for input(s): TSH, T4TOTAL, T3FREE, THYROIDAB in the last 72 hours.  Invalid input(s): FREET3 Anemia Panel: No results for input(s): VITAMINB12, FOLATE, FERRITIN, TIBC, IRON, RETICCTPCT in the last 72 hours. Coag Panel:   Lab Results  Component Value Date   INR 1.00 04/16/2014   INR 2.21* 10/19/2011   INR 2.06* 10/18/2011    RADIOLOGY: Dg Chest 1 View  04/16/2014   CLINICAL DATA:  Preop hip fracture. History of CABG and hypertension.  EXAM: CHEST  1 VIEW  COMPARISON:  12/29/2013  FINDINGS: Prior CABG. Large hiatal hernia. Heart is borderline in size. Diffuse interstitial prominence throughout the lungs, increased since prior study. No effusions or acute bony abnormality.  IMPRESSION: Increasing diffuse interstitial prominence throughout the lungs which could reflect chronic interstitial lung disease. It is difficult to completely exclude superimposed edema.   Electronically Signed   By: Rolm Baptise M.D.   On:  04/16/2014 19:54   Pelvis Portable  04/18/2014   CLINICAL DATA:  Status post right hip arthroplasty  EXAM: PORTABLE PELVIS 1-2 VIEWS  COMPARISON:  None.  FINDINGS: Located right hip hemiarthroplasty. The femoral stem is incompletely imaged; no evidence of periprosthetic fracture.  Profound osteopenia.  The left hip is located and intact.  Expected subcutaneous gas around the right hip.  IMPRESSION: 1. New right hip hemiarthroplasty without adverse finding. 2. Incomplete visualization of the femoral stem. Will gladly obtain additional views if desired.   Electronically Signed   By: Monte Fantasia M.D.   On: 04/18/2014 02:24   Dg Hip Unilat  With Pelvis 2-3 Views Right  04/16/2014   CLINICAL DATA:  Hip pain.  Fall onto right hip.  EXAM: RIGHT HIP (WITH  PELVIS) 2-3 VIEWS  COMPARISON:  None.  FINDINGS: There is a mildly impacted femoral neck fracture on the right. No subluxation or dislocation. Diffuse osteopenia. Left hip joint and SI joints are unremarkable.  IMPRESSION: Mildly impacted right femoral neck fracture.   Electronically Signed   By: Rolm Baptise M.D.   On: 04/16/2014 18:21    ASSESSMENT AND PLAN:  Femoral neck fracture s/p repair.   MVR and no MAZE- no atrial arrhythmias post op on BB and IV Amio. Amio now off.  Echo pending to evaluate heart murmur.  This has not been done yet and can be done as an outpt.  Followup outpt with Dr. Marlou Porch.   ASCAD s/p CABG with no angina. Continue ASA/BB and statin.   Hypokalemia - repleted    Sueanne Margarita, MD  04/19/2014  8:38 AM

## 2014-04-19 NOTE — Evaluation (Signed)
Occupational Therapy Evaluation Patient Details Name: Victoria Sexton MRN: [id number] DOB: [birth year] Today's Date: 04/19/2014    History of Present Illness Pt is an 81 year old female admitted after fall sustaining R femoral neck fx, s/p R hip hemiarthroplasty.   Clinical Impression   Pt was admitted for the above.  She will benefit from skilled OT to increase safety and independence with adls.  Husband plans to assist pt at home.  Focus of OT will be on safely assisting her following posterior THPs and pt performing toilet transfers at min A level. Pt currently needs mod A for sit to stand and has difficulty advancing and stepping backwards.      Follow Up Recommendations  SNF (if home, recommend HHOT)    Equipment Recommendations   (husband will get 3:1 himself)    Recommendations for Other Services       Precautions / Restrictions Precautions Precautions: Fall;Posterior Hip Restrictions RLE Weight Bearing: Weight bearing as tolerated      Mobility Bed Mobility                  Transfers   Equipment used: Rolling walker (2 wheeled) Transfers: Sit to/from Stand Sit to Stand: Mod assist         General transfer comment: VCs for precautions, UE and LE placement.  Pt able to scoot forward/backwards in chair with min A and extra time;     Balance                                            ADL Overall ADL's : Needs assistance/impaired     Grooming: Set up;Sitting   Upper Body Bathing: Minimal assitance;Sitting   Lower Body Bathing: Maximal assistance;Sit to/from stand   Upper Body Dressing : Minimal assistance;Sitting   Lower Body Dressing: Total assistance;Sit to/from stand                 General ADL Comments: Reviewed THPs with pt and husband.  Husband feels confident that he can assist pt at home:  reviewed that extending leg when sitting and standing is for 90 degree angle.  Husband will assist pt:  not interested in AE.  Pt  needs reinforcement with THPs--does not recall.  Pt had difficulty stepping backwards to chair.  Husband is a little HOH--will try to review precautions again.     Vision     Perception     Praxis      Pertinent Vitals/Pain Pain Assessment: Faces Faces Pain Scale: Hurts little more Pain Location: R hip Pain Descriptors / Indicators: Sore Pain Intervention(s): Limited activity within patient's tolerance;Monitored during session;Premedicated before session;Repositioned     Hand Dominance     Extremity/Trunk Assessment Upper Extremity Assessment Upper Extremity Assessment: Generalized weakness (RUE sore from fall; strength grossly 3+/5)           Communication Communication Communication: No difficulties   Cognition Arousal/Alertness: Awake/alert Behavior During Therapy: WFL for tasks assessed/performed Overall Cognitive Status: Within Functional Limits for tasks assessed                     General Comments       Exercises       Shoulder Instructions      Home Living Family/patient expects to be discharged to:: Private residence Living Arrangements: Spouse/significant other  Home Equipment: [place] - 2 wheels   Additional Comments: husband states he took care of wife when she had other leg surgery. He thinks she had THPs.  He will pick up a 3;1 commode himself as they didn't keep this.  Plans to borrow a wheelchair and plans to have her sleep in recliner, as she did before.  He slept on couch      Prior Functioning/Environment Level of Independence: Independent             OT Diagnosis: Generalized weakness;Acute pain   OT Problem List: Decreased strength;Decreased activity tolerance;Impaired balance (sitting and/or standing);Decreased cognition;Pain   OT Treatment/Interventions: Self-care/ADL training;DME and/or AE instruction;Patient/family education    OT Goals(Current goals can be found in the care plan  section) Acute Rehab OT Goals Patient Stated Goal: home OT Goal Formulation: With patient Time For Goal Achievement: 04/26/14 Potential to Achieve Goals: Good ADL Goals Pt Will Transfer to Toilet: with min assist;ambulating;bedside commode (cues for thps) Additional ADL Goal #1: Husband will be independent assisting and guarding pt when standing for ADLs and performing toilet transfers  OT Frequency: Min 2X/week   Barriers to D/C:            Co-evaluation              End of Session    Activity Tolerance: Patient tolerated treatment well Patient left: in chair;with call bell/phone within reach;with family/visitor present   Time: 8675-4492 OT Time Calculation (min): 27 min Charges:  OT General Charges $OT Visit: 1 Procedure OT Evaluation $Initial OT Evaluation Tier I: 1 Procedure OT Treatments $Therapeutic Activity: 8-22 mins G-Codes:    Artesha Wemhoff 2014-05-11, 12:52 PM Lesle Chris, OTR/L [phone] May 11, 2014

## 2014-04-19 NOTE — Progress Notes (Signed)
     Subjective: 2 Days Post-Op Procedure(s) (LRB): ARTHROPLASTY BIPOLAR HIP (Right)   Patient reports pain as moderate, pain controlled. No events throughout the night.   Objective:   VITALS:   Filed Vitals:   04/19/14 0448  BP: 150/76  Pulse: 74  Temp: 98.2 F (36.8 C)  Resp: 18    Dorsiflexion/Plantar flexion intact Incision: dressing C/D/I No cellulitis present Compartment soft  LABS  Recent Labs  04/17/14 0455 04/18/14 0439 04/19/14 0420  HGB 11.1* 10.0* 9.4*  HCT 35.4* 31.0* 29.8*  WBC 14.0* 8.4 9.6  PLT 182 122* 144*     Recent Labs  04/17/14 0455 04/18/14 0439 04/19/14 0420  NA 135 135 136  K 3.6 3.3* 5.1  BUN 11 11 17   CREATININE 0.61 0.51 0.45*  GLUCOSE 130* 153* 104*     Assessment/Plan: 2 Days Post-Op Procedure(s) (LRB): ARTHROPLASTY BIPOLAR HIP (Right) Up with therapy Discharge home with home health eventually, when ready  Ortho recommendations:  ASA 325 mg bid for 4 weeks for anticoagulation, unless other medically indicated.  Tramadol and APAP for pain management (Rx written).  Zanaflex for muscle spasms (Rx written).  MiraLax and Colace for constipation  Iron 325 mg tid for 2-3 weeks   WBAT on the right leg.  Dressing to remain in place until follow in clinic in 2 weeks.  Dressing is waterproof and may shower with it in place.  Follow up in 2 weeks at [facility]. Follow up with OLIN,Iker Nuttall D in 2 weeks.  Contact information:  [facility] [address] [place]   PAC  04/19/2014, 8:34 AM

## 2014-04-19 NOTE — Progress Notes (Signed)
Patient Demographics  Victoria Sexton, is a 81 y.o. female, DOB - 02/21/1927, VXB:[id number]  Admit date - 04/16/2014   Admitting Physician Allyne Gee, MD  Outpatient Primary MD for the patient is Vidal Schwalbe, MD  LOS - 3   Chief Complaint  Patient presents with  . Fall  . Hip Pain      Admission history of present illness/brief narrative:  HPI: Victoria Sexton is a 81 y.o. female presents with a hip fracture. She was at a blood drive and tripped over a chair leg while getting some water for a donor. She states that she fell on her right side and felt pain. She was unable to ambulate afterwards. Patient was found to have a femoral neck fracture here in the ED. She does have a history of CHF CAD and also has a history of HTN. Patient has no chest pain no shortness of breath. She has no current chest pain. She did not lose consciousness after the fall. She has no nausea or vomiting. Her husband states that she has been treated for iron deficiency in the past.  Patient had right hip hemiarthroplasty done by Dr.Olin 04/17/14.  Subjective:   Victoria Sexton today has, No headache, No chest pain, No abdominal pain - No Nausea, No new weakness tingling or numbness, No Cough - SOB. Reports she feels much better today.  Assessment & Plan    Principal Problem:   Fracture of femoral neck, right Active Problems:   Essential hypertension, benign   Coronary artery disease   Congestive heart failure   Femoral neck fracture   Preop cardiovascular exam   H/O mitral valve repair  Right hip fracture - Status post right hip hemiarthroplasty 04/17/14 by Dr.Olin. - PT to follow.  Hypertension - Acceptable on home medication lisinopril and metoprolol  Hyperlipidemia - Continue with statin  Chronic diastolic CHF -  compensated, monitor closely.  History of MVR - No atrial  arrhythmias postoperatively on beta blockers and IV amiodarone, continue with telemetry, if no further A. fib will discontinue amiodarone tomorrow as per cardiology recommendation. - 2-D echo pending.  History of coronary artery disease - Denies any chest pain or shortness of breath, continue aspirin, beta blockers and statin.  Hypokalemia -  Today potassium is on the upper limit,  Recheck in a.m.   Code Status: full  Family Communication:  husbandat bedside  Disposition Plan: patient and  Husband request  Be discharged home,  They do not want to go to skilled nursing facility.   Procedures  None   Consults   Orthopedic   cardiology   Medications  Scheduled Meds: . aspirin EC  325 mg Oral BID  . docusate sodium  100 mg Oral BID  . feeding supplement (ENSURE ENLIVE)  237 mL Oral BID BM  . ferrous sulfate  325 mg Oral TID PC  . lisinopril  5 mg Oral BID  . metoprolol tartrate  25 mg Oral BID  . senna-docusate  2 tablet Oral BID  . simvastatin  5 mg Oral Daily   Continuous Infusions: . sodium chloride 50 mL/hr at 04/17/14 0838   PRN Meds:.alum & mag hydroxide-simeth, bisacodyl, fentaNYL (SUBLIMAZE) injection, HYDROmorphone (DILAUDID) injection, menthol-cetylpyridinium **OR** phenol, methocarbamol **OR**  methocarbamol (ROBAXIN)  IV, metoCLOPramide **OR** metoCLOPramide (REGLAN) injection, ondansetron **OR** ondansetron (ZOFRAN) IV, oxyCODONE, polyethylene glycol, traMADol  DVT Prophylaxis   SCDs, aspirin 325 mg oral twice a day  Lab Results  Component Value Date   PLT 144* 04/19/2014    Antibiotics    Anti-infectives    Start     Dose/Rate Route Frequency Ordered Stop   04/18/14 0300  ceFAZolin (ANCEF) IVPB 1 g/50 mL premix     1 g 100 mL/hr over 30 Minutes Intravenous Every 6 hours 04/17/14 2259 04/18/14 0908   04/17/14 2045  ceFAZolin (ANCEF) IVPB 2 g/50 mL premix     2 g 100 mL/hr over 30 Minutes Intravenous  Once 04/17/14 2041 04/17/14 2050           Objective:   Filed Vitals:   04/18/14 1446 04/18/14 1600 04/18/14 2030 04/19/14 0448  BP: 126/58  114/60 150/76  Pulse: 67  72 74  Temp: 98.2 F (36.8 C)  97.6 F (36.4 C) 98.2 F (36.8 C)  TempSrc: Oral  Oral Oral  Resp: 18 18 18 18   Height:      Weight:    56.3 kg (124 lb 1.9 oz)  SpO2: 100% 100% 96% 100%    Wt Readings from Last 3 Encounters:  04/19/14 56.3 kg (124 lb 1.9 oz)  01/09/14 43.545 kg (96 lb)  12/29/13 42.9 kg (94 lb 9.2 oz)     Intake/Output Summary (Last 24 hours) at 04/19/14 1433 Last data filed at 04/19/14 1100  Gross per 24 hour  Intake 1673.33 ml  Output    750 ml  Net 923.33 ml     Physical Exam  Awake Alert, Oriented X 3, No new F.N deficits, Normal affect [place].AT,PERRAL Supple Neck,No JVD, No cervical lymphadenopathy appriciated.  Symmetrical Chest wall movement, Good air movement bilaterally, CTAB RRR,No Gallops,Rubs or new Murmurs, No Parasternal Heave +ve B.Sounds, Abd Soft, No tenderness, No organomegaly appriciated, No rebound - guarding or rigidity. No Cyanosis, Clubbing or edema, No new Rash or bruise  , good pulses bilaterally.   Data Review   Micro Results Recent Results (from the past 240 hour(s))  Culture, Urine     Status: None   Collection Time: 04/17/14  8:52 AM  Result Value Ref Range Status   Specimen Description URINE, CATHETERIZED  Final   Special Requests Normal  Final   Colony Count NO GROWTH Performed at [facility]   Final   Culture NO GROWTH Performed at [facility]   Final   Report Status 04/18/2014 FINAL  Final  Surgical pcr screen     Status: None   Collection Time: 04/17/14  3:44 PM  Result Value Ref Range Status   MRSA, PCR NEGATIVE NEGATIVE Final   Staphylococcus aureus NEGATIVE NEGATIVE Final    Comment:        The Xpert SA Assay (FDA approved for NASAL specimens in patients over 87 years of age), is one component of a comprehensive surveillance program.  Test performance  has been validated by [facility] for patients greater than or equal to 22 year old. It is not intended to diagnose infection nor to guide or monitor treatment.     Radiology Reports Pelvis Portable  04/18/2014   CLINICAL DATA:  Status post right hip arthroplasty  EXAM: PORTABLE PELVIS 1-2 VIEWS  COMPARISON:  None.  FINDINGS: Located right hip hemiarthroplasty. The femoral stem is incompletely imaged; no evidence of periprosthetic fracture.  Profound osteopenia.  The left hip is located and intact.  Expected subcutaneous gas around the right hip.  IMPRESSION: 1. New right hip hemiarthroplasty without adverse finding. 2. Incomplete visualization of the femoral stem. Will gladly obtain additional views if desired.   Electronically Signed   By: Monte Fantasia M.D.   On: 04/18/2014 02:24    CBC  Recent Labs Lab 04/16/14 1920 04/17/14 0455 04/18/14 0439 04/19/14 0420  WBC 11.0* 14.0* 8.4 9.6  HGB 11.5* 11.1* 10.0* 9.4*  HCT 37.5 35.4* 31.0* 29.8*  PLT 168 182 122* 144*  MCV 83.1 82.5 82.0 83.2  MCH 25.5* 25.9* 26.5 26.3  MCHC 30.7 31.4 32.3 31.5  RDW 16.9* 17.1* 16.9* 17.1*  LYMPHSABS 2.8  --   --   --   MONOABS 0.4  --   --   --   EOSABS 0.0  --   --   --   BASOSABS 0.0  --   --   --     Chemistries   Recent Labs Lab 04/16/14 1920 04/17/14 0455 04/18/14 0439 04/19/14 0420  NA 135 135 135 136  K 3.5 3.6 3.3* 5.1  CL 102 102 105 104  CO2 25 24 23 26   GLUCOSE 108* 130* 153* 104*  BUN 9 11 11 17   CREATININE 0.61 0.61 0.51 0.45*  CALCIUM 8.6 8.6 8.2* 8.9  MG  --   --   --  2.1   ------------------------------------------------------------------------------------------------------------------ estimated creatinine clearance is 41.2 mL/min (by C-G formula based on Cr of 0.45). ------------------------------------------------------------------------------------------------------------------ No results for input(s): HGBA1C in the last 72  hours. ------------------------------------------------------------------------------------------------------------------ No results for input(s): CHOL, HDL, LDLCALC, TRIG, CHOLHDL, LDLDIRECT in the last 72 hours. ------------------------------------------------------------------------------------------------------------------ No results for input(s): TSH, T4TOTAL, T3FREE, THYROIDAB in the last 72 hours.  Invalid input(s): FREET3 ------------------------------------------------------------------------------------------------------------------ No results for input(s): VITAMINB12, FOLATE, FERRITIN, TIBC, IRON, RETICCTPCT in the last 72 hours.  Coagulation profile  Recent Labs Lab 04/16/14 1920  INR 1.00    No results for input(s): DDIMER in the last 72 hours.  Cardiac Enzymes No results for input(s): CKMB, TROPONINI, MYOGLOBIN in the last 168 hours.  Invalid input(s): CK ------------------------------------------------------------------------------------------------------------------ Invalid input(s): POCBNP     Time Spent in minutes   30 minutes   Tenzin Pavon M.D on 04/19/2014 at 2:33 PM  Between 7am to 7pm - Pager - [phone]  After 7pm go to [email or web address] - password [email or web address]  And look for the night coverage person covering for me after hours  [facility] Office  [phone]   **Disclaimer: This note may have been dictated with voice recognition software. Similar sounding words can inadvertently be transcribed and this note may contain transcription errors which may not have been corrected upon publication of note.**

## 2014-04-19 NOTE — Progress Notes (Addendum)
Spoke to [employer] who questioned patient's continued need for foley catheter as initial post op orders called for removal post op day one. No clear documentation by ortho or medicine regarding reason for continuation of foley catheter into post op day two. From ortho standpoint she is WBAT following her surgery and thus has ability to ambulate to restroom. However, due to patient's history of CHF, will have RN keep foley this evening in case medicine is following strict I&Os. Will defer decision about removal of foley catheter to medicine post op morning three.    Ardeen Jourdain, PA-C

## 2014-04-19 NOTE — Discharge Instructions (Signed)

## 2014-04-19 NOTE — Progress Notes (Signed)
Pt selected [facility] for [facility].

## 2014-04-20 LAB — BASIC METABOLIC PANEL
Anion gap: 5 (ref 5–15)
BUN: 14 mg/dL (ref 6–23)
CO2: 26 mmol/L (ref 19–32)
Calcium: 8.2 mg/dL — ABNORMAL LOW (ref 8.4–10.5)
Chloride: 105 mmol/L (ref 96–112)
Creatinine, Ser: 0.49 mg/dL — ABNORMAL LOW (ref 0.50–1.10)
GFR, EST NON AFRICAN AMERICAN: 87 mL/min — AB (ref >90)
GLUCOSE: 102 mg/dL — AB (ref 70–99)
Potassium: 4.1 mmol/L (ref 3.5–5.1)
SODIUM: 136 mmol/L (ref 135–145)

## 2014-04-20 MED ORDER — ENSURE PO LIQD: 237.0000 mL | Freq: Three times a day (TID) | ORAL | Status: DC

## 2014-04-20 NOTE — Discharge Summary (Signed)
Victoria Sexton, 81 y.o., DOB April 05, 1927, MRN [id number]. Admission date: 04/16/2014 Discharge Date 04/20/2014 Primary MD Vidal Schwalbe, MD Admitting Physician Victoria Gee, MD  PCP please follow on: - check CBC, BMP during next visit. - Check on aspirin DVT prophylaxis dose 325 mg oral twice a day for 4 weeks, these change to baby aspirin after that.  Admission Diagnosis  Hip pain [M25.559] Femoral neck fracture, right, closed, initial encounter [S72.001A]  Discharge Diagnosis   Principal Problem:   Fracture of femoral neck, right Active Problems:   Essential hypertension, benign   Coronary artery disease   Congestive heart failure   Femoral neck fracture   Preop cardiovascular exam   H/O mitral valve repair      Past Medical History  Diagnosis Date  . Hypertension   . MVP (mitral valve prolapse)   . Osteoporosis   . Inguinal hernia right  . Mitral regurgitation due to cusp prolapse   . Cardiac tumor, ventricular     LVOT  . PVC (premature ventricular contraction)   . Hiatal hernia   . Coronary artery disease 10/04/2011  . Congestive heart failure 10/04/2011  . Heart murmur   . Shortness of breath   . Anginal pain   . Hernia, hiatal     Left wears a hernia belt  . Skin cancer 2013    Face.  . S/P mitral valve repair 10/12/2011    Complex valvuloplasty including triangular resection of flail posterior leaflet with artificial Goretex neocord placement x2 and 26 mm Sorin Memo 3D ring annuloplasty  . S/P CABG x 3 10/12/2011    LIMA to LAD, SVG to OM, SVG to RCA, EVH via left thigh and leg  . Thoracic compression fracture   . Hx of CABG     -LIMA to LAD,SVG to RCA, SVG to OM, mitral valve repair, patent foramen ovale closure, benign-apearing mass removal.  . Aortic atherosclerosis 10/25/2012    Upper aortic atherosclerosis seen on CT scan 08/13/2002  . Mild malnutrition 10/25/2012    Past Surgical History  Procedure Laterality Date  . Abdominal hysterectomy    . Leg fx  repair      Right  . Hemorroidectomy    . Tee without cardioversion  11/13/2010    Procedure: TRANSESOPHAGEAL ECHOCARDIOGRAM (TEE);  Surgeon: Candee Furbish, MD;  Location: [facility] ENDOSCOPY;  Service: Cardiovascular;  Laterality: N/A;  . Skin cancer excision  2013    not melanoma  . Hemorrhoid surgery    . Mitral valve repair  10/12/2011    Procedure: MITRAL VALVE REPAIR (MVR);  Surgeon: Rexene Alberts, MD;  Location: [place];  Service: Open Heart Surgery;  Laterality: N/A;  MITRAL VALVE REPAIR  . Coronary artery bypass graft  10/12/2011    Procedure: CORONARY ARTERY BYPASS GRAFTING (CABG);  Surgeon: Rexene Alberts, MD;  Location: [place];  Service: Open Heart Surgery;  Laterality: N/A;  RESECTION OF LV MASS  . Patent foramen ovale closure  10/12/2011    Procedure: PATENT FORAMEN OVALE CLOSURE;  Surgeon: Rexene Alberts, MD;  Location: [place];  Service: Open Heart Surgery;  Laterality: N/A;  . Left and right heart catheterization with coronary angiogram N/A 09/29/2011    Procedure: LEFT AND RIGHT HEART CATHETERIZATION WITH CORONARY ANGIOGRAM;  Surgeon: Candee Furbish, MD;  Location: [facility] CATH LAB;  Service: Cardiovascular;  Laterality: N/A;  . Hip arthroplasty Right 04/17/2014    Procedure: ARTHROPLASTY BIPOLAR HIP;  Surgeon: Paralee Cancel, MD;  Location: [facility] ORS;  Service: Orthopedics;  Laterality: Right;     Hospital Course See H&P, Labs, Consult and Test reports for all details in brief, patient was admitted for **  Principal Problem:   Fracture of femoral neck, right Active Problems:   Essential hypertension, benign   Coronary artery disease   Congestive heart failure   Femoral neck fracture   Preop cardiovascular exam   H/O mitral valve repair  HPI: Victoria Sexton is a 81 y.o. female presents with a hip fracture. She was at a blood drive and tripped over a chair leg while getting some water for a donor. She states that she fell on her right side and felt pain. She was unable to ambulate afterwards.  Patient was found to have a femoral neck fracture here in the ED. She does have a history of CHF CAD and also has a history of HTN. Patient has no chest pain no shortness of breath. She has no current chest pain. She did not lose consciousness after the fall. She has no nausea or vomiting. Her husband states that she has been treated for iron deficiency in the past.  Patient had right hip hemiarthroplasty done by Dr.Olin 04/17/14.  Right hip fracture - Status post right hip hemiarthroplasty 04/17/14 by Dr.Olin. - PT/OT as an outpatient - Discussed with patient and wife, they do not want to be discharged to skilled nursing facility, so home care was arranged for discharge.  Hypertension - Resumed on home medication lisinopril and metoprolol  Hyperlipidemia - Continue with statin  Chronic diastolic CHF - compensated,   History of MVR - No atrial arrhythmias postoperatively on beta blockers, started on  on IV amiodarone empirically to prevent paroxysmal active fibrillation.  History of coronary artery disease - Denies any chest pain or shortness of breath, continue aspirin, beta blockers and statin.    Consults   Orthopedic Cardiology Procedures - Right hip hemiarthroplasty 4/13  Significant Tests:  See full reports for all details    Dg Chest 1 View  04/16/2014   CLINICAL DATA:  Preop hip fracture. History of CABG and hypertension.  EXAM: CHEST  1 VIEW  COMPARISON:  12/29/2013  FINDINGS: Prior CABG. Large hiatal hernia. Heart is borderline in size. Diffuse interstitial prominence throughout the lungs, increased since prior study. No effusions or acute bony abnormality.  IMPRESSION: Increasing diffuse interstitial prominence throughout the lungs which could reflect chronic interstitial lung disease. It is difficult to completely exclude superimposed edema.   Electronically Signed   By: Rolm Baptise M.D.   On: 04/16/2014 19:54   Pelvis Portable  04/18/2014   CLINICAL DATA:  Status  post right hip arthroplasty  EXAM: PORTABLE PELVIS 1-2 VIEWS  COMPARISON:  None.  FINDINGS: Located right hip hemiarthroplasty. The femoral stem is incompletely imaged; no evidence of periprosthetic fracture.  Profound osteopenia.  The left hip is located and intact.  Expected subcutaneous gas around the right hip.  IMPRESSION: 1. New right hip hemiarthroplasty without adverse finding. 2. Incomplete visualization of the femoral stem. Will gladly obtain additional views if desired.   Electronically Signed   By: Monte Fantasia M.D.   On: 04/18/2014 02:24   Dg Hip Unilat  With Pelvis 2-3 Views Right  04/16/2014   CLINICAL DATA:  Hip pain.  Fall onto right hip.  EXAM: RIGHT HIP (WITH PELVIS) 2-3 VIEWS  COMPARISON:  None.  FINDINGS: There is a mildly impacted femoral neck fracture on the right. No subluxation or dislocation. Diffuse osteopenia. Left hip  joint and SI joints are unremarkable.  IMPRESSION: Mildly impacted right femoral neck fracture.   Electronically Signed   By: Rolm Baptise M.D.   On: 04/16/2014 18:21     Today   Subjective:   Victoria Sexton today has no headache,no chest abdominal pain,no new weakness tingling or numbness, feels much better wants to go home today.   Objective:   Blood pressure 124/55, pulse 106, temperature 98.6 F (37 C), temperature source Oral, resp. rate 16, height 5' (1.524 m), weight 58.3 kg (128 lb 8.5 oz), SpO2 96 %.  Intake/Output Summary (Last 24 hours) at 04/20/14 1137 Last data filed at 04/20/14 0850  Gross per 24 hour  Intake    480 ml  Output   1200 ml  Net   -720 ml    Exam Awake Alert, Oriented X 3, No new F.N deficits, Normal affect [place].AT,PERRAL Supple Neck,No JVD, No cervical lymphadenopathy appriciated.  Symmetrical Chest wall movement, Good air movement bilaterally, CTAB RRR,No Gallops,Rubs or new Murmurs, No Parasternal Heave +ve B.Sounds, Abd Soft, No tenderness, No organomegaly appriciated, No rebound - guarding or rigidity. No  Cyanosis, Clubbing or edema, No new Rash or bruise , good pulses bilaterally.  Data Review   Cultures -   CBC w Diff:  Lab Results  Component Value Date   WBC 9.6 04/19/2014   HGB 9.4* 04/19/2014   HCT 29.8* 04/19/2014   PLT 144* 04/19/2014   LYMPHOPCT 26 04/16/2014   MONOPCT 3 04/16/2014   EOSPCT 0 04/16/2014   BASOPCT 0 04/16/2014   CMP:  Lab Results  Component Value Date   NA 136 04/20/2014   K 4.1 04/20/2014   CL 105 04/20/2014   CO2 26 04/20/2014   BUN 14 04/20/2014   CREATININE 0.49* 04/20/2014   PROT 7.6 12/29/2013   ALBUMIN 4.2 12/29/2013   BILITOT 0.4 12/29/2013   ALKPHOS 70 12/29/2013   AST 28 12/29/2013   ALT 13 12/29/2013  .  Micro Results Recent Results (from the past 240 hour(s))  Culture, Urine     Status: None   Collection Time: 04/17/14  8:52 AM  Result Value Ref Range Status   Specimen Description URINE, CATHETERIZED  Final   Special Requests Normal  Final   Colony Count NO GROWTH Performed at [facility]   Final   Culture NO GROWTH Performed at [facility]   Final   Report Status 04/18/2014 FINAL  Final  Surgical pcr screen     Status: None   Collection Time: 04/17/14  3:44 PM  Result Value Ref Range Status   MRSA, PCR NEGATIVE NEGATIVE Final   Staphylococcus aureus NEGATIVE NEGATIVE Final    Comment:        The Xpert SA Assay (FDA approved for NASAL specimens in patients over 73 years of age), is one component of a comprehensive surveillance program.  Test performance has been validated by [facility] for patients greater than or equal to 22 year old. It is not intended to diagnose infection nor to guide or monitor treatment.      Discharge Instructions          Follow-up Information    Follow up with Mauri Pole, MD. Schedule an appointment as soon as possible for a visit in 2 weeks.   Specialty:  Orthopedic Surgery   Contact information:   [address] [place]  [phone]       Follow up with Vidal Schwalbe, MD.  Schedule an appointment as soon as possible for a visit in 1 week.   Specialty:  Family Medicine   Why:  Posthospitalization follow-up   Contact information:   [place] [address] [phone]       Follow up with Candee Furbish, MD. Call in 4 weeks.   Specialty:  Cardiology   Contact information:   [address] [place] [address] [phone]       Discharge Medications     Medication List    STOP taking these medications        aspirin 81 MG tablet  Replaced by:  aspirin 325 MG EC tablet     iron polysaccharides 150 MG capsule  Commonly known as:  NIFEREX      TAKE these medications        acetaminophen 325 MG tablet  Commonly known as:  TYLENOL  Take 1-2 tablets (325-650 mg total) by mouth every 6 (six) hours as needed.     amoxicillin 500 MG capsule  Commonly known as:  AMOXIL  TAKE 4 CAPSULES ONE HOUR PRIOR TO DENTAL PROCEDURE.     aspirin 325 MG EC tablet  Take 1 tablet (325 mg total) by mouth 2 (two) times daily.     Co Q-10 50 MG Caps  Take 50 mg by mouth daily.     docusate sodium 100 MG capsule  Commonly known as:  COLACE  Take 1 capsule (100 mg total) by mouth 2 (two) times daily.     ENSURE  Take 237 mLs by mouth 3 (three) times daily between meals.     ferrous sulfate 325 (65 FE) MG tablet  Take 1 tablet (325 mg total) by mouth 3 (three) times daily after meals.     lisinopril 5 MG tablet  Commonly known as:  PRINIVIL,ZESTRIL  TAKE 1 TABLET ONCE DAILY.     metoprolol succinate 50 MG 24 hr tablet  Commonly known as:  TOPROL-XL  Take 1 tablet (50 mg total) by mouth 2 (two) times daily. Take with or immediately following a meal.     multivitamin-lutein Caps capsule  Take 1 capsule by mouth daily. 6MG      OVER THE COUNTER MEDICATION  Take 1 tablet by mouth daily. Omega Red     polyethylene glycol packet  Commonly known  as:  MIRALAX / GLYCOLAX  Take 17 g by mouth daily as needed for mild constipation.     simvastatin 5 MG tablet  Commonly known as:  ZOCOR  TAKE 1 TABLET ONCE DAILY.     tiZANidine 4 MG tablet  Commonly known as:  ZANAFLEX  Take 1 tablet (4 mg total) by mouth every 6 (six) hours as needed for muscle spasms.     traMADol 50 MG tablet  Commonly known as:  ULTRAM  Take 1-2 tablets (50-100 mg total) by mouth every 6 (six) hours as needed for moderate pain.         Total Time in preparing paper work, data evaluation and todays exam - 35 minutes  Ladislao Cohenour M.D on 04/20/2014 at 11:37 AM  [facility] Office  [phone]

## 2014-04-20 NOTE — Progress Notes (Signed)
Physical Therapy Treatment Patient Details Name: Victoria Sexton MRN: [id number] DOB: 03-14-1927 Today's Date: 04/20/2014    History of Present Illness Pt is an 81 year old female admitted after fall sustaining R femoral neck fx, s/p R hip hemiarthroplasty.    PT Comments    Pt and husband continue to decline SNF placement and plan is to go home with HHPT.  Issued posterior precaution hand out and reviewed with husband and pt.  Discussed use of gait belt and where he could purchase one.  They have 2 steps to enter home and husband stated he has "a man and a woman" coming to help get her in the house and that they are going to carry her up in the w/c.  Educated on safety on picking up w/c and to make sure they had frame and not pulling on removable arm rests.  Husband also educated on how to elevate surface height of chair in car for transfer to maintain hip precautions.    Follow Up Recommendations  SNF;Supervision/Assistance - 24 hour (Pt refusing and going home with HHPT)     Equipment Recommendations  None recommended by PT    Recommendations for Other Services       Precautions / Restrictions Precautions Precautions: Fall;Posterior Hip Precaution Booklet Issued: Yes (comment) Restrictions Weight Bearing Restrictions: Yes RLE Weight Bearing: Weight bearing as tolerated    Mobility  Bed Mobility Overal bed mobility: Needs Assistance Bed Mobility: Supine to Sit     Supine to sit: Min assist;HOB elevated     General bed mobility comments: max verbal cues for technique  Transfers Overall transfer level: Needs assistance Equipment used: Rolling walker (2 wheeled) Transfers: Sit to/from Stand Sit to Stand: Min assist;From elevated surface Stand pivot transfers: Min assist;From elevated surface       General transfer comment: MAX cueing for hand placement and technique to maintain hip preacautions.  Ambulation/Gait Ambulation/Gait assistance: Min assist;+2  safety/equipment Ambulation Distance (Feet): 18 Feet Assistive device: Rolling walker (2 wheeled) Gait Pattern/deviations: Step-to pattern;Antalgic Gait velocity: decreased   General Gait Details: Husband A with gait and educated on proper distance of RW placement, sequence, etc.   Stairs            Wheelchair Mobility    Modified Rankin (Stroke Patients Only)       Balance Overall balance assessment: History of Falls                                  Cognition Arousal/Alertness: Awake/alert Behavior During Therapy: WFL for tasks assessed/performed Overall Cognitive Status: History of cognitive impairments - at baseline       Memory: Decreased short-term memory;Decreased recall of precautions              Exercises      General Comments        Pertinent Vitals/Pain Pain Assessment: Faces Faces Pain Scale: Hurts even more Pain Location: R hip with movement Pain Descriptors / Indicators: Aching Pain Intervention(s): Limited activity within patient's tolerance;Monitored during session;Ice applied    Home Living                      Prior Function            PT Goals (current goals can now be found in the care plan section) Acute Rehab PT Goals Patient Stated Goal: home PT Goal Formulation: With patient/family  Time For Goal Achievement: 04/25/14 Potential to Achieve Goals: Good Progress towards PT goals: Progressing toward goals    Frequency  Min 6X/week    PT Plan Current plan remains appropriate    Co-evaluation   Reason for Co-Treatment: Complexity of the patient's impairments (multi-system involvement);For patient/therapist safety PT goals addressed during session: Mobility/safety with mobility;Proper use of DME OT goals addressed during session: ADL's and self-care;Proper use of Adaptive equipment and DME     End of Session Equipment Utilized During Treatment: Gait belt Activity Tolerance: Patient limited by  fatigue;Patient limited by pain Patient left: in chair;with chair alarm set;with family/visitor present;with call bell/phone within reach     Time: 1026-1108 PT Time Calculation (min) (ACUTE ONLY): 42 min  Charges:  $Gait Training: 8-22 mins $Therapeutic Activity: 8-22 mins                    G Codes:      Sonu Kruckenberg LUBECK 04/20/2014, 12:38 PM

## 2014-04-20 NOTE — Care Management Note (Signed)
    Page 1 of 2   04/20/2014     3:11:32 PM CARE MANAGEMENT NOTE 04/20/2014  Patient:  Victoria Sexton, Victoria Sexton   Account Number:  [id number]  Date Initiated:  04/19/2014  Documentation initiated by:  Karl Bales  Subjective/Objective Assessment:   pt admitted with fx     Action/Plan:   home with [facility]   Anticipated DC Date:  04/20/2014   Anticipated DC Plan:  [place]  CM consult      [facility] Choice  HOME HEALTH   Choice offered to / List presented to:  C-3 Spouse   DME arranged  [facility]      DME agency  [place] arranged  HH-2 PT  HH-1 RN  [place] agency  [facility]   Status of service:  Completed, signed off Medicare Important Message given?  YES (If response is "NO", the following Medicare IM given date fields will be blank) Date Medicare IM given:  04/19/2014 Medicare IM given by:  Karl Bales Date Additional Medicare IM given:   Additional Medicare IM given by:    Discharge Disposition:  [place]  Per UR Regulation:  Reviewed for med. necessity/level of care/duration of stay  If discussed at [place] of Stay Meetings, dates discussed:    Comments:  04/20/14 Dessa Phi RN BSN NCM [place] aware of [place] orders, & d/c.[facility] dme rep Lecret[place] aware of 3n1.No further d/c needs.  04/19/14 MMcGibboney, RN, BSN Chart reviewed.

## 2014-04-20 NOTE — Progress Notes (Signed)
Occupational Therapy Treatment Patient Details Name: Victoria Sexton MRN: [id number] DOB: 08-Jun-1927 Today's Date: 04/20/2014    History of present illness Pt is an 81 year old female admitted after fall sustaining R femoral neck fx, s/p R hip hemiarthroplasty.   OT comments  Patient and patient's husband state they plan to go home today. Patient did somewhat better with mobility and transfers but needs 24/7 A and constant cueing to get through all basic mobility and ADLs. Husband educated on THPs and transfer technique.   Follow Up Recommendations  Other (OT recommendation is still SNF. However, patient and patient's husband insist on going home. If pt goes home, recommend [place] [facility] [place] aide at discharge).    Equipment Recommendations  3 in 1 bedside comode    Recommendations for Other Services      Precautions / Restrictions Precautions Precautions: Fall;Posterior Hip Restrictions Weight Bearing Restrictions: Yes RLE Weight Bearing: Weight bearing as tolerated       Mobility Bed Mobility Overal bed mobility: Needs Assistance Bed Mobility: Supine to Sit     Supine to sit: Min assist;HOB elevated (increased time)     General bed mobility comments: max verbal cues for technique  Transfers Overall transfer level: Needs assistance Equipment used: Rolling walker (2 wheeled) Transfers: Sit to/from Stand Sit to Stand: Min assist;From elevated surface Stand pivot transfers: Min assist;From elevated surface       General transfer comment: max cues for precautions, technique, and safety.    Balance                                   ADL Overall ADL's : Needs assistance/impaired Eating/Feeding: Set up               Upper Body Dressing : Maximal assistance;Sitting (don gown as robe)   Lower Body Dressing: Total assistance;Sit to/from stand   Toilet Transfer: Minimal assistance;+2 for safety/equipment;Cueing for safety;Cueing for  sequencing;Stand-pivot;BSC;RW Toilet Transfer Details (indicate cue type and reason): max cues for technique and safety Toileting- Clothing Manipulation and Hygiene: Moderate assistance;Sit to/from stand Toileting - Clothing Manipulation Details (indicate cue type and reason): Performed toilet hygiene from seated position supervision. Max A clothing management of gown.     Functional mobility during ADLs: Minimal assistance;Rolling walker General ADL Comments: Reviewed THPs with patient and husband via handout. Patient with significant cognitive impairments during today's session. Does not recall THPs, ever working with therapy, and getting confused with cues. She kept asking, "Which is my left leg? Which is my right leg? I don't know my left from my right." Husband practiced transfer off of BSC using gait belt. Patient and patient's husband state they will be going home today. Called case manager to obtain 3 in 1 and orders for HHPT/HHOT/HH aide. They plan on having 2 other adults bump patient up their 5 STE in wheelchair. Then patient will be in a recliner, including for sleep, on the 1st floor of their home.       Vision                     Perception     Praxis      Cognition   Behavior During Therapy: [facility] for tasks assessed/performed Overall Cognitive Status: History of cognitive impairments - at baseline       Memory: Decreased recall of precautions;Decreased short-term memory  Extremity/Trunk Assessment               Exercises     Shoulder Instructions       General Comments      Pertinent Vitals/ Pain       Pain Assessment: Faces Faces Pain Scale: Hurts even more Pain Location: R hip with movement Pain Descriptors / Indicators: Aching;Sore Pain Intervention(s): Limited activity within patient's tolerance;Monitored during session  Home Living                                          Prior Functioning/Environment               Frequency Min 2X/week     Progress Toward Goals  OT Goals(current goals can now be found in the care plan section)  Progress towards OT goals: Progressing toward goals     Plan Discharge plan needs to be updated    Co-evaluation    PT/OT/SLP Co-Evaluation/Treatment: Yes Reason for Co-Treatment: For patient/therapist safety;Complexity of the patient's impairments (multi-system involvement) PT goals addressed during session: Mobility/safety with mobility;Proper use of DME OT goals addressed during session: ADL's and self-care;Proper use of Adaptive equipment and DME      End of Session Equipment Utilized During Treatment: Gait belt;Rolling walker   Activity Tolerance Patient limited by fatigue;Patient limited by pain   Patient Left in chair;with call bell/phone within reach;with family/visitor present   Nurse Communication Mobility status;Other (comment) (successful urination in [facility])        Time: 6010-9323 OT Time Calculation (min): 42 min  Charges: OT General Charges $OT Visit: 1 Procedure OT Treatments $Self Care/Home Management : 8-22 mins  Jaris Kohles A 04/20/2014, 11:52 AM

## 2014-04-20 NOTE — Progress Notes (Signed)
     Primary cardiologist: Dr. Vanice Sarah  Seen for followup: Cardiac follow-up after hip surgery  Subjective:    Eating breakfast, no chest pain or palpitations. Plans to go home today.  Objective:   Temp:  [98.2 F (36.8 C)-99 F (37.2 C)] 98.6 F (37 C) (04/16 0520) Pulse Rate:  [78-91] 78 (04/16 0520) Resp:  [15-18] 16 (04/16 0520) BP: (102-133)/(57-73) 129/57 mmHg (04/16 0520) SpO2:  [95 %-97 %] 96 % (04/16 0520) Weight:  [128 lb 8.5 oz (58.3 kg)] 128 lb 8.5 oz (58.3 kg) (04/16 0520) Last BM Date: 04/16/14  Filed Weights   04/16/14 2238 04/19/14 0448 04/20/14 0520  Weight: 98 lb 12.3 oz (44.8 kg) 124 lb 1.9 oz (56.3 kg) 128 lb 8.5 oz (58.3 kg)    Intake/Output Summary (Last 24 hours) at 04/20/14 0938 Last data filed at 04/20/14 0850  Gross per 24 hour  Intake    720 ml  Output   1200 ml  Net   -480 ml    Telemetry: Sinus rhythm.  Exam:  General: Elderly woman in no distress.  Lungs: Clear, nonlabored.  Cardiac: RRR with 2/6 apical systolic murmur.  Abdomen: NABS.  Extremities: No edema.  Lab Results:  Basic Metabolic Panel:  Recent Labs Lab 04/18/14 0439 04/19/14 0420 04/20/14 0515  NA 135 136 136  K 3.3* 5.1 4.1  CL 105 104 105  CO2 23 26 26   GLUCOSE 153* 104* 102*  BUN 11 17 14   CREATININE 0.51 0.45* 0.49*  CALCIUM 8.2* 8.9 8.2*  MG  --  2.1  --     CBC:  Recent Labs Lab 04/17/14 0455 04/18/14 0439 04/19/14 0420  WBC 14.0* 8.4 9.6  HGB 11.1* 10.0* 9.4*  HCT 35.4* 31.0* 29.8*  MCV 82.5 82.0 83.2  PLT 182 122* 144*    Coagulation:  Recent Labs Lab 04/16/14 1920  INR 1.00    ECG: Sinus rhythm with increased voltage, left atrial enlargement, decreased R wave progression.   Medications:   Scheduled Medications: . aspirin EC  325 mg Oral BID  . docusate sodium  100 mg Oral BID  . feeding supplement (ENSURE ENLIVE)  237 mL Oral BID BM  . ferrous sulfate  325 mg Oral TID PC  . lisinopril  5 mg Oral BID  . metoprolol  tartrate  25 mg Oral BID  . senna-docusate  2 tablet Oral BID  . simvastatin  5 mg Oral Daily      PRN Medications:  alum & mag hydroxide-simeth, bisacodyl, fentaNYL (SUBLIMAZE) injection, HYDROmorphone (DILAUDID) injection, menthol-cetylpyridinium **OR** phenol, methocarbamol **OR** methocarbamol (ROBAXIN)  IV, metoCLOPramide **OR** metoCLOPramide (REGLAN) injection, ondansetron **OR** ondansetron (ZOFRAN) IV, oxyCODONE, polyethylene glycol, traMADol   Assessment:   1. Status post right hip hemiarthroplasty following hip fracture per Dr. Alvan Dame on April 13.  2. Ischemic heart disease status post previous CABG 2013, clinically stable without angina on medical therapy.  3. History of mitral valve repair and angioplasty 2013. Last echocardiogram 2014 was no residual mitral regurgitation and normal LVEF. She does have an apical systolic murmur and can follow-up with an echocardiogram and an outpatient.  Plan/Discussion:    Patient states she is ready for discharge today. Stable from a cardiac perspective, would continue current regimen and keep scheduled follow-up with Dr. Marlou Porch. She can have a follow-up echocardiogram as an outpatient, this does not need to be done as an inpatient or delay her discharge.   Satira Sark, M.D., F.A.C.C.

## 2014-04-23 DIAGNOSIS — I10 Essential (primary) hypertension: Secondary | ICD-10-CM | POA: Diagnosis not present

## 2014-04-23 DIAGNOSIS — M8580 Other specified disorders of bone density and structure, unspecified site: Secondary | ICD-10-CM | POA: Diagnosis not present

## 2014-04-23 DIAGNOSIS — Z471 Aftercare following joint replacement surgery: Secondary | ICD-10-CM | POA: Diagnosis not present

## 2014-04-23 DIAGNOSIS — I5032 Chronic diastolic (congestive) heart failure: Secondary | ICD-10-CM | POA: Diagnosis not present

## 2014-04-23 DIAGNOSIS — E441 Mild protein-calorie malnutrition: Secondary | ICD-10-CM | POA: Diagnosis not present

## 2014-04-23 DIAGNOSIS — I2510 Atherosclerotic heart disease of native coronary artery without angina pectoris: Secondary | ICD-10-CM | POA: Diagnosis not present

## 2014-04-24 DIAGNOSIS — E441 Mild protein-calorie malnutrition: Secondary | ICD-10-CM | POA: Diagnosis not present

## 2014-04-24 DIAGNOSIS — Z471 Aftercare following joint replacement surgery: Secondary | ICD-10-CM | POA: Diagnosis not present

## 2014-04-24 DIAGNOSIS — I5032 Chronic diastolic (congestive) heart failure: Secondary | ICD-10-CM | POA: Diagnosis not present

## 2014-04-24 DIAGNOSIS — I10 Essential (primary) hypertension: Secondary | ICD-10-CM | POA: Diagnosis not present

## 2014-04-24 DIAGNOSIS — M8580 Other specified disorders of bone density and structure, unspecified site: Secondary | ICD-10-CM | POA: Diagnosis not present

## 2014-04-24 DIAGNOSIS — I2510 Atherosclerotic heart disease of native coronary artery without angina pectoris: Secondary | ICD-10-CM | POA: Diagnosis not present

## 2014-04-26 DIAGNOSIS — M8580 Other specified disorders of bone density and structure, unspecified site: Secondary | ICD-10-CM | POA: Diagnosis not present

## 2014-04-26 DIAGNOSIS — I2510 Atherosclerotic heart disease of native coronary artery without angina pectoris: Secondary | ICD-10-CM | POA: Diagnosis not present

## 2014-04-26 DIAGNOSIS — E441 Mild protein-calorie malnutrition: Secondary | ICD-10-CM | POA: Diagnosis not present

## 2014-04-26 DIAGNOSIS — I10 Essential (primary) hypertension: Secondary | ICD-10-CM | POA: Diagnosis not present

## 2014-04-26 DIAGNOSIS — Z471 Aftercare following joint replacement surgery: Secondary | ICD-10-CM | POA: Diagnosis not present

## 2014-04-26 DIAGNOSIS — I5032 Chronic diastolic (congestive) heart failure: Secondary | ICD-10-CM | POA: Diagnosis not present

## 2014-04-29 DIAGNOSIS — I2510 Atherosclerotic heart disease of native coronary artery without angina pectoris: Secondary | ICD-10-CM | POA: Diagnosis not present

## 2014-04-29 DIAGNOSIS — I10 Essential (primary) hypertension: Secondary | ICD-10-CM | POA: Diagnosis not present

## 2014-04-29 DIAGNOSIS — M8580 Other specified disorders of bone density and structure, unspecified site: Secondary | ICD-10-CM | POA: Diagnosis not present

## 2014-04-29 DIAGNOSIS — E441 Mild protein-calorie malnutrition: Secondary | ICD-10-CM | POA: Diagnosis not present

## 2014-04-29 DIAGNOSIS — I5032 Chronic diastolic (congestive) heart failure: Secondary | ICD-10-CM | POA: Diagnosis not present

## 2014-04-29 DIAGNOSIS — Z471 Aftercare following joint replacement surgery: Secondary | ICD-10-CM | POA: Diagnosis not present

## 2014-04-30 DIAGNOSIS — Z471 Aftercare following joint replacement surgery: Secondary | ICD-10-CM | POA: Diagnosis not present

## 2014-04-30 DIAGNOSIS — Z8781 Personal history of (healed) traumatic fracture: Secondary | ICD-10-CM | POA: Diagnosis not present

## 2014-04-30 DIAGNOSIS — D649 Anemia, unspecified: Secondary | ICD-10-CM | POA: Diagnosis not present

## 2014-04-30 DIAGNOSIS — Z96641 Presence of right artificial hip joint: Secondary | ICD-10-CM | POA: Diagnosis not present

## 2014-04-30 DIAGNOSIS — R413 Other amnesia: Secondary | ICD-10-CM | POA: Diagnosis not present

## 2014-04-30 DIAGNOSIS — I509 Heart failure, unspecified: Secondary | ICD-10-CM | POA: Diagnosis not present

## 2014-04-30 DIAGNOSIS — K5900 Constipation, unspecified: Secondary | ICD-10-CM | POA: Diagnosis not present

## 2014-05-01 DIAGNOSIS — M8580 Other specified disorders of bone density and structure, unspecified site: Secondary | ICD-10-CM | POA: Diagnosis not present

## 2014-05-01 DIAGNOSIS — I10 Essential (primary) hypertension: Secondary | ICD-10-CM | POA: Diagnosis not present

## 2014-05-01 DIAGNOSIS — I2510 Atherosclerotic heart disease of native coronary artery without angina pectoris: Secondary | ICD-10-CM | POA: Diagnosis not present

## 2014-05-01 DIAGNOSIS — Z471 Aftercare following joint replacement surgery: Secondary | ICD-10-CM | POA: Diagnosis not present

## 2014-05-01 DIAGNOSIS — I5032 Chronic diastolic (congestive) heart failure: Secondary | ICD-10-CM | POA: Diagnosis not present

## 2014-05-01 DIAGNOSIS — E441 Mild protein-calorie malnutrition: Secondary | ICD-10-CM | POA: Diagnosis not present

## 2014-05-03 DIAGNOSIS — I5032 Chronic diastolic (congestive) heart failure: Secondary | ICD-10-CM | POA: Diagnosis not present

## 2014-05-03 DIAGNOSIS — I10 Essential (primary) hypertension: Secondary | ICD-10-CM | POA: Diagnosis not present

## 2014-05-03 DIAGNOSIS — Z471 Aftercare following joint replacement surgery: Secondary | ICD-10-CM | POA: Diagnosis not present

## 2014-05-03 DIAGNOSIS — E441 Mild protein-calorie malnutrition: Secondary | ICD-10-CM | POA: Diagnosis not present

## 2014-05-03 DIAGNOSIS — M8580 Other specified disorders of bone density and structure, unspecified site: Secondary | ICD-10-CM | POA: Diagnosis not present

## 2014-05-03 DIAGNOSIS — I2510 Atherosclerotic heart disease of native coronary artery without angina pectoris: Secondary | ICD-10-CM | POA: Diagnosis not present

## 2014-05-06 DIAGNOSIS — Z471 Aftercare following joint replacement surgery: Secondary | ICD-10-CM | POA: Diagnosis not present

## 2014-05-06 DIAGNOSIS — E441 Mild protein-calorie malnutrition: Secondary | ICD-10-CM | POA: Diagnosis not present

## 2014-05-06 DIAGNOSIS — I10 Essential (primary) hypertension: Secondary | ICD-10-CM | POA: Diagnosis not present

## 2014-05-06 DIAGNOSIS — I2510 Atherosclerotic heart disease of native coronary artery without angina pectoris: Secondary | ICD-10-CM | POA: Diagnosis not present

## 2014-05-06 DIAGNOSIS — I5032 Chronic diastolic (congestive) heart failure: Secondary | ICD-10-CM | POA: Diagnosis not present

## 2014-05-06 DIAGNOSIS — M8580 Other specified disorders of bone density and structure, unspecified site: Secondary | ICD-10-CM | POA: Diagnosis not present

## 2014-05-09 DIAGNOSIS — I10 Essential (primary) hypertension: Secondary | ICD-10-CM | POA: Diagnosis not present

## 2014-05-09 DIAGNOSIS — I2510 Atherosclerotic heart disease of native coronary artery without angina pectoris: Secondary | ICD-10-CM | POA: Diagnosis not present

## 2014-05-09 DIAGNOSIS — Z471 Aftercare following joint replacement surgery: Secondary | ICD-10-CM | POA: Diagnosis not present

## 2014-05-09 DIAGNOSIS — M8580 Other specified disorders of bone density and structure, unspecified site: Secondary | ICD-10-CM | POA: Diagnosis not present

## 2014-05-09 DIAGNOSIS — I5032 Chronic diastolic (congestive) heart failure: Secondary | ICD-10-CM | POA: Diagnosis not present

## 2014-05-09 DIAGNOSIS — E441 Mild protein-calorie malnutrition: Secondary | ICD-10-CM | POA: Diagnosis not present

## 2014-05-13 DIAGNOSIS — Z471 Aftercare following joint replacement surgery: Secondary | ICD-10-CM | POA: Diagnosis not present

## 2014-05-13 DIAGNOSIS — I5032 Chronic diastolic (congestive) heart failure: Secondary | ICD-10-CM | POA: Diagnosis not present

## 2014-05-13 DIAGNOSIS — E441 Mild protein-calorie malnutrition: Secondary | ICD-10-CM | POA: Diagnosis not present

## 2014-05-13 DIAGNOSIS — M8580 Other specified disorders of bone density and structure, unspecified site: Secondary | ICD-10-CM | POA: Diagnosis not present

## 2014-05-13 DIAGNOSIS — I2510 Atherosclerotic heart disease of native coronary artery without angina pectoris: Secondary | ICD-10-CM | POA: Diagnosis not present

## 2014-05-13 DIAGNOSIS — I10 Essential (primary) hypertension: Secondary | ICD-10-CM | POA: Diagnosis not present

## 2014-05-16 DIAGNOSIS — I2510 Atherosclerotic heart disease of native coronary artery without angina pectoris: Secondary | ICD-10-CM | POA: Diagnosis not present

## 2014-05-16 DIAGNOSIS — I10 Essential (primary) hypertension: Secondary | ICD-10-CM | POA: Diagnosis not present

## 2014-05-16 DIAGNOSIS — M8580 Other specified disorders of bone density and structure, unspecified site: Secondary | ICD-10-CM | POA: Diagnosis not present

## 2014-05-16 DIAGNOSIS — E441 Mild protein-calorie malnutrition: Secondary | ICD-10-CM | POA: Diagnosis not present

## 2014-05-16 DIAGNOSIS — I5032 Chronic diastolic (congestive) heart failure: Secondary | ICD-10-CM | POA: Diagnosis not present

## 2014-05-16 DIAGNOSIS — Z471 Aftercare following joint replacement surgery: Secondary | ICD-10-CM | POA: Diagnosis not present

## 2014-05-20 DIAGNOSIS — I5032 Chronic diastolic (congestive) heart failure: Secondary | ICD-10-CM | POA: Diagnosis not present

## 2014-05-20 DIAGNOSIS — M8580 Other specified disorders of bone density and structure, unspecified site: Secondary | ICD-10-CM | POA: Diagnosis not present

## 2014-05-20 DIAGNOSIS — I10 Essential (primary) hypertension: Secondary | ICD-10-CM | POA: Diagnosis not present

## 2014-05-20 DIAGNOSIS — Z471 Aftercare following joint replacement surgery: Secondary | ICD-10-CM | POA: Diagnosis not present

## 2014-05-20 DIAGNOSIS — E441 Mild protein-calorie malnutrition: Secondary | ICD-10-CM | POA: Diagnosis not present

## 2014-05-20 DIAGNOSIS — I2510 Atherosclerotic heart disease of native coronary artery without angina pectoris: Secondary | ICD-10-CM | POA: Diagnosis not present

## 2014-05-23 DIAGNOSIS — E441 Mild protein-calorie malnutrition: Secondary | ICD-10-CM | POA: Diagnosis not present

## 2014-05-23 DIAGNOSIS — Z471 Aftercare following joint replacement surgery: Secondary | ICD-10-CM | POA: Diagnosis not present

## 2014-05-23 DIAGNOSIS — I2510 Atherosclerotic heart disease of native coronary artery without angina pectoris: Secondary | ICD-10-CM | POA: Diagnosis not present

## 2014-05-23 DIAGNOSIS — I10 Essential (primary) hypertension: Secondary | ICD-10-CM | POA: Diagnosis not present

## 2014-05-23 DIAGNOSIS — M8580 Other specified disorders of bone density and structure, unspecified site: Secondary | ICD-10-CM | POA: Diagnosis not present

## 2014-05-23 DIAGNOSIS — I5032 Chronic diastolic (congestive) heart failure: Secondary | ICD-10-CM | POA: Diagnosis not present

## 2014-05-29 DIAGNOSIS — M8580 Other specified disorders of bone density and structure, unspecified site: Secondary | ICD-10-CM | POA: Diagnosis not present

## 2014-05-29 DIAGNOSIS — I10 Essential (primary) hypertension: Secondary | ICD-10-CM | POA: Diagnosis not present

## 2014-05-29 DIAGNOSIS — I2510 Atherosclerotic heart disease of native coronary artery without angina pectoris: Secondary | ICD-10-CM | POA: Diagnosis not present

## 2014-05-29 DIAGNOSIS — E441 Mild protein-calorie malnutrition: Secondary | ICD-10-CM | POA: Diagnosis not present

## 2014-05-29 DIAGNOSIS — I5032 Chronic diastolic (congestive) heart failure: Secondary | ICD-10-CM | POA: Diagnosis not present

## 2014-05-29 DIAGNOSIS — Z471 Aftercare following joint replacement surgery: Secondary | ICD-10-CM | POA: Diagnosis not present

## 2014-05-30 DIAGNOSIS — Z96641 Presence of right artificial hip joint: Secondary | ICD-10-CM | POA: Diagnosis not present

## 2014-05-30 DIAGNOSIS — Z471 Aftercare following joint replacement surgery: Secondary | ICD-10-CM | POA: Diagnosis not present

## 2014-06-05 DIAGNOSIS — E441 Mild protein-calorie malnutrition: Secondary | ICD-10-CM | POA: Diagnosis not present

## 2014-06-05 DIAGNOSIS — I10 Essential (primary) hypertension: Secondary | ICD-10-CM | POA: Diagnosis not present

## 2014-06-05 DIAGNOSIS — Z471 Aftercare following joint replacement surgery: Secondary | ICD-10-CM | POA: Diagnosis not present

## 2014-06-05 DIAGNOSIS — I2510 Atherosclerotic heart disease of native coronary artery without angina pectoris: Secondary | ICD-10-CM | POA: Diagnosis not present

## 2014-06-05 DIAGNOSIS — I5032 Chronic diastolic (congestive) heart failure: Secondary | ICD-10-CM | POA: Diagnosis not present

## 2014-06-05 DIAGNOSIS — M8580 Other specified disorders of bone density and structure, unspecified site: Secondary | ICD-10-CM | POA: Diagnosis not present

## 2014-06-26 ENCOUNTER — Other Ambulatory Visit: Payer: Self-pay | Admitting: Cardiology

## 2014-07-03 DIAGNOSIS — I10 Essential (primary) hypertension: Secondary | ICD-10-CM | POA: Diagnosis not present

## 2014-07-03 DIAGNOSIS — R634 Abnormal weight loss: Secondary | ICD-10-CM | POA: Diagnosis not present

## 2014-07-03 DIAGNOSIS — E46 Unspecified protein-calorie malnutrition: Secondary | ICD-10-CM | POA: Diagnosis not present

## 2014-07-03 DIAGNOSIS — R413 Other amnesia: Secondary | ICD-10-CM | POA: Diagnosis not present

## 2014-07-03 DIAGNOSIS — Z85828 Personal history of other malignant neoplasm of skin: Secondary | ICD-10-CM | POA: Diagnosis not present

## 2014-07-03 DIAGNOSIS — D225 Melanocytic nevi of trunk: Secondary | ICD-10-CM | POA: Diagnosis not present

## 2014-07-03 DIAGNOSIS — L821 Other seborrheic keratosis: Secondary | ICD-10-CM | POA: Diagnosis not present

## 2014-07-03 DIAGNOSIS — D649 Anemia, unspecified: Secondary | ICD-10-CM | POA: Diagnosis not present

## 2014-07-11 ENCOUNTER — Other Ambulatory Visit: Payer: Self-pay | Admitting: Family Medicine

## 2014-07-11 DIAGNOSIS — R413 Other amnesia: Secondary | ICD-10-CM

## 2014-07-18 ENCOUNTER — Ambulatory Visit
Admission: RE | Admit: 2014-07-18 | Discharge: 2014-07-18 | Disposition: A | Discharge status: Home / Self Care | Payer: Medicare Other | Source: Ambulatory Visit | Attending: Family Medicine | Admitting: Family Medicine

## 2014-07-18 DIAGNOSIS — R413 Other amnesia: Secondary | ICD-10-CM | POA: Diagnosis not present

## 2014-08-07 DIAGNOSIS — I10 Essential (primary) hypertension: Secondary | ICD-10-CM | POA: Diagnosis not present

## 2014-08-07 DIAGNOSIS — E46 Unspecified protein-calorie malnutrition: Secondary | ICD-10-CM | POA: Diagnosis not present

## 2014-08-07 DIAGNOSIS — F0390 Unspecified dementia without behavioral disturbance: Secondary | ICD-10-CM | POA: Diagnosis not present

## 2014-08-20 ENCOUNTER — Ambulatory Visit (INDEPENDENT_AMBULATORY_CARE_PROVIDER_SITE_OTHER): Payer: Medicare Other | Admitting: Diagnostic Neuroimaging

## 2014-08-20 ENCOUNTER — Encounter: Payer: Self-pay | Admitting: Diagnostic Neuroimaging

## 2014-08-20 VITALS — BP 163/76 | HR 68 | Ht 65.0 in | Wt 95.6 lb

## 2014-08-20 DIAGNOSIS — F03A0 Unspecified dementia, mild, without behavioral disturbance, psychotic disturbance, mood disturbance, and anxiety: Secondary | ICD-10-CM | POA: Insufficient documentation

## 2014-08-20 DIAGNOSIS — F0390 Unspecified dementia without behavioral disturbance: Secondary | ICD-10-CM | POA: Diagnosis not present

## 2014-08-20 DIAGNOSIS — I2510 Atherosclerotic heart disease of native coronary artery without angina pectoris: Secondary | ICD-10-CM

## 2014-08-20 NOTE — Progress Notes (Signed)
[facility]  PATIENT: Victoria Sexton DOB: April 25, 1927  REFERRING CLINICIAN: C White  HISTORY FROM: patient and husband  REASON FOR VISIT: new consult    HISTORICAL  CHIEF COMPLAINT:  Chief Complaint  Patient presents with  . Memory Loss    rm 7, husband Ed,  MMSE 19    HISTORY OF PRESENT ILLNESS:   81 year old female with hypertension, here for evaluation of memory loss and dementia.   I asked patient why she came to see me and she was not sure. She turned to look at her husband for help in answering my questions. When I asked her about memory problems, she says that she has some they are not significant. She still takes care of house chores, laundry, cleaning up, cooking as well as her own personal hygiene, dressing and bathing issues. She no longer drives, due to vision changes from one year ago.   I asked patient's husband to review her history of memory problems. He started back from April 2016. Patient was at a church blood [place], when apparently she fell down and broke her right hip. She's not sure if she tripped but review of hospital records indicate multiple notes mentioning that she tripped or stumbled on a chair causing her to fall. Husband thinks she may have had a stroke although this was not confirmed either by history or MRI scanning. Patient went to the hospital for evaluation and surgical repair. Following surgery patient had significant postop delirium and confusion. Patient was having hallucinations, paranoia, agitation. After one month symptoms gradually resolved. Patient followed up with PCP was diagnosed with possible dementia. She was started on Exelon patch which seemed to help her symptoms. After another month symptoms deteriorated and therefore Exelon patch dose was increased. Patient's husband states this has helped her memory problems and day-to-day activities.  REVIEW OF SYSTEMS: Full 14 system review of systems performed and notable only for  memory loss decreased energy.   ALLERGIES: Allergies  Allergen Reactions  . Hydrocodone-Acetaminophen Anxiety    Pt nervous and shaky per pt husband  . Penicillins Palpitations  . Cyclobenzaprine Other (See Comments)    Dry mouth  . Sulfa Antibiotics Rash    HOME MEDICATIONS: Outpatient Prescriptions Prior to Visit  Medication Sig Dispense Refill  . Coenzyme Q10 (CO Q-10) 50 MG CAPS Take 50 mg by mouth daily.    [name] ENSURE (ENSURE) Take 237 mLs by mouth 3 (three) times daily between meals. 237 mL 12  . lisinopril (PRINIVIL,ZESTRIL) 5 MG tablet TAKE 1 TABLET ONCE DAILY. 90 tablet 0  . metoprolol succinate (TOPROL-XL) 50 MG 24 hr tablet Take 1 tablet (50 mg total) by mouth 2 (two) times daily. Take with or immediately following a meal. 60 tablet 1  . multivitamin-lutein (OCUVITE-LUTEIN) CAPS Take 1 capsule by mouth daily. 6MG     . OVER THE COUNTER MEDICATION Take 1 tablet by mouth daily. Omega Red    . simvastatin (ZOCOR) 5 MG tablet TAKE 1 TABLET ONCE DAILY. 90 tablet 3  . acetaminophen (TYLENOL) 325 MG tablet Take 1-2 tablets (325-650 mg total) by mouth every 6 (six) hours as needed.    [name] amoxicillin (AMOXIL) 500 MG capsule TAKE 4 CAPSULES ONE HOUR PRIOR TO DENTAL PROCEDURE. 4 capsule 1  . docusate sodium (COLACE) 100 MG capsule Take 1 capsule (100 mg total) by mouth 2 (two) times daily. 10 capsule 0  . ferrous sulfate 325 (65 FE) MG tablet Take 1 tablet (325 mg total) by  mouth 3 (three) times daily after meals.  3  . polyethylene glycol (MIRALAX / GLYCOLAX) packet Take 17 g by mouth daily as needed for mild constipation. 14 each 0  . tiZANidine (ZANAFLEX) 4 MG tablet Take 1 tablet (4 mg total) by mouth every 6 (six) hours as needed for muscle spasms. 40 tablet 0  . traMADol (ULTRAM) 50 MG tablet Take 1-2 tablets (50-100 mg total) by mouth every 6 (six) hours as needed for moderate pain. 80 tablet 0   No facility-administered medications prior to visit.    PAST MEDICAL HISTORY: Past  Medical History  Diagnosis Date  . Hypertension   . MVP (mitral valve prolapse)   . Osteoporosis   . Inguinal hernia right  . Mitral regurgitation due to cusp prolapse   . Cardiac tumor, ventricular     LVOT  . PVC (premature ventricular contraction)   . Hiatal hernia   . Coronary artery disease 10/04/2011  . Congestive heart failure 10/04/2011  . Heart murmur   . Shortness of breath   . Anginal pain   . Hernia, hiatal     Left wears a hernia belt  . Skin cancer 2013    Face.  . S/P mitral valve repair 10/12/2011    Complex valvuloplasty including triangular resection of flail posterior leaflet with artificial Goretex neocord placement x2 and 26 mm Sorin Memo 3D ring annuloplasty  . S/P CABG x 3 10/12/2011    LIMA to LAD, SVG to OM, SVG to RCA, EVH via left thigh and leg  . Thoracic compression fracture   . Hx of CABG     -LIMA to LAD,SVG to RCA, SVG to OM, mitral valve repair, patent foramen ovale closure, benign-apearing mass removal.  . Aortic atherosclerosis 10/25/2012    Upper aortic atherosclerosis seen on CT scan 08/13/2002  . Mild malnutrition 10/25/2012    PAST SURGICAL HISTORY: Past Surgical History  Procedure Laterality Date  . Abdominal hysterectomy    . Leg fx repair      Right  . Hemorroidectomy    . Tee without cardioversion  11/13/2010    Procedure: TRANSESOPHAGEAL ECHOCARDIOGRAM (TEE);  Surgeon: Candee Furbish, MD;  Location: [facility] ENDOSCOPY;  Service: Cardiovascular;  Laterality: N/A;  . Skin cancer excision  2013    not melanoma  . Hemorrhoid surgery    . Mitral valve repair  10/12/2011    Procedure: MITRAL VALVE REPAIR (MVR);  Surgeon: Rexene Alberts, MD;  Location: [place];  Service: Open Heart Surgery;  Laterality: N/A;  MITRAL VALVE REPAIR  . Coronary artery bypass graft  10/12/2011    Procedure: CORONARY ARTERY BYPASS GRAFTING (CABG);  Surgeon: Rexene Alberts, MD;  Location: [place];  Service: Open Heart Surgery;  Laterality: N/A;  RESECTION OF LV MASS  . Patent  foramen ovale closure  10/12/2011    Procedure: PATENT FORAMEN OVALE CLOSURE;  Surgeon: Rexene Alberts, MD;  Location: [place];  Service: Open Heart Surgery;  Laterality: N/A;  . Left and right heart catheterization with coronary angiogram N/A 09/29/2011    Procedure: LEFT AND RIGHT HEART CATHETERIZATION WITH CORONARY ANGIOGRAM;  Surgeon: Candee Furbish, MD;  [facility];  Service: Cardiovascular;  Laterality: N/A;  . Hip arthroplasty Right 04/17/2014    Procedure: ARTHROPLASTY BIPOLAR HIP;  Surgeon: Paralee Cancel, MD;  Location: [facility];  Service: Orthopedics;  Laterality: Right;    FAMILY HISTORY: Family History  Problem Relation Age of Onset  . Heart attack Mother   .  Lung cancer Father     SOCIAL HISTORY:  Social History   Social History  . Marital Status: Married    Spouse Name: Ed  . Number of Children: 0  . Years of Education: 12   Occupational History  . retired     Science writer   Social History Main Topics  . Smoking status: Former Smoker -- 0.50 packs/day for 30 years    Types: Cigarettes    Quit date: 11/13/2010  . Smokeless tobacco: Never Used  . Alcohol Use: No  . Drug Use: No  . Sexual Activity: Not on file     Comment: did not ask   Other Topics Concern  . Not on file   Social History Narrative   Lives at home with husband   Caffeine use - coffee 4-5 cups decaf daily     PHYSICAL EXAM  GENERAL EXAM/CONSTITUTIONAL: Vitals:  Filed Vitals:   08/20/14 1508  BP: 163/76  Pulse: 68  Height: 5\' 5"  (1.651 m)  Weight: 95 lb 9.6 oz (43.364 kg)     Body mass index is 15.91 kg/(m^2).  Visual Acuity Screening   Right eye Left eye Both eyes  Without correction:     With correction: 20/50 20/50      Patient is in no distress; well developed, nourished and groomed; neck is supple  CARDIOVASCULAR:  Examination of carotid arteries is normal; no carotid bruits  Regular rate and rhythm, no murmurs  Examination of peripheral vascular system by  observation and palpation is normal  EYES:  Ophthalmoscopic exam of optic discs and posterior segments is normal; no papilledema or hemorrhages  MUSCULOSKELETAL:  Gait, strength, tone, movements noted in Neurologic exam below  NEUROLOGIC: MENTAL STATUS:  MMSE - [place] Exam 08/20/2014  Orientation to time 2  Orientation to Place 2  Registration 3  Attention/ Calculation 4  Recall 0  Language- name 2 objects 2  Language- repeat 0  Language- follow 3 step command 3  Language- read & follow direction 1  Write a sentence 1  Copy design 1  Total score 19    awake, alert, oriented to person, place and timed; NOT YEAR, MONTH, [place].   recent and remote memory intact; 0/3 RECALL  normal attention and concentration; 4/5 RECALL  DECR FLUENCY; comprehension intact, naming intact,   fund of knowledge appropriate  PATIENT LOOKS TO HER HUSBAND FOR ANSWERS DURING INTERVIEW  CRANIAL NERVE:   2nd - no papilledema on fundoscopic exam  2nd, 3rd, 4th, 6th - pupils PINPOINT and reactive to light, visual fields full to confrontation, extraocular muscles intact, no nystagmus  5th - facial sensation symmetric  7th - facial strength symmetric  8th - hearing intact  9th - palate elevates symmetrically, uvula midline  11th - shoulder shrug symmetric  12th - tongue protrusion midline  MOTOR:   normal bulk and tone, full strength in the BUE, BLE  SENSORY:   normal and symmetric to light touch, temperature, vibration   COORDINATION:   finger-nose-finger, fine finger movements, heel-shin normal  REFLEXES:   deep tendon reflexes present and symmetric; POSITIVE SNOUT AND MYERSON; NEG PALMOMENTAL  GAIT/STATION:   narrow based gait; able to walk on toes, heels; KYPHOSIS OF UPPER EXT     DIAGNOSTIC DATA (LABS, IMAGING, TESTING) - I reviewed patient records, labs, notes, testing and imaging myself where available.  Lab Results  Component Value Date    WBC 9.6 04/19/2014   HGB 9.4* 04/19/2014  HCT 29.8* 04/19/2014   MCV 83.2 04/19/2014   PLT 144* 04/19/2014      Component Value Date/Time   NA 136 04/20/2014 0515   K 4.1 04/20/2014 0515   CL 105 04/20/2014 0515   CO2 26 04/20/2014 0515   GLUCOSE 102* 04/20/2014 0515   BUN 14 04/20/2014 0515   CREATININE 0.49* 04/20/2014 0515   CALCIUM 8.2* 04/20/2014 0515   PROT 7.6 12/29/2013 0041   ALBUMIN 4.2 12/29/2013 0041   AST 28 12/29/2013 0041   ALT 13 12/29/2013 0041   ALKPHOS 70 12/29/2013 0041   BILITOT 0.4 12/29/2013 0041   GFRNONAA 87* 04/20/2014 0515   GFRAA >90 04/20/2014 0515   Lab Results  Component Value Date   CHOL 172 11/16/2013   HDL 53.00 11/16/2013   LDLCALC 102* 11/16/2013   TRIG 83.0 11/16/2013   CHOLHDL 3 11/16/2013   Lab Results  Component Value Date   HGBA1C 5.9* 10/08/2011   No results found for: VITAMINB12 Lab Results  Component Value Date   TSH 1.475 09/28/2011    07/18/14 MRI brain [I reviewed images myself and agree with interpretation. Also there is moderate perisylivan and mesial temporal atrophy. -VRP]  - Mild to moderate for age chronic small vessel disease, and intracranial artery dolichoectasia.  - No acute intracranial abnormality.    ASSESSMENT AND PLAN  81 y.o. year old female here with sudden change in memory, confusion, language following fall and hip fracture in April 2016. Patient has had some improvement following Exelon treatment as well as Exelon dose increase. Neurologic examination notable for positive frontal release signs and MMSE testing of 19 out of 30.   DIAGNOSIS: mild-moderate neurodegenerative dementia, Alzheimer's type. Superimposed vascular dementia also possible given the sudden stepwise decline in April 2016.   PLAN: - Agree with Exelon patch therapy.  - In future we could consider adding memantine 10 mg twice a day.  - Otherwise the most important consideration for treatment is proper safety and supervision.  Fortunately patient has very good support from her husband as well as her church community.  Ddx: dementia (likely alzheimer's disease; superimposed vascular dementia also possible)  Return in about 6 months (around 02/20/2015).    Penni Bombard, MD 8/41/6606, 3:01 PM Certified in Neurology, Neurophysiology and Neuroimaging  [facility] [address] [place], [place] [address] [phone]

## 2014-08-21 ENCOUNTER — Encounter: Payer: Self-pay | Admitting: Cardiology

## 2014-08-21 ENCOUNTER — Ambulatory Visit (INDEPENDENT_AMBULATORY_CARE_PROVIDER_SITE_OTHER): Payer: Medicare Other | Admitting: Cardiology

## 2014-08-21 VITALS — BP 128/66 | HR 73 | Ht 62.0 in | Wt 95.8 lb

## 2014-08-21 DIAGNOSIS — Z9889 Other specified postprocedural states: Secondary | ICD-10-CM

## 2014-08-21 DIAGNOSIS — Z951 Presence of aortocoronary bypass graft: Secondary | ICD-10-CM

## 2014-08-21 DIAGNOSIS — E441 Mild protein-calorie malnutrition: Secondary | ICD-10-CM | POA: Diagnosis not present

## 2014-08-21 DIAGNOSIS — I2510 Atherosclerotic heart disease of native coronary artery without angina pectoris: Secondary | ICD-10-CM

## 2014-08-21 NOTE — Progress Notes (Signed)
[place]. [address] [place], [place]  [address] Phone: [phone] Fax:  [phone]  Date:  08/21/2014   ID:  Victoria Sexton, Victoria Sexton 1927/10/09, MRN [id number]  PCP:  Vidal Schwalbe, MD   History of Present Illness: Victoria Sexton is a 81 y.o. female female with coronary artery disease status post bypass surgery as well as mitral valve repair by Dr. Roxy Manns on 10/12/11 with LIMA to LAD, SVG to RCA, SVG to obtuse marginal, with patent foramen ovale closure and a benign-appearing endocardial mass removal here for followup.  She was recently hospitalized 12/29/13 with shortness of breath developing the evening, concerned about fluid on lungs like she did with her mitral valve repair. No chest pain. She does admit to eating and drinking significant more fluid and sodium. No edema. She was given IV diuretics and discharged on Lasix 20 mg a day. Echocardiogram was suggested. Low-sodium diet. IMA globin 10.1. Troponin was negative.  Upon follow-up at Dr. Noland Fordyce office, her Lasix and potassium was discontinued. She was having some orthostatic symptoms. Overall she's been feeling well without any further episodes of shortness of breath. Her husband, Ed, Delene Ruffini states that we would not celebrate the holidays because she has broken a bone, went to the hospital over Christmas etc.   She has had aortic atherosclerosis seen previously on CT scan and is currently being treated with statin medications. LDL in the past has been 91.  She has battled with mild malnutrition and is currently taking Ensure supplements. She has always been quite thin. Her husband is a retired Designer, multimedia.  Fell in 4/16 - memory loss, improved with Exelon. Drinking ensure.   Wt Readings from Last 3 Encounters:  08/21/14 95 lb 12.8 oz (43.455 kg)  08/20/14 95 lb 9.6 oz (43.364 kg)  04/20/14 128 lb 8.5 oz (58.3 kg)     Past Medical History  Diagnosis Date  . Hypertension   . MVP (mitral valve prolapse)   . Osteoporosis    . Inguinal hernia right  . Mitral regurgitation due to cusp prolapse   . Cardiac tumor, ventricular     LVOT  . PVC (premature ventricular contraction)   . Hiatal hernia   . Coronary artery disease 10/04/2011  . Congestive heart failure 10/04/2011  . Heart murmur   . Shortness of breath   . Anginal pain   . Hernia, hiatal     Left wears a hernia belt  . Skin cancer 2013    Face.  . S/P mitral valve repair 10/12/2011    Complex valvuloplasty including triangular resection of flail posterior leaflet with artificial Goretex neocord placement x2 and 26 mm Sorin Memo 3D ring annuloplasty  . S/P CABG x 3 10/12/2011    LIMA to LAD, SVG to OM, SVG to RCA, EVH via left thigh and leg  . Thoracic compression fracture   . Hx of CABG     -LIMA to LAD,SVG to RCA, SVG to OM, mitral valve repair, patent foramen ovale closure, benign-apearing mass removal.  . Aortic atherosclerosis 10/25/2012    Upper aortic atherosclerosis seen on CT scan 08/13/2002  . Mild malnutrition 10/25/2012    Past Surgical History  Procedure Laterality Date  . Abdominal hysterectomy    . Leg fx repair      Right  . Hemorroidectomy    . Tee without cardioversion  11/13/2010    Procedure: TRANSESOPHAGEAL ECHOCARDIOGRAM (TEE);  Surgeon: Candee Furbish, MD;  [facility] ENDOSCOPY;  Service: Cardiovascular;  Laterality: N/A;  . Skin cancer excision  2013    not melanoma  . Hemorrhoid surgery    . Mitral valve repair  10/12/2011    Procedure: MITRAL VALVE REPAIR (MVR);  Surgeon: Rexene Alberts, MD;  Location: [place];  Service: Open Heart Surgery;  Laterality: N/A;  MITRAL VALVE REPAIR  . Coronary artery bypass graft  10/12/2011    Procedure: CORONARY ARTERY BYPASS GRAFTING (CABG);  Surgeon: Rexene Alberts, MD;  Location: [place];  Service: Open Heart Surgery;  Laterality: N/A;  RESECTION OF LV MASS  . Patent foramen ovale closure  10/12/2011    Procedure: PATENT FORAMEN OVALE CLOSURE;  Surgeon: Rexene Alberts, MD;  Location: [place];  Service: Open Heart Surgery;  Laterality: N/A;  . Left and right heart catheterization with coronary angiogram N/A 09/29/2011    Procedure: LEFT AND RIGHT HEART CATHETERIZATION WITH CORONARY ANGIOGRAM;  Surgeon: Candee Furbish, MD;  Location[facility] C[facility] LAB;  Service: Cardiovascular;  Laterality: N/A;  . Hip arthroplasty Right 04/17/2014    Procedure: ARTHROPLASTY BIPOLAR HIP;  Surgeon: Paralee Cancel, MD;  Location: [facility];  Service: Orthopedics;  Laterality: Right;    Current Outpatient Prescriptions  Medication Sig Dispense Refill  . aspirin 81 MG tablet Take 81 mg by mouth daily.    . Cholecalciferol (VITAMIN D3) 2000 UNITS TABS Take 2,000 Units by mouth daily.     . Coenzyme Q10 (CO Q-10) 50 MG CAPS Take 50 mg by mouth daily.    [name] ENSURE (ENSURE) Take 1 Can by mouth 3 (three) times daily between meals.    [name] lisinopril (PRINIVIL,ZESTRIL) 5 MG tablet TAKE 1 TABLET ONCE DAILY. 90 tablet 0  . metoprolol succinate (TOPROL-XL) 50 MG 24 hr tablet Take 1 tablet (50 mg total) by mouth 2 (two) times daily. Take with or immediately following a meal. 60 tablet 1  . multivitamin-lutein (OCUVITE-LUTEIN) CAPS Take 1 capsule by mouth daily. 6MG     . OVER THE COUNTER MEDICATION Take 1 tablet by mouth daily. Omega Red    . rivastigmine (EXELON) 9.5 mg/24hr Place 9.5 mg onto the skin daily.    . simvastatin (ZOCOR) 5 MG tablet TAKE 1 TABLET ONCE DAILY. 90 tablet 3  . vitamin E 400 UNIT capsule Take 400 Units by mouth daily.     No current facility-administered medications for this visit.    Allergies:    Allergies  Allergen Reactions  . Hydrocodone-Acetaminophen Anxiety    Pt nervous and shaky per pt husband  . Penicillins Palpitations  . Cyclobenzaprine Other (See Comments)    Dry mouth  . Sulfa Antibiotics Rash    Social History:  The patient  reports that she quit smoking about 3 years ago. Her smoking use included Cigarettes. She has a 15 pack-year smoking history. She has never used smokeless  tobacco. She reports that she does not drink alcohol or use illicit drugs.   ROS:  Please see the history of present illness.   Denies any syncope, bleeding, orthopnea, PND   All other systems reviewed and negative.   PHYSICAL EXAM: VS:  BP 128/66 mmHg  Pulse 73  Ht 5\' 2"  (1.575 m)  Wt 95 lb 12.8 oz (43.455 kg)  BMI 17.52 kg/m2  SpO2 98% Thin, elderly, kyphotic, in no acute distress HEENT: normal Neck: no JVD Cardiac:  normal S1, S2; RRR; no murmur Lungs:  clear to auscultation bilaterally, no wheezing, rhonchi or rales Abd: soft, nontender, no hepatomegaly  Ext: no edema Skin: warm and dry Neuro: no focal abnormalities noted  EKG: 11/16/13 Sinus rhythm, 69 bpm, rightward axis, old septal infarct pattern.     ECHO: 10/18/11 (post repair of MV) - Left ventricle: The cavity size was normal. There was severe concentric hypertrophy. The estimated ejection fraction was 55%. Wall motion was normal; there were no regional wall motion abnormalities. There was an increased relative contribution of atrial contraction to ventricular filling. Doppler parameters are consistent with abnormal left ventricular relaxation (grade 1 diastolic dysfunction). - Aortic valve: Moderate thickening and calcification, consistent with sclerosis. - Mitral valve: By mean gradient there appears to be moderate mitral stenosis but MVA by pressure half-time appears normal. Prior procedures included surgical repair. An annular ring prosthesis was present. Moderate thickening, with moderate involvement of chords. - Left atrium: The atrium was mild to moderately dilated.  ASSESSMENT AND PLAN:  1. Orthostatic hypotension-no Lasix and potassium supplement. We can probably utilize this medication on an as-needed basis. If she begins to have shortness of breath again and increased weight, I told her that it would be reasonable to take one of her Lasix pills. She will call us if necessary. Continue with low-sodium  diet. Because she is not having any further shortness of breath. 2. Coronary artery disease-stable, no exertional anginal symptoms. Post bypass. Benign intracardiac mass removed during surgical procedure.  3. Mitral valve repair, doing well, stable 4. Hypertension-good control. Was a little bit high in the neurology office however today is normal. 5. Hyperlipidemia - stable, medications reviewed 6. Mild malnutrition-encourage ensure supplementation. BMI less than 20. 7. Aortic atherosclerosis-continue with secondary prevention, low-dose simvastatin. 8. Memory impairment-doing much better on Exelon. 9. We will see back in 6 months.  Signed, Candee Furbish, MD [facility]  08/21/2014 11:42 AM

## 2014-08-21 NOTE — Patient Instructions (Signed)
Medication Instructions:  Your physician recommends that you continue on your current medications as directed. Please refer to the Current Medication list given to you today.  Follow-Up: Follow up in 6 months with Dr. Skains.  You will receive a letter in the mail 2 months before you are due.  Please call us when you receive this letter to schedule your follow up appointment.  Thank you for choosing [facility]!!     

## 2014-09-17 ENCOUNTER — Other Ambulatory Visit: Payer: Self-pay | Admitting: Cardiology

## 2014-09-25 DIAGNOSIS — Z23 Encounter for immunization: Secondary | ICD-10-CM | POA: Diagnosis not present

## 2014-10-11 ENCOUNTER — Other Ambulatory Visit: Payer: Self-pay | Admitting: Cardiology

## 2014-10-29 DIAGNOSIS — R197 Diarrhea, unspecified: Secondary | ICD-10-CM | POA: Diagnosis not present

## 2014-10-29 DIAGNOSIS — I10 Essential (primary) hypertension: Secondary | ICD-10-CM | POA: Diagnosis not present

## 2014-10-29 DIAGNOSIS — R1110 Vomiting, unspecified: Secondary | ICD-10-CM | POA: Diagnosis not present

## 2014-11-29 ENCOUNTER — Other Ambulatory Visit: Payer: Self-pay | Admitting: Cardiology

## 2014-12-17 ENCOUNTER — Ambulatory Visit
Admission: RE | Admit: 2014-12-17 | Discharge: 2014-12-17 | Disposition: A | Discharge status: Home / Self Care | Payer: Medicare Other | Source: Ambulatory Visit | Attending: Family Medicine | Admitting: Family Medicine

## 2014-12-17 ENCOUNTER — Other Ambulatory Visit: Payer: Self-pay | Admitting: Family Medicine

## 2014-12-17 DIAGNOSIS — M810 Age-related osteoporosis without current pathological fracture: Secondary | ICD-10-CM | POA: Diagnosis not present

## 2014-12-17 DIAGNOSIS — M545 Low back pain: Secondary | ICD-10-CM

## 2014-12-17 DIAGNOSIS — M5135 Other intervertebral disc degeneration, thoracolumbar region: Secondary | ICD-10-CM | POA: Diagnosis not present

## 2014-12-19 ENCOUNTER — Emergency Department (HOSPITAL_COMMUNITY): Payer: Medicare Other

## 2014-12-19 ENCOUNTER — Encounter (HOSPITAL_COMMUNITY): Payer: Self-pay

## 2014-12-19 ENCOUNTER — Inpatient Hospital Stay (HOSPITAL_COMMUNITY)
Admission: EM | Admit: 2014-12-19 | Discharge: 2014-12-25 | DRG: 515 | Disposition: A | Discharge status: Home / Self Care | Payer: Medicare Other | Attending: Internal Medicine | Admitting: Internal Medicine

## 2014-12-19 DIAGNOSIS — Z79899 Other long term (current) drug therapy: Secondary | ICD-10-CM

## 2014-12-19 DIAGNOSIS — I2510 Atherosclerotic heart disease of native coronary artery without angina pectoris: Secondary | ICD-10-CM | POA: Diagnosis present

## 2014-12-19 DIAGNOSIS — I10 Essential (primary) hypertension: Secondary | ICD-10-CM | POA: Diagnosis not present

## 2014-12-19 DIAGNOSIS — Z90710 Acquired absence of both cervix and uterus: Secondary | ICD-10-CM

## 2014-12-19 DIAGNOSIS — Z952 Presence of prosthetic heart valve: Secondary | ICD-10-CM

## 2014-12-19 DIAGNOSIS — M5459 Other low back pain: Secondary | ICD-10-CM | POA: Diagnosis present

## 2014-12-19 DIAGNOSIS — Z87891 Personal history of nicotine dependence: Secondary | ICD-10-CM | POA: Diagnosis not present

## 2014-12-19 DIAGNOSIS — T148 Other injury of unspecified body region: Secondary | ICD-10-CM | POA: Diagnosis not present

## 2014-12-19 DIAGNOSIS — E785 Hyperlipidemia, unspecified: Secondary | ICD-10-CM | POA: Diagnosis present

## 2014-12-19 DIAGNOSIS — S32019A Unspecified fracture of first lumbar vertebra, initial encounter for closed fracture: Secondary | ICD-10-CM | POA: Diagnosis not present

## 2014-12-19 DIAGNOSIS — I509 Heart failure, unspecified: Secondary | ICD-10-CM | POA: Diagnosis not present

## 2014-12-19 DIAGNOSIS — S32050A Wedge compression fracture of fifth lumbar vertebra, initial encounter for closed fracture: Secondary | ICD-10-CM | POA: Diagnosis not present

## 2014-12-19 DIAGNOSIS — M545 Low back pain: Secondary | ICD-10-CM | POA: Diagnosis present

## 2014-12-19 DIAGNOSIS — G8929 Other chronic pain: Secondary | ICD-10-CM | POA: Diagnosis present

## 2014-12-19 DIAGNOSIS — E876 Hypokalemia: Secondary | ICD-10-CM | POA: Diagnosis present

## 2014-12-19 DIAGNOSIS — Z681 Body mass index (BMI) 19 or less, adult: Secondary | ICD-10-CM | POA: Diagnosis not present

## 2014-12-19 DIAGNOSIS — R109 Unspecified abdominal pain: Secondary | ICD-10-CM

## 2014-12-19 DIAGNOSIS — Z888 Allergy status to other drugs, medicaments and biological substances status: Secondary | ICD-10-CM | POA: Diagnosis not present

## 2014-12-19 DIAGNOSIS — I951 Orthostatic hypotension: Secondary | ICD-10-CM | POA: Diagnosis present

## 2014-12-19 DIAGNOSIS — M5416 Radiculopathy, lumbar region: Secondary | ICD-10-CM

## 2014-12-19 DIAGNOSIS — Z880 Allergy status to penicillin: Secondary | ICD-10-CM

## 2014-12-19 DIAGNOSIS — M4854XA Collapsed vertebra, not elsewhere classified, thoracic region, initial encounter for fracture: Secondary | ICD-10-CM | POA: Diagnosis present

## 2014-12-19 DIAGNOSIS — Z8249 Family history of ischemic heart disease and other diseases of the circulatory system: Secondary | ICD-10-CM

## 2014-12-19 DIAGNOSIS — K449 Diaphragmatic hernia without obstruction or gangrene: Secondary | ICD-10-CM | POA: Diagnosis present

## 2014-12-19 DIAGNOSIS — M549 Dorsalgia, unspecified: Secondary | ICD-10-CM | POA: Diagnosis not present

## 2014-12-19 DIAGNOSIS — R911 Solitary pulmonary nodule: Secondary | ICD-10-CM | POA: Diagnosis present

## 2014-12-19 DIAGNOSIS — R918 Other nonspecific abnormal finding of lung field: Secondary | ICD-10-CM | POA: Diagnosis present

## 2014-12-19 DIAGNOSIS — Z96641 Presence of right artificial hip joint: Secondary | ICD-10-CM | POA: Diagnosis present

## 2014-12-19 DIAGNOSIS — I5032 Chronic diastolic (congestive) heart failure: Secondary | ICD-10-CM | POA: Diagnosis present

## 2014-12-19 DIAGNOSIS — Z885 Allergy status to narcotic agent status: Secondary | ICD-10-CM | POA: Diagnosis not present

## 2014-12-19 DIAGNOSIS — I719 Aortic aneurysm of unspecified site, without rupture: Secondary | ICD-10-CM | POA: Diagnosis present

## 2014-12-19 DIAGNOSIS — K859 Acute pancreatitis without necrosis or infection, unspecified: Secondary | ICD-10-CM | POA: Diagnosis present

## 2014-12-19 DIAGNOSIS — M810 Age-related osteoporosis without current pathological fracture: Secondary | ICD-10-CM | POA: Diagnosis present

## 2014-12-19 DIAGNOSIS — Z882 Allergy status to sulfonamides status: Secondary | ICD-10-CM | POA: Diagnosis not present

## 2014-12-19 DIAGNOSIS — S32000A Wedge compression fracture of unspecified lumbar vertebra, initial encounter for closed fracture: Secondary | ICD-10-CM | POA: Insufficient documentation

## 2014-12-19 DIAGNOSIS — Z951 Presence of aortocoronary bypass graft: Secondary | ICD-10-CM | POA: Diagnosis not present

## 2014-12-19 DIAGNOSIS — M4856XA Collapsed vertebra, not elsewhere classified, lumbar region, initial encounter for fracture: Principal | ICD-10-CM | POA: Diagnosis present

## 2014-12-19 DIAGNOSIS — I340 Nonrheumatic mitral (valve) insufficiency: Secondary | ICD-10-CM | POA: Diagnosis present

## 2014-12-19 DIAGNOSIS — I110 Hypertensive heart disease with heart failure: Secondary | ICD-10-CM | POA: Diagnosis present

## 2014-12-19 DIAGNOSIS — E43 Unspecified severe protein-calorie malnutrition: Secondary | ICD-10-CM | POA: Diagnosis present

## 2014-12-19 DIAGNOSIS — Z7982 Long term (current) use of aspirin: Secondary | ICD-10-CM

## 2014-12-19 DIAGNOSIS — R001 Bradycardia, unspecified: Secondary | ICD-10-CM | POA: Diagnosis not present

## 2014-12-19 DIAGNOSIS — R030 Elevated blood-pressure reading, without diagnosis of hypertension: Secondary | ICD-10-CM | POA: Diagnosis not present

## 2014-12-19 DIAGNOSIS — IMO0002 Reserved for concepts with insufficient information to code with codable children: Secondary | ICD-10-CM

## 2014-12-19 DIAGNOSIS — I714 Abdominal aortic aneurysm, without rupture: Secondary | ICD-10-CM | POA: Diagnosis not present

## 2014-12-19 DIAGNOSIS — R52 Pain, unspecified: Secondary | ICD-10-CM | POA: Diagnosis not present

## 2014-12-19 HISTORY — DX: Aortic aneurysm of unspecified site, without rupture: I71.9

## 2014-12-19 LAB — COMPREHENSIVE METABOLIC PANEL
ALT: 10 U/L — ABNORMAL LOW (ref 14–54)
AST: 22 U/L (ref 15–41)
Albumin: 3.6 g/dL (ref 3.5–5.0)
Alkaline Phosphatase: 88 U/L (ref 38–126)
Anion gap: 11 (ref 5–15)
BUN: 8 mg/dL (ref 6–20)
CO2: 24 mmol/L (ref 22–32)
Calcium: 9.4 mg/dL (ref 8.9–10.3)
Chloride: 103 mmol/L (ref 101–111)
Creatinine, Ser: 0.77 mg/dL (ref 0.44–1.00)
GFR calc Af Amer: >60 mL/min (ref >60)
Glucose, Bld: 133 mg/dL — ABNORMAL HIGH (ref 65–99)
POTASSIUM: 3.3 mmol/L — AB (ref 3.5–5.1)
Sodium: 138 mmol/L (ref 135–145)
TOTAL PROTEIN: 6.9 g/dL (ref 6.5–8.1)
Total Bilirubin: 0.5 mg/dL (ref 0.3–1.2)

## 2014-12-19 LAB — URINALYSIS, ROUTINE W REFLEX MICROSCOPIC
Bilirubin Urine: NEGATIVE
Glucose, UA: NEGATIVE mg/dL
Hgb urine dipstick: NEGATIVE
KETONES UR: 15 mg/dL — AB
LEUKOCYTES UA: NEGATIVE
NITRITE: NEGATIVE
Protein, ur: NEGATIVE mg/dL
Specific Gravity, Urine: 1.009 (ref 1.005–1.030)
pH: 8 (ref 5.0–8.0)

## 2014-12-19 LAB — CBC
HEMATOCRIT: 41.1 % (ref 36.0–46.0)
Hemoglobin: 13.2 g/dL (ref 12.0–15.0)
MCH: 28.8 pg (ref 26.0–34.0)
MCHC: 32.1 g/dL (ref 30.0–36.0)
MCV: 89.5 fL (ref 78.0–100.0)
Platelets: 189 10*3/uL (ref 150–400)
RBC: 4.59 MIL/uL (ref 3.87–5.11)
RDW: 13.7 % (ref 11.5–15.5)
WBC: 9.1 10*3/uL (ref 4.0–10.5)

## 2014-12-19 LAB — TROPONIN I
Troponin I: <0.03 ng/mL (ref <0.031)
Troponin I: <0.03 ng/mL (ref <0.031)

## 2014-12-19 LAB — MAGNESIUM
Magnesium: 2 mg/dL (ref 1.7–2.4)
Magnesium: 2.1 mg/dL (ref 1.7–2.4)

## 2014-12-19 LAB — LIPASE, BLOOD: Lipase: 296 U/L — ABNORMAL HIGH (ref 11–51)

## 2014-12-19 LAB — PHOSPHORUS: PHOSPHORUS: 3.9 mg/dL (ref 2.5–4.6)

## 2014-12-19 LAB — TSH: TSH: 1.014 u[IU]/mL (ref 0.350–4.500)

## 2014-12-19 MED ORDER — ONDANSETRON HCL 4 MG PO TABS: 4.0000 mg | ORAL_TABLET | Freq: Four times a day (QID) | ORAL | Status: DC | PRN

## 2014-12-19 MED ORDER — ONDANSETRON HCL 4 MG/2ML IJ SOLN
4.0000 mg | Freq: Once | INTRAMUSCULAR | Status: AC
  Administered 2014-12-19: 4 mg via INTRAVENOUS
  Filled 2014-12-19: qty 2

## 2014-12-19 MED ORDER — ACETAMINOPHEN 650 MG RE SUPP: 650.0000 mg | Freq: Four times a day (QID) | RECTAL | Status: DC | PRN

## 2014-12-19 MED ORDER — HYDROMORPHONE HCL 1 MG/ML IJ SOLN
0.5000 mg | INTRAMUSCULAR | Status: DC | PRN
  Administered 2014-12-20 – 2014-12-24 (×3): 0.5 mg via INTRAVENOUS
  Filled 2014-12-19 (×3): qty 1

## 2014-12-19 MED ORDER — LISINOPRIL 5 MG PO TABS
5.0000 mg | ORAL_TABLET | Freq: Every day | ORAL | Status: DC
  Administered 2014-12-19 – 2014-12-22 (×4): 5 mg via ORAL
  Filled 2014-12-19 (×5): qty 1

## 2014-12-19 MED ORDER — SODIUM CHLORIDE 0.9 % IV SOLN
INTRAVENOUS | Status: AC
  Administered 2014-12-19 – 2014-12-20 (×2): via INTRAVENOUS

## 2014-12-19 MED ORDER — ALUM & MAG HYDROXIDE-SIMETH 200-200-20 MG/5ML PO SUSP: 30.0000 mL | Freq: Four times a day (QID) | ORAL | Status: DC | PRN

## 2014-12-19 MED ORDER — ACETAMINOPHEN 325 MG PO TABS
650.0000 mg | ORAL_TABLET | Freq: Four times a day (QID) | ORAL | Status: DC | PRN
  Administered 2014-12-24: 650 mg via ORAL
  Filled 2014-12-19: qty 2

## 2014-12-19 MED ORDER — ENOXAPARIN SODIUM 30 MG/0.3ML ~~LOC~~ SOLN
30.0000 mg | SUBCUTANEOUS | Status: DC
  Administered 2014-12-19 – 2014-12-24 (×5): 30 mg via SUBCUTANEOUS
  Filled 2014-12-19 (×7): qty 0.3

## 2014-12-19 MED ORDER — MORPHINE SULFATE (PF) 4 MG/ML IV SOLN
4.0000 mg | INTRAVENOUS | Status: DC | PRN
  Administered 2014-12-19 (×2): 4 mg via INTRAVENOUS
  Filled 2014-12-19 (×3): qty 1

## 2014-12-19 MED ORDER — HYDROCODONE-ACETAMINOPHEN 5-325 MG PO TABS
1.0000 | ORAL_TABLET | ORAL | Status: DC | PRN
  Administered 2014-12-20 (×2): 2 via ORAL
  Administered 2014-12-20: 1 via ORAL
  Administered 2014-12-21 – 2014-12-23 (×6): 2 via ORAL
  Administered 2014-12-24 – 2014-12-25 (×2): 1 via ORAL
  Filled 2014-12-19 (×7): qty 2
  Filled 2014-12-19 (×3): qty 1
  Filled 2014-12-19 (×2): qty 2

## 2014-12-19 MED ORDER — DOCUSATE SODIUM 100 MG PO CAPS
100.0000 mg | ORAL_CAPSULE | Freq: Two times a day (BID) | ORAL | Status: DC
Start: 2014-12-19 — End: 2014-12-25
  Administered 2014-12-19 – 2014-12-25 (×12): 100 mg via ORAL
  Filled 2014-12-19 (×12): qty 1

## 2014-12-19 MED ORDER — ONDANSETRON HCL 4 MG/2ML IJ SOLN
4.0000 mg | Freq: Four times a day (QID) | INTRAMUSCULAR | Status: DC | PRN
  Administered 2014-12-20: 4 mg via INTRAVENOUS
  Filled 2014-12-19: qty 2

## 2014-12-19 MED ORDER — POTASSIUM CHLORIDE CRYS ER 20 MEQ PO TBCR
40.0000 meq | EXTENDED_RELEASE_TABLET | Freq: Once | ORAL | Status: DC
  Filled 2014-12-19: qty 2

## 2014-12-19 MED ORDER — ATROPINE SULFATE 1 MG/ML IJ SOLN
1.0000 mg | Freq: Once | INTRAMUSCULAR | Status: DC
  Filled 2014-12-19: qty 1

## 2014-12-19 MED ORDER — POTASSIUM CHLORIDE CRYS ER 20 MEQ PO TBCR
40.0000 meq | EXTENDED_RELEASE_TABLET | ORAL | Status: AC
  Administered 2014-12-19 (×2): 40 meq via ORAL
  Filled 2014-12-19: qty 2

## 2014-12-19 MED ORDER — IOHEXOL 350 MG/ML SOLN
100.0000 mL | Freq: Once | INTRAVENOUS | Status: AC | PRN
  Administered 2014-12-19: 100 mL via INTRAVENOUS

## 2014-12-19 MED ORDER — SODIUM CHLORIDE 0.9 % IJ SOLN
3.0000 mL | Freq: Two times a day (BID) | INTRAMUSCULAR | Status: DC
  Administered 2014-12-20 – 2014-12-24 (×8): 3 mL via INTRAVENOUS

## 2014-12-19 MED ORDER — BISACODYL 10 MG RE SUPP
10.0000 mg | Freq: Every day | RECTAL | Status: DC | PRN
  Filled 2014-12-19: qty 1

## 2014-12-19 NOTE — Consult Note (Signed)
CARDIOLOGY CONSULT NOTE   Patient ID: Victoria Sexton MRN: [id number] DOB/AGE: January 11, 1927 81 y.o.  Admit date: 12/19/2014  Primary Physician   Vidal Schwalbe, MD Primary Cardiologist   Dr Marlou Porch Reason for Consultation   Bradycardia  [id number] Victoria Sexton is a 81 y.o. year old female with a history of CABG 2013 w/ LIMA to LAD, SVG to RCA, SVG-OM, PFO closure, endocardial mass removal (benign) and MV repair.   Admit 12/2013 w/ volume overload, diuresed and d/c'd.  Seen by Dr Marlou Porch 08/2014 and weight 95 lbs, had orthostatic hypotension, Lasix changed to PRN, HR 73 on Toprol XL 50 mg bid.   Victoria Sexton as chronic issues with back pain. It is difficult for her to get out of the chair or out of bed. About a week ago so, the symptoms worsened. She has been in bed for much of the last week because of her back. She denies other issues or concerns. She does not remember being lightheaded or weak. Her husband is present and is very concerned about her musculoskeletal issues but also does not mention anything regarding being lightheaded or any GI issues. She saw her family physician and a CT scan was performed. Results are below. The husband is concerned about her aneurysm but it is 2.6 cm in follow-up in 5 years is recommended. She has a chronic T7 compression fracture. And a compression fracture was also seen in L1.  For the last 24 hours, Victoria Sexton is not been able to get out of bed. She and her husband argued all night long about going to the ER and finally came in this morning.  In the ER, she was initially stable but hypertensive with blood pressure 220/100. On the monitor, she was noted to have a heart rate that would drop down into the 20s. She was having pauses up to 4 seconds and the next beat would be junctional, not sinus. According to the RN who was in the room with her, the bradycardia would precede the nausea.  Victoria Sexton got morphine 4 mg and Zofran totaling 8 mg IV. No other medications.  Her back pain is much improved. She denies any other symptoms. She is currently resting comfortably. She has not taken her medications this morning.   Past Medical History  Diagnosis Date  . Hypertension   . MVP (mitral valve prolapse)   . Osteoporosis   . Inguinal hernia right  . Mitral regurgitation due to cusp prolapse   . Cardiac tumor, ventricular     LVOT  . PVC (premature ventricular contraction)   . Hiatal hernia   . Coronary artery disease 10/04/2011  . Congestive heart failure ([place]) 10/04/2011  . Heart murmur   . Shortness of breath   . Anginal pain ([place])   . Hernia, hiatal     Left wears a hernia belt  . Skin cancer 2013    Face.  . S/P mitral valve repair 10/12/2011    Complex valvuloplasty including triangular resection of flail posterior leaflet with artificial Goretex neocord placement x2 and 26 mm Sorin Memo 3D ring annuloplasty  . S/P CABG x 3 10/12/2011    LIMA to LAD, SVG to OM, SVG to RCA, EVH via left thigh and leg  . Thoracic compression fracture ([place])   . Hx of CABG     -LIMA to LAD,SVG to RCA, SVG to OM, mitral valve repair, patent foramen ovale closure, benign-apearing mass removal.  . Aortic atherosclerosis ([place])  10/25/2012    Upper aortic atherosclerosis seen on CT scan 08/13/2002  . Mild malnutrition ([place]) 10/25/2012     Past Surgical History  Procedure Laterality Date  . Abdominal hysterectomy    . Leg fx repair      Right  . Hemorroidectomy    . Tee without cardioversion  11/13/2010    Procedure: TRANSESOPHAGEAL ECHOCARDIOGRAM (TEE);  Surgeon: Candee Furbish, MD;  [facility] ENDOSCOPY;  Service: Cardiovascular;  Laterality: N/A;  . Skin cancer excision  2013    not melanoma  . Hemorrhoid surgery    . Mitral valve repair  10/12/2011    Procedure: MITRAL VALVE REPAIR (MVR);  Surgeon: Rexene Alberts, MD;  Location: [facility];  Service: Open Heart Surgery;  Laterality: N/A;  MITRAL VALVE REPAIR  . Coronary artery bypass graft  10/12/2011    Procedure:  CORONARY ARTERY BYPASS GRAFTING (CABG);  Surgeon: Rexene Alberts, MD;  Location: [place];  Service: Open Heart Surgery;  Laterality: N/A;  RESECTION OF LV MASS  . Patent foramen ovale closure  10/12/2011    Procedure: PATENT FORAMEN OVALE CLOSURE;  Surgeon: Rexene Alberts, MD;  Location: [place];  Service: Open Heart Surgery;  Laterality: N/A;  . Left and right heart catheterization with coronary angiogram N/A 09/29/2011    Procedure: LEFT AND RIGHT HEART CATHETERIZATION WITH CORONARY ANGIOGRAM;  Surgeon: Candee Furbish, MD;  Location: [facility] CATH LAB;  Service: Cardiovascular;  Laterality: N/A;  . Hip arthroplasty Right 04/17/2014    Procedure: ARTHROPLASTY BIPOLAR HIP;  Surgeon: Paralee Cancel, MD;  Location: [facility] ORS;  Service: Orthopedics;  Laterality: Right;    Allergies  Allergen Reactions  . Hydrocodone-Acetaminophen Anxiety    Pt nervous and shaky per pt husband  . Penicillins Palpitations  . Cyclobenzaprine Other (See Comments)    Dry mouth  . Sulfa Antibiotics Rash    I have reviewed the patient's current medications: iohexol, morphine injection  Prior to Admission medications   Medication Sig Start Date End Date Taking? Authorizing Provider  aspirin 81 MG tablet Take 81 mg by mouth daily.   Yes Historical Provider, MD  Cholecalciferol (VITAMIN D3) 2000 UNITS TABS Take 2,000 Units by mouth daily.    Yes Historical Provider, MD  Coenzyme Q10 (CO Q-10) 50 MG CAPS Take 50 mg by mouth daily.   Yes Historical Provider, MD  ENSURE (ENSURE) Take 1 Can by mouth 3 (three) times daily between meals.   Yes Historical Provider, MD  lisinopril (PRINIVIL,ZESTRIL) 5 MG tablet TAKE 1 TABLET ONCE DAILY. 09/17/14  Yes Jerline Pain, MD  metoprolol succinate (TOPROL-XL) 100 MG 24 hr tablet TAKE 1 TABLET ONCE DAILY. 12/02/14  Yes Jerline Pain, MD  multivitamin-lutein [facility]) CAPS Take 1 capsule by mouth daily. 6MG    Yes Historical Provider, MD  OVER THE COUNTER MEDICATION Take 1 tablet by mouth daily.  Omega Red   Yes Historical Provider, MD  rivastigmine (EXELON) 9.5 mg/24hr Place 9.5 mg onto the skin daily.   Yes Historical Provider, MD  simvastatin (ZOCOR) 5 MG tablet TAKE 1 TABLET ONCE DAILY. 10/14/14  Yes Jerline Pain, MD  vitamin E 400 UNIT capsule Take 400 Units by mouth daily.   Yes Historical Provider, MD     Social History   Social History  . Marital Status: Married    Spouse Name: Ed  . Number of Children: 0  . Years of Education: 12   Occupational History  . retired     Science writer  Social History Main Topics  . Smoking status: Former Smoker -- 0.50 packs/day for 30 years    Types: Cigarettes    Quit date: 11/13/2010  . Smokeless tobacco: Never Used  . Alcohol Use: No  . Drug Use: No  . Sexual Activity: Not on file     Comment: did not ask   Other Topics Concern  . Not on file   Social History Narrative   Lives at home with husband   Caffeine use - coffee 4-5 cups decaf daily    Family Status  Relation Status Death Age  . Mother Deceased   . Father Deceased   . Maternal Grandmother Deceased   . Maternal Grandfather Deceased   . Paternal Grandmother Deceased   . Paternal Grandfather Deceased    Family History  Problem Relation Age of Onset  . Heart attack Mother   . Lung cancer Father      ROS:  Full 14 point review of systems complete and found to be negative unless listed above.  Physical Exam: Blood pressure 145/65, pulse 67, temperature 97.8 F (36.6 C), temperature source Oral, resp. rate 18, height 5\' 5"  (1.651 m), weight 95 lb (43.092 kg), SpO2 95 %.  General: Well developed, well nourished, female in no acute distress Head: Eyes PERRLA, No xanthomas.   Normocephalic and atraumatic, oropharynx without edema or exudate. Dentition:  Lungs:  Heart: HRRR S1 S2, no rub/gallop, 2/6 murmur. pulses are 2+ all 4 extrem.   Neck: No carotid bruits. No lymphadenopathy.  JVD not elevated. Abdomen: Bowel sounds present, abdomen soft and non-tender  without masses or hernias noted. Msk:  + spine, - cva tenderness. Generalized weakness, no joint deformities or effusions. Extremities: No clubbing or cyanosis. No edema.  Neuro: Alert and oriented X 2. No focal deficits noted. Psych:  Good affect, responds appropriately Skin: No rashes or lesions noted.  Labs:   Lab Results  Component Value Date   WBC 9.1 12/19/2014   HGB 13.2 12/19/2014   HCT 41.1 12/19/2014   MCV 89.5 12/19/2014   PLT 189 12/19/2014    Recent Labs Lab 12/19/14 0728  NA 138  K 3.3*  CL 103  CO2 24  BUN 8  CREATININE 0.77  CALCIUM 9.4  PROT 6.9  BILITOT 0.5  ALKPHOS 88  ALT 10*  AST 22  GLUCOSE 133*  ALBUMIN 3.6    Recent Labs  12/19/14 0728  TROPONINI <0.03   LIPASE  Date/Time Value Ref Range Status  12/19/2014 07:28 AM 296* 11 - 51 U/L Final   Echo: 01/2012 He had 55-60 percent, no regional wall motion abnormalities Moderate left atrial enlargement, normal right atrial size Mitral annuloplasty ring stable with no residual MR, mean gradient across mitral inflow is 12 mmHg Moderate TR Moderately elevated estimated RV pressure Mild calcification of the aortic valve with mild AF, peak velocity 2.3 m/s Grade 1A diastolic dysfunction with elevated left atrial pressure Left ventricle with severe asymmetrical septal hypertrophy  ECG:  12/19/2014 Sinus bradycardia, heart rate 59  Radiology:  Dg Lumbar Spine 2-3 Views 12/17/2014  CLINICAL DATA:  Low back pain. EXAM: LUMBAR SPINE - 2-3 VIEW COMPARISON:  11/15/2011. FINDINGS: Thoracolumbar spine degenerative change and scoliosis. No acute abnormality identified. Diffuse osteopenia. 3.3 cm abdominal aortic aneurysm noted. Aortic ultrasound can be obtained for further evaluation. Sliding hiatal hernia. Prior cardiac valve replacement. IMPRESSION: 1. Diffuse osteopenia degenerative change with scoliosis concave right. 2. 3.3 cm abdominal aortic aneurysm. Aortic ultrasound can be obtained  for further  evaluation. 3. Sliding hiatal hernia. Electronically Signed   By: Marcello Moores  Register   On: 12/17/2014 14:56   Dg Pelvis 1-2 Views 12/17/2014  CLINICAL DATA:  Low back pain with bilateral sciatica for 1 week. No reported injury. EXAM: PELVIS - 1-2 VIEW COMPARISON:  April 17, 2014. FINDINGS: Status post right hemiarthroplasty. Prosthesis appears well situated. No fracture or dislocation is noted. Left hip appears normal. IMPRESSION: No acute abnormality seen. Electronically Signed   By: Marijo Conception, M.D.   On: 12/17/2014 14:56   Ct Angio Chest Aorta W/cm &/or Wo/cm 12/19/2014  CLINICAL DATA:  81 year old female with abdominal aortic aneurysm and flank pain EXAM: CT ANGIOGRAPHY CHEST, ABDOMEN AND PELVIS TECHNIQUE: Multidetector CT imaging through the chest, abdomen and pelvis was performed using the standard protocol during bolus administration of intravenous contrast. Multiplanar reconstructed images and MIPs were obtained and reviewed to evaluate the vascular anatomy. CONTRAST:  100 mL Omni 350 COMPARISON:  None. FINDINGS: CTA CHEST FINDINGS Mediastinum: Unremarkable CT appearance of the thyroid gland for age. No suspicious mediastinal or hilar adenopathy. No soft tissue mediastinal mass. Large sliding hiatal hernia with nearly completely intra thoracic stomach. Heart/Vascular: Cardiomegaly. Patient is status post median sternotomy with evidence of multivessel CABG including LIMA to LAD bypass. No pericardial effusion. Surgical changes of prior mitral valve annuloplasty. No aortic dissection or aneurysm. Heterogeneous atherosclerotic plaque. Tortuous and elongated transverse and descending thoracic aorta. Four vessel arch. The left vertebral artery arises directly from the aorta. Lungs/Pleura: 6 mm ground-glass attenuation nodular opacity in the right lung apex (image 10 series 706). A 4 mm nodule in the periphery of the right upper lobe (image 21 series 706). There is a 3 mm nodule in the periphery of the  left upper lobe on image 26. 5 mm nodule in the periphery of the left lower lobe on image 42. There are a few additional technique scattered 2 mm subpleural nodules. Whole dependent atelectasis in the medial aspect of both lower lobes likely secondary to the large hiatal hernia. Trace scarring versus is bandlike atelectasis in the inferior lingula. Lungs are otherwise clear. No focal consolidation. Bones/Soft Tissues: No acute fracture or aggressive appearing lytic or blastic osseous lesion. Review of the MIP images confirms the above findings. CTA ABDOMEN AND PELVIS FINDINGS VASCULAR Aorta: Mild ectasia of the infrarenal abdominal aorta with a maximal diameter of 2.6 cm. Her scattered atherosclerotic vascular calcifications but no significant stenosis or evidence of dissection. Celiac: Widely patent. Conventional hepatic arterial anatomy. No visceral artery aneurysm. SMA: Atherosclerotic plaque in the mid portion without significant stenosis. No aneurysm. Renals: Single dominant renal arteries without evidence of significant stenosis, aneurysm or fibromuscular dysplasia. IMA: Patent and unremarkable. Inflow: Minimal atherosclerotic plaque without evidence of aneurysm or significant stenosis. Proximal Outflow: Middle atherosclerotic plaque without evidence of aneurysm or significant stenosis. Veins: No focal venous abnormality. NON-VASCULAR Abdomen: Intrathoracic stomach due to very large hiatal hernia. Unremarkable CT appearance of the spleen, adrenal glands and pancreas save for mild prominence of the main pancreatic duct. There is no associated atrophy or mass. Normal hepatic contour and morphology. There are at least 4 subcentimeter circumscribed low-attenuation lesions scattered throughout the liver which are too small to characterize but statistically highly likely benign cysts. The main portal veins are patent. Gallbladder is unremarkable. No intra or extrahepatic biliary ductal dilatation. Unremarkable  appearance of the bilateral kidneys. No focal solid lesion, hydronephrosis or nephrolithiasis. Small probable simple cyst upper pole left kidney. Extensive colonic  diverticulosis. No evidence of active inflammation. No evidence of bowel obstruction or focal bowel wall thickening. Moderate volume of formed stool in the rectum. Pelvis: Surgical changes of prior hysterectomy. The bladder is distended with urine. Streak artifact from right total hip arthroplasty slightly limits evaluation. Bones/Soft Tissues: Stable chronic T7 compression fracture with approximately 60% height loss. Age indeterminate compression fracture of the inferior endplate of L1 with was 20% height loss. Multilevel degenerative disc disease. Generalized demineralization. Review of the MIP images confirms the above findings.  IMPRESSION:  CTA CHEST 1. No evidence of aortic aneurysm or dissection. 2. Massive hiatal hernia with nearly completely intra thoracic stomach. 3. Atherosclerosis and multivessel coronary artery calcifications. 4. Surgical changes of prior multivessel CABG happened mitral valve annuloplasty. 5. Cardiomegaly. 6. Multiple bilateral pulmonary nodules measuring up to 5 mm. In the absence of a known primary malignancy these likely represent sequelae of prior infection/inflammation or a granulomatous process. Consider follow-up CT scan of the chest in 1 year to confirm stability. 7. Chronic T7 compression fracture.  CTA ABD/PELVIS 1. Fusiform ectasia of the infrarenal abdominal aorta with maximum transverse to the 2.6 cm. Ectatic abdominal aorta at risk for aneurysm development. Recommend followup by ultrasound in 5 years. This recommendation follows ACR consensus guidelines. 2. Extensive colonic diverticulosis without evidence of active inflammation. 3. Moderate volume retained stool in the rectum suggests an element of constipation. 4. Age indeterminate compression fracture of the inferior endplate of L1. Signed, Criselda Peaches, MD [facility] Electronically Signed   By: Jacqulynn Cadet M.D.   On: 12/19/2014 11:34    ASSESSMENT AND PLAN:   The patient was seen today by Dr Irish Lack, the patient evaluated and the data reviewed.   Principal Problem:   Bradycardia - transient w/ junctional escape beats - Hold metoprolol - wait 48 hours - see if has any more bradycardia - HR not low at baseline, but RN witnessed event and states clearly that bradycardia preceded nausea, so may not be a vagal response. - no current indication for PPM  Otherwise, per IM.  Active Problems:   Mitral regurgitation   Essential hypertension, benign   Coronary artery disease   Congestive heart failure (HCC)   HLD (hyperlipidemia)   Intractable low back pain   Pancreatitis   Compression fracture   Hypokalemia   Signed: Lenoard Aden 12/19/2014 12:43 PM Beeper [id number]  Co-Sign MD  I have examined the patient and reviewed assessment and plan and discussed with patient.  Agree with above as stated.  Significant bradycardia noted on tele.  Patient on Toprol.  Need to hold Toprol and see if bradycardia persists.  If it does persist, would have to consider EP evaluation for pacer.   Back pain was the reason she came to the ER.  Not syncope or lightheadedness.  RN witnessed slow HR event in ER and states clearly that bradycardia preceded nausea.  Gunnison Chahal S.

## 2014-12-19 NOTE — ED Notes (Signed)
Family states that monitor alarms then she gets nauseated. MD informed.

## 2014-12-19 NOTE — ED Notes (Signed)
MD at bedside. 

## 2014-12-19 NOTE — ED Notes (Signed)
Patient resting, no needs at this time; visitor at bedside reading a newspaper

## 2014-12-19 NOTE — ED Notes (Signed)
Pts Husband's phone numbers are. Cell: [phone], Home: [phone]  He would like to be contacted if the pt gets a bed upstairs.

## 2014-12-19 NOTE — ED Provider Notes (Signed)
CSN: [id number]     Arrival date & time 12/19/14  0708 History   First MD Initiated Contact with Patient 12/19/14 0715     Chief Complaint  Patient presents with  . Abdominal Pain      HPI  She presents for evaluation of back pain. Has had symptoms since Friday, 6 days ago. Started without trauma or injury. Midline low back. Radiates to both sides. No leg symptoms, i.e. no numbness weakness or symptoms to legs.  Seen by primary care physician 2 days ago, on Tuesday. Had outpatient x-rays of the back and pelvis. No acute bony abnormalities or compression fractures noted. However, noted to have aortic aneurysm. Instructed to schedule outpatient ultrasound, have not been able to have this done as yet.  Lightheaded and weak or dizzy. Did become nauseated this morning on the way to the hospital.   History of coronary artery disease, and bypass surgery, with Dr. Roxy Manns.Jearld Shines with cardiology, Dr. Marlou Porch.  Primary care is Harlan Stains M.D. at [facility]  Past Medical History  Diagnosis Date  . Hypertension   . MVP (mitral valve prolapse)   . Osteoporosis   . Inguinal hernia right  . Mitral regurgitation due to cusp prolapse   . Cardiac tumor, ventricular     LVOT  . PVC (premature ventricular contraction)   . Hiatal hernia   . Coronary artery disease 10/04/2011  . Congestive heart failure ([place]) 10/04/2011  . Heart murmur   . Shortness of breath   . Anginal pain ([place])   . Hernia, hiatal     Left wears a hernia belt  . Skin cancer 2013    Face.  . S/P mitral valve repair 10/12/2011    Complex valvuloplasty including triangular resection of flail posterior leaflet with artificial Goretex neocord placement x2 and 26 mm Sorin Memo 3D ring annuloplasty  . S/P CABG x 3 10/12/2011    LIMA to LAD, SVG to OM, SVG to RCA, EVH via left thigh and leg  . Thoracic compression fracture ([place])   . Hx of CABG     -LIMA to LAD,SVG to RCA, SVG to OM, mitral valve repair, patent foramen ovale  closure, benign-apearing mass removal.  . Aortic atherosclerosis ([place]) 10/25/2012    Upper aortic atherosclerosis seen on CT scan 08/13/2002  . Mild malnutrition ([place]) 10/25/2012  . Aortic aneurysm [facility])    Past Surgical History  Procedure Laterality Date  . Abdominal hysterectomy    . Leg fx repair      Right  . Hemorroidectomy    . Tee without cardioversion  11/13/2010    Procedure: TRANSESOPHAGEAL ECHOCARDIOGRAM (TEE);  Surgeon: Candee Furbish, MD;  Location: [facility] ENDOSCOPY;  Service: Cardiovascular;  Laterality: N/A;  . Skin cancer excision  2013    not melanoma  . Hemorrhoid surgery    . Mitral valve repair  10/12/2011    Procedure: MITRAL VALVE REPAIR (MVR);  Surgeon: Rexene Alberts, MD;  Location: [place];  Service: Open Heart Surgery;  Laterality: N/A;  MITRAL VALVE REPAIR  . Coronary artery bypass graft  10/12/2011    Procedure: CORONARY ARTERY BYPASS GRAFTING (CABG);  Surgeon: Rexene Alberts, MD;  Location: [place];  Service: Open Heart Surgery;  Laterality: N/A;  RESECTION OF LV MASS  . Patent foramen ovale closure  10/12/2011    Procedure: PATENT FORAMEN OVALE CLOSURE;  Surgeon: Rexene Alberts, MD;  Location: [place];  Service: Open Heart Surgery;  Laterality: N/A;  . Left and  right heart catheterization with coronary angiogram N/A 09/29/2011    Procedure: LEFT AND RIGHT HEART CATHETERIZATION WITH CORONARY ANGIOGRAM;  Surgeon: Candee Furbish, MD;  Location: [facility] CATH LAB;  Service: Cardiovascular;  Laterality: N/A;  . Hip arthroplasty Right 04/17/2014    Procedure: ARTHROPLASTY BIPOLAR HIP;  Surgeon: Paralee Cancel, MD;  Location: [facility] ORS;  Service: Orthopedics;  Laterality: Right;   Family History  Problem Relation Age of Onset  . Heart attack Mother   . Lung cancer Father    Social History  Substance Use Topics  . Smoking status: Former Smoker -- 0.50 packs/day for 30 years    Types: Cigarettes    Quit date: 11/13/2010  . Smokeless tobacco: Never Used  . Alcohol Use: No   OB History      No data available     Review of Systems  Constitutional: Negative for fever, chills, diaphoresis, appetite change and fatigue.  HENT: Negative for mouth sores, sore throat and trouble swallowing.   Eyes: Negative for visual disturbance.  Respiratory: Negative for cough, chest tightness, shortness of breath and wheezing.   Cardiovascular: Negative for chest pain.  Gastrointestinal: Positive for nausea. Negative for vomiting, abdominal pain, diarrhea and abdominal distention.  Endocrine: Negative for polydipsia, polyphagia and polyuria.  Genitourinary: Negative for dysuria, frequency and hematuria.  Musculoskeletal: Positive for back pain. Negative for gait problem.  Skin: Negative for color change, pallor and rash.  Neurological: Negative for dizziness, syncope, light-headedness and headaches.  Hematological: Does not bruise/bleed easily.  Psychiatric/Behavioral: Negative for behavioral problems and confusion.      Allergies  Hydrocodone-acetaminophen; Penicillins; Cyclobenzaprine; and Sulfa antibiotics  Home Medications   Prior to Admission medications   Medication Sig Start Date End Date Taking? Authorizing Provider  aspirin 81 MG tablet Take 81 mg by mouth daily.   Yes Historical Provider, MD  Cholecalciferol (VITAMIN D3) 2000 UNITS TABS Take 2,000 Units by mouth daily.    Yes Historical Provider, MD  Coenzyme Q10 (CO Q-10) 50 MG CAPS Take 50 mg by mouth daily.   Yes Historical Provider, MD  ENSURE (ENSURE) Take 1 Can by mouth 3 (three) times daily between meals.   Yes Historical Provider, MD  lisinopril (PRINIVIL,ZESTRIL) 5 MG tablet TAKE 1 TABLET ONCE DAILY. 09/17/14  Yes Jerline Pain, MD  metoprolol succinate (TOPROL-XL) 100 MG 24 hr tablet TAKE 1 TABLET ONCE DAILY. 12/02/14  Yes Jerline Pain, MD  multivitamin-lutein [facility]) CAPS Take 1 capsule by mouth daily. 6MG    Yes Historical Provider, MD  OVER THE COUNTER MEDICATION Take 1 tablet by mouth daily. Omega Red    Yes Historical Provider, MD  rivastigmine (EXELON) 9.5 mg/24hr Place 9.5 mg onto the skin daily.   Yes Historical Provider, MD  simvastatin (ZOCOR) 5 MG tablet TAKE 1 TABLET ONCE DAILY. 10/14/14  Yes Jerline Pain, MD  vitamin E 400 UNIT capsule Take 400 Units by mouth daily.   Yes Historical Provider, MD  metoprolol succinate (TOPROL-XL) 50 MG 24 hr tablet Take 1 tablet (50 mg total) by mouth 2 (two) times daily. Take with or immediately following a meal. Patient not taking: Reported on 12/19/2014 12/29/13   Barton Dubois, MD   BP 164/78 mmHg  Pulse 84  Temp(Src) 98.4 F (36.9 C) (Oral)  Resp 17  Ht 5\' 5"  (1.651 m)  Wt 96 lb 5.5 oz (43.7 kg)  BMI 16.03 kg/m2  SpO2 97% Physical Exam  Constitutional: She is oriented to person, place, and time. No distress.  Thin, somewhat frail appearing.  HENT:  Head: Normocephalic.  Eyes: Conjunctivae are normal. Pupils are equal, round, and reactive to light. No scleral icterus.  Neck: Normal range of motion. Neck supple. No thyromegaly present.  Cardiovascular: Normal rate and regular rhythm.  Exam reveals no gallop and no friction rub.   No murmur heard. Pulmonary/Chest: Effort normal and breath sounds normal. No respiratory distress. She has no wheezes. She has no rales.  Abdominal: Soft. Bowel sounds are normal. She exhibits no distension. There is no tenderness. There is no rebound.  Palpable supraumbilical aortic pulsation. Palpation over the aorta does not reproduce her back pain. Strong femoral pulses and normal lower external reperfusion. No rebound or tenderness to the abdomen.  Musculoskeletal: Normal range of motion.       Back:  Midline, an immediate paraspinal tenderness. No rash lesions or vesicles.  Neurological: She is alert and oriented to person, place, and time.  Skin: Skin is warm and dry. No rash noted.  Psychiatric: She has a normal mood and affect. Her behavior is normal.    ED Course  Procedures (including critical  care time) Labs Review Labs Reviewed  LIPASE, BLOOD - Abnormal; Notable for the following:    Lipase 296 (*)    All other components within normal limits  COMPREHENSIVE METABOLIC PANEL - Abnormal; Notable for the following:    Potassium 3.3 (*)    Glucose, Bld 133 (*)    ALT 10 (*)    All other components within normal limits  URINALYSIS, ROUTINE W REFLEX MICROSCOPIC (NOT AT [facility]) - Abnormal; Notable for the following:    Ketones, ur 15 (*)    All other components within normal limits  HEPATIC FUNCTION PANEL - Abnormal; Notable for the following:    Total Protein 5.9 (*)    Albumin 3.1 (*)    ALT 8 (*)    Bilirubin, Direct <0.1 (*)    All other components within normal limits  COMPREHENSIVE METABOLIC PANEL - Abnormal; Notable for the following:    Glucose, Bld 122 (*)    Total Protein 5.9 (*)    Albumin 2.8 (*)    ALT 10 (*)    All other components within normal limits  CBC  TROPONIN I  MAGNESIUM  BASIC METABOLIC PANEL  CBC  TSH  MAGNESIUM  PHOSPHORUS  TROPONIN I  TROPONIN I  LIPASE, BLOOD  VITAMIN D 25 HYDROXY (VIT D DEFICIENCY, FRACTURES)  PROTIME-INR  APTT  CBC    Imaging Review Mr Lumbar Spine W Wo Contrast  12/20/2014  CLINICAL DATA:  Patient fell in parking lot. Pain radiates to RIGHT hip. EXAM: MRI LUMBAR SPINE WITHOUT AND WITH CONTRAST TECHNIQUE: Multiplanar and multiecho pulse sequences of the lumbar spine were obtained without and with intravenous contrast. CONTRAST:  35mL MULTIHANCE GADOBENATE DIMEGLUMINE 529 MG/ML IV SOLN COMPARISON:  Lumbar spine radiograph 12/17/2014. CTA chest and abdomen 12/19/2014. FINDINGS: Segmentation: Normal. Alignment:  Normal except for trace retrolisthesis L1-2. Vertebrae: There is an acute compression fracture of L1, primarily inferior endplate deformity, but also rightward anterior inferior chip fractures, with edema confined to the lower 1/2 of the vertebral body, only minor wedging, and prevertebral soft tissue swelling. As  demonstrated to greater detail on CT axial coronal images, the lateral inferior aspect of the vertebral body which is associated with osseous ridging, has fractured on its RIGHT side, and is displaced laterally. Tiny corner fracture anteriorly superiorly at L2, minor compared to the edema throughout L1. No significant retropulsion  or epidural hematoma.No other worrisome osseous lesions. Conus medullaris: Normal in size, and signal. Trace low termination. Paraspinal tissues: No evidence for hydronephrosis. Renal cystic disease, incompletely evaluated. Abdominal aortic ectasia up to 28 mm diameter. Ectatic abdominal aorta at risk for aneurysm development. Recommend followup by ultrasound in 5 years. This recommendation follows ACR consensus guidelines: White Paper of the ACR Incidental Findings Committee II on Vascular Findings. J Am Coll Radiol 2013; 10:789-794. Disc levels: L1-L2: There is mild annular bulging/subligamentous protrusion, and disc osteophyte complex, eccentric to the RIGHT. No definite subarticular zone narrowing or epidural hematoma but asymmetric RIGHT-sided foraminal narrowing likely irritates the RIGHT L1 nerve root. In addition the RIGHT L1 nerve root is likely displace in its extraforaminal course due to fractures of the RIGHT lateral inferior aspect of that vertebral body. See coronal CTA image 98 series 702, and axial CTA images 141-145 series 701. Pedicles are intact. L2-L3:  Tiny annular rent.  No impingement. L3-L4:  Unremarkable. L4-L5:  Unremarkable. L5-S1:  Unremarkable. Compared with plain films, the fracture is difficult to see due to osteopenia IMPRESSION: Acute compression fracture of L1, consistent with a posttraumatic osteopenic (nonpathologic) injury, with bone marrow edema primarily confined to the inferior 1/2 of the vertebral body. Inferior endplate deformity, with anterior chip fracture. Anterior prevertebral hematoma. The patient's RIGHT hip pain may relate to asymmetric  foraminal narrowing at L1-2 on the RIGHT due to disc osteophyte complex as well as displacement of the extraforaminal nerve alongside the injured lateral inferior aspect of L1. See discussion above. Tiny anterior superior corner injury to L2, without wedging. The patient may be a candidate for vertebral augmentation if conservative measures do not result in improvement. Electronically Signed   By: Staci Righter M.D.   On: 12/20/2014 13:38   Ct Angio Chest Aorta W/cm &/or Wo/cm  12/19/2014  CLINICAL DATA:  81 year old female with abdominal aortic aneurysm and flank pain EXAM: CT ANGIOGRAPHY CHEST, ABDOMEN AND PELVIS TECHNIQUE: Multidetector CT imaging through the chest, abdomen and pelvis was performed using the standard protocol during bolus administration of intravenous contrast. Multiplanar reconstructed images and MIPs were obtained and reviewed to evaluate the vascular anatomy. CONTRAST:  100 mL Omni 350 COMPARISON:  None. FINDINGS: CTA CHEST FINDINGS Mediastinum: Unremarkable CT appearance of the thyroid gland for age. No suspicious mediastinal or hilar adenopathy. No soft tissue mediastinal mass. Large sliding hiatal hernia with nearly completely intra thoracic stomach. Heart/Vascular: Cardiomegaly. Patient is status post median sternotomy with evidence of multivessel CABG including LIMA to LAD bypass. No pericardial effusion. Surgical changes of prior mitral valve annuloplasty. No aortic dissection or aneurysm. Heterogeneous atherosclerotic plaque. Tortuous and elongated transverse and descending thoracic aorta. Four vessel arch. The left vertebral artery arises directly from the aorta. Lungs/Pleura: 6 mm ground-glass attenuation nodular opacity in the right lung apex (image 10 series 706). A 4 mm nodule in the periphery of the right upper lobe (image 21 series 706). There is a 3 mm nodule in the periphery of the left upper lobe on image 26. 5 mm nodule in the periphery of the left lower lobe on image 42.  There are a few additional technique scattered 2 mm subpleural nodules. Whole dependent atelectasis in the medial aspect of both lower lobes likely secondary to the large hiatal hernia. Trace scarring versus is bandlike atelectasis in the inferior lingula. Lungs are otherwise clear. No focal consolidation. Bones/Soft Tissues: No acute fracture or aggressive appearing lytic or blastic osseous lesion. Review of the MIP images confirms  the above findings. CTA ABDOMEN AND PELVIS FINDINGS VASCULAR Aorta: Mild ectasia of the infrarenal abdominal aorta with a maximal diameter of 2.6 cm. Her scattered atherosclerotic vascular calcifications but no significant stenosis or evidence of dissection. Celiac: Widely patent. Conventional hepatic arterial anatomy. No visceral artery aneurysm. SMA: Atherosclerotic plaque in the mid portion without significant stenosis. No aneurysm. Renals: Single dominant renal arteries without evidence of significant stenosis, aneurysm or fibromuscular dysplasia. IMA: Patent and unremarkable. Inflow: Minimal atherosclerotic plaque without evidence of aneurysm or significant stenosis. Proximal Outflow: Middle atherosclerotic plaque without evidence of aneurysm or significant stenosis. Veins: No focal venous abnormality. NON-VASCULAR Abdomen: Intrathoracic stomach due to very large hiatal hernia. Unremarkable CT appearance of the spleen, adrenal glands and pancreas save for mild prominence of the main pancreatic duct. There is no associated atrophy or mass. Normal hepatic contour and morphology. There are at least 4 subcentimeter circumscribed low-attenuation lesions scattered throughout the liver which are too small to characterize but statistically highly likely benign cysts. The main portal veins are patent. Gallbladder is unremarkable. No intra or extrahepatic biliary ductal dilatation. Unremarkable appearance of the bilateral kidneys. No focal solid lesion, hydronephrosis or nephrolithiasis.  Small probable simple cyst upper pole left kidney. Extensive colonic diverticulosis. No evidence of active inflammation. No evidence of bowel obstruction or focal bowel wall thickening. Moderate volume of formed stool in the rectum. Pelvis: Surgical changes of prior hysterectomy. The bladder is distended with urine. Streak artifact from right total hip arthroplasty slightly limits evaluation. Bones/Soft Tissues: Stable chronic T7 compression fracture with approximately 60% height loss. Age indeterminate compression fracture of the inferior endplate of L1 with was 20% height loss. Multilevel degenerative disc disease. Generalized demineralization. Review of the MIP images confirms the above findings. IMPRESSION: CTA CHEST 1. No evidence of aortic aneurysm or dissection. 2. Massive hiatal hernia with nearly completely intra thoracic stomach. 3. Atherosclerosis and multivessel coronary artery calcifications. 4. Surgical changes of prior multivessel CABG happened mitral valve annuloplasty. 5. Cardiomegaly. 6. Multiple bilateral pulmonary nodules measuring up to 5 mm. In the absence of a known primary malignancy these likely represent sequelae of prior infection/inflammation or a granulomatous process. Consider follow-up CT scan of the chest in 1 year to confirm stability. 7. Chronic T7 compression fracture. CTA ABD/PELVIS 1. Fusiform ectasia of the infrarenal abdominal aorta with maximum transverse to the 2.6 cm. Ectatic abdominal aorta at risk for aneurysm development. Recommend followup by ultrasound in 5 years. This recommendation follows ACR consensus guidelines: White Paper of the ACR Incidental Findings Committee II on Vascular Findings. J Am Coll Radiol 2013; 10:789-794. 2. Extensive colonic diverticulosis without evidence of active inflammation. 3. Moderate volume retained stool in the rectum suggests an element of constipation. 4. Age indeterminate compression fracture of the inferior endplate of L1. Signed,  Criselda Peaches, MD [facility] Radiology Electronically Signed   By: Jacqulynn Cadet M.D.   On: 12/19/2014 11:34   Ct Angio Abd/pel W/ And/or W/o  12/19/2014  CLINICAL DATA:  81 year old female with abdominal aortic aneurysm and flank pain EXAM: CT ANGIOGRAPHY CHEST, ABDOMEN AND PELVIS TECHNIQUE: Multidetector CT imaging through the chest, abdomen and pelvis was performed using the standard protocol during bolus administration of intravenous contrast. Multiplanar reconstructed images and MIPs were obtained and reviewed to evaluate the vascular anatomy. CONTRAST:  100 mL Omni 350 COMPARISON:  None. FINDINGS: CTA CHEST FINDINGS Mediastinum: Unremarkable CT appearance of the thyroid gland for age. No suspicious mediastinal or hilar adenopathy. No  soft tissue mediastinal mass. Large sliding hiatal hernia with nearly completely intra thoracic stomach. Heart/Vascular: Cardiomegaly. Patient is status post median sternotomy with evidence of multivessel CABG including LIMA to LAD bypass. No pericardial effusion. Surgical changes of prior mitral valve annuloplasty. No aortic dissection or aneurysm. Heterogeneous atherosclerotic plaque. Tortuous and elongated transverse and descending thoracic aorta. Four vessel arch. The left vertebral artery arises directly from the aorta. Lungs/Pleura: 6 mm ground-glass attenuation nodular opacity in the right lung apex (image 10 series 706). A 4 mm nodule in the periphery of the right upper lobe (image 21 series 706). There is a 3 mm nodule in the periphery of the left upper lobe on image 26. 5 mm nodule in the periphery of the left lower lobe on image 42. There are a few additional technique scattered 2 mm subpleural nodules. Whole dependent atelectasis in the medial aspect of both lower lobes likely secondary to the large hiatal hernia. Trace scarring versus is bandlike atelectasis in the inferior lingula. Lungs are otherwise  clear. No focal consolidation. Bones/Soft Tissues: No acute fracture or aggressive appearing lytic or blastic osseous lesion. Review of the MIP images confirms the above findings. CTA ABDOMEN AND PELVIS FINDINGS VASCULAR Aorta: Mild ectasia of the infrarenal abdominal aorta with a maximal diameter of 2.6 cm. Her scattered atherosclerotic vascular calcifications but no significant stenosis or evidence of dissection. Celiac: Widely patent. Conventional hepatic arterial anatomy. No visceral artery aneurysm. SMA: Atherosclerotic plaque in the mid portion without significant stenosis. No aneurysm. Renals: Single dominant renal arteries without evidence of significant stenosis, aneurysm or fibromuscular dysplasia. IMA: Patent and unremarkable. Inflow: Minimal atherosclerotic plaque without evidence of aneurysm or significant stenosis. Proximal Outflow: Middle atherosclerotic plaque without evidence of aneurysm or significant stenosis. Veins: No focal venous abnormality. NON-VASCULAR Abdomen: Intrathoracic stomach due to very large hiatal hernia. Unremarkable CT appearance of the spleen, adrenal glands and pancreas save for mild prominence of the main pancreatic duct. There is no associated atrophy or mass. Normal hepatic contour and morphology. There are at least 4 subcentimeter circumscribed low-attenuation lesions scattered throughout the liver which are too small to characterize but statistically highly likely benign cysts. The main portal veins are patent. Gallbladder is unremarkable. No intra or extrahepatic biliary ductal dilatation. Unremarkable appearance of the bilateral kidneys. No focal solid lesion, hydronephrosis or nephrolithiasis. Small probable simple cyst upper pole left kidney. Extensive colonic diverticulosis. No evidence of active inflammation. No evidence of bowel obstruction or focal bowel wall thickening. Moderate volume of formed stool in the rectum. Pelvis: Surgical changes of prior hysterectomy.  The bladder is distended with urine. Streak artifact from right total hip arthroplasty slightly limits evaluation. Bones/Soft Tissues: Stable chronic T7 compression fracture with approximately 60% height loss. Age indeterminate compression fracture of the inferior endplate of L1 with was 20% height loss. Multilevel degenerative disc disease. Generalized demineralization. Review of the MIP images confirms the above findings. IMPRESSION: CTA CHEST 1. No evidence of aortic aneurysm or dissection. 2. Massive hiatal hernia with nearly completely intra thoracic stomach. 3. Atherosclerosis and multivessel coronary artery calcifications. 4. Surgical changes of prior multivessel CABG happened mitral valve annuloplasty. 5. Cardiomegaly. 6. Multiple bilateral pulmonary nodules measuring up to 5 mm. In the absence of a known primary malignancy these likely represent sequelae of prior infection/inflammation or a granulomatous process. Consider follow-up CT scan of the chest in 1 year to confirm stability. 7. Chronic T7 compression fracture. CTA ABD/PELVIS 1. Fusiform ectasia of the infrarenal abdominal aorta with maximum transverse  to the 2.6 cm. Ectatic abdominal aorta at risk for aneurysm development. Recommend followup by ultrasound in 5 years. This recommendation follows ACR consensus guidelines: White Paper of the ACR Incidental Findings Committee II on Vascular Findings. J Am Coll Radiol 2013; 10:789-794. 2. Extensive colonic diverticulosis without evidence of active inflammation. 3. Moderate volume retained stool in the rectum suggests an element of constipation. 4. Age indeterminate compression fracture of the inferior endplate of L1. Signed, Criselda Peaches, MD [facility] Radiology Electronically Signed   By: Jacqulynn Cadet M.D.   On: 12/19/2014 11:34   I have personally reviewed and evaluated these images and lab results as part of my medical  decision-making.   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 No other acute intra-abdominal abnormalities noted. Slight constipation. Compression fracture endplate L1.  075-GRM:   Pt with 2 episodes of Bradycardia.  First with transferring to commode.  Had Back Pain, developed nausea, and "felt like I'm going to pass out".  Sinus brady to 40 noted.  Second episode occurred while supine in bed.  C/o lightheadedness, RN noted SB to 38 on monitor.  Pt c/o nausea.  Both episodes resolved spontaneously.    Tanna Furry, MD 12/21/14 [phone]

## 2014-12-19 NOTE — ED Notes (Signed)
Per EMS, Pt is coming from home. Pt reports right flank pain with back pain that she has had for several weeks, but got worse last night. Pt has been recently diagnosed with Aortic Aneurysm on Tuesday. Pt originally did not want to come in because her pain decreased, but pt decided to come in. Pt denies any other symptoms. Denied headache, nausea, vomiting, or diarrhea. Vitals per EMS: 170/94 Initial, 220/100. 97% on RA.

## 2014-12-19 NOTE — ED Notes (Signed)
Pt reports she is not constantly nauseated; it comes and goes suddenly. MD informed.

## 2014-12-19 NOTE — H&P (Signed)
[facility] History and Physical  Victoria Sexton [id number] DOB: March 26, 1927 DOA: 12/19/2014  Referring physician: Tanna Furry PCP: Vidal Schwalbe, MD   Chief Complaint: back pain  HPI: Victoria Sexton is a pleasant 81 y.o. female with a past medical history that includes diastolic heart failure, CAD, ischemic heart disease status post CABG 2013, history of mitral valve repair and angioplasty since emergency department with one-week history of intractable back pain. Initial evaluation in the emergency department reveals L1 compression fracture age-indeterminate, symptomatic bradycardia, pancreatitis.  She reports chronic back pain since her fall in April however the last week lower back pain worsening. He denies any fall or muscle strain. He reports the pain is constant sharp located intermittent low back radiating to both hips. She denies any numbness or tingling of her lower extremities. She reports being "steady on my feet" not needing a walker or cane she reports no falls since April and her husband who is at the bedside confirms. She was seen by PCP 2 days ago and had outpatient imaging revealed no bony abnormalities. While in the emergency department she got up to the bedside commode heart rate dropped to 40 she became slightly diaphoretic and complained of dizziness and nausea". A similar episode while lying in the bed heart rate went to 42. She reports no chest pain palpitations fever chills recent sick contacts. She reports no abdominal pain nausea vomiting diarrhea. She reports a tendency towards constipation last bowel movement 2 days ago. Reports decreased oral intake denies unintentional weight loss  In the emergency department she is afebrile with a blood pressure 145/59. Heart rate range is 40 -67. She is not hypoxic  Review of Systems:  10 point review of systems complete and all systems are negative except as indicated in the history of present illness Past Medical History    Diagnosis Date  . Hypertension   . MVP (mitral valve prolapse)   . Osteoporosis   . Inguinal hernia right  . Mitral regurgitation due to cusp prolapse   . Cardiac tumor, ventricular     LVOT  . PVC (premature ventricular contraction)   . Hiatal hernia   . Coronary artery disease 10/04/2011  . Congestive heart failure ([place]) 10/04/2011  . Heart murmur   . Shortness of breath   . Anginal pain ([place])   . Hernia, hiatal     Left wears a hernia belt  . Skin cancer 2013    Face.  . S/P mitral valve repair 10/12/2011    Complex valvuloplasty including triangular resection of flail posterior leaflet with artificial Goretex neocord placement x2 and 26 mm Sorin Memo 3D ring annuloplasty  . S/P CABG x 3 10/12/2011    LIMA to LAD, SVG to OM, SVG to RCA, EVH via left thigh and leg  . Thoracic compression fracture ([place])   . Hx of CABG     -LIMA to LAD,SVG to RCA, SVG to OM, mitral valve repair, patent foramen ovale closure, benign-apearing mass removal.  . Aortic atherosclerosis ([place]) 10/25/2012    Upper aortic atherosclerosis seen on CT scan 08/13/2002  . Mild malnutrition ([place]) 10/25/2012   Past Surgical History  Procedure Laterality Date  . Abdominal hysterectomy    . Leg fx repair      Right  . Hemorroidectomy    . Tee without cardioversion  11/13/2010    Procedure: TRANSESOPHAGEAL ECHOCARDIOGRAM (TEE);  Surgeon: Candee Furbish, MD;  Location: [facility] ENDOSCOPY;  Service: Cardiovascular;  Laterality: N/A;  . Skin  cancer excision  2013    not melanoma  . Hemorrhoid surgery    . Mitral valve repair  10/12/2011    Procedure: MITRAL VALVE REPAIR (MVR);  Surgeon: Rexene Alberts, MD;  Location: [place];  Service: Open Heart Surgery;  Laterality: N/A;  MITRAL VALVE REPAIR  . Coronary artery bypass graft  10/12/2011    Procedure: CORONARY ARTERY BYPASS GRAFTING (CABG);  Surgeon: Rexene Alberts, MD;  Location: [place];  Service: Open Heart Surgery;  Laterality: N/A;  RESECTION OF LV MASS  . Patent foramen  ovale closure  10/12/2011    Procedure: PATENT FORAMEN OVALE CLOSURE;  Surgeon: Rexene Alberts, MD;  Location: [place];  Service: Open Heart Surgery;  Laterality: N/A;  . Left and right heart catheterization with coronary angiogram N/A 09/29/2011    Procedure: LEFT AND RIGHT HEART CATHETERIZATION WITH CORONARY ANGIOGRAM;  Surgeon: Candee Furbish, MD;  Location: [facility] CATH LAB;  Service: Cardiovascular;  Laterality: N/A;  . Hip arthroplasty Right 04/17/2014    Procedure: ARTHROPLASTY BIPOLAR HIP;  Surgeon: Paralee Cancel, MD;  Location: [facility] ORS;  Service: Orthopedics;  Laterality: Right;   Social History:  reports that she quit smoking about 4 years ago. Her smoking use included Cigarettes. She has a 15 pack-year smoking history. She has never used smokeless tobacco. She reports that she does not drink alcohol or use illicit drugs. She lives at home with her husband which she is done for 60 years. She is a retired Customer service manager for [employer] via she is independent with ADLs Allergies  Allergen Reactions  . Hydrocodone-Acetaminophen Anxiety    Pt nervous and shaky per pt husband  . Penicillins Palpitations  . Cyclobenzaprine Other (See Comments)    Dry mouth  . Sulfa Antibiotics Rash    Family History  Problem Relation Age of Onset  . Heart attack Mother   . Lung cancer Father      Prior to Admission medications   Medication Sig Start Date End Date Taking? Authorizing Provider  aspirin 81 MG tablet Take 81 mg by mouth daily.   Yes Historical Provider, MD  Cholecalciferol (VITAMIN D3) 2000 UNITS TABS Take 2,000 Units by mouth daily.    Yes Historical Provider, MD  Coenzyme Q10 (CO Q-10) 50 MG CAPS Take 50 mg by mouth daily.   Yes Historical Provider, MD  ENSURE (ENSURE) Take 1 Can by mouth 3 (three) times daily between meals.   Yes Historical Provider, MD  lisinopril (PRINIVIL,ZESTRIL) 5 MG tablet TAKE 1 TABLET ONCE DAILY. 09/17/14  Yes Jerline Pain, MD  metoprolol succinate (TOPROL-XL) 100 MG 24 hr tablet  TAKE 1 TABLET ONCE DAILY. 12/02/14  Yes Jerline Pain, MD  multivitamin-lutein [facility]) CAPS Take 1 capsule by mouth daily. 6MG    Yes Historical Provider, MD  OVER THE COUNTER MEDICATION Take 1 tablet by mouth daily. Omega Red   Yes Historical Provider, MD  rivastigmine (EXELON) 9.5 mg/24hr Place 9.5 mg onto the skin daily.   Yes Historical Provider, MD  simvastatin (ZOCOR) 5 MG tablet TAKE 1 TABLET ONCE DAILY. 10/14/14  Yes Jerline Pain, MD  vitamin E 400 UNIT capsule Take 400 Units by mouth daily.   Yes Historical Provider, MD  metoprolol succinate (TOPROL-XL) 50 MG 24 hr tablet Take 1 tablet (50 mg total) by mouth 2 (two) times daily. Take with or immediately following a meal. Patient not taking: Reported on 12/19/2014 12/29/13   Barton Dubois, MD   Physical Exam: Danley Danker Vitals:  12/19/14 1130 12/19/14 1145 12/19/14 1200 12/19/14 1215  BP: 145/63 145/59 145/65   Pulse: 69 69 67 67  Temp:      TempSrc:      Resp:    18  Height:      Weight:      SpO2: 93% 91% 95% 95%    Wt Readings from Last 3 Encounters:  12/19/14 43.092 kg (95 lb)  08/21/14 43.455 kg (95 lb 12.8 oz)  08/20/14 43.364 kg (95 lb 9.6 oz)    General:  Appears calm and comfortable, somewaht frail Eyes: PERRL, normal lids, irises & conjunctiva ENT: grossly normal hearing, lips & tongue because membranes of her mouth are pink but somewhat dry Neck: no LAD, masses or thyromegaly Cardiovascular: RRR, no m/r/g. No LE edema. He'll pulses present and palpable  Respiratory: CTA bilaterally, no w/r/r. Normal respiratory effort. Abdomen: soft, ntnd nondistended sluggish bowel sounds no guarding or rebounding Skin: no rash or induration seen on limited exam Musculoskeletal: grossly normal tone BUE/BLE, bilateral lower extremity strength 5 out of 5 straight leg left bilaterally Psychiatric: grossly normal mood and affect, speech fluent and appropriate Neurologic: grossly non-focal. speech clear facial symmetry some  obvious short-term memory deficit           Labs on Admission:  Basic Metabolic Panel:  Recent Labs Lab 12/19/14 0728  NA 138  K 3.3*  CL 103  CO2 24  GLUCOSE 133*  BUN 8  CREATININE 0.77  CALCIUM 9.4   Liver Function Tests:  Recent Labs Lab 12/19/14 0728  AST 22  ALT 10*  ALKPHOS 88  BILITOT 0.5  PROT 6.9  ALBUMIN 3.6    Recent Labs Lab 12/19/14 0728  LIPASE 296*   No results for input(s): AMMONIA in the last 168 hours. CBC:  Recent Labs Lab 12/19/14 0728  WBC 9.1  HGB 13.2  HCT 41.1  MCV 89.5  PLT 189   Cardiac Enzymes:  Recent Labs Lab 12/19/14 0728  TROPONINI <0.03    BNP (last 3 results) No results for input(s): BNP in the last 8760 hours.  ProBNP (last 3 results) No results for input(s): PROBNP in the last 8760 hours.  CBG: No results for input(s): GLUCAP in the last 168 hours.  Radiological Exams on Admission: Dg Lumbar Spine 2-3 Views  12/17/2014  CLINICAL DATA:  Low back pain. EXAM: LUMBAR SPINE - 2-3 VIEW COMPARISON:  11/15/2011. FINDINGS: Thoracolumbar spine degenerative change and scoliosis. No acute abnormality identified. Diffuse osteopenia. 3.3 cm abdominal aortic aneurysm noted. Aortic ultrasound can be obtained for further evaluation. Sliding hiatal hernia. Prior cardiac valve replacement. IMPRESSION: 1. Diffuse osteopenia degenerative change with scoliosis concave right. 2. 3.3 cm abdominal aortic aneurysm. Aortic ultrasound can be obtained for further evaluation. 3. Sliding hiatal hernia. Electronically Signed   By: Marcello Moores  Register   On: 12/17/2014 14:56   Dg Pelvis 1-2 Views  12/17/2014  CLINICAL DATA:  Low back pain with bilateral sciatica for 1 week. No reported injury. EXAM: PELVIS - 1-2 VIEW COMPARISON:  April 17, 2014. FINDINGS: Status post right hemiarthroplasty. Prosthesis appears well situated. No fracture or dislocation is noted. Left hip appears normal. IMPRESSION: No acute abnormality seen. Electronically Signed    By: Marijo Conception, M.D.   On: 12/17/2014 14:56   Ct Angio Chest Aorta W/cm &/or Wo/cm  12/19/2014  CLINICAL DATA:  81 year old female with abdominal aortic aneurysm and flank pain EXAM: CT ANGIOGRAPHY CHEST, ABDOMEN AND PELVIS TECHNIQUE: Multidetector CT imaging through  the chest, abdomen and pelvis was performed using the standard protocol during bolus administration of intravenous contrast. Multiplanar reconstructed images and MIPs were obtained and reviewed to evaluate the vascular anatomy. CONTRAST:  100 mL Omni 350 COMPARISON:  None. FINDINGS: CTA CHEST FINDINGS Mediastinum: Unremarkable CT appearance of the thyroid gland for age. No suspicious mediastinal or hilar adenopathy. No soft tissue mediastinal mass. Large sliding hiatal hernia with nearly completely intra thoracic stomach. Heart/Vascular: Cardiomegaly. Patient is status post median sternotomy with evidence of multivessel CABG including LIMA to LAD bypass. No pericardial effusion. Surgical changes of prior mitral valve annuloplasty. No aortic dissection or aneurysm. Heterogeneous atherosclerotic plaque. Tortuous and elongated transverse and descending thoracic aorta. Four vessel arch. The left vertebral artery arises directly from the aorta. Lungs/Pleura: 6 mm ground-glass attenuation nodular opacity in the right lung apex (image 10 series 706). A 4 mm nodule in the periphery of the right upper lobe (image 21 series 706). There is a 3 mm nodule in the periphery of the left upper lobe on image 26. 5 mm nodule in the periphery of the left lower lobe on image 42. There are a few additional technique scattered 2 mm subpleural nodules. Whole dependent atelectasis in the medial aspect of both lower lobes likely secondary to the large hiatal hernia. Trace scarring versus is bandlike atelectasis in the inferior lingula. Lungs are otherwise clear. No focal consolidation. Bones/Soft Tissues: No acute fracture or aggressive appearing lytic or blastic  osseous lesion. Review of the MIP images confirms the above findings. CTA ABDOMEN AND PELVIS FINDINGS VASCULAR Aorta: Mild ectasia of the infrarenal abdominal aorta with a maximal diameter of 2.6 cm. Her scattered atherosclerotic vascular calcifications but no significant stenosis or evidence of dissection. Celiac: Widely patent. Conventional hepatic arterial anatomy. No visceral artery aneurysm. SMA: Atherosclerotic plaque in the mid portion without significant stenosis. No aneurysm. Renals: Single dominant renal arteries without evidence of significant stenosis, aneurysm or fibromuscular dysplasia. IMA: Patent and unremarkable. Inflow: Minimal atherosclerotic plaque without evidence of aneurysm or significant stenosis. Proximal Outflow: Middle atherosclerotic plaque without evidence of aneurysm or significant stenosis. Veins: No focal venous abnormality. NON-VASCULAR Abdomen: Intrathoracic stomach due to very large hiatal hernia. Unremarkable CT appearance of the spleen, adrenal glands and pancreas save for mild prominence of the main pancreatic duct. There is no associated atrophy or mass. Normal hepatic contour and morphology. There are at least 4 subcentimeter circumscribed low-attenuation lesions scattered throughout the liver which are too small to characterize but statistically highly likely benign cysts. The main portal veins are patent. Gallbladder is unremarkable. No intra or extrahepatic biliary ductal dilatation. Unremarkable appearance of the bilateral kidneys. No focal solid lesion, hydronephrosis or nephrolithiasis. Small probable simple cyst upper pole left kidney. Extensive colonic diverticulosis. No evidence of active inflammation. No evidence of bowel obstruction or focal bowel wall thickening. Moderate volume of formed stool in the rectum. Pelvis: Surgical changes of prior hysterectomy. The bladder is distended with urine. Streak artifact from right total hip arthroplasty slightly limits  evaluation. Bones/Soft Tissues: Stable chronic T7 compression fracture with approximately 60% height loss. Age indeterminate compression fracture of the inferior endplate of L1 with was 20% height loss. Multilevel degenerative disc disease. Generalized demineralization. Review of the MIP images confirms the above findings. IMPRESSION: CTA CHEST 1. No evidence of aortic aneurysm or dissection. 2. Massive hiatal hernia with nearly completely intra thoracic stomach. 3. Atherosclerosis and multivessel coronary artery calcifications. 4. Surgical changes of prior multivessel CABG happened mitral valve annuloplasty. 5.  Cardiomegaly. 6. Multiple bilateral pulmonary nodules measuring up to 5 mm. In the absence of a known primary malignancy these likely represent sequelae of prior infection/inflammation or a granulomatous process. Consider follow-up CT scan of the chest in 1 year to confirm stability. 7. Chronic T7 compression fracture. CTA ABD/PELVIS 1. Fusiform ectasia of the infrarenal abdominal aorta with maximum transverse to the 2.6 cm. Ectatic abdominal aorta at risk for aneurysm development. Recommend followup by ultrasound in 5 years. This recommendation follows ACR consensus guidelines: White Paper of the ACR Incidental Findings Committee II on Vascular Findings. J Am Coll Radiol 2013; 10:789-794. 2. Extensive colonic diverticulosis without evidence of active inflammation. 3. Moderate volume retained stool in the rectum suggests an element of constipation. 4. Age indeterminate compression fracture of the inferior endplate of L1. Signed, Criselda Peaches, MD [facility] Radiology Electronically Signed   By: Jacqulynn Cadet M.D.   On: 12/19/2014 11:34   Ct Angio Abd/pel W/ And/or W/o  12/19/2014  CLINICAL DATA:  81 year old female with abdominal aortic aneurysm and flank pain EXAM: CT ANGIOGRAPHY CHEST, ABDOMEN AND PELVIS TECHNIQUE: Multidetector CT imaging  through the chest, abdomen and pelvis was performed using the standard protocol during bolus administration of intravenous contrast. Multiplanar reconstructed images and MIPs were obtained and reviewed to evaluate the vascular anatomy. CONTRAST:  100 mL Omni 350 COMPARISON:  None. FINDINGS: CTA CHEST FINDINGS Mediastinum: Unremarkable CT appearance of the thyroid gland for age. No suspicious mediastinal or hilar adenopathy. No soft tissue mediastinal mass. Large sliding hiatal hernia with nearly completely intra thoracic stomach. Heart/Vascular: Cardiomegaly. Patient is status post median sternotomy with evidence of multivessel CABG including LIMA to LAD bypass. No pericardial effusion. Surgical changes of prior mitral valve annuloplasty. No aortic dissection or aneurysm. Heterogeneous atherosclerotic plaque. Tortuous and elongated transverse and descending thoracic aorta. Four vessel arch. The left vertebral artery arises directly from the aorta. Lungs/Pleura: 6 mm ground-glass attenuation nodular opacity in the right lung apex (image 10 series 706). A 4 mm nodule in the periphery of the right upper lobe (image 21 series 706). There is a 3 mm nodule in the periphery of the left upper lobe on image 26. 5 mm nodule in the periphery of the left lower lobe on image 42. There are a few additional technique scattered 2 mm subpleural nodules. Whole dependent atelectasis in the medial aspect of both lower lobes likely secondary to the large hiatal hernia. Trace scarring versus is bandlike atelectasis in the inferior lingula. Lungs are otherwise clear. No focal consolidation. Bones/Soft Tissues: No acute fracture or aggressive appearing lytic or blastic osseous lesion. Review of the MIP images confirms the above findings. CTA ABDOMEN AND PELVIS FINDINGS VASCULAR Aorta: Mild ectasia of the infrarenal abdominal aorta with a maximal diameter of 2.6 cm. Her scattered atherosclerotic vascular calcifications but no significant  stenosis or evidence of dissection. Celiac: Widely patent. Conventional hepatic arterial anatomy. No visceral artery aneurysm. SMA: Atherosclerotic plaque in the mid portion without significant stenosis. No aneurysm. Renals: Single dominant renal arteries without evidence of significant stenosis, aneurysm or fibromuscular dysplasia. IMA: Patent and unremarkable. Inflow: Minimal atherosclerotic plaque without evidence of aneurysm or significant stenosis. Proximal Outflow: Middle atherosclerotic plaque without evidence of aneurysm or significant stenosis. Veins: No focal venous abnormality. NON-VASCULAR Abdomen: Intrathoracic stomach due to very large hiatal hernia. Unremarkable CT appearance of the spleen, adrenal glands and pancreas save for mild prominence of the main pancreatic duct. There is no associated atrophy or  mass. Normal hepatic contour and morphology. There are at least 4 subcentimeter circumscribed low-attenuation lesions scattered throughout the liver which are too small to characterize but statistically highly likely benign cysts. The main portal veins are patent. Gallbladder is unremarkable. No intra or extrahepatic biliary ductal dilatation. Unremarkable appearance of the bilateral kidneys. No focal solid lesion, hydronephrosis or nephrolithiasis. Small probable simple cyst upper pole left kidney. Extensive colonic diverticulosis. No evidence of active inflammation. No evidence of bowel obstruction or focal bowel wall thickening. Moderate volume of formed stool in the rectum. Pelvis: Surgical changes of prior hysterectomy. The bladder is distended with urine. Streak artifact from right total hip arthroplasty slightly limits evaluation. Bones/Soft Tissues: Stable chronic T7 compression fracture with approximately 60% height loss. Age indeterminate compression fracture of the inferior endplate of L1 with was 20% height loss. Multilevel degenerative disc disease. Generalized demineralization. Review of  the MIP images confirms the above findings. IMPRESSION: CTA CHEST 1. No evidence of aortic aneurysm or dissection. 2. Massive hiatal hernia with nearly completely intra thoracic stomach. 3. Atherosclerosis and multivessel coronary artery calcifications. 4. Surgical changes of prior multivessel CABG happened mitral valve annuloplasty. 5. Cardiomegaly. 6. Multiple bilateral pulmonary nodules measuring up to 5 mm. In the absence of a known primary malignancy these likely represent sequelae of prior infection/inflammation or a granulomatous process. Consider follow-up CT scan of the chest in 1 year to confirm stability. 7. Chronic T7 compression fracture. CTA ABD/PELVIS 1. Fusiform ectasia of the infrarenal abdominal aorta with maximum transverse to the 2.6 cm. Ectatic abdominal aorta at risk for aneurysm development. Recommend followup by ultrasound in 5 years. This recommendation follows ACR consensus guidelines: White Paper of the ACR Incidental Findings Committee II on Vascular Findings. J Am Coll Radiol 2013; 10:789-794. 2. Extensive colonic diverticulosis without evidence of active inflammation. 3. Moderate volume retained stool in the rectum suggests an element of constipation. 4. Age indeterminate compression fracture of the inferior endplate of L1. Signed, Criselda Peaches, MD [facility] Radiology Electronically Signed   By: Jacqulynn Cadet M.D.   On: 12/19/2014 11:34    EKG: Independently reviewed. Sinus rhythm probable left atrial enlargement borderline right axis deviation  Assessment/Plan Principal Problem:   Bradycardia Active Problems:   Mitral regurgitation   Essential hypertension, benign   Coronary artery disease   Congestive heart failure (HCC)   HLD (hyperlipidemia)   Intractable low back pain   Pancreatitis   Compression fracture   Hypokalemia  1. Bradycardia. Etiology on clear. She does take Toprol at home but has not had her  medications this morning. -Admit to telemetry -Initial troponin negative we'll cycle denies chest pain -Obtain a TSH -Hold her home beta blocker -Request cardiology consult  #2 pancreatitis. Lipase 296, ET with mild prominence of main pancreatic duct -Denies abdominal pain, no nausea or vomiting, no EtOH use, no transaminitis -Give IV fluids -Clear liquids -Anti emetic as needed -Repeat lipase in the a.m.  #3. Intractable back pain. Likely related to compression fracture at L1 -Pain management -Physical therapy -Encourage mobilization  #4. Hypertension -Controlled in the emergency department -Hold home beta blocker -Continue ACE inhibitor  #5. Hypokalemia. Mild -Will replete -Check magnesium level -Recheck in the morning  #6. CAD. No chest pain. Initial troponin at baseline. History of ischemic heart disease status post CABG 2013 as well as mitral valve repair angioplasty 2013 -Denies chest pain -Monitor on telemetry -Cycle troponins -Beta blocker for now -Last echo last year with the  EF of 60%  #7. Diastolic heart failure -Appears euvolemic -We'll monitor intake and output obtain daily weights -Beta blocker for now secondary to #1 -EF last year 60%  #8. Aortic aneurysm. Incidental finding on imaging done outpatient. Today CT NGO negative for aortic aneurysm dissection -Patient instructed to schedule ultrasound but has not done this yet -Outpatient follow-up  #9. Pulmonary nodules per CT -Incidental findings -Outpatient follow-up -Former smoker   Code Status: full DVT Prophylaxis: Family Communication: husband at bedside Disposition Plan: home when ready  Time spent: 35 minutes  [facility]

## 2014-12-19 NOTE — ED Notes (Signed)
Pt and RN returned from CT with no signs of distress; tolerated CT scans well; no episodes of bradycardia in CT.

## 2014-12-19 NOTE — ED Notes (Signed)
Pt's monitor alarming SB. Pt's HR 28. Walked in to see pt. Pt heaving. Pt's HR increased in 10 seconds to 65 HR and pt started resting again.

## 2014-12-19 NOTE — ED Notes (Signed)
Assisted Matt, RN with placing patient on bedpan; visitor at bedside

## 2014-12-20 ENCOUNTER — Inpatient Hospital Stay (HOSPITAL_COMMUNITY): Payer: Medicare Other

## 2014-12-20 DIAGNOSIS — E43 Unspecified severe protein-calorie malnutrition: Secondary | ICD-10-CM | POA: Insufficient documentation

## 2014-12-20 DIAGNOSIS — S32000A Wedge compression fracture of unspecified lumbar vertebra, initial encounter for closed fracture: Secondary | ICD-10-CM | POA: Insufficient documentation

## 2014-12-20 LAB — APTT: APTT: 28 s (ref 24–37)

## 2014-12-20 LAB — BASIC METABOLIC PANEL
Anion gap: 5 (ref 5–15)
BUN: 6 mg/dL (ref 6–20)
CALCIUM: 9 mg/dL (ref 8.9–10.3)
CO2: 26 mmol/L (ref 22–32)
CREATININE: 0.63 mg/dL (ref 0.44–1.00)
Chloride: 106 mmol/L (ref 101–111)
GFR calc Af Amer: >60 mL/min (ref >60)
GLUCOSE: 97 mg/dL (ref 65–99)
Potassium: 4.4 mmol/L (ref 3.5–5.1)
SODIUM: 137 mmol/L (ref 135–145)

## 2014-12-20 LAB — CBC
HEMATOCRIT: 36.5 % (ref 36.0–46.0)
Hemoglobin: 12 g/dL (ref 12.0–15.0)
MCH: 29.6 pg (ref 26.0–34.0)
MCHC: 32.9 g/dL (ref 30.0–36.0)
MCV: 89.9 fL (ref 78.0–100.0)
PLATELETS: 189 10*3/uL (ref 150–400)
RBC: 4.06 MIL/uL (ref 3.87–5.11)
RDW: 14 % (ref 11.5–15.5)
WBC: 10.1 10*3/uL (ref 4.0–10.5)

## 2014-12-20 LAB — PROTIME-INR
INR: 1.1 (ref 0.00–1.49)
PROTHROMBIN TIME: 14.4 s (ref 11.6–15.2)

## 2014-12-20 LAB — HEPATIC FUNCTION PANEL
ALT: 8 U/L — AB (ref 14–54)
AST: 20 U/L (ref 15–41)
Albumin: 3.1 g/dL — ABNORMAL LOW (ref 3.5–5.0)
Alkaline Phosphatase: 73 U/L (ref 38–126)
TOTAL PROTEIN: 5.9 g/dL — AB (ref 6.5–8.1)
Total Bilirubin: 0.4 mg/dL (ref 0.3–1.2)

## 2014-12-20 LAB — LIPASE, BLOOD: LIPASE: 41 U/L (ref 11–51)

## 2014-12-20 LAB — TROPONIN I

## 2014-12-20 MED ORDER — ENSURE ENLIVE PO LIQD
237.0000 mL | Freq: Two times a day (BID) | ORAL | Status: DC
  Administered 2014-12-20 – 2014-12-25 (×5): 237 mL via ORAL

## 2014-12-20 MED ORDER — GADOBENATE DIMEGLUMINE 529 MG/ML IV SOLN
10.0000 mL | Freq: Once | INTRAVENOUS | Status: AC | PRN
  Administered 2014-12-20: 10 mL via INTRAVENOUS

## 2014-12-20 MED ORDER — METHYLPREDNISOLONE SODIUM SUCC 125 MG IJ SOLR
60.0000 mg | Freq: Every day | INTRAMUSCULAR | Status: DC
  Administered 2014-12-20 – 2014-12-21 (×2): 60 mg via INTRAVENOUS
  Filled 2014-12-20 (×2): qty 2

## 2014-12-20 MED ORDER — METHOCARBAMOL 1000 MG/10ML IJ SOLN
500.0000 mg | Freq: Three times a day (TID) | INTRAVENOUS | Status: DC | PRN
  Filled 2014-12-20: qty 5

## 2014-12-20 NOTE — Progress Notes (Signed)
Orthopedic Tech Progress Note Patient Details:  Victoria Sexton 1927-09-22 [id number]  Patient ID: Victoria Sexton, female   DOB: [birth year], 81 y.o.   MRN: [id number] Called in bio-tech brace order; spoke with Dolores Lory, Donato Studley 12/20/2014, 11:17 AM

## 2014-12-20 NOTE — Progress Notes (Signed)
SUBJECTIVE:  No lightheadedness.  No syncope.  Still has back pain.  OBJECTIVE:   Vitals:   Filed Vitals:   12/19/14 1757 12/19/14 2118 12/20/14 0406 12/20/14 1418  BP: 147/60 130/55 147/68 135/65  Pulse:  75 67 68  Temp:  97.7 F (36.5 C) 98.2 F (36.8 C) 98 F (36.7 C)  TempSrc:  Oral Oral Oral  Resp:  18 18 17   Height:      Weight:      SpO2:  98% 97% 96%   I&O's:   Intake/Output Summary (Last 24 hours) at 12/20/14 1430 Last data filed at 12/20/14 1418  Gross per 24 hour  Intake   1700 ml  Output    650 ml  Net   1050 ml   TELEMETRY: Reviewed telemetry pt in NSR, PVCs:     PHYSICAL EXAM General: Well developed, well nourished, in no acute distress Head:   Normal cephalic and atramatic  Lungs:   Clear bilaterally to auscultation. Heart:  HRRR S1 S2  No JVD, soft systolic murmur.   Abdomen: abdomen soft and non-tender Msk:  Back normal,  Normal strength and tone for age. Extremities:   No edema.   Neuro: Alert and oriented. Psych:  Normal affect, responds appropriately Skin: No rash   LABS: Basic Metabolic Panel:  Recent Labs  12/19/14 0728 12/19/14 1934 12/20/14 0348  NA 138  --  137  K 3.3*  --  4.4  CL 103  --  106  CO2 24  --  26  GLUCOSE 133*  --  97  BUN 8  --  6  CREATININE 0.77  --  0.63  CALCIUM 9.4  --  9.0  MG 2.0 2.1  --   PHOS  --  3.9  --    Liver Function Tests:  Recent Labs  12/19/14 0728 12/20/14 0930  AST 22 20  ALT 10* 8*  ALKPHOS 88 73  BILITOT 0.5 0.4  PROT 6.9 5.9*  ALBUMIN 3.6 3.1*    Recent Labs  12/19/14 0728 12/20/14 0348  LIPASE 296* 41   CBC:  Recent Labs  12/19/14 0728 12/20/14 0348  WBC 9.1 10.1  HGB 13.2 12.0  HCT 41.1 36.5  MCV 89.5 89.9  PLT 189 189   Cardiac Enzymes:  Recent Labs  12/19/14 0728 12/19/14 1934 12/20/14 0348  TROPONINI <0.03 <0.03 <0.03   BNP: Invalid input(s): POCBNP D-Dimer: No results for input(s): DDIMER in the last 72 hours. Hemoglobin A1C: No results  for input(s): HGBA1C in the last 72 hours. Fasting Lipid Panel: No results for input(s): CHOL, HDL, LDLCALC, TRIG, CHOLHDL, LDLDIRECT in the last 72 hours. Thyroid Function Tests:  Recent Labs  12/19/14 1934  TSH 1.014   Anemia Panel: No results for input(s): VITAMINB12, FOLATE, FERRITIN, TIBC, IRON, RETICCTPCT in the last 72 hours. Coag Panel:   Lab Results  Component Value Date   INR 1.00 04/16/2014   INR 2.21* 10/19/2011   INR 2.06* 10/18/2011    RADIOLOGY: Dg Lumbar Spine 2-3 Views  12/17/2014  CLINICAL DATA:  Low back pain. EXAM: LUMBAR SPINE - 2-3 VIEW COMPARISON:  11/15/2011. FINDINGS: Thoracolumbar spine degenerative change and scoliosis. No acute abnormality identified. Diffuse osteopenia. 3.3 cm abdominal aortic aneurysm noted. Aortic ultrasound can be obtained for further evaluation. Sliding hiatal hernia. Prior cardiac valve replacement. IMPRESSION: 1. Diffuse osteopenia degenerative change with scoliosis concave right. 2. 3.3 cm abdominal aortic aneurysm. Aortic ultrasound can be obtained for further evaluation. 3. Sliding  hiatal hernia. Electronically Signed   By: Marcello Moores  Register   On: 12/17/2014 14:56   Dg Pelvis 1-2 Views  12/17/2014  CLINICAL DATA:  Low back pain with bilateral sciatica for 1 week. No reported injury. EXAM: PELVIS - 1-2 VIEW COMPARISON:  April 17, 2014. FINDINGS: Status post right hemiarthroplasty. Prosthesis appears well situated. No fracture or dislocation is noted. Left hip appears normal. IMPRESSION: No acute abnormality seen. Electronically Signed   By: Marijo Conception, M.D.   On: 12/17/2014 14:56   Mr Lumbar Spine W Wo Contrast  12/20/2014  CLINICAL DATA:  Patient fell in parking lot. Pain radiates to RIGHT hip. EXAM: MRI LUMBAR SPINE WITHOUT AND WITH CONTRAST TECHNIQUE: Multiplanar and multiecho pulse sequences of the lumbar spine were obtained without and with intravenous contrast. CONTRAST:  69mL MULTIHANCE GADOBENATE DIMEGLUMINE 529 MG/ML IV  SOLN COMPARISON:  Lumbar spine radiograph 12/17/2014. CTA chest and abdomen 12/19/2014. FINDINGS: Segmentation: Normal. Alignment:  Normal except for trace retrolisthesis L1-2. Vertebrae: There is an acute compression fracture of L1, primarily inferior endplate deformity, but also rightward anterior inferior chip fractures, with edema confined to the lower 1/2 of the vertebral body, only minor wedging, and prevertebral soft tissue swelling. As demonstrated to greater detail on CT axial coronal images, the lateral inferior aspect of the vertebral body which is associated with osseous ridging, has fractured on its RIGHT side, and is displaced laterally. Tiny corner fracture anteriorly superiorly at L2, minor compared to the edema throughout L1. No significant retropulsion or epidural hematoma.No other worrisome osseous lesions. Conus medullaris: Normal in size, and signal. Trace low termination. Paraspinal tissues: No evidence for hydronephrosis. Renal cystic disease, incompletely evaluated. Abdominal aortic ectasia up to 28 mm diameter. Ectatic abdominal aorta at risk for aneurysm development. Recommend followup by ultrasound in 5 years. This recommendation follows ACR consensus guidelines: White Paper of the ACR Incidental Findings Committee II on Vascular Findings. J Am Coll Radiol 2013; 10:789-794. Disc levels: L1-L2: There is mild annular bulging/subligamentous protrusion, and disc osteophyte complex, eccentric to the RIGHT. No definite subarticular zone narrowing or epidural hematoma but asymmetric RIGHT-sided foraminal narrowing likely irritates the RIGHT L1 nerve root. In addition the RIGHT L1 nerve root is likely displace in its extraforaminal course due to fractures of the RIGHT lateral inferior aspect of that vertebral body. See coronal CTA image 98 series 702, and axial CTA images 141-145 series 701. Pedicles are intact. L2-L3:  Tiny annular rent.  No impingement. L3-L4:  Unremarkable. L4-L5:   Unremarkable. L5-S1:  Unremarkable. Compared with plain films, the fracture is difficult to see due to osteopenia IMPRESSION: Acute compression fracture of L1, consistent with a posttraumatic osteopenic (nonpathologic) injury, with bone marrow edema primarily confined to the inferior 1/2 of the vertebral body. Inferior endplate deformity, with anterior chip fracture. Anterior prevertebral hematoma. The patient's RIGHT hip pain may relate to asymmetric foraminal narrowing at L1-2 on the RIGHT due to disc osteophyte complex as well as displacement of the extraforaminal nerve alongside the injured lateral inferior aspect of L1. See discussion above. Tiny anterior superior corner injury to L2, without wedging. The patient may be a candidate for vertebral augmentation if conservative measures do not result in improvement. Electronically Signed   By: Staci Righter M.D.   On: 12/20/2014 13:38   Ct Angio Chest Aorta W/cm &/or Wo/cm  12/19/2014  CLINICAL DATA:  81 year old female with abdominal aortic aneurysm and flank pain EXAM: CT ANGIOGRAPHY CHEST, ABDOMEN AND PELVIS TECHNIQUE: Multidetector CT imaging through  the chest, abdomen and pelvis was performed using the standard protocol during bolus administration of intravenous contrast. Multiplanar reconstructed images and MIPs were obtained and reviewed to evaluate the vascular anatomy. CONTRAST:  100 mL Omni 350 COMPARISON:  None. FINDINGS: CTA CHEST FINDINGS Mediastinum: Unremarkable CT appearance of the thyroid gland for age. No suspicious mediastinal or hilar adenopathy. No soft tissue mediastinal mass. Large sliding hiatal hernia with nearly completely intra thoracic stomach. Heart/Vascular: Cardiomegaly. Patient is status post median sternotomy with evidence of multivessel CABG including LIMA to LAD bypass. No pericardial effusion. Surgical changes of prior mitral valve annuloplasty. No aortic dissection or aneurysm. Heterogeneous atherosclerotic plaque. Tortuous  and elongated transverse and descending thoracic aorta. Four vessel arch. The left vertebral artery arises directly from the aorta. Lungs/Pleura: 6 mm ground-glass attenuation nodular opacity in the right lung apex (image 10 series 706). A 4 mm nodule in the periphery of the right upper lobe (image 21 series 706). There is a 3 mm nodule in the periphery of the left upper lobe on image 26. 5 mm nodule in the periphery of the left lower lobe on image 42. There are a few additional technique scattered 2 mm subpleural nodules. Whole dependent atelectasis in the medial aspect of both lower lobes likely secondary to the large hiatal hernia. Trace scarring versus is bandlike atelectasis in the inferior lingula. Lungs are otherwise clear. No focal consolidation. Bones/Soft Tissues: No acute fracture or aggressive appearing lytic or blastic osseous lesion. Review of the MIP images confirms the above findings. CTA ABDOMEN AND PELVIS FINDINGS VASCULAR Aorta: Mild ectasia of the infrarenal abdominal aorta with a maximal diameter of 2.6 cm. Her scattered atherosclerotic vascular calcifications but no significant stenosis or evidence of dissection. Celiac: Widely patent. Conventional hepatic arterial anatomy. No visceral artery aneurysm. SMA: Atherosclerotic plaque in the mid portion without significant stenosis. No aneurysm. Renals: Single dominant renal arteries without evidence of significant stenosis, aneurysm or fibromuscular dysplasia. IMA: Patent and unremarkable. Inflow: Minimal atherosclerotic plaque without evidence of aneurysm or significant stenosis. Proximal Outflow: Middle atherosclerotic plaque without evidence of aneurysm or significant stenosis. Veins: No focal venous abnormality. NON-VASCULAR Abdomen: Intrathoracic stomach due to very large hiatal hernia. Unremarkable CT appearance of the spleen, adrenal glands and pancreas save for mild prominence of the main pancreatic duct. There is no associated atrophy or  mass. Normal hepatic contour and morphology. There are at least 4 subcentimeter circumscribed low-attenuation lesions scattered throughout the liver which are too small to characterize but statistically highly likely benign cysts. The main portal veins are patent. Gallbladder is unremarkable. No intra or extrahepatic biliary ductal dilatation. Unremarkable appearance of the bilateral kidneys. No focal solid lesion, hydronephrosis or nephrolithiasis. Small probable simple cyst upper pole left kidney. Extensive colonic diverticulosis. No evidence of active inflammation. No evidence of bowel obstruction or focal bowel wall thickening. Moderate volume of formed stool in the rectum. Pelvis: Surgical changes of prior hysterectomy. The bladder is distended with urine. Streak artifact from right total hip arthroplasty slightly limits evaluation. Bones/Soft Tissues: Stable chronic T7 compression fracture with approximately 60% height loss. Age indeterminate compression fracture of the inferior endplate of L1 with was 20% height loss. Multilevel degenerative disc disease. Generalized demineralization. Review of the MIP images confirms the above findings. IMPRESSION: CTA CHEST 1. No evidence of aortic aneurysm or dissection. 2. Massive hiatal hernia with nearly completely intra thoracic stomach. 3. Atherosclerosis and multivessel coronary artery calcifications. 4. Surgical changes of prior multivessel CABG happened mitral valve annuloplasty. 5.  Cardiomegaly. 6. Multiple bilateral pulmonary nodules measuring up to 5 mm. In the absence of a known primary malignancy these likely represent sequelae of prior infection/inflammation or a granulomatous process. Consider follow-up CT scan of the chest in 1 year to confirm stability. 7. Chronic T7 compression fracture. CTA ABD/PELVIS 1. Fusiform ectasia of the infrarenal abdominal aorta with maximum transverse to the 2.6 cm. Ectatic abdominal aorta at risk for aneurysm development.  Recommend followup by ultrasound in 5 years. This recommendation follows ACR consensus guidelines: White Paper of the ACR Incidental Findings Committee II on Vascular Findings. J Am Coll Radiol 2013; 10:789-794. 2. Extensive colonic diverticulosis without evidence of active inflammation. 3. Moderate volume retained stool in the rectum suggests an element of constipation. 4. Age indeterminate compression fracture of the inferior endplate of L1. Signed, Criselda Peaches, MD [facility] Electronically Signed   By: Jacqulynn Cadet M.D.   On: 12/19/2014 11:34   Ct Angio Abd/pel W/ And/or W/o  12/19/2014  CLINICAL DATA:  81 year old female with abdominal aortic aneurysm and flank pain EXAM: CT ANGIOGRAPHY CHEST, ABDOMEN AND PELVIS TECHNIQUE: Multidetector CT imaging through the chest, abdomen and pelvis was performed using the standard protocol during bolus administration of intravenous contrast. Multiplanar reconstructed images and MIPs were obtained and reviewed to evaluate the vascular anatomy. CONTRAST:  100 mL Omni 350 COMPARISON:  None. FINDINGS: CTA CHEST FINDINGS Mediastinum: Unremarkable CT appearance of the thyroid gland for age. No suspicious mediastinal or hilar adenopathy. No soft tissue mediastinal mass. Large sliding hiatal hernia with nearly completely intra thoracic stomach. Heart/Vascular: Cardiomegaly. Patient is status post median sternotomy with evidence of multivessel CABG including LIMA to LAD bypass. No pericardial effusion. Surgical changes of prior mitral valve annuloplasty. No aortic dissection or aneurysm. Heterogeneous atherosclerotic plaque. Tortuous and elongated transverse and descending thoracic aorta. Four vessel arch. The left vertebral artery arises directly from the aorta. Lungs/Pleura: 6 mm ground-glass attenuation nodular opacity in the right lung apex (image 10 series 706). A 4 mm nodule in the periphery of the  right upper lobe (image 21 series 706). There is a 3 mm nodule in the periphery of the left upper lobe on image 26. 5 mm nodule in the periphery of the left lower lobe on image 42. There are a few additional technique scattered 2 mm subpleural nodules. Whole dependent atelectasis in the medial aspect of both lower lobes likely secondary to the large hiatal hernia. Trace scarring versus is bandlike atelectasis in the inferior lingula. Lungs are otherwise clear. No focal consolidation. Bones/Soft Tissues: No acute fracture or aggressive appearing lytic or blastic osseous lesion. Review of the MIP images confirms the above findings. CTA ABDOMEN AND PELVIS FINDINGS VASCULAR Aorta: Mild ectasia of the infrarenal abdominal aorta with a maximal diameter of 2.6 cm. Her scattered atherosclerotic vascular calcifications but no significant stenosis or evidence of dissection. Celiac: Widely patent. Conventional hepatic arterial anatomy. No visceral artery aneurysm. SMA: Atherosclerotic plaque in the mid portion without significant stenosis. No aneurysm. Renals: Single dominant renal arteries without evidence of significant stenosis, aneurysm or fibromuscular dysplasia. IMA: Patent and unremarkable. Inflow: Minimal atherosclerotic plaque without evidence of aneurysm or significant stenosis. Proximal Outflow: Middle atherosclerotic plaque without evidence of aneurysm or significant stenosis. Veins: No focal venous abnormality. NON-VASCULAR Abdomen: Intrathoracic stomach due to very large hiatal hernia. Unremarkable CT appearance of the spleen, adrenal glands and pancreas save for mild prominence of the main pancreatic duct. There is no associated atrophy or  mass. Normal hepatic contour and morphology. There are at least 4 subcentimeter circumscribed low-attenuation lesions scattered throughout the liver which are too small to characterize but statistically highly likely benign cysts. The main portal veins are patent. Gallbladder  is unremarkable. No intra or extrahepatic biliary ductal dilatation. Unremarkable appearance of the bilateral kidneys. No focal solid lesion, hydronephrosis or nephrolithiasis. Small probable simple cyst upper pole left kidney. Extensive colonic diverticulosis. No evidence of active inflammation. No evidence of bowel obstruction or focal bowel wall thickening. Moderate volume of formed stool in the rectum. Pelvis: Surgical changes of prior hysterectomy. The bladder is distended with urine. Streak artifact from right total hip arthroplasty slightly limits evaluation. Bones/Soft Tissues: Stable chronic T7 compression fracture with approximately 60% height loss. Age indeterminate compression fracture of the inferior endplate of L1 with was 20% height loss. Multilevel degenerative disc disease. Generalized demineralization. Review of the MIP images confirms the above findings. IMPRESSION: CTA CHEST 1. No evidence of aortic aneurysm or dissection. 2. Massive hiatal hernia with nearly completely intra thoracic stomach. 3. Atherosclerosis and multivessel coronary artery calcifications. 4. Surgical changes of prior multivessel CABG happened mitral valve annuloplasty. 5. Cardiomegaly. 6. Multiple bilateral pulmonary nodules measuring up to 5 mm. In the absence of a known primary malignancy these likely represent sequelae of prior infection/inflammation or a granulomatous process. Consider follow-up CT scan of the chest in 1 year to confirm stability. 7. Chronic T7 compression fracture. CTA ABD/PELVIS 1. Fusiform ectasia of the infrarenal abdominal aorta with maximum transverse to the 2.6 cm. Ectatic abdominal aorta at risk for aneurysm development. Recommend followup by ultrasound in 5 years. This recommendation follows ACR consensus guidelines: White Paper of the ACR Incidental Findings Committee II on Vascular Findings. J Am Coll Radiol 2013; 10:789-794. 2. Extensive colonic diverticulosis without evidence of active  inflammation. 3. Moderate volume retained stool in the rectum suggests an element of constipation. 4. Age indeterminate compression fracture of the inferior endplate of L1. Signed, Criselda Peaches, MD [facility] Radiology Electronically Signed   By: Jacqulynn Cadet M.D.   On: 12/19/2014 11:34      ASSESSMENT: PLAN:  Bradycardia: resolved over the last 24 hours off of Toprol .  I would continue to hold beta blocker.  No indication for pacer at this time.  By tomorrow, toprol should be completley out of her system.  Jettie Booze, MD  12/20/2014  2:30 PM

## 2014-12-20 NOTE — Consult Note (Signed)
Chief Complaint: Patient was seen in consultation today for possible L1 vertebroplasty/kyphoplasty Chief Complaint  Patient presents with  . Abdominal Pain    Referring Physician(s): TRH  History of Present Illness: Victoria Sexton is a 81 y.o. female with past medical history significant for hypertension, mitral valve prolapse, osteoporosis, coronary artery disease, congestive heart failure, prior mitral valve repair in 2013, prior CABG thousand 2013, aortic aneurysm, prior right leg fracture repair, and prior right hip arthroplasty in April 2016. Patient was recently admitted with back and hip pain. She states that her back has been bothering her since April of this year, but has worsened over the past week after putting up Christmas decorations at home. She has had no recent fall. She currently denies radiation of pain to lower extremities, associated paresthesias or bowel/bladder dysfunction. MRI of the lumbar spine performed on 12/16 revealed an acute compression fracture at L1 in addition to some asymmetric foraminal narrowing at L1 -2 on the right due to disc osteophyte complex as well as displacement of the extraforaminal nerve alongside the injured lateral inferior aspect of L1. There was also a tiny anterior superior corner injury to L2, without wedging. Request has now been made for L1 vertebroplasty/kyphoplasty. She is currently on Solu-Medrol, Robaxin, Tylenol and Dilaudid but continues to complain of mid to lower back pain and hip pain.  Past Medical History  Diagnosis Date  . Hypertension   . MVP (mitral valve prolapse)   . Osteoporosis   . Inguinal hernia right  . Mitral regurgitation due to cusp prolapse   . Cardiac tumor, ventricular     LVOT  . PVC (premature ventricular contraction)   . Hiatal hernia   . Coronary artery disease 10/04/2011  . Congestive heart failure ([place]) 10/04/2011  . Heart murmur   . Shortness of breath   . Anginal pain ([place])   . Hernia, hiatal       Left wears a hernia belt  . Skin cancer 2013    Face.  . S/P mitral valve repair 10/12/2011    Complex valvuloplasty including triangular resection of flail posterior leaflet with artificial Goretex neocord placement x2 and 26 mm Sorin Memo 3D ring annuloplasty  . S/P CABG x 3 10/12/2011    LIMA to LAD, SVG to OM, SVG to RCA, EVH via left thigh and leg  . Thoracic compression fracture ([place])   . Hx of CABG     -LIMA to LAD,SVG to RCA, SVG to OM, mitral valve repair, patent foramen ovale closure, benign-apearing mass removal.  . Aortic atherosclerosis ([place]) 10/25/2012    Upper aortic atherosclerosis seen on CT scan 08/13/2002  . Mild malnutrition ([place]) 10/25/2012  . Aortic aneurysm [facility])     Past Surgical History  Procedure Laterality Date  . Abdominal hysterectomy    . Leg fx repair      Right  . Hemorroidectomy    . Tee without cardioversion  11/13/2010    Procedure: TRANSESOPHAGEAL ECHOCARDIOGRAM (TEE);  Surgeon: Candee Furbish, MD;  Location: [facility] ENDOSCOPY;  Service: Cardiovascular;  Laterality: N/A;  . Skin cancer excision  2013    not melanoma  . Hemorrhoid surgery    . Mitral valve repair  10/12/2011    Procedure: MITRAL VALVE REPAIR (MVR);  Surgeon: Rexene Alberts, MD;  Location: [place];  Service: Open Heart Surgery;  Laterality: N/A;  MITRAL VALVE REPAIR  . Coronary artery bypass graft  10/12/2011    Procedure: CORONARY ARTERY BYPASS GRAFTING (  CABG);  Surgeon: Rexene Alberts, MD;  Location: [place];  Service: Open Heart Surgery;  Laterality: N/A;  RESECTION OF LV MASS  . Patent foramen ovale closure  10/12/2011    Procedure: PATENT FORAMEN OVALE CLOSURE;  Surgeon: Rexene Alberts, MD;  Location: [place];  Service: Open Heart Surgery;  Laterality: N/A;  . Left and right heart catheterization with coronary angiogram N/A 09/29/2011    Procedure: LEFT AND RIGHT HEART CATHETERIZATION WITH CORONARY ANGIOGRAM;  Surgeon: Candee Furbish, MD;  Location: [facility] CATH LAB;  Service: Cardiovascular;   Laterality: N/A;  . Hip arthroplasty Right 04/17/2014    Procedure: ARTHROPLASTY BIPOLAR HIP;  Surgeon: Paralee Cancel, MD;  Location: [facility] ORS;  Service: Orthopedics;  Laterality: Right;    Allergies: Hydrocodone-acetaminophen; Penicillins; Cyclobenzaprine; and Sulfa antibiotics  Medications: Prior to Admission medications   Medication Sig Start Date End Date Taking? Authorizing Provider  aspirin 81 MG tablet Take 81 mg by mouth daily.   Yes Historical Provider, MD  Cholecalciferol (VITAMIN D3) 2000 UNITS TABS Take 2,000 Units by mouth daily.    Yes Historical Provider, MD  Coenzyme Q10 (CO Q-10) 50 MG CAPS Take 50 mg by mouth daily.   Yes Historical Provider, MD  ENSURE (ENSURE) Take 1 Can by mouth 3 (three) times daily between meals.   Yes Historical Provider, MD  lisinopril (PRINIVIL,ZESTRIL) 5 MG tablet TAKE 1 TABLET ONCE DAILY. 09/17/14  Yes Jerline Pain, MD  metoprolol succinate (TOPROL-XL) 100 MG 24 hr tablet TAKE 1 TABLET ONCE DAILY. 12/02/14  Yes Jerline Pain, MD  multivitamin-lutein [facility]) CAPS Take 1 capsule by mouth daily. 6MG    Yes Historical Provider, MD  OVER THE COUNTER MEDICATION Take 1 tablet by mouth daily. Omega Red   Yes Historical Provider, MD  rivastigmine (EXELON) 9.5 mg/24hr Place 9.5 mg onto the skin daily.   Yes Historical Provider, MD  simvastatin (ZOCOR) 5 MG tablet TAKE 1 TABLET ONCE DAILY. 10/14/14  Yes Jerline Pain, MD  vitamin E 400 UNIT capsule Take 400 Units by mouth daily.   Yes Historical Provider, MD  metoprolol succinate (TOPROL-XL) 50 MG 24 hr tablet Take 1 tablet (50 mg total) by mouth 2 (two) times daily. Take with or immediately following a meal. Patient not taking: Reported on 12/19/2014 12/29/13   Barton Dubois, MD     Family History  Problem Relation Age of Onset  . Heart attack Mother   . Lung cancer Father     Social History   Social History  . Marital Status: Married    Spouse Name: Ed  . Number of Children: 0  . Years of  Education: 12   Occupational History  . retired     Science writer   Social History Main Topics  . Smoking status: Former Smoker -- 0.50 packs/day for 30 years    Types: Cigarettes    Quit date: 11/13/2010  . Smokeless tobacco: Never Used  . Alcohol Use: No  . Drug Use: No  . Sexual Activity: Not Asked     Comment: did not ask   Other Topics Concern  . None   Social History Narrative   Lives at home with husband   Caffeine use - coffee 4-5 cups decaf daily      Review of Systems patient currently denies fever, headache, chest pain, cough, abdominal pain, nausea, vomiting or abnormal bleeding. She does have some dyspnea with exertion as well as back and hip pain has discussed above.  Vital  Signs: BP 135/65 mmHg  Pulse 68  Temp(Src) 98 F (36.7 C) (Oral)  Resp 17  Ht 5\' 5"  (1.651 m)  Wt 95 lb 11.2 oz (43.409 kg)  BMI 15.93 kg/m2  SpO2 96%  Physical Exam patient is awake and alert. Exacerbation of back and hip pain is noted with movement. Chest is clear to auscultation bilaterally. Heart with regular rate and rhythm, positive murmur. Abdomen soft, positive bowel sounds, nontender. Some mild to moderate paravertebral tenderness noted with palpation at L1 level. Patient also complains of right greater than left hip pain. Extremities with intact sensory/motor function. No lower extremity edema.  Mallampati Score:     Imaging: Dg Lumbar Spine 2-3 Views  12/17/2014  CLINICAL DATA:  Low back pain. EXAM: LUMBAR SPINE - 2-3 VIEW COMPARISON:  11/15/2011. FINDINGS: Thoracolumbar spine degenerative change and scoliosis. No acute abnormality identified. Diffuse osteopenia. 3.3 cm abdominal aortic aneurysm noted. Aortic ultrasound can be obtained for further evaluation. Sliding hiatal hernia. Prior cardiac valve replacement. IMPRESSION: 1. Diffuse osteopenia degenerative change with scoliosis concave right. 2. 3.3 cm abdominal aortic aneurysm. Aortic ultrasound can be obtained for further  evaluation. 3. Sliding hiatal hernia. Electronically Signed   By: Marcello Moores  Register   On: 12/17/2014 14:56   Dg Pelvis 1-2 Views  12/17/2014  CLINICAL DATA:  Low back pain with bilateral sciatica for 1 week. No reported injury. EXAM: PELVIS - 1-2 VIEW COMPARISON:  April 17, 2014. FINDINGS: Status post right hemiarthroplasty. Prosthesis appears well situated. No fracture or dislocation is noted. Left hip appears normal. IMPRESSION: No acute abnormality seen. Electronically Signed   By: Marijo Conception, M.D.   On: 12/17/2014 14:56   Mr Lumbar Spine W Wo Contrast  12/20/2014  CLINICAL DATA:  Patient fell in parking lot. Pain radiates to RIGHT hip. EXAM: MRI LUMBAR SPINE WITHOUT AND WITH CONTRAST TECHNIQUE: Multiplanar and multiecho pulse sequences of the lumbar spine were obtained without and with intravenous contrast. CONTRAST:  62mL MULTIHANCE GADOBENATE DIMEGLUMINE 529 MG/ML IV SOLN COMPARISON:  Lumbar spine radiograph 12/17/2014. CTA chest and abdomen 12/19/2014. FINDINGS: Segmentation: Normal. Alignment:  Normal except for trace retrolisthesis L1-2. Vertebrae: There is an acute compression fracture of L1, primarily inferior endplate deformity, but also rightward anterior inferior chip fractures, with edema confined to the lower 1/2 of the vertebral body, only minor wedging, and prevertebral soft tissue swelling. As demonstrated to greater detail on CT axial coronal images, the lateral inferior aspect of the vertebral body which is associated with osseous ridging, has fractured on its RIGHT side, and is displaced laterally. Tiny corner fracture anteriorly superiorly at L2, minor compared to the edema throughout L1. No significant retropulsion or epidural hematoma.No other worrisome osseous lesions. Conus medullaris: Normal in size, and signal. Trace low termination. Paraspinal tissues: No evidence for hydronephrosis. Renal cystic disease, incompletely evaluated. Abdominal aortic ectasia up to 28 mm diameter.  Ectatic abdominal aorta at risk for aneurysm development. Recommend followup by ultrasound in 5 years. This recommendation follows ACR consensus guidelines: White Paper of the ACR Incidental Findings Committee II on Vascular Findings. J Am Coll Radiol 2013; 10:789-794. Disc levels: L1-L2: There is mild annular bulging/subligamentous protrusion, and disc osteophyte complex, eccentric to the RIGHT. No definite subarticular zone narrowing or epidural hematoma but asymmetric RIGHT-sided foraminal narrowing likely irritates the RIGHT L1 nerve root. In addition the RIGHT L1 nerve root is likely displace in its extraforaminal course due to fractures of the RIGHT lateral inferior aspect of that vertebral body. See coronal  CTA image 98 series 702, and axial CTA images 141-145 series 701. Pedicles are intact. L2-L3:  Tiny annular rent.  No impingement. L3-L4:  Unremarkable. L4-L5:  Unremarkable. L5-S1:  Unremarkable. Compared with plain films, the fracture is difficult to see due to osteopenia IMPRESSION: Acute compression fracture of L1, consistent with a posttraumatic osteopenic (nonpathologic) injury, with bone marrow edema primarily confined to the inferior 1/2 of the vertebral body. Inferior endplate deformity, with anterior chip fracture. Anterior prevertebral hematoma. The patient's RIGHT hip pain may relate to asymmetric foraminal narrowing at L1-2 on the RIGHT due to disc osteophyte complex as well as displacement of the extraforaminal nerve alongside the injured lateral inferior aspect of L1. See discussion above. Tiny anterior superior corner injury to L2, without wedging. The patient may be a candidate for vertebral augmentation if conservative measures do not result in improvement. Electronically Signed   By: Staci Righter M.D.   On: 12/20/2014 13:38   Ct Angio Chest Aorta W/cm &/or Wo/cm  12/19/2014  CLINICAL DATA:  81 year old female with abdominal aortic aneurysm and flank pain EXAM: CT ANGIOGRAPHY CHEST,  ABDOMEN AND PELVIS TECHNIQUE: Multidetector CT imaging through the chest, abdomen and pelvis was performed using the standard protocol during bolus administration of intravenous contrast. Multiplanar reconstructed images and MIPs were obtained and reviewed to evaluate the vascular anatomy. CONTRAST:  100 mL Omni 350 COMPARISON:  None. FINDINGS: CTA CHEST FINDINGS Mediastinum: Unremarkable CT appearance of the thyroid gland for age. No suspicious mediastinal or hilar adenopathy. No soft tissue mediastinal mass. Large sliding hiatal hernia with nearly completely intra thoracic stomach. Heart/Vascular: Cardiomegaly. Patient is status post median sternotomy with evidence of multivessel CABG including LIMA to LAD bypass. No pericardial effusion. Surgical changes of prior mitral valve annuloplasty. No aortic dissection or aneurysm. Heterogeneous atherosclerotic plaque. Tortuous and elongated transverse and descending thoracic aorta. Four vessel arch. The left vertebral artery arises directly from the aorta. Lungs/Pleura: 6 mm ground-glass attenuation nodular opacity in the right lung apex (image 10 series 706). A 4 mm nodule in the periphery of the right upper lobe (image 21 series 706). There is a 3 mm nodule in the periphery of the left upper lobe on image 26. 5 mm nodule in the periphery of the left lower lobe on image 42. There are a few additional technique scattered 2 mm subpleural nodules. Whole dependent atelectasis in the medial aspect of both lower lobes likely secondary to the large hiatal hernia. Trace scarring versus is bandlike atelectasis in the inferior lingula. Lungs are otherwise clear. No focal consolidation. Bones/Soft Tissues: No acute fracture or aggressive appearing lytic or blastic osseous lesion. Review of the MIP images confirms the above findings. CTA ABDOMEN AND PELVIS FINDINGS VASCULAR Aorta: Mild ectasia of the infrarenal abdominal aorta with a maximal diameter of 2.6 cm. Her scattered  atherosclerotic vascular calcifications but no significant stenosis or evidence of dissection. Celiac: Widely patent. Conventional hepatic arterial anatomy. No visceral artery aneurysm. SMA: Atherosclerotic plaque in the mid portion without significant stenosis. No aneurysm. Renals: Single dominant renal arteries without evidence of significant stenosis, aneurysm or fibromuscular dysplasia. IMA: Patent and unremarkable. Inflow: Minimal atherosclerotic plaque without evidence of aneurysm or significant stenosis. Proximal Outflow: Middle atherosclerotic plaque without evidence of aneurysm or significant stenosis. Veins: No focal venous abnormality. NON-VASCULAR Abdomen: Intrathoracic stomach due to very large hiatal hernia. Unremarkable CT appearance of the spleen, adrenal glands and pancreas save for mild prominence of the main pancreatic duct. There is no associated atrophy or  mass. Normal hepatic contour and morphology. There are at least 4 subcentimeter circumscribed low-attenuation lesions scattered throughout the liver which are too small to characterize but statistically highly likely benign cysts. The main portal veins are patent. Gallbladder is unremarkable. No intra or extrahepatic biliary ductal dilatation. Unremarkable appearance of the bilateral kidneys. No focal solid lesion, hydronephrosis or nephrolithiasis. Small probable simple cyst upper pole left kidney. Extensive colonic diverticulosis. No evidence of active inflammation. No evidence of bowel obstruction or focal bowel wall thickening. Moderate volume of formed stool in the rectum. Pelvis: Surgical changes of prior hysterectomy. The bladder is distended with urine. Streak artifact from right total hip arthroplasty slightly limits evaluation. Bones/Soft Tissues: Stable chronic T7 compression fracture with approximately 60% height loss. Age indeterminate compression fracture of the inferior endplate of L1 with was 20% height loss. Multilevel  degenerative disc disease. Generalized demineralization. Review of the MIP images confirms the above findings. IMPRESSION: CTA CHEST 1. No evidence of aortic aneurysm or dissection. 2. Massive hiatal hernia with nearly completely intra thoracic stomach. 3. Atherosclerosis and multivessel coronary artery calcifications. 4. Surgical changes of prior multivessel CABG happened mitral valve annuloplasty. 5. Cardiomegaly. 6. Multiple bilateral pulmonary nodules measuring up to 5 mm. In the absence of a known primary malignancy these likely represent sequelae of prior infection/inflammation or a granulomatous process. Consider follow-up CT scan of the chest in 1 year to confirm stability. 7. Chronic T7 compression fracture. CTA ABD/PELVIS 1. Fusiform ectasia of the infrarenal abdominal aorta with maximum transverse to the 2.6 cm. Ectatic abdominal aorta at risk for aneurysm development. Recommend followup by ultrasound in 5 years. This recommendation follows ACR consensus guidelines: White Paper of the ACR Incidental Findings Committee II on Vascular Findings. J Am Coll Radiol 2013; 10:789-794. 2. Extensive colonic diverticulosis without evidence of active inflammation. 3. Moderate volume retained stool in the rectum suggests an element of constipation. 4. Age indeterminate compression fracture of the inferior endplate of L1. Signed, Criselda Peaches, MD [facility] Radiology Electronically Signed   By: Jacqulynn Cadet M.D.   On: 12/19/2014 11:34   Ct Angio Abd/pel W/ And/or W/o  12/19/2014  CLINICAL DATA:  81 year old female with abdominal aortic aneurysm and flank pain EXAM: CT ANGIOGRAPHY CHEST, ABDOMEN AND PELVIS TECHNIQUE: Multidetector CT imaging through the chest, abdomen and pelvis was performed using the standard protocol during bolus administration of intravenous contrast. Multiplanar reconstructed images and MIPs were obtained and reviewed to evaluate the  vascular anatomy. CONTRAST:  100 mL Omni 350 COMPARISON:  None. FINDINGS: CTA CHEST FINDINGS Mediastinum: Unremarkable CT appearance of the thyroid gland for age. No suspicious mediastinal or hilar adenopathy. No soft tissue mediastinal mass. Large sliding hiatal hernia with nearly completely intra thoracic stomach. Heart/Vascular: Cardiomegaly. Patient is status post median sternotomy with evidence of multivessel CABG including LIMA to LAD bypass. No pericardial effusion. Surgical changes of prior mitral valve annuloplasty. No aortic dissection or aneurysm. Heterogeneous atherosclerotic plaque. Tortuous and elongated transverse and descending thoracic aorta. Four vessel arch. The left vertebral artery arises directly from the aorta. Lungs/Pleura: 6 mm ground-glass attenuation nodular opacity in the right lung apex (image 10 series 706). A 4 mm nodule in the periphery of the right upper lobe (image 21 series 706). There is a 3 mm nodule in the periphery of the left upper lobe on image 26. 5 mm nodule in the periphery of the left lower lobe on image 42. There are a few additional technique scattered 2  mm subpleural nodules. Whole dependent atelectasis in the medial aspect of both lower lobes likely secondary to the large hiatal hernia. Trace scarring versus is bandlike atelectasis in the inferior lingula. Lungs are otherwise clear. No focal consolidation. Bones/Soft Tissues: No acute fracture or aggressive appearing lytic or blastic osseous lesion. Review of the MIP images confirms the above findings. CTA ABDOMEN AND PELVIS FINDINGS VASCULAR Aorta: Mild ectasia of the infrarenal abdominal aorta with a maximal diameter of 2.6 cm. Her scattered atherosclerotic vascular calcifications but no significant stenosis or evidence of dissection. Celiac: Widely patent. Conventional hepatic arterial anatomy. No visceral artery aneurysm. SMA: Atherosclerotic plaque in the mid portion without significant stenosis. No aneurysm.  Renals: Single dominant renal arteries without evidence of significant stenosis, aneurysm or fibromuscular dysplasia. IMA: Patent and unremarkable. Inflow: Minimal atherosclerotic plaque without evidence of aneurysm or significant stenosis. Proximal Outflow: Middle atherosclerotic plaque without evidence of aneurysm or significant stenosis. Veins: No focal venous abnormality. NON-VASCULAR Abdomen: Intrathoracic stomach due to very large hiatal hernia. Unremarkable CT appearance of the spleen, adrenal glands and pancreas save for mild prominence of the main pancreatic duct. There is no associated atrophy or mass. Normal hepatic contour and morphology. There are at least 4 subcentimeter circumscribed low-attenuation lesions scattered throughout the liver which are too small to characterize but statistically highly likely benign cysts. The main portal veins are patent. Gallbladder is unremarkable. No intra or extrahepatic biliary ductal dilatation. Unremarkable appearance of the bilateral kidneys. No focal solid lesion, hydronephrosis or nephrolithiasis. Small probable simple cyst upper pole left kidney. Extensive colonic diverticulosis. No evidence of active inflammation. No evidence of bowel obstruction or focal bowel wall thickening. Moderate volume of formed stool in the rectum. Pelvis: Surgical changes of prior hysterectomy. The bladder is distended with urine. Streak artifact from right total hip arthroplasty slightly limits evaluation. Bones/Soft Tissues: Stable chronic T7 compression fracture with approximately 60% height loss. Age indeterminate compression fracture of the inferior endplate of L1 with was 20% height loss. Multilevel degenerative disc disease. Generalized demineralization. Review of the MIP images confirms the above findings. IMPRESSION: CTA CHEST 1. No evidence of aortic aneurysm or dissection. 2. Massive hiatal hernia with nearly completely intra thoracic stomach. 3. Atherosclerosis and  multivessel coronary artery calcifications. 4. Surgical changes of prior multivessel CABG happened mitral valve annuloplasty. 5. Cardiomegaly. 6. Multiple bilateral pulmonary nodules measuring up to 5 mm. In the absence of a known primary malignancy these likely represent sequelae of prior infection/inflammation or a granulomatous process. Consider follow-up CT scan of the chest in 1 year to confirm stability. 7. Chronic T7 compression fracture. CTA ABD/PELVIS 1. Fusiform ectasia of the infrarenal abdominal aorta with maximum transverse to the 2.6 cm. Ectatic abdominal aorta at risk for aneurysm development. Recommend followup by ultrasound in 5 years. This recommendation follows ACR consensus guidelines: White Paper of the ACR Incidental Findings Committee II on Vascular Findings. J Am Coll Radiol 2013; 10:789-794. 2. Extensive colonic diverticulosis without evidence of active inflammation. 3. Moderate volume retained stool in the rectum suggests an element of constipation. 4. Age indeterminate compression fracture of the inferior endplate of L1. Signed, Criselda Peaches, MD [facility] Radiology Electronically Signed   By: Jacqulynn Cadet M.D.   On: 12/19/2014 11:34    Labs:  CBC:  Recent Labs  04/18/14 0439 04/19/14 0420 12/19/14 0728 12/20/14 0348  WBC 8.4 9.6 9.1 10.1  HGB 10.0* 9.4* 13.2 12.0  HCT 31.0* 29.8* 41.1 36.5  PLT 122*  144* 189 189    COAGS:  Recent Labs  04/16/14 1920 12/20/14 1420  INR 1.00 1.10  APTT  --  28    BMP:  Recent Labs  04/19/14 0420 04/20/14 0515 12/19/14 0728 12/20/14 0348  NA 136 136 138 137  K 5.1 4.1 3.3* 4.4  CL 104 105 103 106  CO2 26 26 24 26   GLUCOSE 104* 102* 133* 97  BUN 17 14 8 6   CALCIUM 8.9 8.2* 9.4 9.0  CREATININE 0.45* 0.49* 0.77 0.63  GFRNONAA 89* 87* >60 >60  GFRAA >90 >90 >60 >60    LIVER FUNCTION TESTS:  Recent Labs  12/29/13 0041 12/19/14 0728 12/20/14 0930    BILITOT 0.4 0.5 0.4  AST 28 22 20   ALT 13 10* 8*  ALKPHOS 70 88 73  PROT 7.6 6.9 5.9*  ALBUMIN 4.2 3.6 3.1*    TUMOR MARKERS: No results for input(s): AFPTM, CEA, CA199, CHROMGRNA in the last 8760 hours.  Assessment and Plan: Patient with history of symptomatic osteoporotic acute L1 compression fracture; request received for L1/vertebroplasty/kyphoplasty. Imaging studies have been reviewed by Dr. Barbie Banner and patient appears to be a good candidate for the above procedure. Patient currently afebrile; current labs include creatinine 0.63, potassium 4.4, WBC 10.1, hemoglobin 12.0, platelets 189K , Protime 14.4, INR 1.1 . Details/risks of vertebroplasty/kyphoplasty were discussed with patient and husband, including but not limited to, internal bleeding, infection, nerve injury, cement migration, inability to eradicate back pain. At this time pt/husband wish to consider matter further before consenting to procedure. In the meantime, we will contact patient's insurance company for preauthorization. Will continue to monitor. At this point earliest procedure can be done is next week.   Thank you for this interesting consult.  I greatly enjoyed meeting Victoria Sexton and look forward to participating in their care.  A copy of this report was sent to the requesting provider on this date.  Signed: D. Rowe Robert 12/20/2014, 4:06 PM   I spent a total of   20 minutes in face to face in clinical consultation, greater than 50% of which was counseling/coordinating care for possible L1 vertebroplasty/kyphoplasty

## 2014-12-20 NOTE — Progress Notes (Signed)
Initial Nutrition Assessment  DOCUMENTATION CODES:   Severe malnutrition in context of chronic illness, Underweight  INTERVENTION:   Ensure Enlive po BID, each supplement provides 350 kcal and 20 grams of protein  NUTRITION DIAGNOSIS:   Malnutrition related to chronic illness as evidenced by estimated needs   GOAL:   Patient will meet greater than or equal to 90% of their needs  MONITOR:   PO intake, Supplement acceptance, Labs, Weight trends, I & O's  REASON FOR ASSESSMENT:   Consult Assessment of nutrition requirement/status  ASSESSMENT:   81 y.o. Female with PMH that includes diastolic heart failure, CAD, ischemic heart disease status post CABG 2013, history of mitral valve repair and angioplasty since emergency department with one-week history of intractable back pain. Initial evaluation in the emergency department reveals L1 compression fracture age-indeterminate, symptomatic bradycardia, pancreatitis.  RD spoke with patient and patient's husband.  Husband reports pt has no appetite is not eating.  PO intake 10-50% per flowsheet records.  Pt amenable to chocolate Ensure Enlive oral nutrition supplements.  RD to order.  Nutrition-Focused physical exam completed. Findings are severe fat depletion, severe muscle depletion, and no edema.   Diet Order:  Diet Heart Room service appropriate?: Yes; Fluid consistency:: Thin  Skin:  Reviewed, no issues  Last BM:  12/14  Height:   Ht Readings from Last 1 Encounters:  12/19/14 5\' 5"  (1.651 m)    Weight:   Wt Readings from Last 1 Encounters:  12/19/14 95 lb 11.2 oz (43.409 kg)    Ideal Body Weight:  56.8 kg  BMI:  Body mass index is 15.93 kg/(m^2).  Estimated Nutritional Needs:   Kcal:  1200-1400  Protein:  50-60 gm  Fluid:  >/= 1.5 L  EDUCATION NEEDS:   No education needs identified at this time  Arthur Holms, RD, LDN Pager #: [phone] After-Hours Pager #: [phone]

## 2014-12-20 NOTE — Progress Notes (Signed)
Orthopedic Tech Progress Note Patient Details:  Victoria Sexton 1927-10-27 [id number] Brace order completed by bio-tech vendor. Patient ID: Victoria Sexton, female   DOB: [birth year], 81 y.o.   MRN: [id number]   Braulio Bosch 12/20/2014, 3:48 PM

## 2014-12-20 NOTE — Progress Notes (Addendum)
[facility] PROGRESS NOTE  TRAE YURCHAK [id number] DOB: 01/07/1927 DOA: 12/19/2014 PCP: Vidal Schwalbe, MD  Length of stay: 1   Assessment/Plan: Principal Problem:   Bradycardia Active Problems:   Mitral regurgitation   Essential hypertension, benign   Coronary artery disease   Congestive heart failure (HCC)   HLD (hyperlipidemia)   Intractable low back pain   Pancreatitis   Compression fracture   Hypokalemia   Lung nodule   Aortic aneurysm (HCC)   Assessment and plan 1. Bradycardia.  Marland Kitchentransient w/ junctional escape beats  She does take Toprol at home but has not had her medications this morning. Bradycardia improved, cardiology has seen the patient and have recommended to hold metoprolol for 48 hours before resuming, more likely related to nausea, no indication for pacemaker at this time Cardiac enzymes negative Normal TSH    #2 probable pancreatitis. Lipase 296, now 41, no clinical signs of acute pancreatitis CT with mild prominence of main pancreatic duct -Denies abdominal pain, no nausea or vomiting, no EtOH use, no transaminitis Advance diet as tolerated   #3. Intractable back pain. Likely related to compression fracture at L1 TLSO brace, MRI of the lumbar spine,IR consultation for probable vertebroplasty Start Robaxin and IV steroids for better pain control Ambulate with PT when pain is better controlled  Vitamin D levels  #4. Hypertension -Controlled in the emergency department -Hold home beta blocker -Continue ACE inhibitor  #5. Hypokalemia.Resolved  #6. CAD. No chest pain. Initial troponin at baseline. History of ischemic heart disease status post CABG 2013 as well as mitral valve repair angioplasty 2013 Stable, cardiology following -Last echo last year with the EF of 60%  #7.  chronic Diastolic heart failureWithout exacerbation -Appears euvolemic -We'll monitor intake and output obtain daily weights -Beta blocker for now secondary  to #1 -EF last year 60%  #8. Aortic aneurysm. Incidental finding on imaging done outpatient. Today CT   negative for aortic aneurysm dissection -Patient instructed to schedule ultrasound but has not done this yet -Outpatient follow-up   #9. Pulmonary nodules per CT -Incidental findings Will need outpatient follow-up   DVT prophylaxsis Lovenox  Code Status:      Code Status Orders        Start     Ordered   12/19/14 1811  Full code   Continuous     12/19/14 1811      Family Communication: Discussed in detail with the patient, all imaging results, lab results explained to the patient   Disposition Plan:  Interventional radiology consultation, MRI of the back     Brief narrative: aye W Balay is a pleasant 81 y.o. female with a past medical history that includes diastolic heart failure, CAD, ischemic heart disease status post CABG 2013, history of mitral valve repair and angioplasty since emergency department with one-week history of intractable back pain. Initial evaluation in the emergency department reveals L1 compression fracture age-indeterminate, symptomatic bradycardia, pancreatitis.  She reports chronic back pain since her fall in April however the last week lower back pain worsening. He denies any fall or muscle strain. He reports the pain is constant sharp located intermittent low back radiating to both hips. She denies any numbness or tingling of her lower extremities. She reports being "steady on my feet" not needing a walker or cane she reports no falls since April and her husband who is at the bedside confirms. She was seen by PCP 2 days ago and had outpatient imaging  revealed no bony abnormalities. While in the emergency department she got up to the bedside commode heart rate dropped to 40 she became slightly diaphoretic and complained of dizziness and nausea". A similar episode while lying in the bed heart rate went to 42. She reports no chest pain palpitations fever  chills recent sick contacts. She reports no abdominal pain nausea vomiting diarrhea. She reports a tendency towards constipation last bowel movement 2 days ago. Reports decreased oral intake denies unintentional weight loss  In the emergency department she is afebrile with a blood pressure 145/59. Heart rate range is 40 -67. She is not hypoxic   Consultants:  Cardiology  Procedures:  None  Antibiotics: Anti-infectives    None         HPI/Subjective: Husband by the bedside, complaining of inability to sit or ambulate because of back pain  Objective: Filed Vitals:   12/19/14 1553 12/19/14 1757 12/19/14 2118 12/20/14 0406  BP: 162/68 147/60 130/55 147/68  Pulse: 66  75 67  Temp: 98 F (36.7 C)  97.7 F (36.5 C) 98.2 F (36.8 C)  TempSrc: Oral  Oral Oral  Resp: 18  18 18   Height: 5\' 5"  (1.651 m)     Weight: 43.409 kg (95 lb 11.2 oz)     SpO2: 99%  98% 97%    Intake/Output Summary (Last 24 hours) at 12/20/14 1055 Last data filed at 12/20/14 0706  Gross per 24 hour  Intake   1580 ml  Output    450 ml  Net   1130 ml    Exam:  General: Very uncomfortable because of low back pain Lungs: Clear to auscultation bilaterally without wheezes or crackles Cardiovascular: Regular rate and rhythm without murmur gallop or rub normal S1 and S2 Abdomen: Nontender, nondistended, soft, bowel sounds positive, no rebound, no ascites, no appreciable mass Extremities: No significant cyanosis, clubbing, or edema bilateral lower extremities, increased paraspinal muscle spasm     Data Review   Micro Results No results found for this or any previous visit (from the past 240 hour(s)).  Radiology Reports Dg Lumbar Spine 2-3 Views  12/17/2014  CLINICAL DATA:  Low back pain. EXAM: LUMBAR SPINE - 2-3 VIEW COMPARISON:  11/15/2011. FINDINGS: Thoracolumbar spine degenerative change and scoliosis. No acute abnormality identified. Diffuse osteopenia. 3.3 cm abdominal aortic aneurysm noted.  Aortic ultrasound can be obtained for further evaluation. Sliding hiatal hernia. Prior cardiac valve replacement. IMPRESSION: 1. Diffuse osteopenia degenerative change with scoliosis concave right. 2. 3.3 cm abdominal aortic aneurysm. Aortic ultrasound can be obtained for further evaluation. 3. Sliding hiatal hernia. Electronically Signed   By: Marcello Moores  Register   On: 12/17/2014 14:56   Dg Pelvis 1-2 Views  12/17/2014  CLINICAL DATA:  Low back pain with bilateral sciatica for 1 week. No reported injury. EXAM: PELVIS - 1-2 VIEW COMPARISON:  April 17, 2014. FINDINGS: Status post right hemiarthroplasty. Prosthesis appears well situated. No fracture or dislocation is noted. Left hip appears normal. IMPRESSION: No acute abnormality seen. Electronically Signed   By: Marijo Conception, M.D.   On: 12/17/2014 14:56   Ct Angio Chest Aorta W/cm &/or Wo/cm  12/19/2014  CLINICAL DATA:  81 year old female with abdominal aortic aneurysm and flank pain EXAM: CT ANGIOGRAPHY CHEST, ABDOMEN AND PELVIS TECHNIQUE: Multidetector CT imaging through the chest, abdomen and pelvis was performed using the standard protocol during bolus administration of intravenous contrast. Multiplanar reconstructed images and MIPs were obtained and reviewed to evaluate the vascular anatomy. CONTRAST:  100  mL Omni 350 COMPARISON:  None. FINDINGS: CTA CHEST FINDINGS Mediastinum: Unremarkable CT appearance of the thyroid gland for age. No suspicious mediastinal or hilar adenopathy. No soft tissue mediastinal mass. Large sliding hiatal hernia with nearly completely intra thoracic stomach. Heart/Vascular: Cardiomegaly. Patient is status post median sternotomy with evidence of multivessel CABG including LIMA to LAD bypass. No pericardial effusion. Surgical changes of prior mitral valve annuloplasty. No aortic dissection or aneurysm. Heterogeneous atherosclerotic plaque. Tortuous and elongated transverse and descending thoracic aorta. Four vessel arch. The  left vertebral artery arises directly from the aorta. Lungs/Pleura: 6 mm ground-glass attenuation nodular opacity in the right lung apex (image 10 series 706). A 4 mm nodule in the periphery of the right upper lobe (image 21 series 706). There is a 3 mm nodule in the periphery of the left upper lobe on image 26. 5 mm nodule in the periphery of the left lower lobe on image 42. There are a few additional technique scattered 2 mm subpleural nodules. Whole dependent atelectasis in the medial aspect of both lower lobes likely secondary to the large hiatal hernia. Trace scarring versus is bandlike atelectasis in the inferior lingula. Lungs are otherwise clear. No focal consolidation. Bones/Soft Tissues: No acute fracture or aggressive appearing lytic or blastic osseous lesion. Review of the MIP images confirms the above findings. CTA ABDOMEN AND PELVIS FINDINGS VASCULAR Aorta: Mild ectasia of the infrarenal abdominal aorta with a maximal diameter of 2.6 cm. Her scattered atherosclerotic vascular calcifications but no significant stenosis or evidence of dissection. Celiac: Widely patent. Conventional hepatic arterial anatomy. No visceral artery aneurysm. SMA: Atherosclerotic plaque in the mid portion without significant stenosis. No aneurysm. Renals: Single dominant renal arteries without evidence of significant stenosis, aneurysm or fibromuscular dysplasia. IMA: Patent and unremarkable. Inflow: Minimal atherosclerotic plaque without evidence of aneurysm or significant stenosis. Proximal Outflow: Middle atherosclerotic plaque without evidence of aneurysm or significant stenosis. Veins: No focal venous abnormality. NON-VASCULAR Abdomen: Intrathoracic stomach due to very large hiatal hernia. Unremarkable CT appearance of the spleen, adrenal glands and pancreas save for mild prominence of the main pancreatic duct. There is no associated atrophy or mass. Normal hepatic contour and morphology. There are at least 4 subcentimeter  circumscribed low-attenuation lesions scattered throughout the liver which are too small to characterize but statistically highly likely benign cysts. The main portal veins are patent. Gallbladder is unremarkable. No intra or extrahepatic biliary ductal dilatation. Unremarkable appearance of the bilateral kidneys. No focal solid lesion, hydronephrosis or nephrolithiasis. Small probable simple cyst upper pole left kidney. Extensive colonic diverticulosis. No evidence of active inflammation. No evidence of bowel obstruction or focal bowel wall thickening. Moderate volume of formed stool in the rectum. Pelvis: Surgical changes of prior hysterectomy. The bladder is distended with urine. Streak artifact from right total hip arthroplasty slightly limits evaluation. Bones/Soft Tissues: Stable chronic T7 compression fracture with approximately 60% height loss. Age indeterminate compression fracture of the inferior endplate of L1 with was 20% height loss. Multilevel degenerative disc disease. Generalized demineralization. Review of the MIP images confirms the above findings. IMPRESSION: CTA CHEST 1. No evidence of aortic aneurysm or dissection. 2. Massive hiatal hernia with nearly completely intra thoracic stomach. 3. Atherosclerosis and multivessel coronary artery calcifications. 4. Surgical changes of prior multivessel CABG happened mitral valve annuloplasty. 5. Cardiomegaly. 6. Multiple bilateral pulmonary nodules measuring up to 5 mm. In the absence of a known primary malignancy these likely represent sequelae of prior infection/inflammation or a granulomatous process. Consider follow-up CT scan  of the chest in 1 year to confirm stability. 7. Chronic T7 compression fracture. CTA ABD/PELVIS 1. Fusiform ectasia of the infrarenal abdominal aorta with maximum transverse to the 2.6 cm. Ectatic abdominal aorta at risk for aneurysm development. Recommend followup by ultrasound in 5 years. This recommendation follows ACR  consensus guidelines: White Paper of the ACR Incidental Findings Committee II on Vascular Findings. J Am Coll Radiol 2013; 10:789-794. 2. Extensive colonic diverticulosis without evidence of active inflammation. 3. Moderate volume retained stool in the rectum suggests an element of constipation. 4. Age indeterminate compression fracture of the inferior endplate of L1. Signed, Criselda Peaches, MD [facility] Radiology Electronically Signed   By: Jacqulynn Cadet M.D.   On: 12/19/2014 11:34   Ct Angio Abd/pel W/ And/or W/o  12/19/2014  CLINICAL DATA:  81 year old female with abdominal aortic aneurysm and flank pain EXAM: CT ANGIOGRAPHY CHEST, ABDOMEN AND PELVIS TECHNIQUE: Multidetector CT imaging through the chest, abdomen and pelvis was performed using the standard protocol during bolus administration of intravenous contrast. Multiplanar reconstructed images and MIPs were obtained and reviewed to evaluate the vascular anatomy. CONTRAST:  100 mL Omni 350 COMPARISON:  None. FINDINGS: CTA CHEST FINDINGS Mediastinum: Unremarkable CT appearance of the thyroid gland for age. No suspicious mediastinal or hilar adenopathy. No soft tissue mediastinal mass. Large sliding hiatal hernia with nearly completely intra thoracic stomach. Heart/Vascular: Cardiomegaly. Patient is status post median sternotomy with evidence of multivessel CABG including LIMA to LAD bypass. No pericardial effusion. Surgical changes of prior mitral valve annuloplasty. No aortic dissection or aneurysm. Heterogeneous atherosclerotic plaque. Tortuous and elongated transverse and descending thoracic aorta. Four vessel arch. The left vertebral artery arises directly from the aorta. Lungs/Pleura: 6 mm ground-glass attenuation nodular opacity in the right lung apex (image 10 series 706). A 4 mm nodule in the periphery of the right upper lobe (image 21 series 706). There is a 3 mm nodule in the periphery  of the left upper lobe on image 26. 5 mm nodule in the periphery of the left lower lobe on image 42. There are a few additional technique scattered 2 mm subpleural nodules. Whole dependent atelectasis in the medial aspect of both lower lobes likely secondary to the large hiatal hernia. Trace scarring versus is bandlike atelectasis in the inferior lingula. Lungs are otherwise clear. No focal consolidation. Bones/Soft Tissues: No acute fracture or aggressive appearing lytic or blastic osseous lesion. Review of the MIP images confirms the above findings. CTA ABDOMEN AND PELVIS FINDINGS VASCULAR Aorta: Mild ectasia of the infrarenal abdominal aorta with a maximal diameter of 2.6 cm. Her scattered atherosclerotic vascular calcifications but no significant stenosis or evidence of dissection. Celiac: Widely patent. Conventional hepatic arterial anatomy. No visceral artery aneurysm. SMA: Atherosclerotic plaque in the mid portion without significant stenosis. No aneurysm. Renals: Single dominant renal arteries without evidence of significant stenosis, aneurysm or fibromuscular dysplasia. IMA: Patent and unremarkable. Inflow: Minimal atherosclerotic plaque without evidence of aneurysm or significant stenosis. Proximal Outflow: Middle atherosclerotic plaque without evidence of aneurysm or significant stenosis. Veins: No focal venous abnormality. NON-VASCULAR Abdomen: Intrathoracic stomach due to very large hiatal hernia. Unremarkable CT appearance of the spleen, adrenal glands and pancreas save for mild prominence of the main pancreatic duct. There is no associated atrophy or mass. Normal hepatic contour and morphology. There are at least 4 subcentimeter circumscribed low-attenuation lesions scattered throughout the liver which are too small to characterize but statistically highly likely benign cysts. The main portal  veins are patent. Gallbladder is unremarkable. No intra or extrahepatic biliary ductal dilatation.  Unremarkable appearance of the bilateral kidneys. No focal solid lesion, hydronephrosis or nephrolithiasis. Small probable simple cyst upper pole left kidney. Extensive colonic diverticulosis. No evidence of active inflammation. No evidence of bowel obstruction or focal bowel wall thickening. Moderate volume of formed stool in the rectum. Pelvis: Surgical changes of prior hysterectomy. The bladder is distended with urine. Streak artifact from right total hip arthroplasty slightly limits evaluation. Bones/Soft Tissues: Stable chronic T7 compression fracture with approximately 60% height loss. Age indeterminate compression fracture of the inferior endplate of L1 with was 20% height loss. Multilevel degenerative disc disease. Generalized demineralization. Review of the MIP images confirms the above findings. IMPRESSION: CTA CHEST 1. No evidence of aortic aneurysm or dissection. 2. Massive hiatal hernia with nearly completely intra thoracic stomach. 3. Atherosclerosis and multivessel coronary artery calcifications. 4. Surgical changes of prior multivessel CABG happened mitral valve annuloplasty. 5. Cardiomegaly. 6. Multiple bilateral pulmonary nodules measuring up to 5 mm. In the absence of a known primary malignancy these likely represent sequelae of prior infection/inflammation or a granulomatous process. Consider follow-up CT scan of the chest in 1 year to confirm stability. 7. Chronic T7 compression fracture. CTA ABD/PELVIS 1. Fusiform ectasia of the infrarenal abdominal aorta with maximum transverse to the 2.6 cm. Ectatic abdominal aorta at risk for aneurysm development. Recommend followup by ultrasound in 5 years. This recommendation follows ACR consensus guidelines: White Paper of the ACR Incidental Findings Committee II on Vascular Findings. J Am Coll Radiol 2013; 10:789-794. 2. Extensive colonic diverticulosis without evidence of active inflammation. 3. Moderate volume retained stool in the rectum suggests an  element of constipation. 4. Age indeterminate compression fracture of the inferior endplate of L1. Signed, Criselda Peaches, MD [facility] Radiology Electronically Signed   By: Jacqulynn Cadet M.D.   On: 12/19/2014 11:34     CBC  Recent Labs Lab 12/19/14 0728 12/20/14 0348  WBC 9.1 10.1  HGB 13.2 12.0  HCT 41.1 36.5  PLT 189 189  MCV 89.5 89.9  MCH 28.8 29.6  MCHC 32.1 32.9  RDW 13.7 14.0    Chemistries   Recent Labs Lab 12/19/14 0728 12/19/14 1934 12/20/14 0348 12/20/14 0930  NA 138  --  137  --   K 3.3*  --  4.4  --   CL 103  --  106  --   CO2 24  --  26  --   GLUCOSE 133*  --  97  --   BUN 8  --  6  --   CREATININE 0.77  --  0.63  --   CALCIUM 9.4  --  9.0  --   MG 2.0 2.1  --   --   AST 22  --   --  20  ALT 10*  --   --  8*  ALKPHOS 88  --   --  73  BILITOT 0.5  --   --  0.4   ------------------------------------------------------------------------------------------------------------------ estimated creatinine clearance is 35.2 mL/min (by C-G formula based on Cr of 0.63). ------------------------------------------------------------------------------------------------------------------ No results for input(s): HGBA1C in the last 72 hours. ------------------------------------------------------------------------------------------------------------------ No results for input(s): CHOL, HDL, LDLCALC, TRIG, CHOLHDL, LDLDIRECT in the last 72 hours. ------------------------------------------------------------------------------------------------------------------  Recent Labs  12/19/14 1934  TSH 1.014   ------------------------------------------------------------------------------------------------------------------ No results for input(s): VITAMINB12, FOLATE, FERRITIN, TIBC, IRON, RETICCTPCT in the last 72 hours.  Coagulation profile No results for input(s):  INR, PROTIME in the last 168 hours.  No results  for input(s): DDIMER in the last 72 hours.  Cardiac Enzymes  Recent Labs Lab 12/19/14 0728 12/19/14 1934 12/20/14 0348  TROPONINI <0.03 <0.03 <0.03   ------------------------------------------------------------------------------------------------------------------ Invalid input(s): POCBNP   CBG: No results for input(s): GLUCAP in the last 168 hours.     Studies: Ct Angio Chest Aorta W/cm &/or Wo/cm  12/19/2014  CLINICAL DATA:  81 year old female with abdominal aortic aneurysm and flank pain EXAM: CT ANGIOGRAPHY CHEST, ABDOMEN AND PELVIS TECHNIQUE: Multidetector CT imaging through the chest, abdomen and pelvis was performed using the standard protocol during bolus administration of intravenous contrast. Multiplanar reconstructed images and MIPs were obtained and reviewed to evaluate the vascular anatomy. CONTRAST:  100 mL Omni 350 COMPARISON:  None. FINDINGS: CTA CHEST FINDINGS Mediastinum: Unremarkable CT appearance of the thyroid gland for age. No suspicious mediastinal or hilar adenopathy. No soft tissue mediastinal mass. Large sliding hiatal hernia with nearly completely intra thoracic stomach. Heart/Vascular: Cardiomegaly. Patient is status post median sternotomy with evidence of multivessel CABG including LIMA to LAD bypass. No pericardial effusion. Surgical changes of prior mitral valve annuloplasty. No aortic dissection or aneurysm. Heterogeneous atherosclerotic plaque. Tortuous and elongated transverse and descending thoracic aorta. Four vessel arch. The left vertebral artery arises directly from the aorta. Lungs/Pleura: 6 mm ground-glass attenuation nodular opacity in the right lung apex (image 10 series 706). A 4 mm nodule in the periphery of the right upper lobe (image 21 series 706). There is a 3 mm nodule in the periphery of the left upper lobe on image 26. 5 mm nodule in the periphery of the left lower lobe on image 42. There are a few additional technique scattered 2 mm  subpleural nodules. Whole dependent atelectasis in the medial aspect of both lower lobes likely secondary to the large hiatal hernia. Trace scarring versus is bandlike atelectasis in the inferior lingula. Lungs are otherwise clear. No focal consolidation. Bones/Soft Tissues: No acute fracture or aggressive appearing lytic or blastic osseous lesion. Review of the MIP images confirms the above findings. CTA ABDOMEN AND PELVIS FINDINGS VASCULAR Aorta: Mild ectasia of the infrarenal abdominal aorta with a maximal diameter of 2.6 cm. Her scattered atherosclerotic vascular calcifications but no significant stenosis or evidence of dissection. Celiac: Widely patent. Conventional hepatic arterial anatomy. No visceral artery aneurysm. SMA: Atherosclerotic plaque in the mid portion without significant stenosis. No aneurysm. Renals: Single dominant renal arteries without evidence of significant stenosis, aneurysm or fibromuscular dysplasia. IMA: Patent and unremarkable. Inflow: Minimal atherosclerotic plaque without evidence of aneurysm or significant stenosis. Proximal Outflow: Middle atherosclerotic plaque without evidence of aneurysm or significant stenosis. Veins: No focal venous abnormality. NON-VASCULAR Abdomen: Intrathoracic stomach due to very large hiatal hernia. Unremarkable CT appearance of the spleen, adrenal glands and pancreas save for mild prominence of the main pancreatic duct. There is no associated atrophy or mass. Normal hepatic contour and morphology. There are at least 4 subcentimeter circumscribed low-attenuation lesions scattered throughout the liver which are too small to characterize but statistically highly likely benign cysts. The main portal veins are patent. Gallbladder is unremarkable. No intra or extrahepatic biliary ductal dilatation. Unremarkable appearance of the bilateral kidneys. No focal solid lesion, hydronephrosis or nephrolithiasis. Small probable simple cyst upper pole left kidney.  Extensive colonic diverticulosis. No evidence of active inflammation. No evidence of bowel obstruction or focal bowel wall thickening. Moderate volume of formed stool in the rectum. Pelvis: Surgical changes of prior hysterectomy. The bladder is  distended with urine. Streak artifact from right total hip arthroplasty slightly limits evaluation. Bones/Soft Tissues: Stable chronic T7 compression fracture with approximately 60% height loss. Age indeterminate compression fracture of the inferior endplate of L1 with was 20% height loss. Multilevel degenerative disc disease. Generalized demineralization. Review of the MIP images confirms the above findings. IMPRESSION: CTA CHEST 1. No evidence of aortic aneurysm or dissection. 2. Massive hiatal hernia with nearly completely intra thoracic stomach. 3. Atherosclerosis and multivessel coronary artery calcifications. 4. Surgical changes of prior multivessel CABG happened mitral valve annuloplasty. 5. Cardiomegaly. 6. Multiple bilateral pulmonary nodules measuring up to 5 mm. In the absence of a known primary malignancy these likely represent sequelae of prior infection/inflammation or a granulomatous process. Consider follow-up CT scan of the chest in 1 year to confirm stability. 7. Chronic T7 compression fracture. CTA ABD/PELVIS 1. Fusiform ectasia of the infrarenal abdominal aorta with maximum transverse to the 2.6 cm. Ectatic abdominal aorta at risk for aneurysm development. Recommend followup by ultrasound in 5 years. This recommendation follows ACR consensus guidelines: White Paper of the ACR Incidental Findings Committee II on Vascular Findings. J Am Coll Radiol 2013; 10:789-794. 2. Extensive colonic diverticulosis without evidence of active inflammation. 3. Moderate volume retained stool in the rectum suggests an element of constipation. 4. Age indeterminate compression fracture of the inferior endplate of L1. Signed, Criselda Peaches, MD [facility] Radiology Electronically Signed   By: Jacqulynn Cadet M.D.   On: 12/19/2014 11:34   Ct Angio Abd/pel W/ And/or W/o  12/19/2014  CLINICAL DATA:  81 year old female with abdominal aortic aneurysm and flank pain EXAM: CT ANGIOGRAPHY CHEST, ABDOMEN AND PELVIS TECHNIQUE: Multidetector CT imaging through the chest, abdomen and pelvis was performed using the standard protocol during bolus administration of intravenous contrast. Multiplanar reconstructed images and MIPs were obtained and reviewed to evaluate the vascular anatomy. CONTRAST:  100 mL Omni 350 COMPARISON:  None. FINDINGS: CTA CHEST FINDINGS Mediastinum: Unremarkable CT appearance of the thyroid gland for age. No suspicious mediastinal or hilar adenopathy. No soft tissue mediastinal mass. Large sliding hiatal hernia with nearly completely intra thoracic stomach. Heart/Vascular: Cardiomegaly. Patient is status post median sternotomy with evidence of multivessel CABG including LIMA to LAD bypass. No pericardial effusion. Surgical changes of prior mitral valve annuloplasty. No aortic dissection or aneurysm. Heterogeneous atherosclerotic plaque. Tortuous and elongated transverse and descending thoracic aorta. Four vessel arch. The left vertebral artery arises directly from the aorta. Lungs/Pleura: 6 mm ground-glass attenuation nodular opacity in the right lung apex (image 10 series 706). A 4 mm nodule in the periphery of the right upper lobe (image 21 series 706). There is a 3 mm nodule in the periphery of the left upper lobe on image 26. 5 mm nodule in the periphery of the left lower lobe on image 42. There are a few additional technique scattered 2 mm subpleural nodules. Whole dependent atelectasis in the medial aspect of both lower lobes likely secondary to the large hiatal hernia. Trace scarring versus is bandlike atelectasis in the inferior lingula. Lungs are otherwise clear. No focal consolidation. Bones/Soft Tissues: No  acute fracture or aggressive appearing lytic or blastic osseous lesion. Review of the MIP images confirms the above findings. CTA ABDOMEN AND PELVIS FINDINGS VASCULAR Aorta: Mild ectasia of the infrarenal abdominal aorta with a maximal diameter of 2.6 cm. Her scattered atherosclerotic vascular calcifications but no significant stenosis or evidence of dissection. Celiac: Widely patent. Conventional hepatic arterial anatomy. No visceral  artery aneurysm. SMA: Atherosclerotic plaque in the mid portion without significant stenosis. No aneurysm. Renals: Single dominant renal arteries without evidence of significant stenosis, aneurysm or fibromuscular dysplasia. IMA: Patent and unremarkable. Inflow: Minimal atherosclerotic plaque without evidence of aneurysm or significant stenosis. Proximal Outflow: Middle atherosclerotic plaque without evidence of aneurysm or significant stenosis. Veins: No focal venous abnormality. NON-VASCULAR Abdomen: Intrathoracic stomach due to very large hiatal hernia. Unremarkable CT appearance of the spleen, adrenal glands and pancreas save for mild prominence of the main pancreatic duct. There is no associated atrophy or mass. Normal hepatic contour and morphology. There are at least 4 subcentimeter circumscribed low-attenuation lesions scattered throughout the liver which are too small to characterize but statistically highly likely benign cysts. The main portal veins are patent. Gallbladder is unremarkable. No intra or extrahepatic biliary ductal dilatation. Unremarkable appearance of the bilateral kidneys. No focal solid lesion, hydronephrosis or nephrolithiasis. Small probable simple cyst upper pole left kidney. Extensive colonic diverticulosis. No evidence of active inflammation. No evidence of bowel obstruction or focal bowel wall thickening. Moderate volume of formed stool in the rectum. Pelvis: Surgical changes of prior hysterectomy. The bladder is distended with urine. Streak artifact  from right total hip arthroplasty slightly limits evaluation. Bones/Soft Tissues: Stable chronic T7 compression fracture with approximately 60% height loss. Age indeterminate compression fracture of the inferior endplate of L1 with was 20% height loss. Multilevel degenerative disc disease. Generalized demineralization. Review of the MIP images confirms the above findings. IMPRESSION: CTA CHEST 1. No evidence of aortic aneurysm or dissection. 2. Massive hiatal hernia with nearly completely intra thoracic stomach. 3. Atherosclerosis and multivessel coronary artery calcifications. 4. Surgical changes of prior multivessel CABG happened mitral valve annuloplasty. 5. Cardiomegaly. 6. Multiple bilateral pulmonary nodules measuring up to 5 mm. In the absence of a known primary malignancy these likely represent sequelae of prior infection/inflammation or a granulomatous process. Consider follow-up CT scan of the chest in 1 year to confirm stability. 7. Chronic T7 compression fracture. CTA ABD/PELVIS 1. Fusiform ectasia of the infrarenal abdominal aorta with maximum transverse to the 2.6 cm. Ectatic abdominal aorta at risk for aneurysm development. Recommend followup by ultrasound in 5 years. This recommendation follows ACR consensus guidelines: White Paper of the ACR Incidental Findings Committee II on Vascular Findings. J Am Coll Radiol 2013; 10:789-794. 2. Extensive colonic diverticulosis without evidence of active inflammation. 3. Moderate volume retained stool in the rectum suggests an element of constipation. 4. Age indeterminate compression fracture of the inferior endplate of L1. Signed, Criselda Peaches, MD [facility] Radiology Electronically Signed   By: Jacqulynn Cadet M.D.   On: 12/19/2014 11:34      Lab Results  Component Value Date   HGBA1C 5.9* 10/08/2011   Lab Results  Component Value Date   LDLCALC 102* 11/16/2013   CREATININE 0.63 12/20/2014        Scheduled Meds: . docusate sodium  100 mg Oral BID  . enoxaparin (LOVENOX) injection  30 mg Subcutaneous Q24H  . lisinopril  5 mg Oral Daily  . methylPREDNISolone (SOLU-MEDROL) injection  60 mg Intravenous Daily  . potassium chloride  40 mEq Oral Once  . sodium chloride  3 mL Intravenous Q12H   Continuous Infusions: . sodium chloride 100 mL/hr at 12/20/14 0543    Principal Problem:   Bradycardia Active Problems:   Mitral regurgitation   Essential hypertension, benign   Coronary artery disease   Congestive heart failure (HCC)   HLD (hyperlipidemia)  Intractable low back pain   Pancreatitis   Compression fracture   Hypokalemia   Lung nodule   Aortic aneurysm ([place])    Time spent: 45 minutes   [place] [facility] Pager [phone]. If 7PM-7AM, please contact night-coverage at [email or web address], password [facility] 12/20/2014, 10:55 AM  LOS: 1 day

## 2014-12-21 DIAGNOSIS — I10 Essential (primary) hypertension: Secondary | ICD-10-CM

## 2014-12-21 DIAGNOSIS — M545 Low back pain: Secondary | ICD-10-CM

## 2014-12-21 DIAGNOSIS — I509 Heart failure, unspecified: Secondary | ICD-10-CM

## 2014-12-21 DIAGNOSIS — K859 Acute pancreatitis without necrosis or infection, unspecified: Secondary | ICD-10-CM

## 2014-12-21 DIAGNOSIS — T148 Other injury of unspecified body region: Secondary | ICD-10-CM

## 2014-12-21 LAB — COMPREHENSIVE METABOLIC PANEL
ALK PHOS: 81 U/L (ref 38–126)
ALT: 10 U/L — ABNORMAL LOW (ref 14–54)
ANION GAP: 7 (ref 5–15)
AST: 19 U/L (ref 15–41)
Albumin: 2.8 g/dL — ABNORMAL LOW (ref 3.5–5.0)
BILIRUBIN TOTAL: 0.4 mg/dL (ref 0.3–1.2)
BUN: 12 mg/dL (ref 6–20)
CALCIUM: 9.7 mg/dL (ref 8.9–10.3)
CO2: 28 mmol/L (ref 22–32)
Chloride: 104 mmol/L (ref 101–111)
Creatinine, Ser: 0.6 mg/dL (ref 0.44–1.00)
GFR calc non Af Amer: >60 mL/min (ref >60)
Glucose, Bld: 122 mg/dL — ABNORMAL HIGH (ref 65–99)
Potassium: 4.2 mmol/L (ref 3.5–5.1)
Sodium: 139 mmol/L (ref 135–145)
TOTAL PROTEIN: 5.9 g/dL — AB (ref 6.5–8.1)

## 2014-12-21 LAB — CBC
HEMATOCRIT: 37.7 % (ref 36.0–46.0)
HEMOGLOBIN: 12.3 g/dL (ref 12.0–15.0)
MCH: 29.4 pg (ref 26.0–34.0)
MCHC: 32.6 g/dL (ref 30.0–36.0)
MCV: 90 fL (ref 78.0–100.0)
Platelets: 203 10*3/uL (ref 150–400)
RBC: 4.19 MIL/uL (ref 3.87–5.11)
RDW: 13.9 % (ref 11.5–15.5)
WBC: 10.4 10*3/uL (ref 4.0–10.5)

## 2014-12-21 LAB — VITAMIN D 25 HYDROXY (VIT D DEFICIENCY, FRACTURES): VIT D 25 HYDROXY: 46.7 ng/mL (ref 30.0–100.0)

## 2014-12-21 MED ORDER — METHYLPREDNISOLONE SODIUM SUCC 40 MG IJ SOLR
40.0000 mg | Freq: Every day | INTRAMUSCULAR | Status: DC
  Administered 2014-12-22: 40 mg via INTRAVENOUS
  Filled 2014-12-21 (×2): qty 1

## 2014-12-21 NOTE — Progress Notes (Signed)
[facility] PROGRESS NOTE  Victoria Sexton [id number] DOB: 12-19-1927 DOA: 12/19/2014 PCP: Vidal Schwalbe, MD  Assessment/Plan: 81 y.o. female with PMH of dCHF, CAD, ischemic heart disease status post CABG 2013, history of mitral valve repair and angioplasty since emergency department with one-week history of intractable back pain.  -admitted with L1 compression fracture, symptomatic bradycardia, pancreatitis.  Assessment and plan 1. Bradycardia suspected due to BB.  -bradycardia->resolved.stopped BB. Cardiac enzymes negative. Normal TSH 2. Probable pancreatitis. Lipase 296, now 41, no clinical signs of acute pancreatitis. CT with mild prominence of main pancreatic duct -Denies abdominal pain, no nausea or vomiting, no EtOH use, no transaminitis. Advance diet as tolerated 3. Intractable back pain. compression fracture at L1. Cont TLSO brace -patient still reports uncontrolled pains with ambulation. patient prefers IR vertebroplasty. Cont Robaxin and IV steroids for better pain control. Ambulate with PT when pain is better controlled  4. Hypertension. Hold home beta blocker. continue ACE inhibitor. Monitor  5. Chronic Diastolic heart failureWithout exacerbation. Appears euvolemic. Beta blocker for now secondary to #1. EF last year 60% 6. Aortic aneurysm. Incidental finding on imaging done outpatient. Today CT negative for aortic aneurysm dissection -Patient instructed to schedule ultrasound but has not done this yet -Outpatient follow-up 7. Pulmonary nodules per CT. Incidental findings -Will need outpatient follow-up  Code Status: full Family Communication: d/w patient, her husband Therapist, sports,  (indicate person spoken with, relationship, and if by phone, the number) Disposition Plan: pend IR procedure    Consultants:  IR  Cardiology   Procedures:  none  Antibiotics:  None  (indicate start date, and stop date if known)  HPI/Subjective: Alert. Oriented   Objective: Filed  Vitals:   12/20/14 2006 12/21/14 0518  BP: 156/73 164/78  Pulse: 86 84  Temp: 97.7 F (36.5 C) 98.4 F (36.9 C)  Resp: 18 17    Intake/Output Summary (Last 24 hours) at 12/21/14 1134 Last data filed at 12/21/14 1026  Gross per 24 hour  Intake   1320 ml  Output    800 ml  Net    520 ml   Filed Weights   12/19/14 0717 12/19/14 1553 12/21/14 0518  Weight: 43.092 kg (95 lb) 43.409 kg (95 lb 11.2 oz) 43.7 kg (96 lb 5.5 oz)    Exam:   General:  No distress   Cardiovascular: s1,s2 rrr  Respiratory: CTA BL  Abdomen: soft, nt,nd   Musculoskeletal: no leg edema    Data Reviewed: Basic Metabolic Panel:  Recent Labs Lab 12/19/14 0728 12/19/14 1934 12/20/14 0348 12/21/14 0400  NA 138  --  137 139  K 3.3*  --  4.4 4.2  CL 103  --  106 104  CO2 24  --  26 28  GLUCOSE 133*  --  97 122*  BUN 8  --  6 12  CREATININE 0.77  --  0.63 0.60  CALCIUM 9.4  --  9.0 9.7  MG 2.0 2.1  --   --   PHOS  --  3.9  --   --    Liver Function Tests:  Recent Labs Lab 12/19/14 0728 12/20/14 0930 12/21/14 0400  AST 22 20 19   ALT 10* 8* 10*  ALKPHOS 88 73 81  BILITOT 0.5 0.4 0.4  PROT 6.9 5.9* 5.9*  ALBUMIN 3.6 3.1* 2.8*    Recent Labs Lab 12/19/14 0728 12/20/14 0348  LIPASE 296* 41   No results for input(s): AMMONIA in the last 168 hours. CBC:  Recent Labs Lab  12/19/14 0728 12/20/14 0348 12/21/14 0400  WBC 9.1 10.1 10.4  HGB 13.2 12.0 12.3  HCT 41.1 36.5 37.7  MCV 89.5 89.9 90.0  PLT 189 189 203   Cardiac Enzymes:  Recent Labs Lab 12/19/14 0728 12/19/14 1934 12/20/14 0348  TROPONINI <0.03 <0.03 <0.03   BNP (last 3 results) No results for input(s): BNP in the last 8760 hours.  ProBNP (last 3 results) No results for input(s): PROBNP in the last 8760 hours.  CBG: No results for input(s): GLUCAP in the last 168 hours.  No results found for this or any previous visit (from the past 240 hour(s)).   Studies: Mr Lumbar Spine W Wo Contrast  12/20/2014   CLINICAL DATA:  Patient fell in parking lot. Pain radiates to RIGHT hip. EXAM: MRI LUMBAR SPINE WITHOUT AND WITH CONTRAST TECHNIQUE: Multiplanar and multiecho pulse sequences of the lumbar spine were obtained without and with intravenous contrast. CONTRAST:  3mL MULTIHANCE GADOBENATE DIMEGLUMINE 529 MG/ML IV SOLN COMPARISON:  Lumbar spine radiograph 12/17/2014. CTA chest and abdomen 12/19/2014. FINDINGS: Segmentation: Normal. Alignment:  Normal except for trace retrolisthesis L1-2. Vertebrae: There is an acute compression fracture of L1, primarily inferior endplate deformity, but also rightward anterior inferior chip fractures, with edema confined to the lower 1/2 of the vertebral body, only minor wedging, and prevertebral soft tissue swelling. As demonstrated to greater detail on CT axial coronal images, the lateral inferior aspect of the vertebral body which is associated with osseous ridging, has fractured on its RIGHT side, and is displaced laterally. Tiny corner fracture anteriorly superiorly at L2, minor compared to the edema throughout L1. No significant retropulsion or epidural hematoma.No other worrisome osseous lesions. Conus medullaris: Normal in size, and signal. Trace low termination. Paraspinal tissues: No evidence for hydronephrosis. Renal cystic disease, incompletely evaluated. Abdominal aortic ectasia up to 28 mm diameter. Ectatic abdominal aorta at risk for aneurysm development. Recommend followup by ultrasound in 5 years. This recommendation follows ACR consensus guidelines: White Paper of the ACR Incidental Findings Committee II on Vascular Findings. J Am Coll Radiol 2013; 10:789-794. Disc levels: L1-L2: There is mild annular bulging/subligamentous protrusion, and disc osteophyte complex, eccentric to the RIGHT. No definite subarticular zone narrowing or epidural hematoma but asymmetric RIGHT-sided foraminal narrowing likely irritates the RIGHT L1 nerve root. In addition the RIGHT L1 nerve root  is likely displace in its extraforaminal course due to fractures of the RIGHT lateral inferior aspect of that vertebral body. See coronal CTA image 98 series 702, and axial CTA images 141-145 series 701. Pedicles are intact. L2-L3:  Tiny annular rent.  No impingement. L3-L4:  Unremarkable. L4-L5:  Unremarkable. L5-S1:  Unremarkable. Compared with plain films, the fracture is difficult to see due to osteopenia IMPRESSION: Acute compression fracture of L1, consistent with a posttraumatic osteopenic (nonpathologic) injury, with bone marrow edema primarily confined to the inferior 1/2 of the vertebral body. Inferior endplate deformity, with anterior chip fracture. Anterior prevertebral hematoma. The patient's RIGHT hip pain may relate to asymmetric foraminal narrowing at L1-2 on the RIGHT due to disc osteophyte complex as well as displacement of the extraforaminal nerve alongside the injured lateral inferior aspect of L1. See discussion above. Tiny anterior superior corner injury to L2, without wedging. The patient may be a candidate for vertebral augmentation if conservative measures do not result in improvement. Electronically Signed   By: Staci Righter M.D.   On: 12/20/2014 13:38    Scheduled Meds: . docusate sodium  100 mg Oral BID  .  enoxaparin (LOVENOX) injection  30 mg Subcutaneous Q24H  . feeding supplement (ENSURE ENLIVE)  237 mL Oral BID BM  . lisinopril  5 mg Oral Daily  . methylPREDNISolone (SOLU-MEDROL) injection  60 mg Intravenous Daily  . potassium chloride  40 mEq Oral Once  . sodium chloride  3 mL Intravenous Q12H   Continuous Infusions:   Principal Problem:   Bradycardia Active Problems:   Mitral regurgitation   Essential hypertension, benign   Coronary artery disease   Congestive heart failure (HCC)   HLD (hyperlipidemia)   Intractable low back pain   Pancreatitis   Compression fracture   Hypokalemia   Lung nodule   Aortic aneurysm (HCC)   Lumbar compression fracture  (HCC)   Protein-calorie malnutrition, severe    Time spent: >35 minutes     Kinnie Feil  [facility] Pager [phone]. If 7PM-7AM, please contact night-coverage at [email or web address], password [email or web address] 12/21/2014, 11:34 AM  LOS: 2 days

## 2014-12-21 NOTE — Progress Notes (Signed)
Cardiologist: Marlou Porch  SUBJECTIVE:  No CP, no SOB.  Still has back pain. Husband, ret. Col. In room.   OBJECTIVE:   Vitals:   Filed Vitals:   12/20/14 0406 12/20/14 1418 12/20/14 2006 12/21/14 0518  BP: 147/68 135/65 156/73 164/78  Pulse: 67 68 86 84  Temp: 98.2 F (36.8 C) 98 F (36.7 C) 97.7 F (36.5 C) 98.4 F (36.9 C)  TempSrc: Oral Oral Oral Oral  Resp: '18 17 18 17  ' Height:      Weight:    96 lb 5.5 oz (43.7 kg)  SpO2: 97% 96% 97% 97%   I&O's:    Intake/Output Summary (Last 24 hours) at 12/21/14 1118 Last data filed at 12/21/14 1026  Gross per 24 hour  Intake   1320 ml  Output    800 ml  Net    520 ml   TELEMETRY: Reviewed telemetry pt in NSR, PVCs:     PHYSICAL EXAM General: Thin in no acute distress Head:   Normal cephalic and atramatic  Lungs:   Clear bilaterally to auscultation. Heart:  HRRR S1 S2  No JVD, soft systolic murmur.   Abdomen: abdomen soft and non-tender Msk:  Back normal,  Normal strength and tone for age. Extremities:   No edema.   Neuro: Alert and oriented. Psych:  Normal affect, responds appropriately Skin: No rash   LABS: Basic Metabolic Panel:  Recent Labs  12/19/14 0728 12/19/14 1934 12/20/14 0348 12/21/14 0400  NA 138  --  137 139  K 3.3*  --  4.4 4.2  CL 103  --  106 104  CO2 24  --  26 28  GLUCOSE 133*  --  97 122*  BUN 8  --  6 12  CREATININE 0.77  --  0.63 0.60  CALCIUM 9.4  --  9.0 9.7  MG 2.0 2.1  --   --   PHOS  --  3.9  --   --    Liver Function Tests:  Recent Labs  12/20/14 0930 12/21/14 0400  AST 20 19  ALT 8* 10*  ALKPHOS 73 81  BILITOT 0.4 0.4  PROT 5.9* 5.9*  ALBUMIN 3.1* 2.8*    Recent Labs  12/19/14 0728 12/20/14 0348  LIPASE 296* 41   CBC:  Recent Labs  12/20/14 0348 12/21/14 0400  WBC 10.1 10.4  HGB 12.0 12.3  HCT 36.5 37.7  MCV 89.9 90.0  PLT 189 203   Cardiac Enzymes:  Recent Labs  12/19/14 0728 12/19/14 1934 12/20/14 0348  TROPONINI <0.03 <0.03 <0.03    Thyroid Function Tests:  Recent Labs  12/19/14 1934  TSH 1.014   Anemia Panel: No results for input(s): VITAMINB12, FOLATE, FERRITIN, TIBC, IRON, RETICCTPCT in the last 72 hours. Coag Panel:   Lab Results  Component Value Date   INR 1.10 12/20/2014   INR 1.00 04/16/2014   INR 2.21* 10/19/2011    RADIOLOGY: Dg Lumbar Spine 2-3 Views  12/17/2014  CLINICAL DATA:  Low back pain. EXAM: LUMBAR SPINE - 2-3 VIEW COMPARISON:  11/15/2011. FINDINGS: Thoracolumbar spine degenerative change and scoliosis. No acute abnormality identified. Diffuse osteopenia. 3.3 cm abdominal aortic aneurysm noted. Aortic ultrasound can be obtained for further evaluation. Sliding hiatal hernia. Prior cardiac valve replacement. IMPRESSION: 1. Diffuse osteopenia degenerative change with scoliosis concave right. 2. 3.3 cm abdominal aortic aneurysm. Aortic ultrasound can be obtained for further evaluation. 3. Sliding hiatal hernia. Electronically Signed   By: Marcello Moores  Register   On:  12/17/2014 14:56   Dg Pelvis 1-2 Views  12/17/2014  CLINICAL DATA:  Low back pain with bilateral sciatica for 1 week. No reported injury. EXAM: PELVIS - 1-2 VIEW COMPARISON:  April 17, 2014. FINDINGS: Status post right hemiarthroplasty. Prosthesis appears well situated. No fracture or dislocation is noted. Left hip appears normal. IMPRESSION: No acute abnormality seen. Electronically Signed   By: Marijo Conception, M.D.   On: 12/17/2014 14:56   Mr Lumbar Spine W Wo Contrast  12/20/2014  CLINICAL DATA:  Patient fell in parking lot. Pain radiates to RIGHT hip. EXAM: MRI LUMBAR SPINE WITHOUT AND WITH CONTRAST TECHNIQUE: Multiplanar and multiecho pulse sequences of the lumbar spine were obtained without and with intravenous contrast. CONTRAST:  75m MULTIHANCE GADOBENATE DIMEGLUMINE 529 MG/ML IV SOLN COMPARISON:  Lumbar spine radiograph 12/17/2014. CTA chest and abdomen 12/19/2014. FINDINGS: Segmentation: Normal. Alignment:  Normal except for trace  retrolisthesis L1-2. Vertebrae: There is an acute compression fracture of L1, primarily inferior endplate deformity, but also rightward anterior inferior chip fractures, with edema confined to the lower 1/2 of the vertebral body, only minor wedging, and prevertebral soft tissue swelling. As demonstrated to greater detail on CT axial coronal images, the lateral inferior aspect of the vertebral body which is associated with osseous ridging, has fractured on its RIGHT side, and is displaced laterally. Tiny corner fracture anteriorly superiorly at L2, minor compared to the edema throughout L1. No significant retropulsion or epidural hematoma.No other worrisome osseous lesions. Conus medullaris: Normal in size, and signal. Trace low termination. Paraspinal tissues: No evidence for hydronephrosis. Renal cystic disease, incompletely evaluated. Abdominal aortic ectasia up to 28 mm diameter. Ectatic abdominal aorta at risk for aneurysm development. Recommend followup by ultrasound in 5 years. This recommendation follows ACR consensus guidelines: White Paper of the ACR Incidental Findings Committee II on Vascular Findings. J Am Coll Radiol 2013; 10:789-794. Disc levels: L1-L2: There is mild annular bulging/subligamentous protrusion, and disc osteophyte complex, eccentric to the RIGHT. No definite subarticular zone narrowing or epidural hematoma but asymmetric RIGHT-sided foraminal narrowing likely irritates the RIGHT L1 nerve root. In addition the RIGHT L1 nerve root is likely displace in its extraforaminal course due to fractures of the RIGHT lateral inferior aspect of that vertebral body. See coronal CTA image 98 series 702, and axial CTA images 141-145 series 701. Pedicles are intact. L2-L3:  Tiny annular rent.  No impingement. L3-L4:  Unremarkable. L4-L5:  Unremarkable. L5-S1:  Unremarkable. Compared with plain films, the fracture is difficult to see due to osteopenia IMPRESSION: Acute compression fracture of L1,  consistent with a posttraumatic osteopenic (nonpathologic) injury, with bone marrow edema primarily confined to the inferior 1/2 of the vertebral body. Inferior endplate deformity, with anterior chip fracture. Anterior prevertebral hematoma. The patient's RIGHT hip pain may relate to asymmetric foraminal narrowing at L1-2 on the RIGHT due to disc osteophyte complex as well as displacement of the extraforaminal nerve alongside the injured lateral inferior aspect of L1. See discussion above. Tiny anterior superior corner injury to L2, without wedging. The patient may be a candidate for vertebral augmentation if conservative measures do not result in improvement. Electronically Signed   By: JStaci RighterM.D.   On: 12/20/2014 13:38   Ct Angio Chest Aorta W/cm &/or Wo/cm  12/19/2014  CLINICAL DATA:  81year old female with abdominal aortic aneurysm and flank pain EXAM: CT ANGIOGRAPHY CHEST, ABDOMEN AND PELVIS TECHNIQUE: Multidetector CT imaging through the chest, abdomen and pelvis was performed using the standard protocol during bolus  administration of intravenous contrast. Multiplanar reconstructed images and MIPs were obtained and reviewed to evaluate the vascular anatomy. CONTRAST:  100 mL Omni 350 COMPARISON:  None. FINDINGS: CTA CHEST FINDINGS Mediastinum: Unremarkable CT appearance of the thyroid gland for age. No suspicious mediastinal or hilar adenopathy. No soft tissue mediastinal mass. Large sliding hiatal hernia with nearly completely intra thoracic stomach. Heart/Vascular: Cardiomegaly. Patient is status post median sternotomy with evidence of multivessel CABG including LIMA to LAD bypass. No pericardial effusion. Surgical changes of prior mitral valve annuloplasty. No aortic dissection or aneurysm. Heterogeneous atherosclerotic plaque. Tortuous and elongated transverse and descending thoracic aorta. Four vessel arch. The left vertebral artery arises directly from the aorta. Lungs/Pleura: 6 mm  ground-glass attenuation nodular opacity in the right lung apex (image 10 series 706). A 4 mm nodule in the periphery of the right upper lobe (image 21 series 706). There is a 3 mm nodule in the periphery of the left upper lobe on image 26. 5 mm nodule in the periphery of the left lower lobe on image 42. There are a few additional technique scattered 2 mm subpleural nodules. Whole dependent atelectasis in the medial aspect of both lower lobes likely secondary to the large hiatal hernia. Trace scarring versus is bandlike atelectasis in the inferior lingula. Lungs are otherwise clear. No focal consolidation. Bones/Soft Tissues: No acute fracture or aggressive appearing lytic or blastic osseous lesion. Review of the MIP images confirms the above findings. CTA ABDOMEN AND PELVIS FINDINGS VASCULAR Aorta: Mild ectasia of the infrarenal abdominal aorta with a maximal diameter of 2.6 cm. Her scattered atherosclerotic vascular calcifications but no significant stenosis or evidence of dissection. Celiac: Widely patent. Conventional hepatic arterial anatomy. No visceral artery aneurysm. SMA: Atherosclerotic plaque in the mid portion without significant stenosis. No aneurysm. Renals: Single dominant renal arteries without evidence of significant stenosis, aneurysm or fibromuscular dysplasia. IMA: Patent and unremarkable. Inflow: Minimal atherosclerotic plaque without evidence of aneurysm or significant stenosis. Proximal Outflow: Middle atherosclerotic plaque without evidence of aneurysm or significant stenosis. Veins: No focal venous abnormality. NON-VASCULAR Abdomen: Intrathoracic stomach due to very large hiatal hernia. Unremarkable CT appearance of the spleen, adrenal glands and pancreas save for mild prominence of the main pancreatic duct. There is no associated atrophy or mass. Normal hepatic contour and morphology. There are at least 4 subcentimeter circumscribed low-attenuation lesions scattered throughout the liver  which are too small to characterize but statistically highly likely benign cysts. The main portal veins are patent. Gallbladder is unremarkable. No intra or extrahepatic biliary ductal dilatation. Unremarkable appearance of the bilateral kidneys. No focal solid lesion, hydronephrosis or nephrolithiasis. Small probable simple cyst upper pole left kidney. Extensive colonic diverticulosis. No evidence of active inflammation. No evidence of bowel obstruction or focal bowel wall thickening. Moderate volume of formed stool in the rectum. Pelvis: Surgical changes of prior hysterectomy. The bladder is distended with urine. Streak artifact from right total hip arthroplasty slightly limits evaluation. Bones/Soft Tissues: Stable chronic T7 compression fracture with approximately 60% height loss. Age indeterminate compression fracture of the inferior endplate of L1 with was 20% height loss. Multilevel degenerative disc disease. Generalized demineralization. Review of the MIP images confirms the above findings. IMPRESSION: CTA CHEST 1. No evidence of aortic aneurysm or dissection. 2. Massive hiatal hernia with nearly completely intra thoracic stomach. 3. Atherosclerosis and multivessel coronary artery calcifications. 4. Surgical changes of prior multivessel CABG happened mitral valve annuloplasty. 5. Cardiomegaly. 6. Multiple bilateral pulmonary nodules measuring up to 5 mm. In the  absence of a known primary malignancy these likely represent sequelae of prior infection/inflammation or a granulomatous process. Consider follow-up CT scan of the chest in 1 year to confirm stability. 7. Chronic T7 compression fracture. CTA ABD/PELVIS 1. Fusiform ectasia of the infrarenal abdominal aorta with maximum transverse to the 2.6 cm. Ectatic abdominal aorta at risk for aneurysm development. Recommend followup by ultrasound in 5 years. This recommendation follows ACR consensus guidelines: White Paper of the ACR Incidental Findings Committee  II on Vascular Findings. J Am Coll Radiol 2013; 10:789-794. 2. Extensive colonic diverticulosis without evidence of active inflammation. 3. Moderate volume retained stool in the rectum suggests an element of constipation. 4. Age indeterminate compression fracture of the inferior endplate of L1. Signed, Criselda Peaches, MD [facility] Electronically Signed   By: Jacqulynn Cadet M.D.   On: 12/19/2014 11:34   Ct Angio Abd/pel W/ And/or W/o  12/19/2014  CLINICAL DATA:  81 year old female with abdominal aortic aneurysm and flank pain EXAM: CT ANGIOGRAPHY CHEST, ABDOMEN AND PELVIS TECHNIQUE: Multidetector CT imaging through the chest, abdomen and pelvis was performed using the standard protocol during bolus administration of intravenous contrast. Multiplanar reconstructed images and MIPs were obtained and reviewed to evaluate the vascular anatomy. CONTRAST:  100 mL Omni 350 COMPARISON:  None. FINDINGS: CTA CHEST FINDINGS Mediastinum: Unremarkable CT appearance of the thyroid gland for age. No suspicious mediastinal or hilar adenopathy. No soft tissue mediastinal mass. Large sliding hiatal hernia with nearly completely intra thoracic stomach. Heart/Vascular: Cardiomegaly. Patient is status post median sternotomy with evidence of multivessel CABG including LIMA to LAD bypass. No pericardial effusion. Surgical changes of prior mitral valve annuloplasty. No aortic dissection or aneurysm. Heterogeneous atherosclerotic plaque. Tortuous and elongated transverse and descending thoracic aorta. Four vessel arch. The left vertebral artery arises directly from the aorta. Lungs/Pleura: 6 mm ground-glass attenuation nodular opacity in the right lung apex (image 10 series 706). A 4 mm nodule in the periphery of the right upper lobe (image 21 series 706). There is a 3 mm nodule in the periphery of the left upper lobe on image 26. 5 mm nodule in the periphery of the left  lower lobe on image 42. There are a few additional technique scattered 2 mm subpleural nodules. Whole dependent atelectasis in the medial aspect of both lower lobes likely secondary to the large hiatal hernia. Trace scarring versus is bandlike atelectasis in the inferior lingula. Lungs are otherwise clear. No focal consolidation. Bones/Soft Tissues: No acute fracture or aggressive appearing lytic or blastic osseous lesion. Review of the MIP images confirms the above findings. CTA ABDOMEN AND PELVIS FINDINGS VASCULAR Aorta: Mild ectasia of the infrarenal abdominal aorta with a maximal diameter of 2.6 cm. Her scattered atherosclerotic vascular calcifications but no significant stenosis or evidence of dissection. Celiac: Widely patent. Conventional hepatic arterial anatomy. No visceral artery aneurysm. SMA: Atherosclerotic plaque in the mid portion without significant stenosis. No aneurysm. Renals: Single dominant renal arteries without evidence of significant stenosis, aneurysm or fibromuscular dysplasia. IMA: Patent and unremarkable. Inflow: Minimal atherosclerotic plaque without evidence of aneurysm or significant stenosis. Proximal Outflow: Middle atherosclerotic plaque without evidence of aneurysm or significant stenosis. Veins: No focal venous abnormality. NON-VASCULAR Abdomen: Intrathoracic stomach due to very large hiatal hernia. Unremarkable CT appearance of the spleen, adrenal glands and pancreas save for mild prominence of the main pancreatic duct. There is no associated atrophy or mass. Normal hepatic contour and morphology. There are at least 4 subcentimeter circumscribed  low-attenuation lesions scattered throughout the liver which are too small to characterize but statistically highly likely benign cysts. The main portal veins are patent. Gallbladder is unremarkable. No intra or extrahepatic biliary ductal dilatation. Unremarkable appearance of the bilateral kidneys. No focal solid lesion, hydronephrosis  or nephrolithiasis. Small probable simple cyst upper pole left kidney. Extensive colonic diverticulosis. No evidence of active inflammation. No evidence of bowel obstruction or focal bowel wall thickening. Moderate volume of formed stool in the rectum. Pelvis: Surgical changes of prior hysterectomy. The bladder is distended with urine. Streak artifact from right total hip arthroplasty slightly limits evaluation. Bones/Soft Tissues: Stable chronic T7 compression fracture with approximately 60% height loss. Age indeterminate compression fracture of the inferior endplate of L1 with was 20% height loss. Multilevel degenerative disc disease. Generalized demineralization. Review of the MIP images confirms the above findings. IMPRESSION: CTA CHEST 1. No evidence of aortic aneurysm or dissection. 2. Massive hiatal hernia with nearly completely intra thoracic stomach. 3. Atherosclerosis and multivessel coronary artery calcifications. 4. Surgical changes of prior multivessel CABG happened mitral valve annuloplasty. 5. Cardiomegaly. 6. Multiple bilateral pulmonary nodules measuring up to 5 mm. In the absence of a known primary malignancy these likely represent sequelae of prior infection/inflammation or a granulomatous process. Consider follow-up CT scan of the chest in 1 year to confirm stability. 7. Chronic T7 compression fracture. CTA ABD/PELVIS 1. Fusiform ectasia of the infrarenal abdominal aorta with maximum transverse to the 2.6 cm. Ectatic abdominal aorta at risk for aneurysm development. Recommend followup by ultrasound in 5 years. This recommendation follows ACR consensus guidelines: White Paper of the ACR Incidental Findings Committee II on Vascular Findings. J Am Coll Radiol 2013; 10:789-794. 2. Extensive colonic diverticulosis without evidence of active inflammation. 3. Moderate volume retained stool in the rectum suggests an element of constipation. 4. Age indeterminate compression fracture of the inferior  endplate of L1. Signed, Criselda Peaches, MD [facility] Electronically Signed   By: Jacqulynn Cadet M.D.   On: 12/19/2014 11:34      ASSESSMENT: PLAN:    Bradycardia - resolved off of Toprol (87m QD).  - In the ER, she was initially stable but hypertensive with blood pressure 220/100. On the monitor, she was noted to have a heart rate that would drop down into the 20s. She was having pauses up to 4 seconds and the next beat would be junctional, not sinus. According to the RN who was in the room with her, the bradycardia would precede the nausea. Likely Vagal. - I would continue to hold beta blocker.   - No indication for pacer at this time.   - OK to proceed with Kyphoplasty (Monday perhaps)  CAD - CABG 2013 w/ LIMA to LAD, SVG to RCA, SVG-OM, PFO closure, endocardial mass removal (benign)   MV repair  Orthostatic hypotension -stable off lasix  Malnutrition - struggle to eat/maintain weight.   Lumbar compression fracture -awaiting kyphoplasty  No further cardiac issues. Will sign off. Please call if ?  MJerline Pain MD  12/21/2014  11:18 AM

## 2014-12-21 NOTE — Evaluation (Addendum)
Physical Therapy Evaluation Patient Details Name: Victoria Sexton MRN: [id number] DOB: 03-24-1927 Today's Date: 12/21/2014   History of Present Illness  Patient is a 81 y/o female with hx of diastolic heart failure, CAD, ischemic heart disease s/p CABG 2013, history of MVR and angioplasty presents with intractable back pain.MRI of the lumbar spine-acute compression fracture at L1 in addition to some asymmetric foraminal narrowing at L1 -2 on the right due to disc osteophyte complex as well as displacement of the extraforaminal nerve alongside the injured lateral inferior aspect of L1. Plan for possible L1 vertebroplasty/kyphoplasty.  Clinical Impression  Mobility assessment limited due to pain. Pt only able to tolerate rolling to right/left to get on bed pan and get to half sitting position in sidelying but not able to tolerate upright sitting EOB due to pain. Pain not controlled. Education provided re: TLSO. Pt/family interested in vertebroplasty. Pt has support at home from spouse. Will need to reassess post procedure. Pt independent at home PTA. Will follow acutely to perform further mobility assessment and to maximize independence prior to return home.    Follow Up Recommendations Home health PT;Supervision/Assistance - 24 hour    Equipment Recommendations  None recommended by PT (reassess post surgery)    Recommendations for Other Services OT consult     Precautions / Restrictions Precautions Precautions: Back Precaution Booklet Issued: No Precaution Comments: Reviewed back precautions and positioning. Required Braces or Orthoses: Spinal Brace Spinal Brace: Thoracolumbosacral orthotic (does not state what position to put brace on it.) Restrictions Weight Bearing Restrictions: No      Mobility  Bed Mobility Overal bed mobility: Needs Assistance Bed Mobility: Rolling;Sidelying to Sit Rolling: Min assist         General bed mobility comments: Min A to roll to right and left with  cues for log roll technique and using rails for support. Attempted sidelying to sit but pt only able to get to half sitting and needed to return to supine due to pain.  Transfers Overall transfer level:  (NA secondary to pain.)                  Ambulation/Gait                Stairs            Wheelchair Mobility    Modified Rankin (Stroke Patients Only)       Balance Overall balance assessment:  (NA as pt not able to tolerate sitting EOB.)                                           Pertinent Vitals/Pain Pain Assessment: Faces Faces Pain Scale: Hurts worst Pain Location: back with movement Pain Descriptors / Indicators: Sharp;Grimacing;Sore Pain Intervention(s): Monitored during session;Repositioned;Premedicated before session;Limited activity within patient's tolerance    Home Living Family/patient expects to be discharged to:: Private residence Living Arrangements: Spouse/significant other Available Help at Discharge: Family;Available 24 hours/day Type of Home: House Home Access: Stairs to enter   [employer] of Steps: 5 Home Layout: Bed/bath upstairs;Two level Home Equipment: Walker - 2 wheels;Cane - single point Additional Comments: Plans to have wife sleep in recliner on first level at home initially.     Prior Function Level of Independence: Independent         Comments: Putting up xmas decorations last week.     Hand Dominance  Dominant Hand: Right    Extremity/Trunk Assessment   Upper Extremity Assessment: Defer to OT evaluation           Lower Extremity Assessment: Generalized weakness (Limited assessment due to pain. No numbness/tingling)         Communication   Communication: No difficulties  Cognition Arousal/Alertness: Awake/alert Behavior During Therapy: WFL for tasks assessed/performed Overall Cognitive Status: Within Functional Limits for tasks assessed                       General Comments General comments (skin integrity, edema, etc.): Spouse present during session. Discussed TLSO, plans for home, pain control, precautions etc.     Exercises        Assessment/Plan    PT Assessment Patient needs continued PT services  PT Diagnosis Difficulty walking;Acute pain   PT Problem List Decreased strength;Pain;Decreased activity tolerance;Decreased balance;Decreased mobility  PT Treatment Interventions Balance training;Functional mobility training;Therapeutic activities;Therapeutic exercise;Patient/family education;Stair training;Gait training;DME instruction   PT Goals (Current goals can be found in the Care Plan section) Acute Rehab PT Goals Patient Stated Goal: to get this pain under control and return to independence PT Goal Formulation: With patient Time For Goal Achievement: 01/04/15 Potential to Achieve Goals: Fair    Frequency Min 3X/week   Barriers to discharge Inaccessible home environment steps to enter home and not sure level of support spouse can provide    Co-evaluation               End of Session   Activity Tolerance: Patient limited by pain Patient left: in bed;with call bell/phone within reach;with family/visitor present;with SCD's reapplied (on bed pan) Nurse Communication: Mobility status;Precautions         Time: [id number] PT Time Calculation (min) (ACUTE ONLY): 16 min   Charges:   PT Evaluation $Initial PT Evaluation Tier I: 1 Procedure     PT G Codes:        Victoria Sexton 12/21/2014, 10:54 AM  Wray Kearns, [place], DPT [phone]

## 2014-12-22 NOTE — Care Management Important Message (Signed)
Important Message  Patient Details  Name: Victoria Sexton MRN: [id number] Date of Birth: 1927-06-27   Medicare Important Message Given:  Yes    Apolonio Schneiders, RN 12/22/2014, 11:20 AM

## 2014-12-22 NOTE — Progress Notes (Signed)
[facility] PROGRESS NOTE  Victoria Sexton [id number] DOB: [birth year] DOA: 12/19/2014 PCP: Vidal Schwalbe, MD  Length of stay: 3   Assessment/Plan: Principal Problem:   Bradycardia Active Problems:   Mitral regurgitation   Essential hypertension, benign   Coronary artery disease   Congestive heart failure (HCC)   HLD (hyperlipidemia)   Intractable low back pain   Pancreatitis   Compression fracture   Hypokalemia   Lung nodule   Aortic aneurysm (HCC)   Lumbar compression fracture (HCC)   Protein-calorie malnutrition, severe   History of present illness 81 y.o. female with PMH of dCHF, CAD, ischemic heart disease status post CABG 2013, history of mitral valve repair and angioplasty since emergency department with one-week history of intractable back pain.  -admitted with L1 compression fracture, symptomatic bradycardia, pancreatitis.   Assessment and plan 1. Bradycardia.  Marland Kitchentransient w/ junctional escape beats  She does take Toprol at home , which was discontinued Cardiac enzymes negative Normal TSH    #2 probable pancreatitis. Lipase 296, now 41, no clinical signs of acute pancreatitis CT with mild prominence of main pancreatic duct -Denies abdominal pain, no nausea or vomiting, no EtOH use, no transaminitis Advance diet as tolerated   #3. Intractable back pain. Likely related to compression fracture at L1 TLSO brace, MRI of the lumbar spine,IR consultation for  vertebroplasty, family requesting this to be done tomorrow, will keep npo after midnight Continue Robaxin and IV steroids for better pain control Ambulate with PT when pain is better controlled  Vitamin D levels okay  #4. Hypertension -Controlled in the emergency department -Hold home beta blocker -Continue ACE inhibitor  #5. Hypokalemia.Resolved  #6. CAD. No chest pain. Initial troponin at baseline. History of ischemic heart disease status post CABG 2013 as well as mitral valve repair  angioplasty 2013 Stable, cardiology following -Last echo last year with the EF of 60%  #7.  chronic Diastolic heart failureWithout exacerbation -Appears euvolemic -We'll monitor intake and output obtain daily weights  -EF last year 60%  #8. Aortic aneurysm. Incidental finding on imaging done outpatient. Today CT   negative for aortic aneurysm dissection -Patient instructed to schedule ultrasound but has not done this yet -Outpatient follow-up   #9. Pulmonary nodules per CT -Incidental findings Will need outpatient follow-up   DVT prophylaxsis Lovenox, hold for procedure past midnight   Code Status:      Code Status Orders        Start     Ordered   12/19/14 1811  Full code   Continuous     12/19/14 1811      Family Communication: Discussed in detail with the patient, all imaging results, lab results explained to the patient   Disposition Plan:  Interventional radiology consultation, MRI of the back     Brief narrative: Victoria Sexton is a pleasant 81 y.o. female with a past medical history that includes diastolic heart failure, CAD, ischemic heart disease status post CABG 2013, history of mitral valve repair and angioplasty since emergency department with one-week history of intractable back pain. Initial evaluation in the emergency department reveals L1 compression fracture age-indeterminate, symptomatic bradycardia, pancreatitis.  She reports chronic back pain since her fall in April however the last week lower back pain worsening. He denies any fall or muscle strain. He reports the pain is constant sharp located intermittent low back radiating to both hips. She denies any numbness or tingling of her lower extremities. She reports being "  steady on my feet" not needing a walker or cane she reports no falls since April and her husband who is at the bedside confirms. She was seen by PCP 2 days ago and had outpatient imaging revealed no bony abnormalities. While in the emergency  department she got up to the bedside commode heart rate dropped to 40 she became slightly diaphoretic and complained of dizziness and nausea". A similar episode while lying in the bed heart rate went to 42. She reports no chest pain palpitations fever chills recent sick contacts. She reports no abdominal pain nausea vomiting diarrhea. She reports a tendency towards constipation last bowel movement 2 days ago. Reports decreased oral intake denies unintentional weight loss  In the emergency department she is afebrile with a blood pressure 145/59. Heart rate range is 40 -67. She is not hypoxic   Consultants:  Cardiology  Procedures:  None  Antibiotics: Anti-infectives    None         HPI/Subjective: Husband by the bedsidanxious to get the procedure done quickly, states that they have been here for 5 days waiting on the procedure, informed them that the patient had not made up her mind as of 12/16 whether she would like to have the procedure done on not   Objective: Filed Vitals:   12/21/14 0518 12/21/14 1408 12/21/14 1930 12/22/14 0342  BP: 164/78 120/62 177/88 163/78  Pulse: 84 78 88 79  Temp: 98.4 F (36.9 C) 98.3 F (36.8 C) 98.5 F (36.9 C) 97.8 F (36.6 C)  TempSrc: Oral Oral Oral Oral  Resp: 17 19 16 18   Height:      Weight: 43.7 kg (96 lb 5.5 oz)   43.364 kg (95 lb 9.6 oz)  SpO2: 97% 96% 97% 96%    Intake/Output Summary (Last 24 hours) at 12/22/14 1117 Last data filed at 12/22/14 0900  Gross per 24 hour  Intake    520 ml  Output   1550 ml  Net  -1030 ml    Exam:  General: Very uncomfortable because of low back pain Lungs: Clear to auscultation bilaterally without wheezes or crackles Cardiovascular: Regular rate and rhythm without murmur gallop or rub normal S1 and S2 Abdomen: Nontender, nondistended, soft, bowel sounds positive, no rebound, no ascites, no appreciable mass Extremities: No significant cyanosis, clubbing, or edema bilateral lower extremities,  increased paraspinal muscle spasm     Data Review   Micro Results No results found for this or any previous visit (from the past 240 hour(s)).  Radiology Reports Dg Lumbar Spine 2-3 Views  12/17/2014  CLINICAL DATA:  Low back pain. EXAM: LUMBAR SPINE - 2-3 VIEW COMPARISON:  11/15/2011. FINDINGS: Thoracolumbar spine degenerative change and scoliosis. No acute abnormality identified. Diffuse osteopenia. 3.3 cm abdominal aortic aneurysm noted. Aortic ultrasound can be obtained for further evaluation. Sliding hiatal hernia. Prior cardiac valve replacement. IMPRESSION: 1. Diffuse osteopenia degenerative change with scoliosis concave right. 2. 3.3 cm abdominal aortic aneurysm. Aortic ultrasound can be obtained for further evaluation. 3. Sliding hiatal hernia. Electronically Signed   By: Marcello Moores  Register   On: 12/17/2014 14:56   Dg Pelvis 1-2 Views  12/17/2014  CLINICAL DATA:  Low back pain with bilateral sciatica for 1 week. No reported injury. EXAM: PELVIS - 1-2 VIEW COMPARISON:  April 17, 2014. FINDINGS: Status post right hemiarthroplasty. Prosthesis appears well situated. No fracture or dislocation is noted. Left hip appears normal. IMPRESSION: No acute abnormality seen. Electronically Signed   By: Marijo Conception, M.D.  On: 12/17/2014 14:56   Mr Lumbar Spine W Wo Contrast  12/20/2014  CLINICAL DATA:  Patient fell in parking lot. Pain radiates to RIGHT hip. EXAM: MRI LUMBAR SPINE WITHOUT AND WITH CONTRAST TECHNIQUE: Multiplanar and multiecho pulse sequences of the lumbar spine were obtained without and with intravenous contrast. CONTRAST:  72mL MULTIHANCE GADOBENATE DIMEGLUMINE 529 MG/ML IV SOLN COMPARISON:  Lumbar spine radiograph 12/17/2014. CTA chest and abdomen 12/19/2014. FINDINGS: Segmentation: Normal. Alignment:  Normal except for trace retrolisthesis L1-2. Vertebrae: There is an acute compression fracture of L1, primarily inferior endplate deformity, but also rightward anterior inferior  chip fractures, with edema confined to the lower 1/2 of the vertebral body, only minor wedging, and prevertebral soft tissue swelling. As demonstrated to greater detail on CT axial coronal images, the lateral inferior aspect of the vertebral body which is associated with osseous ridging, has fractured on its RIGHT side, and is displaced laterally. Tiny corner fracture anteriorly superiorly at L2, minor compared to the edema throughout L1. No significant retropulsion or epidural hematoma.No other worrisome osseous lesions. Conus medullaris: Normal in size, and signal. Trace low termination. Paraspinal tissues: No evidence for hydronephrosis. Renal cystic disease, incompletely evaluated. Abdominal aortic ectasia up to 28 mm diameter. Ectatic abdominal aorta at risk for aneurysm development. Recommend followup by ultrasound in 5 years. This recommendation follows ACR consensus guidelines: White Paper of the ACR Incidental Findings Committee II on Vascular Findings. J Am Coll Radiol 2013; 10:789-794. Disc levels: L1-L2: There is mild annular bulging/subligamentous protrusion, and disc osteophyte complex, eccentric to the RIGHT. No definite subarticular zone narrowing or epidural hematoma but asymmetric RIGHT-sided foraminal narrowing likely irritates the RIGHT L1 nerve root. In addition the RIGHT L1 nerve root is likely displace in its extraforaminal course due to fractures of the RIGHT lateral inferior aspect of that vertebral body. See coronal CTA image 98 series 702, and axial CTA images 141-145 series 701. Pedicles are intact. L2-L3:  Tiny annular rent.  No impingement. L3-L4:  Unremarkable. L4-L5:  Unremarkable. L5-S1:  Unremarkable. Compared with plain films, the fracture is difficult to see due to osteopenia IMPRESSION: Acute compression fracture of L1, consistent with a posttraumatic osteopenic (nonpathologic) injury, with bone marrow edema primarily confined to the inferior 1/2 of the vertebral body. Inferior  endplate deformity, with anterior chip fracture. Anterior prevertebral hematoma. The patient's RIGHT hip pain may relate to asymmetric foraminal narrowing at L1-2 on the RIGHT due to disc osteophyte complex as well as displacement of the extraforaminal nerve alongside the injured lateral inferior aspect of L1. See discussion above. Tiny anterior superior corner injury to L2, without wedging. The patient may be a candidate for vertebral augmentation if conservative measures do not result in improvement. Electronically Signed   By: Staci Righter M.D.   On: 12/20/2014 13:38   Ct Angio Chest Aorta W/cm &/or Wo/cm  12/19/2014  CLINICAL DATA:  81 year old female with abdominal aortic aneurysm and flank pain EXAM: CT ANGIOGRAPHY CHEST, ABDOMEN AND PELVIS TECHNIQUE: Multidetector CT imaging through the chest, abdomen and pelvis was performed using the standard protocol during bolus administration of intravenous contrast. Multiplanar reconstructed images and MIPs were obtained and reviewed to evaluate the vascular anatomy. CONTRAST:  100 mL Omni 350 COMPARISON:  None. FINDINGS: CTA CHEST FINDINGS Mediastinum: Unremarkable CT appearance of the thyroid gland for age. No suspicious mediastinal or hilar adenopathy. No soft tissue mediastinal mass. Large sliding hiatal hernia with nearly completely intra thoracic stomach. Heart/Vascular: Cardiomegaly. Patient is status post median sternotomy with evidence  of multivessel CABG including LIMA to LAD bypass. No pericardial effusion. Surgical changes of prior mitral valve annuloplasty. No aortic dissection or aneurysm. Heterogeneous atherosclerotic plaque. Tortuous and elongated transverse and descending thoracic aorta. Four vessel arch. The left vertebral artery arises directly from the aorta. Lungs/Pleura: 6 mm ground-glass attenuation nodular opacity in the right lung apex (image 10 series 706). A 4 mm nodule in the periphery of the right upper lobe (image 21 series 706). There  is a 3 mm nodule in the periphery of the left upper lobe on image 26. 5 mm nodule in the periphery of the left lower lobe on image 42. There are a few additional technique scattered 2 mm subpleural nodules. Whole dependent atelectasis in the medial aspect of both lower lobes likely secondary to the large hiatal hernia. Trace scarring versus is bandlike atelectasis in the inferior lingula. Lungs are otherwise clear. No focal consolidation. Bones/Soft Tissues: No acute fracture or aggressive appearing lytic or blastic osseous lesion. Review of the MIP images confirms the above findings. CTA ABDOMEN AND PELVIS FINDINGS VASCULAR Aorta: Mild ectasia of the infrarenal abdominal aorta with a maximal diameter of 2.6 cm. Her scattered atherosclerotic vascular calcifications but no significant stenosis or evidence of dissection. Celiac: Widely patent. Conventional hepatic arterial anatomy. No visceral artery aneurysm. SMA: Atherosclerotic plaque in the mid portion without significant stenosis. No aneurysm. Renals: Single dominant renal arteries without evidence of significant stenosis, aneurysm or fibromuscular dysplasia. IMA: Patent and unremarkable. Inflow: Minimal atherosclerotic plaque without evidence of aneurysm or significant stenosis. Proximal Outflow: Middle atherosclerotic plaque without evidence of aneurysm or significant stenosis. Veins: No focal venous abnormality. NON-VASCULAR Abdomen: Intrathoracic stomach due to very large hiatal hernia. Unremarkable CT appearance of the spleen, adrenal glands and pancreas save for mild prominence of the main pancreatic duct. There is no associated atrophy or mass. Normal hepatic contour and morphology. There are at least 4 subcentimeter circumscribed low-attenuation lesions scattered throughout the liver which are too small to characterize but statistically highly likely benign cysts. The main portal veins are patent. Gallbladder is unremarkable. No intra or extrahepatic  biliary ductal dilatation. Unremarkable appearance of the bilateral kidneys. No focal solid lesion, hydronephrosis or nephrolithiasis. Small probable simple cyst upper pole left kidney. Extensive colonic diverticulosis. No evidence of active inflammation. No evidence of bowel obstruction or focal bowel wall thickening. Moderate volume of formed stool in the rectum. Pelvis: Surgical changes of prior hysterectomy. The bladder is distended with urine. Streak artifact from right total hip arthroplasty slightly limits evaluation. Bones/Soft Tissues: Stable chronic T7 compression fracture with approximately 60% height loss. Age indeterminate compression fracture of the inferior endplate of L1 with was 20% height loss. Multilevel degenerative disc disease. Generalized demineralization. Review of the MIP images confirms the above findings. IMPRESSION: CTA CHEST 1. No evidence of aortic aneurysm or dissection. 2. Massive hiatal hernia with nearly completely intra thoracic stomach. 3. Atherosclerosis and multivessel coronary artery calcifications. 4. Surgical changes of prior multivessel CABG happened mitral valve annuloplasty. 5. Cardiomegaly. 6. Multiple bilateral pulmonary nodules measuring up to 5 mm. In the absence of a known primary malignancy these likely represent sequelae of prior infection/inflammation or a granulomatous process. Consider follow-up CT scan of the chest in 1 year to confirm stability. 7. Chronic T7 compression fracture. CTA ABD/PELVIS 1. Fusiform ectasia of the infrarenal abdominal aorta with maximum transverse to the 2.6 cm. Ectatic abdominal aorta at risk for aneurysm development. Recommend followup by ultrasound in 5 years. This recommendation follows ACR consensus  guidelines: White Paper of the ACR Incidental Findings Committee II on Vascular Findings. J Am Coll Radiol 2013; 10:789-794. 2. Extensive colonic diverticulosis without evidence of active inflammation. 3. Moderate volume retained stool  in the rectum suggests an element of constipation. 4. Age indeterminate compression fracture of the inferior endplate of L1. Signed, Criselda Peaches, MD [facility] Radiology Electronically Signed   By: Jacqulynn Cadet M.D.   On: 12/19/2014 11:34   Ct Angio Abd/pel W/ And/or W/o  12/19/2014  CLINICAL DATA:  81 year old female with abdominal aortic aneurysm and flank pain EXAM: CT ANGIOGRAPHY CHEST, ABDOMEN AND PELVIS TECHNIQUE: Multidetector CT imaging through the chest, abdomen and pelvis was performed using the standard protocol during bolus administration of intravenous contrast. Multiplanar reconstructed images and MIPs were obtained and reviewed to evaluate the vascular anatomy. CONTRAST:  100 mL Omni 350 COMPARISON:  None. FINDINGS: CTA CHEST FINDINGS Mediastinum: Unremarkable CT appearance of the thyroid gland for age. No suspicious mediastinal or hilar adenopathy. No soft tissue mediastinal mass. Large sliding hiatal hernia with nearly completely intra thoracic stomach. Heart/Vascular: Cardiomegaly. Patient is status post median sternotomy with evidence of multivessel CABG including LIMA to LAD bypass. No pericardial effusion. Surgical changes of prior mitral valve annuloplasty. No aortic dissection or aneurysm. Heterogeneous atherosclerotic plaque. Tortuous and elongated transverse and descending thoracic aorta. Four vessel arch. The left vertebral artery arises directly from the aorta. Lungs/Pleura: 6 mm ground-glass attenuation nodular opacity in the right lung apex (image 10 series 706). A 4 mm nodule in the periphery of the right upper lobe (image 21 series 706). There is a 3 mm nodule in the periphery of the left upper lobe on image 26. 5 mm nodule in the periphery of the left lower lobe on image 42. There are a few additional technique scattered 2 mm subpleural nodules. Whole dependent atelectasis in the medial aspect of both lower lobes  likely secondary to the large hiatal hernia. Trace scarring versus is bandlike atelectasis in the inferior lingula. Lungs are otherwise clear. No focal consolidation. Bones/Soft Tissues: No acute fracture or aggressive appearing lytic or blastic osseous lesion. Review of the MIP images confirms the above findings. CTA ABDOMEN AND PELVIS FINDINGS VASCULAR Aorta: Mild ectasia of the infrarenal abdominal aorta with a maximal diameter of 2.6 cm. Her scattered atherosclerotic vascular calcifications but no significant stenosis or evidence of dissection. Celiac: Widely patent. Conventional hepatic arterial anatomy. No visceral artery aneurysm. SMA: Atherosclerotic plaque in the mid portion without significant stenosis. No aneurysm. Renals: Single dominant renal arteries without evidence of significant stenosis, aneurysm or fibromuscular dysplasia. IMA: Patent and unremarkable. Inflow: Minimal atherosclerotic plaque without evidence of aneurysm or significant stenosis. Proximal Outflow: Middle atherosclerotic plaque without evidence of aneurysm or significant stenosis. Veins: No focal venous abnormality. NON-VASCULAR Abdomen: Intrathoracic stomach due to very large hiatal hernia. Unremarkable CT appearance of the spleen, adrenal glands and pancreas save for mild prominence of the main pancreatic duct. There is no associated atrophy or mass. Normal hepatic contour and morphology. There are at least 4 subcentimeter circumscribed low-attenuation lesions scattered throughout the liver which are too small to characterize but statistically highly likely benign cysts. The main portal veins are patent. Gallbladder is unremarkable. No intra or extrahepatic biliary ductal dilatation. Unremarkable appearance of the bilateral kidneys. No focal solid lesion, hydronephrosis or nephrolithiasis. Small probable simple cyst upper pole left kidney. Extensive colonic diverticulosis. No evidence of active inflammation. No evidence of bowel  obstruction or focal bowel  wall thickening. Moderate volume of formed stool in the rectum. Pelvis: Surgical changes of prior hysterectomy. The bladder is distended with urine. Streak artifact from right total hip arthroplasty slightly limits evaluation. Bones/Soft Tissues: Stable chronic T7 compression fracture with approximately 60% height loss. Age indeterminate compression fracture of the inferior endplate of L1 with was 20% height loss. Multilevel degenerative disc disease. Generalized demineralization. Review of the MIP images confirms the above findings. IMPRESSION: CTA CHEST 1. No evidence of aortic aneurysm or dissection. 2. Massive hiatal hernia with nearly completely intra thoracic stomach. 3. Atherosclerosis and multivessel coronary artery calcifications. 4. Surgical changes of prior multivessel CABG happened mitral valve annuloplasty. 5. Cardiomegaly. 6. Multiple bilateral pulmonary nodules measuring up to 5 mm. In the absence of a known primary malignancy these likely represent sequelae of prior infection/inflammation or a granulomatous process. Consider follow-up CT scan of the chest in 1 year to confirm stability. 7. Chronic T7 compression fracture. CTA ABD/PELVIS 1. Fusiform ectasia of the infrarenal abdominal aorta with maximum transverse to the 2.6 cm. Ectatic abdominal aorta at risk for aneurysm development. Recommend followup by ultrasound in 5 years. This recommendation follows ACR consensus guidelines: White Paper of the ACR Incidental Findings Committee II on Vascular Findings. J Am Coll Radiol 2013; 10:789-794. 2. Extensive colonic diverticulosis without evidence of active inflammation. 3. Moderate volume retained stool in the rectum suggests an element of constipation. 4. Age indeterminate compression fracture of the inferior endplate of L1. Signed, Criselda Peaches, MD [facility] Radiology Electronically Signed   By: Jacqulynn Cadet  M.D.   On: 12/19/2014 11:34     CBC  Recent Labs Lab 12/19/14 0728 12/20/14 0348 12/21/14 0400  WBC 9.1 10.1 10.4  HGB 13.2 12.0 12.3  HCT 41.1 36.5 37.7  PLT 189 189 203  MCV 89.5 89.9 90.0  MCH 28.8 29.6 29.4  MCHC 32.1 32.9 32.6  RDW 13.7 14.0 13.9    Chemistries   Recent Labs Lab 12/19/14 0728 12/19/14 1934 12/20/14 0348 12/20/14 0930 12/21/14 0400  NA 138  --  137  --  139  K 3.3*  --  4.4  --  4.2  CL 103  --  106  --  104  CO2 24  --  26  --  28  GLUCOSE 133*  --  97  --  122*  BUN 8  --  6  --  12  CREATININE 0.77  --  0.63  --  0.60  CALCIUM 9.4  --  9.0  --  9.7  MG 2.0 2.1  --   --   --   AST 22  --   --  20 19  ALT 10*  --   --  8* 10*  ALKPHOS 88  --   --  73 81  BILITOT 0.5  --   --  0.4 0.4   ------------------------------------------------------------------------------------------------------------------ estimated creatinine clearance is 35.2 mL/min (by C-G formula based on Cr of 0.6). ------------------------------------------------------------------------------------------------------------------ No results for input(s): HGBA1C in the last 72 hours. ------------------------------------------------------------------------------------------------------------------ No results for input(s): CHOL, HDL, LDLCALC, TRIG, CHOLHDL, LDLDIRECT in the last 72 hours. ------------------------------------------------------------------------------------------------------------------  Recent Labs  12/19/14 1934  TSH 1.014   ------------------------------------------------------------------------------------------------------------------ No results for input(s): VITAMINB12, FOLATE, FERRITIN, TIBC, IRON, RETICCTPCT in the last 72 hours.  Coagulation profile  Recent Labs Lab 12/20/14 1420  INR 1.10    No results for input(s): DDIMER in the last 72 hours.  Cardiac Enzymes  Recent Labs  Lab 12/19/14 0728 12/19/14 1934 12/20/14 0348  TROPONINI <0.03  <0.03 <0.03   ------------------------------------------------------------------------------------------------------------------ Invalid input(s): POCBNP   CBG: No results for input(s): GLUCAP in the last 168 hours.     Studies: Mr Lumbar Spine W Wo Contrast  12/20/2014  CLINICAL DATA:  Patient fell in parking lot. Pain radiates to RIGHT hip. EXAM: MRI LUMBAR SPINE WITHOUT AND WITH CONTRAST TECHNIQUE: Multiplanar and multiecho pulse sequences of the lumbar spine were obtained without and with intravenous contrast. CONTRAST:  34mL MULTIHANCE GADOBENATE DIMEGLUMINE 529 MG/ML IV SOLN COMPARISON:  Lumbar spine radiograph 12/17/2014. CTA chest and abdomen 12/19/2014. FINDINGS: Segmentation: Normal. Alignment:  Normal except for trace retrolisthesis L1-2. Vertebrae: There is an acute compression fracture of L1, primarily inferior endplate deformity, but also rightward anterior inferior chip fractures, with edema confined to the lower 1/2 of the vertebral body, only minor wedging, and prevertebral soft tissue swelling. As demonstrated to greater detail on CT axial coronal images, the lateral inferior aspect of the vertebral body which is associated with osseous ridging, has fractured on its RIGHT side, and is displaced laterally. Tiny corner fracture anteriorly superiorly at L2, minor compared to the edema throughout L1. No significant retropulsion or epidural hematoma.No other worrisome osseous lesions. Conus medullaris: Normal in size, and signal. Trace low termination. Paraspinal tissues: No evidence for hydronephrosis. Renal cystic disease, incompletely evaluated. Abdominal aortic ectasia up to 28 mm diameter. Ectatic abdominal aorta at risk for aneurysm development. Recommend followup by ultrasound in 5 years. This recommendation follows ACR consensus guidelines: White Paper of the ACR Incidental Findings Committee II on Vascular Findings. J Am Coll Radiol 2013; 10:789-794. Disc levels: L1-L2: There is  mild annular bulging/subligamentous protrusion, and disc osteophyte complex, eccentric to the RIGHT. No definite subarticular zone narrowing or epidural hematoma but asymmetric RIGHT-sided foraminal narrowing likely irritates the RIGHT L1 nerve root. In addition the RIGHT L1 nerve root is likely displace in its extraforaminal course due to fractures of the RIGHT lateral inferior aspect of that vertebral body. See coronal CTA image 98 series 702, and axial CTA images 141-145 series 701. Pedicles are intact. L2-L3:  Tiny annular rent.  No impingement. L3-L4:  Unremarkable. L4-L5:  Unremarkable. L5-S1:  Unremarkable. Compared with plain films, the fracture is difficult to see due to osteopenia IMPRESSION: Acute compression fracture of L1, consistent with a posttraumatic osteopenic (nonpathologic) injury, with bone marrow edema primarily confined to the inferior 1/2 of the vertebral body. Inferior endplate deformity, with anterior chip fracture. Anterior prevertebral hematoma. The patient's RIGHT hip pain may relate to asymmetric foraminal narrowing at L1-2 on the RIGHT due to disc osteophyte complex as well as displacement of the extraforaminal nerve alongside the injured lateral inferior aspect of L1. See discussion above. Tiny anterior superior corner injury to L2, without wedging. The patient may be a candidate for vertebral augmentation if conservative measures do not result in improvement. Electronically Signed   By: Staci Righter M.D.   On: 12/20/2014 13:38      Lab Results  Component Value Date   HGBA1C 5.9* 10/08/2011   Lab Results  Component Value Date   LDLCALC 102* 11/16/2013   CREATININE 0.60 12/21/2014       Scheduled Meds: . docusate sodium  100 mg Oral BID  . enoxaparin (LOVENOX) injection  30 mg Subcutaneous Q24H  . feeding supplement (ENSURE ENLIVE)  237 mL Oral BID BM  . lisinopril  5 mg Oral Daily  . methylPREDNISolone (SOLU-MEDROL) injection  40 mg Intravenous Daily  .  potassium chloride  40 mEq Oral Once  . sodium chloride  3 mL Intravenous Q12H   Continuous Infusions:    Principal Problem:   Bradycardia Active Problems:   Mitral regurgitation   Essential hypertension, benign   Coronary artery disease   Congestive heart failure (HCC)   HLD (hyperlipidemia)   Intractable low back pain   Pancreatitis   Compression fracture   Hypokalemia   Lung nodule   Aortic aneurysm (HCC)   Lumbar compression fracture (HCC)   Protein-calorie malnutrition, severe    Time spent: 45 minutes   [facility] Pager [phone]. If 7PM-7AM, please contact night-coverage at [email or web address], password [facility] 12/22/2014, 11:17 AM  LOS: 3 days

## 2014-12-22 NOTE — Progress Notes (Signed)
OT Cancellation Note  Patient Details Name: Victoria Sexton MRN: [id number] DOB: [birth year]   Cancelled Treatment:    Reason Eval/Treat Not Completed: Patient not medically ready - Pt for vertebroplasty tomorrow, per chart.  Will check back tomorrow. Darlina Rumpf [name], OTR/L [id number]  12/22/2014, 12:39 PM

## 2014-12-23 ENCOUNTER — Telehealth (HOSPITAL_COMMUNITY): Payer: Self-pay | Admitting: Interventional Radiology

## 2014-12-23 LAB — URINALYSIS, ROUTINE W REFLEX MICROSCOPIC
BILIRUBIN URINE: NEGATIVE
GLUCOSE, UA: NEGATIVE mg/dL
Hgb urine dipstick: NEGATIVE
KETONES UR: 15 mg/dL — AB
LEUKOCYTES UA: NEGATIVE
NITRITE: NEGATIVE
PROTEIN: NEGATIVE mg/dL
SPECIFIC GRAVITY, URINE: 1.018 (ref 1.005–1.030)
pH: 7 (ref 5.0–8.0)

## 2014-12-23 LAB — CBC
HCT: 42.6 % (ref 36.0–46.0)
Hemoglobin: 13.6 g/dL (ref 12.0–15.0)
MCH: 28.9 pg (ref 26.0–34.0)
MCHC: 31.9 g/dL (ref 30.0–36.0)
MCV: 90.6 fL (ref 78.0–100.0)
PLATELETS: 228 10*3/uL (ref 150–400)
RBC: 4.7 MIL/uL (ref 3.87–5.11)
RDW: 14.2 % (ref 11.5–15.5)
WBC: 13.1 10*3/uL — AB (ref 4.0–10.5)

## 2014-12-23 LAB — COMPREHENSIVE METABOLIC PANEL
ALT: 14 U/L (ref 14–54)
ANION GAP: 11 (ref 5–15)
AST: 30 U/L (ref 15–41)
Albumin: 3.4 g/dL — ABNORMAL LOW (ref 3.5–5.0)
Alkaline Phosphatase: 97 U/L (ref 38–126)
BILIRUBIN TOTAL: 0.5 mg/dL (ref 0.3–1.2)
BUN: 15 mg/dL (ref 6–20)
CHLORIDE: 101 mmol/L (ref 101–111)
CO2: 28 mmol/L (ref 22–32)
Calcium: 9.5 mg/dL (ref 8.9–10.3)
Creatinine, Ser: 0.68 mg/dL (ref 0.44–1.00)
Glucose, Bld: 103 mg/dL — ABNORMAL HIGH (ref 65–99)
POTASSIUM: 3.8 mmol/L (ref 3.5–5.1)
Sodium: 140 mmol/L (ref 135–145)
TOTAL PROTEIN: 6.7 g/dL (ref 6.5–8.1)

## 2014-12-23 MED ORDER — ENSURE ENLIVE PO LIQD: 237.0000 mL | Freq: Two times a day (BID) | ORAL | Status: DC

## 2014-12-23 MED ORDER — LISINOPRIL 10 MG PO TABS
10.0000 mg | ORAL_TABLET | Freq: Every day | ORAL | Status: DC
  Administered 2014-12-23 – 2014-12-25 (×2): 10 mg via ORAL
  Filled 2014-12-23 (×2): qty 1

## 2014-12-23 MED ORDER — METHOCARBAMOL 500 MG PO TABS: 500.0000 mg | ORAL_TABLET | Freq: Four times a day (QID) | ORAL | Status: DC | PRN

## 2014-12-23 MED ORDER — HYDROCODONE-ACETAMINOPHEN 5-325 MG PO TABS: 1.0000 | ORAL_TABLET | ORAL | Status: DC | PRN

## 2014-12-23 MED ORDER — METOPROLOL TARTRATE 12.5 MG HALF TABLET
12.5000 mg | ORAL_TABLET | Freq: Once | ORAL | Status: AC
  Administered 2014-12-23: 12.5 mg via ORAL
  Filled 2014-12-23: qty 1

## 2014-12-23 MED ORDER — PREDNISONE 20 MG PO TABS
40.0000 mg | ORAL_TABLET | Freq: Every day | ORAL | Status: AC
  Administered 2014-12-23: 40 mg via ORAL
  Filled 2014-12-23: qty 2

## 2014-12-23 MED ORDER — DOCUSATE SODIUM 100 MG PO CAPS: 100.0000 mg | ORAL_CAPSULE | Freq: Two times a day (BID) | ORAL | Status: DC

## 2014-12-23 NOTE — Progress Notes (Signed)
UR Completed. Karlita Lichtman, RN, BSN.  [phone] 

## 2014-12-23 NOTE — Progress Notes (Signed)
Patient ID: Victoria Sexton, female   DOB: 26-Mar-1927, 81 y.o.   MRN: [id number] Awaiting insurance authorization before kyphoplasty can be performed.

## 2014-12-23 NOTE — Progress Notes (Addendum)
[facility] PROGRESS NOTE  Victoria Sexton [id number] DOB: Jul 31, 1927 DOA: 12/19/2014 PCP: Victoria Schwalbe, MD  Length of stay: 4   Assessment/Plan: Principal Problem:   Bradycardia Active Problems:   Mitral regurgitation   Essential hypertension, benign   Coronary artery disease   Congestive heart failure (HCC)   HLD (hyperlipidemia)   Intractable low back pain   Pancreatitis   Compression fracture   Hypokalemia   Lung nodule   Aortic aneurysm (HCC)   Lumbar compression fracture (HCC)   Protein-calorie malnutrition, severe   History of present illness 81 y.o. female with PMH of dCHF, CAD, ischemic heart disease status post CABG 2013, history of mitral valve repair and angioplasty since emergency department with one-week history of intractable back pain.  -admitted with L1 compression fracture, symptomatic bradycardia, pancreatitis.   Assessment and plan 1. Bradycardia. She was having pauses up to 4 seconds and the next beat would be junctional, not sinus bradycardia would precede the nausea. Likely Vagal.  resolved off of Toprol (50mg  QD). Cardiac enzymes negative Normal TSH No indication for pacer at this time as per cardiology - OK to proceed with Kyphoplasty     #2 probable pancreatitis. Lipase 296, now 41, no clinical signs of acute pancreatitis, nausea resolved CT with mild prominence of main pancreatic duct -Denies abdominal pain, no nausea or vomiting, no EtOH use, no transaminitis Continue soft diet   #3. Intractable back pain. Likely related to compression fracture at L1 TLSO brace, MRI of the lumbar spine,IR consultation for  vertebroplasty,  npo after midnight for procedure pending insurance auth Continue Robaxin and discontinue IV steroids , switched to by mouth prednisone Ambulate with PT when pain is better controlled  Vitamin D levels okay  #4. Hypertension -Controlled in the emergency department -Hold home beta blocker -Continue ACE  inhibitor, increased dose of lisinopril to 10 mg  #5. Hypokalemia.Resolved  #6. CAD. No chest pain. Initial troponin at baseline. History of ischemic heart disease status post CABG 2013 as well as mitral valve repair angioplasty 2013 Stable, cardiology following -Last echo last year with the EF of 60%  #7.  chronic Diastolic heart failureWithout exacerbation -Appears euvolemic -We'll monitor intake and output obtain daily weights  -EF last year 60%  #8. Aortic aneurysm. Incidental finding on imaging done outpatient. Today CT   negative for aortic aneurysm dissection -Patient instructed to schedule ultrasound but has not done this yet -Outpatient follow-up   #9. Pulmonary nodules per CT -Incidental findings Will need outpatient follow-up   DVT prophylaxsis Lovenox, hold for procedure past midnight   Code Status:      Code Status Orders        Start     Ordered   12/19/14 1811  Full code   Continuous     12/19/14 1811      Family Communication: Discussed in detail with the patient, all imaging results, lab results explained to the patient   Disposition Plan:  Interventional radiology procedure pending      Brief narrative: Victoria Sexton is a pleasant 81 y.o. female with a past medical history that includes diastolic heart failure, CAD, ischemic heart disease status post CABG 2013, history of mitral valve repair and angioplasty since emergency department with one-week history of intractable back pain. Initial evaluation in the emergency department reveals L1 compression fracture age-indeterminate, symptomatic bradycardia, pancreatitis.  She reports chronic back pain since her fall in April however the last week lower back  pain worsening. He denies any fall or muscle strain. He reports the pain is constant sharp located intermittent low back radiating to both hips. She denies any numbness or tingling of her lower extremities. She reports being "steady on my feet" not needing  a walker or cane she reports no falls since April and her husband who is at the bedside confirms. She was seen by PCP 2 days ago and had outpatient imaging revealed no bony abnormalities. While in the emergency department she got up to the bedside commode heart rate dropped to 40 she became slightly diaphoretic and complained of dizziness and nausea". A similar episode while lying in the bed heart rate went to 42. She reports no chest pain palpitations fever chills recent sick contacts. She reports no abdominal pain nausea vomiting diarrhea. She reports a tendency towards constipation last bowel movement 2 days ago. Reports decreased oral intake denies unintentional weight loss  In the emergency department she is afebrile with a blood pressure 145/59. Heart rate range is 40 -67. She is not hypoxic   Consultants:  Cardiology  Procedures:  None  Antibiotics: Anti-infectives    None         HPI/Subjective: Husband by the bedside, and she is to get the procedure done, not sure if the patient would like to go home today  Objective: Filed Vitals:   12/22/14 0342 12/22/14 1454 12/22/14 2032 12/23/14 0513  BP: 163/78 109/64 179/61 164/74  Pulse: 79 92 80 85  Temp: 97.8 F (36.6 C) 98.2 F (36.8 C) 98.3 F (36.8 C) 97.6 F (36.4 C)  TempSrc: Oral Oral Oral Oral  Resp: 18 19 19 18   Height:      Weight: 43.364 kg (95 lb 9.6 oz)     SpO2: 96% 97% 97% 97%    Intake/Output Summary (Last 24 hours) at 12/23/14 0954 Last data filed at 12/23/14 [id number]  Gross per 24 hour  Intake      0 ml  Output    700 ml  Net   -700 ml    Exam:  General: Very uncomfortable because of low back pain Lungs: Clear to auscultation bilaterally without wheezes or crackles Cardiovascular: Regular rate and rhythm without murmur gallop or rub normal S1 and S2 Abdomen: Nontender, nondistended, soft, bowel sounds positive, no rebound, no ascites, no appreciable mass Extremities: No significant cyanosis,  clubbing, or edema bilateral lower extremities, increased paraspinal muscle spasm     Data Review   Micro Results No results found for this or any previous visit (from the past 240 hour(s)).  Radiology Reports Dg Lumbar Spine 2-3 Views  12/17/2014  CLINICAL DATA:  Low back pain. EXAM: LUMBAR SPINE - 2-3 VIEW COMPARISON:  11/15/2011. FINDINGS: Thoracolumbar spine degenerative change and scoliosis. No acute abnormality identified. Diffuse osteopenia. 3.3 cm abdominal aortic aneurysm noted. Aortic ultrasound can be obtained for further evaluation. Sliding hiatal hernia. Prior cardiac valve replacement. IMPRESSION: 1. Diffuse osteopenia degenerative change with scoliosis concave right. 2. 3.3 cm abdominal aortic aneurysm. Aortic ultrasound can be obtained for further evaluation. 3. Sliding hiatal hernia. Electronically Signed   By: Marcello Moores  Register   On: 12/17/2014 14:56   Dg Pelvis 1-2 Views  12/17/2014  CLINICAL DATA:  Low back pain with bilateral sciatica for 1 week. No reported injury. EXAM: PELVIS - 1-2 VIEW COMPARISON:  April 17, 2014. FINDINGS: Status post right hemiarthroplasty. Prosthesis appears well situated. No fracture or dislocation is noted. Left hip appears normal. IMPRESSION: No acute abnormality seen.  Electronically Signed   By: Marijo Conception, M.D.   On: 12/17/2014 14:56   Mr Lumbar Spine W Wo Contrast  12/20/2014  CLINICAL DATA:  Patient fell in parking lot. Pain radiates to RIGHT hip. EXAM: MRI LUMBAR SPINE WITHOUT AND WITH CONTRAST TECHNIQUE: Multiplanar and multiecho pulse sequences of the lumbar spine were obtained without and with intravenous contrast. CONTRAST:  51mL MULTIHANCE GADOBENATE DIMEGLUMINE 529 MG/ML IV SOLN COMPARISON:  Lumbar spine radiograph 12/17/2014. CTA chest and abdomen 12/19/2014. FINDINGS: Segmentation: Normal. Alignment:  Normal except for trace retrolisthesis L1-2. Vertebrae: There is an acute compression fracture of L1, primarily inferior endplate  deformity, but also rightward anterior inferior chip fractures, with edema confined to the lower 1/2 of the vertebral body, only minor wedging, and prevertebral soft tissue swelling. As demonstrated to greater detail on CT axial coronal images, the lateral inferior aspect of the vertebral body which is associated with osseous ridging, has fractured on its RIGHT side, and is displaced laterally. Tiny corner fracture anteriorly superiorly at L2, minor compared to the edema throughout L1. No significant retropulsion or epidural hematoma.No other worrisome osseous lesions. Conus medullaris: Normal in size, and signal. Trace low termination. Paraspinal tissues: No evidence for hydronephrosis. Renal cystic disease, incompletely evaluated. Abdominal aortic ectasia up to 28 mm diameter. Ectatic abdominal aorta at risk for aneurysm development. Recommend followup by ultrasound in 5 years. This recommendation follows ACR consensus guidelines: White Paper of the ACR Incidental Findings Committee II on Vascular Findings. J Am Coll Radiol 2013; 10:789-794. Disc levels: L1-L2: There is mild annular bulging/subligamentous protrusion, and disc osteophyte complex, eccentric to the RIGHT. No definite subarticular zone narrowing or epidural hematoma but asymmetric RIGHT-sided foraminal narrowing likely irritates the RIGHT L1 nerve root. In addition the RIGHT L1 nerve root is likely displace in its extraforaminal course due to fractures of the RIGHT lateral inferior aspect of that vertebral body. See coronal CTA image 98 series 702, and axial CTA images 141-145 series 701. Pedicles are intact. L2-L3:  Tiny annular rent.  No impingement. L3-L4:  Unremarkable. L4-L5:  Unremarkable. L5-S1:  Unremarkable. Compared with plain films, the fracture is difficult to see due to osteopenia IMPRESSION: Acute compression fracture of L1, consistent with a posttraumatic osteopenic (nonpathologic) injury, with bone marrow edema primarily confined to  the inferior 1/2 of the vertebral body. Inferior endplate deformity, with anterior chip fracture. Anterior prevertebral hematoma. The patient's RIGHT hip pain may relate to asymmetric foraminal narrowing at L1-2 on the RIGHT due to disc osteophyte complex as well as displacement of the extraforaminal nerve alongside the injured lateral inferior aspect of L1. See discussion above. Tiny anterior superior corner injury to L2, without wedging. The patient may be a candidate for vertebral augmentation if conservative measures do not result in improvement. Electronically Signed   By: Staci Righter M.D.   On: 12/20/2014 13:38   Ct Angio Chest Aorta W/cm &/or Wo/cm  12/19/2014  CLINICAL DATA:  81 year old female with abdominal aortic aneurysm and flank pain EXAM: CT ANGIOGRAPHY CHEST, ABDOMEN AND PELVIS TECHNIQUE: Multidetector CT imaging through the chest, abdomen and pelvis was performed using the standard protocol during bolus administration of intravenous contrast. Multiplanar reconstructed images and MIPs were obtained and reviewed to evaluate the vascular anatomy. CONTRAST:  100 mL Omni 350 COMPARISON:  None. FINDINGS: CTA CHEST FINDINGS Mediastinum: Unremarkable CT appearance of the thyroid gland for age. No suspicious mediastinal or hilar adenopathy. No soft tissue mediastinal mass. Large sliding hiatal hernia with nearly completely intra  thoracic stomach. Heart/Vascular: Cardiomegaly. Patient is status post median sternotomy with evidence of multivessel CABG including LIMA to LAD bypass. No pericardial effusion. Surgical changes of prior mitral valve annuloplasty. No aortic dissection or aneurysm. Heterogeneous atherosclerotic plaque. Tortuous and elongated transverse and descending thoracic aorta. Four vessel arch. The left vertebral artery arises directly from the aorta. Lungs/Pleura: 6 mm ground-glass attenuation nodular opacity in the right lung apex (image 10 series 706). A 4 mm nodule in the periphery of  the right upper lobe (image 21 series 706). There is a 3 mm nodule in the periphery of the left upper lobe on image 26. 5 mm nodule in the periphery of the left lower lobe on image 42. There are a few additional technique scattered 2 mm subpleural nodules. Whole dependent atelectasis in the medial aspect of both lower lobes likely secondary to the large hiatal hernia. Trace scarring versus is bandlike atelectasis in the inferior lingula. Lungs are otherwise clear. No focal consolidation. Bones/Soft Tissues: No acute fracture or aggressive appearing lytic or blastic osseous lesion. Review of the MIP images confirms the above findings. CTA ABDOMEN AND PELVIS FINDINGS VASCULAR Aorta: Mild ectasia of the infrarenal abdominal aorta with a maximal diameter of 2.6 cm. Her scattered atherosclerotic vascular calcifications but no significant stenosis or evidence of dissection. Celiac: Widely patent. Conventional hepatic arterial anatomy. No visceral artery aneurysm. SMA: Atherosclerotic plaque in the mid portion without significant stenosis. No aneurysm. Renals: Single dominant renal arteries without evidence of significant stenosis, aneurysm or fibromuscular dysplasia. IMA: Patent and unremarkable. Inflow: Minimal atherosclerotic plaque without evidence of aneurysm or significant stenosis. Proximal Outflow: Middle atherosclerotic plaque without evidence of aneurysm or significant stenosis. Veins: No focal venous abnormality. NON-VASCULAR Abdomen: Intrathoracic stomach due to very large hiatal hernia. Unremarkable CT appearance of the spleen, adrenal glands and pancreas save for mild prominence of the main pancreatic duct. There is no associated atrophy or mass. Normal hepatic contour and morphology. There are at least 4 subcentimeter circumscribed low-attenuation lesions scattered throughout the liver which are too small to characterize but statistically highly likely benign cysts. The main portal veins are patent.  Gallbladder is unremarkable. No intra or extrahepatic biliary ductal dilatation. Unremarkable appearance of the bilateral kidneys. No focal solid lesion, hydronephrosis or nephrolithiasis. Small probable simple cyst upper pole left kidney. Extensive colonic diverticulosis. No evidence of active inflammation. No evidence of bowel obstruction or focal bowel wall thickening. Moderate volume of formed stool in the rectum. Pelvis: Surgical changes of prior hysterectomy. The bladder is distended with urine. Streak artifact from right total hip arthroplasty slightly limits evaluation. Bones/Soft Tissues: Stable chronic T7 compression fracture with approximately 60% height loss. Age indeterminate compression fracture of the inferior endplate of L1 with was 20% height loss. Multilevel degenerative disc disease. Generalized demineralization. Review of the MIP images confirms the above findings. IMPRESSION: CTA CHEST 1. No evidence of aortic aneurysm or dissection. 2. Massive hiatal hernia with nearly completely intra thoracic stomach. 3. Atherosclerosis and multivessel coronary artery calcifications. 4. Surgical changes of prior multivessel CABG happened mitral valve annuloplasty. 5. Cardiomegaly. 6. Multiple bilateral pulmonary nodules measuring up to 5 mm. In the absence of a known primary malignancy these likely represent sequelae of prior infection/inflammation or a granulomatous process. Consider follow-up CT scan of the chest in 1 year to confirm stability. 7. Chronic T7 compression fracture. CTA ABD/PELVIS 1. Fusiform ectasia of the infrarenal abdominal aorta with maximum transverse to the 2.6 cm. Ectatic abdominal aorta at risk for aneurysm development.  Recommend followup by ultrasound in 5 years. This recommendation follows ACR consensus guidelines: White Paper of the ACR Incidental Findings Committee II on Vascular Findings. J Am Coll Radiol 2013; 10:789-794. 2. Extensive colonic diverticulosis without evidence of  active inflammation. 3. Moderate volume retained stool in the rectum suggests an element of constipation. 4. Age indeterminate compression fracture of the inferior endplate of L1. Signed, Criselda Peaches, MD [facility] Radiology Electronically Signed   By: Jacqulynn Cadet M.D.   On: 12/19/2014 11:34   Ct Angio Abd/pel W/ And/or W/o  12/19/2014  CLINICAL DATA:  81 year old female with abdominal aortic aneurysm and flank pain EXAM: CT ANGIOGRAPHY CHEST, ABDOMEN AND PELVIS TECHNIQUE: Multidetector CT imaging through the chest, abdomen and pelvis was performed using the standard protocol during bolus administration of intravenous contrast. Multiplanar reconstructed images and MIPs were obtained and reviewed to evaluate the vascular anatomy. CONTRAST:  100 mL Omni 350 COMPARISON:  None. FINDINGS: CTA CHEST FINDINGS Mediastinum: Unremarkable CT appearance of the thyroid gland for age. No suspicious mediastinal or hilar adenopathy. No soft tissue mediastinal mass. Large sliding hiatal hernia with nearly completely intra thoracic stomach. Heart/Vascular: Cardiomegaly. Patient is status post median sternotomy with evidence of multivessel CABG including LIMA to LAD bypass. No pericardial effusion. Surgical changes of prior mitral valve annuloplasty. No aortic dissection or aneurysm. Heterogeneous atherosclerotic plaque. Tortuous and elongated transverse and descending thoracic aorta. Four vessel arch. The left vertebral artery arises directly from the aorta. Lungs/Pleura: 6 mm ground-glass attenuation nodular opacity in the right lung apex (image 10 series 706). A 4 mm nodule in the periphery of the right upper lobe (image 21 series 706). There is a 3 mm nodule in the periphery of the left upper lobe on image 26. 5 mm nodule in the periphery of the left lower lobe on image 42. There are a few additional technique scattered 2 mm subpleural nodules. Whole dependent  atelectasis in the medial aspect of both lower lobes likely secondary to the large hiatal hernia. Trace scarring versus is bandlike atelectasis in the inferior lingula. Lungs are otherwise clear. No focal consolidation. Bones/Soft Tissues: No acute fracture or aggressive appearing lytic or blastic osseous lesion. Review of the MIP images confirms the above findings. CTA ABDOMEN AND PELVIS FINDINGS VASCULAR Aorta: Mild ectasia of the infrarenal abdominal aorta with a maximal diameter of 2.6 cm. Her scattered atherosclerotic vascular calcifications but no significant stenosis or evidence of dissection. Celiac: Widely patent. Conventional hepatic arterial anatomy. No visceral artery aneurysm. SMA: Atherosclerotic plaque in the mid portion without significant stenosis. No aneurysm. Renals: Single dominant renal arteries without evidence of significant stenosis, aneurysm or fibromuscular dysplasia. IMA: Patent and unremarkable. Inflow: Minimal atherosclerotic plaque without evidence of aneurysm or significant stenosis. Proximal Outflow: Middle atherosclerotic plaque without evidence of aneurysm or significant stenosis. Veins: No focal venous abnormality. NON-VASCULAR Abdomen: Intrathoracic stomach due to very large hiatal hernia. Unremarkable CT appearance of the spleen, adrenal glands and pancreas save for mild prominence of the main pancreatic duct. There is no associated atrophy or mass. Normal hepatic contour and morphology. There are at least 4 subcentimeter circumscribed low-attenuation lesions scattered throughout the liver which are too small to characterize but statistically highly likely benign cysts. The main portal veins are patent. Gallbladder is unremarkable. No intra or extrahepatic biliary ductal dilatation. Unremarkable appearance of the bilateral kidneys. No focal solid lesion, hydronephrosis or nephrolithiasis. Small probable simple cyst upper pole left kidney. Extensive colonic diverticulosis. No  evidence of active inflammation. No evidence of bowel obstruction or focal bowel wall thickening. Moderate volume of formed stool in the rectum. Pelvis: Surgical changes of prior hysterectomy. The bladder is distended with urine. Streak artifact from right total hip arthroplasty slightly limits evaluation. Bones/Soft Tissues: Stable chronic T7 compression fracture with approximately 60% height loss. Age indeterminate compression fracture of the inferior endplate of L1 with was 20% height loss. Multilevel degenerative disc disease. Generalized demineralization. Review of the MIP images confirms the above findings. IMPRESSION: CTA CHEST 1. No evidence of aortic aneurysm or dissection. 2. Massive hiatal hernia with nearly completely intra thoracic stomach. 3. Atherosclerosis and multivessel coronary artery calcifications. 4. Surgical changes of prior multivessel CABG happened mitral valve annuloplasty. 5. Cardiomegaly. 6. Multiple bilateral pulmonary nodules measuring up to 5 mm. In the absence of a known primary malignancy these likely represent sequelae of prior infection/inflammation or a granulomatous process. Consider follow-up CT scan of the chest in 1 year to confirm stability. 7. Chronic T7 compression fracture. CTA ABD/PELVIS 1. Fusiform ectasia of the infrarenal abdominal aorta with maximum transverse to the 2.6 cm. Ectatic abdominal aorta at risk for aneurysm development. Recommend followup by ultrasound in 5 years. This recommendation follows ACR consensus guidelines: White Paper of the ACR Incidental Findings Committee II on Vascular Findings. J Am Coll Radiol 2013; 10:789-794. 2. Extensive colonic diverticulosis without evidence of active inflammation. 3. Moderate volume retained stool in the rectum suggests an element of constipation. 4. Age indeterminate compression fracture of the inferior endplate of L1. Signed, Criselda Peaches, MD [facility]  Radiology Electronically Signed   By: Jacqulynn Cadet M.D.   On: 12/19/2014 11:34     CBC  Recent Labs Lab 12/19/14 0728 12/20/14 0348 12/21/14 0400 12/23/14 0527  WBC 9.1 10.1 10.4 13.1*  HGB 13.2 12.0 12.3 13.6  HCT 41.1 36.5 37.7 42.6  PLT 189 189 203 228  MCV 89.5 89.9 90.0 90.6  MCH 28.8 29.6 29.4 28.9  MCHC 32.1 32.9 32.6 31.9  RDW 13.7 14.0 13.9 14.2    Chemistries   Recent Labs Lab 12/19/14 0728 12/19/14 1934 12/20/14 0348 12/20/14 0930 12/21/14 0400 12/23/14 0527  NA 138  --  137  --  139 140  K 3.3*  --  4.4  --  4.2 3.8  CL 103  --  106  --  104 101  CO2 24  --  26  --  28 28  GLUCOSE 133*  --  97  --  122* 103*  BUN 8  --  6  --  12 15  CREATININE 0.77  --  0.63  --  0.60 0.68  CALCIUM 9.4  --  9.0  --  9.7 9.5  MG 2.0 2.1  --   --   --   --   AST 22  --   --  20 19 30   ALT 10*  --   --  8* 10* 14  ALKPHOS 88  --   --  73 81 97  BILITOT 0.5  --   --  0.4 0.4 0.5   ------------------------------------------------------------------------------------------------------------------ estimated creatinine clearance is 35.2 mL/min (by C-G formula based on Cr of 0.68). ------------------------------------------------------------------------------------------------------------------ No results for input(s): HGBA1C in the last 72 hours. ------------------------------------------------------------------------------------------------------------------ No results for input(s): CHOL, HDL, LDLCALC, TRIG, CHOLHDL, LDLDIRECT in the last 72 hours. ------------------------------------------------------------------------------------------------------------------ No results for input(s): TSH, T4TOTAL, T3FREE, THYROIDAB in the last 72 hours.  Invalid input(s): FREET3 ------------------------------------------------------------------------------------------------------------------ No  results for input(s): VITAMINB12, FOLATE, FERRITIN, TIBC, IRON, RETICCTPCT in the last 72  hours.  Coagulation profile  Recent Labs Lab 12/20/14 1420  INR 1.10    No results for input(s): DDIMER in the last 72 hours.  Cardiac Enzymes  Recent Labs Lab 12/19/14 0728 12/19/14 1934 12/20/14 0348  TROPONINI <0.03 <0.03 <0.03   ------------------------------------------------------------------------------------------------------------------ Invalid input(s): POCBNP   CBG: No results for input(s): GLUCAP in the last 168 hours.     Studies: No results found.    Lab Results  Component Value Date   HGBA1C 5.9* 10/08/2011   Lab Results  Component Value Date   LDLCALC 102* 11/16/2013   CREATININE 0.68 12/23/2014       Scheduled Meds: . docusate sodium  100 mg Oral BID  . enoxaparin (LOVENOX) injection  30 mg Subcutaneous Q24H  . feeding supplement (ENSURE ENLIVE)  237 mL Oral BID BM  . lisinopril  5 mg Oral Daily  . methylPREDNISolone (SOLU-MEDROL) injection  40 mg Intravenous Daily  . potassium chloride  40 mEq Oral Once  . sodium chloride  3 mL Intravenous Q12H   Continuous Infusions:    Principal Problem:   Bradycardia Active Problems:   Mitral regurgitation   Essential hypertension, benign   Coronary artery disease   Congestive heart failure (HCC)   HLD (hyperlipidemia)   Intractable low back pain   Pancreatitis   Compression fracture   Hypokalemia   Lung nodule   Aortic aneurysm (HCC)   Lumbar compression fracture (HCC)   Protein-calorie malnutrition, severe    Time spent: 45 minutes   [facility] Pager [phone]. If 7PM-7AM, please contact night-coverage at [email or web address], password [facility] 12/23/2014, 9:54 AM  LOS: 4 days

## 2014-12-23 NOTE — Progress Notes (Signed)
OT Cancellation Note  Patient Details Name: Victoria Sexton MRN: [id number] DOB: [birth year]   Cancelled Treatment:    Reason Eval/Treat Not Completed: Patient not medically ready. Pt planned to have vertebroplasty today. Will plan to evaluate after surgery.  Benito Mccreedy OTR/L [id number] 12/23/2014, 10:18 AM

## 2014-12-23 NOTE — Plan of Care (Signed)
Problem: Activity: Goal: Risk for activity intolerance will decrease Outcome: Not Progressing Pt was not able to do PT on Sunday.

## 2014-12-23 NOTE — Progress Notes (Addendum)
Patient ID: Victoria Sexton, female   DOB: [birth year], 81 y.o.   MRN: [id number] Still awaiting word from pt's insurance regarding approval for KP. This issue has been d/w pt/husband in great detail. Hopefully we will have decision by tomorrow and proceed with case if approved. Pt's husband has stated that he will pay out of pocket if insurance denies coverage but cost for KP can be significant (10-20k). WBC also 13k today. Will check UA and repeat CBC in am.

## 2014-12-23 NOTE — Progress Notes (Signed)
CSW called for potential SNF placement pending pt ability to get back surgery.  CSW met with pt and pt husband at bedside- pt husband became very upset/angry with CSW.  Feels strongly that this lack of insurance approval is the hospitals fault and does NOT want the pt moved from the hospital till this is straightened out.  Husband is not agreeable to SNF placement while awaiting auth for surgery.  CSW signing off.  Domenica Reamer, [place] Social Worker [phone]

## 2014-12-23 NOTE — Care Management Note (Addendum)
Case Management Note  Patient Details  Name: Victoria Sexton MRN: [id number] Date of Birth: 1927-03-20  Subjective/Objective:                    Action/Plan:  Pt is from home with husband.  Pt is awaiting approval from insurance for scheduled procedure,    Expected Discharge Date:                  Expected Discharge Plan:  [place]  In-House Referral:  Clinical Social Work  Discharge planning Services  CM Consult  Post Acute Care Choice:    Choice offered to:     DME Arranged:    DME Agency:     HH Arranged:    [place] Agency:     Status of Service:  In process, will continue to follow  Medicare Important Message Given:  Yes Date Medicare IM Given:    Medicare IM give by:    Date Additional Medicare IM Given:    Additional Medicare Important Message give by:     If discussed at [place] of Stay Meetings, dates discussed:    Additional Comments: CM assessed pt with husband at bedside.  While pt remained calm and cooperative; Pts husband communicated that he was very frustrated in the delay Kyphoplasty procedure, awaiting insurance approval.  CM, bedside nurse, unit charge nurse and director contacted IR to inquire of approval process/delay in procedure.  CM contacted Physician Advisor to discuss delay and discharge planning.  CM also spoke with attending and IR PA regarding delay and possibility of discharge while awaiting for insurance approval due to lack of intensity.  During the conversation with pt regarding possible discharge awaiting insurance approval;  Pts husband stated he would not be able to take pt home in current condition awaiting insurance approval due to lack of mobility and pain. Pt is now on PO PRN pain medication.   CM consulted with CSW regarding possible SNF consult post PT reevaluation in the am of 12/24/14; however pts husband adamantly refused SNF placement.    Plan agreed to by MD; IR PA, Medical Advisor,pt and husband witnessed by  director at pts bedside:  Pt will be evaluated early am 12/24/14 by in house therapy to determine safe discharge plan to be executed on 12/24/14 if insurance approval is not been provided by mid day on 12/24/14.  Again, pt remained calm and  asked husband to calm down, pt stated "he is just hard headed".  Maryclare Labrador, RN 12/23/2014, 5:26 PM

## 2014-12-23 NOTE — Discharge Summary (Signed)
Physician Discharge Summary  Gladys W Barbuto MRN: [id number] DOB/AGE: 07/22/1927 81 y.o.  PCP: WHITE,CYNTHIA S, MD   Admit date: 12/19/2014 Discharge date: 12/24/2014  Discharge Diagnoses:     Principal Problem:   Bradycardia Active Problems:   Mitral regurgitation   Essential hypertension, benign   Coronary artery disease   Congestive heart failure (HCC)   HLD (hyperlipidemia)   Intractable low back pain   Pancreatitis   Compression fracture   Hypokalemia   Lung nodule   Aortic aneurysm (HCC)   Lumbar compression fracture (HCC)   Protein-calorie malnutrition, severe    Follow-up recommendations Follow-up with PCP in 3-5 days , including all  additional recommended appointments as below Follow-up CBC, CMP in 3-5 days Patient is status post kyphoplasty on 12/20 Patient would need follow-up CT scan, infrarenal abdominal aortic aneurysm in one year Patient would need a follow-up CT scan of the lung in 3 months Both CT scans are to be arranged for by PCP    Medication List    STOP taking these medications        metoprolol succinate 100 MG 24 hr tablet  Commonly known as:  TOPROL-XL     metoprolol succinate 50 MG 24 hr tablet  Commonly known as:  TOPROL-XL     simvastatin 5 MG tablet  Commonly known as:  ZOCOR      TAKE these medications        aspirin 81 MG tablet  Take 81 mg by mouth daily.     Co Q-10 50 MG Caps  Take 50 mg by mouth daily.     docusate sodium 100 MG capsule  Commonly known as:  COLACE  Take 1 capsule (100 mg total) by mouth 2 (two) times daily.     ENSURE  Take 1 Can by mouth 3 (three) times daily between meals.     feeding supplement (ENSURE ENLIVE) Liqd  Take 237 mLs by mouth 2 (two) times daily between meals.     HYDROcodone-acetaminophen 5-325 MG tablet  Commonly known as:  NORCO/VICODIN  Take 1 tablet by mouth every 4 (four) hours as needed for moderate pain.     lisinopril 5 MG tablet  Commonly known as:  PRINIVIL,ZESTRIL   TAKE 1 TABLET ONCE DAILY.     methocarbamol 500 MG tablet  Commonly known as:  ROBAXIN  Take 1 tablet (500 mg total) by mouth every 6 (six) hours as needed for muscle spasms.     metoprolol tartrate 25 MG tablet  Commonly known as:  LOPRESSOR  Take 0.5 tablets (12.5 mg total) by mouth 2 (two) times daily.     multivitamin-lutein Caps capsule  Take 1 capsule by mouth daily. 6MG     OVER THE COUNTER MEDICATION  Take 1 tablet by mouth daily. Omega Red     rivastigmine 9.5 mg/24hr  Commonly known as:  EXELON  Place 9.5 mg onto the skin daily.     Vitamin D3 2000 UNITS Tabs  Take 2,000 Units by mouth daily.     vitamin E 400 UNIT capsule  Take 400 Units by mouth daily.         Discharge Condition: Stable   Discharge Instructions       Discharge Instructions    Diet - low sodium heart healthy    Complete by:  As directed      Increase activity slowly    Complete by:  As directed              Allergies  Allergen Reactions  . Hydrocodone-Acetaminophen Anxiety    Pt nervous and shaky per pt husband  . Penicillins Palpitations  . Cyclobenzaprine Other (See Comments)    Dry mouth  . Sulfa Antibiotics Rash      Disposition: 01-Home or Self Care   Consults:  Interventional radiology     Significant Diagnostic Studies:  Dg Lumbar Spine 2-3 Views  12/17/2014  CLINICAL DATA:  Low back pain. EXAM: LUMBAR SPINE - 2-3 VIEW COMPARISON:  11/15/2011. FINDINGS: Thoracolumbar spine degenerative change and scoliosis. No acute abnormality identified. Diffuse osteopenia. 3.3 cm abdominal aortic aneurysm noted. Aortic ultrasound can be obtained for further evaluation. Sliding hiatal hernia. Prior cardiac valve replacement. IMPRESSION: 1. Diffuse osteopenia degenerative change with scoliosis concave right. 2. 3.3 cm abdominal aortic aneurysm. Aortic ultrasound can be obtained for further evaluation. 3. Sliding hiatal hernia. Electronically Signed   By: Thomas  Register    On: 12/17/2014 14:56   Dg Pelvis 1-2 Views  12/17/2014  CLINICAL DATA:  Low back pain with bilateral sciatica for 1 week. No reported injury. EXAM: PELVIS - 1-2 VIEW COMPARISON:  April 17, 2014. FINDINGS: Status post right hemiarthroplasty. Prosthesis appears well situated. No fracture or dislocation is noted. Left hip appears normal. IMPRESSION: No acute abnormality seen. Electronically Signed   By: James  Green Jr, M.D.   On: 12/17/2014 14:56   Mr Lumbar Spine W Wo Contrast  12/20/2014  CLINICAL DATA:  Patient fell in parking lot. Pain radiates to RIGHT hip. EXAM: MRI LUMBAR SPINE WITHOUT AND WITH CONTRAST TECHNIQUE: Multiplanar and multiecho pulse sequences of the lumbar spine were obtained without and with intravenous contrast. CONTRAST:  10mL MULTIHANCE GADOBENATE DIMEGLUMINE 529 MG/ML IV SOLN COMPARISON:  Lumbar spine radiograph 12/17/2014. CTA chest and abdomen 12/19/2014. FINDINGS: Segmentation: Normal. Alignment:  Normal except for trace retrolisthesis L1-2. Vertebrae: There is an acute compression fracture of L1, primarily inferior endplate deformity, but also rightward anterior inferior chip fractures, with edema confined to the lower 1/2 of the vertebral body, only minor wedging, and prevertebral soft tissue swelling. As demonstrated to greater detail on CT axial coronal images, the lateral inferior aspect of the vertebral body which is associated with osseous ridging, has fractured on its RIGHT side, and is displaced laterally. Tiny corner fracture anteriorly superiorly at L2, minor compared to the edema throughout L1. No significant retropulsion or epidural hematoma.No other worrisome osseous lesions. Conus medullaris: Normal in size, and signal. Trace low termination. Paraspinal tissues: No evidence for hydronephrosis. Renal cystic disease, incompletely evaluated. Abdominal aortic ectasia up to 28 mm diameter. Ectatic abdominal aorta at risk for aneurysm development. Recommend followup by  ultrasound in 5 years. This recommendation follows ACR consensus guidelines: White Paper of the ACR Incidental Findings Committee II on Vascular Findings. J Am Coll Radiol 2013; 10:789-794. Disc levels: L1-L2: There is mild annular bulging/subligamentous protrusion, and disc osteophyte complex, eccentric to the RIGHT. No definite subarticular zone narrowing or epidural hematoma but asymmetric RIGHT-sided foraminal narrowing likely irritates the RIGHT L1 nerve root. In addition the RIGHT L1 nerve root is likely displace in its extraforaminal course due to fractures of the RIGHT lateral inferior aspect of that vertebral body. See coronal CTA image 98 series 702, and axial CTA images 141-145 series 701. Pedicles are intact. L2-L3:  Tiny annular rent.  No impingement. L3-L4:  Unremarkable. L4-L5:  Unremarkable. L5-S1:  Unremarkable. Compared with plain films, the fracture is difficult to see due to osteopenia IMPRESSION: Acute compression fracture of L1, consistent with   a posttraumatic osteopenic (nonpathologic) injury, with bone marrow edema primarily confined to the inferior 1/2 of the vertebral body. Inferior endplate deformity, with anterior chip fracture. Anterior prevertebral hematoma. The patient's RIGHT hip pain may relate to asymmetric foraminal narrowing at L1-2 on the RIGHT due to disc osteophyte complex as well as displacement of the extraforaminal nerve alongside the injured lateral inferior aspect of L1. See discussion above. Tiny anterior superior corner injury to L2, without wedging. The patient may be a candidate for vertebral augmentation if conservative measures do not result in improvement. Electronically Signed   By: John T Curnes M.D.   On: 12/20/2014 13:38   Ct Angio Chest Aorta W/cm &/or Wo/cm  12/19/2014  CLINICAL DATA:  87-year-old female with abdominal aortic aneurysm and flank pain EXAM: CT ANGIOGRAPHY CHEST, ABDOMEN AND PELVIS TECHNIQUE: Multidetector CT imaging through the chest,  abdomen and pelvis was performed using the standard protocol during bolus administration of intravenous contrast. Multiplanar reconstructed images and MIPs were obtained and reviewed to evaluate the vascular anatomy. CONTRAST:  100 mL Omni 350 COMPARISON:  None. FINDINGS: CTA CHEST FINDINGS Mediastinum: Unremarkable CT appearance of the thyroid gland for age. No suspicious mediastinal or hilar adenopathy. No soft tissue mediastinal mass. Large sliding hiatal hernia with nearly completely intra thoracic stomach. Heart/Vascular: Cardiomegaly. Patient is status post median sternotomy with evidence of multivessel CABG including LIMA to LAD bypass. No pericardial effusion. Surgical changes of prior mitral valve annuloplasty. No aortic dissection or aneurysm. Heterogeneous atherosclerotic plaque. Tortuous and elongated transverse and descending thoracic aorta. Four vessel arch. The left vertebral artery arises directly from the aorta. Lungs/Pleura: 6 mm ground-glass attenuation nodular opacity in the right lung apex (image 10 series 706). A 4 mm nodule in the periphery of the right upper lobe (image 21 series 706). There is a 3 mm nodule in the periphery of the left upper lobe on image 26. 5 mm nodule in the periphery of the left lower lobe on image 42. There are a few additional technique scattered 2 mm subpleural nodules. Whole dependent atelectasis in the medial aspect of both lower lobes likely secondary to the large hiatal hernia. Trace scarring versus is bandlike atelectasis in the inferior lingula. Lungs are otherwise clear. No focal consolidation. Bones/Soft Tissues: No acute fracture or aggressive appearing lytic or blastic osseous lesion. Review of the MIP images confirms the above findings. CTA ABDOMEN AND PELVIS FINDINGS VASCULAR Aorta: Mild ectasia of the infrarenal abdominal aorta with a maximal diameter of 2.6 cm. Her scattered atherosclerotic vascular calcifications but no significant stenosis or evidence  of dissection. Celiac: Widely patent. Conventional hepatic arterial anatomy. No visceral artery aneurysm. SMA: Atherosclerotic plaque in the mid portion without significant stenosis. No aneurysm. Renals: Single dominant renal arteries without evidence of significant stenosis, aneurysm or fibromuscular dysplasia. IMA: Patent and unremarkable. Inflow: Minimal atherosclerotic plaque without evidence of aneurysm or significant stenosis. Proximal Outflow: Middle atherosclerotic plaque without evidence of aneurysm or significant stenosis. Veins: No focal venous abnormality. NON-VASCULAR Abdomen: Intrathoracic stomach due to very large hiatal hernia. Unremarkable CT appearance of the spleen, adrenal glands and pancreas save for mild prominence of the main pancreatic duct. There is no associated atrophy or mass. Normal hepatic contour and morphology. There are at least 4 subcentimeter circumscribed low-attenuation lesions scattered throughout the liver which are too small to characterize but statistically highly likely benign cysts. The main portal veins are patent. Gallbladder is unremarkable. No intra or extrahepatic biliary ductal dilatation. Unremarkable appearance of the bilateral kidneys.   No focal solid lesion, hydronephrosis or nephrolithiasis. Small probable simple cyst upper pole left kidney. Extensive colonic diverticulosis. No evidence of active inflammation. No evidence of bowel obstruction or focal bowel wall thickening. Moderate volume of formed stool in the rectum. Pelvis: Surgical changes of prior hysterectomy. The bladder is distended with urine. Streak artifact from right total hip arthroplasty slightly limits evaluation. Bones/Soft Tissues: Stable chronic T7 compression fracture with approximately 60% height loss. Age indeterminate compression fracture of the inferior endplate of L1 with was 20% height loss. Multilevel degenerative disc disease. Generalized demineralization. Review of the MIP images  confirms the above findings. IMPRESSION: CTA CHEST 1. No evidence of aortic aneurysm or dissection. 2. Massive hiatal hernia with nearly completely intra thoracic stomach. 3. Atherosclerosis and multivessel coronary artery calcifications. 4. Surgical changes of prior multivessel CABG happened mitral valve annuloplasty. 5. Cardiomegaly. 6. Multiple bilateral pulmonary nodules measuring up to 5 mm. In the absence of a known primary malignancy these likely represent sequelae of prior infection/inflammation or a granulomatous process. Consider follow-up CT scan of the chest in 1 year to confirm stability. 7. Chronic T7 compression fracture. CTA ABD/PELVIS 1. Fusiform ectasia of the infrarenal abdominal aorta with maximum transverse to the 2.6 cm. Ectatic abdominal aorta at risk for aneurysm development. Recommend followup by ultrasound in 5 years. This recommendation follows ACR consensus guidelines: White Paper of the ACR Incidental Findings Committee II on Vascular Findings. J Am Coll Radiol 2013; 10:789-794. 2. Extensive colonic diverticulosis without evidence of active inflammation. 3. Moderate volume retained stool in the rectum suggests an element of constipation. 4. Age indeterminate compression fracture of the inferior endplate of L1. Signed, Criselda Peaches, MD [facility] Electronically Signed   By: Jacqulynn Cadet M.D.   On: 12/19/2014 11:34   Ct Angio Abd/pel W/ And/or W/o  12/19/2014  CLINICAL DATA:  81 year old female with abdominal aortic aneurysm and flank pain EXAM: CT ANGIOGRAPHY CHEST, ABDOMEN AND PELVIS TECHNIQUE: Multidetector CT imaging through the chest, abdomen and pelvis was performed using the standard protocol during bolus administration of intravenous contrast. Multiplanar reconstructed images and MIPs were obtained and reviewed to evaluate the vascular anatomy. CONTRAST:  100 mL Omni 350 COMPARISON:  None. FINDINGS: CTA  CHEST FINDINGS Mediastinum: Unremarkable CT appearance of the thyroid gland for age. No suspicious mediastinal or hilar adenopathy. No soft tissue mediastinal mass. Large sliding hiatal hernia with nearly completely intra thoracic stomach. Heart/Vascular: Cardiomegaly. Patient is status post median sternotomy with evidence of multivessel CABG including LIMA to LAD bypass. No pericardial effusion. Surgical changes of prior mitral valve annuloplasty. No aortic dissection or aneurysm. Heterogeneous atherosclerotic plaque. Tortuous and elongated transverse and descending thoracic aorta. Four vessel arch. The left vertebral artery arises directly from the aorta. Lungs/Pleura: 6 mm ground-glass attenuation nodular opacity in the right lung apex (image 10 series 706). A 4 mm nodule in the periphery of the right upper lobe (image 21 series 706). There is a 3 mm nodule in the periphery of the left upper lobe on image 26. 5 mm nodule in the periphery of the left lower lobe on image 42. There are a few additional technique scattered 2 mm subpleural nodules. Whole dependent atelectasis in the medial aspect of both lower lobes likely secondary to the large hiatal hernia. Trace scarring versus is bandlike atelectasis in the inferior lingula. Lungs are otherwise clear. No focal consolidation. Bones/Soft Tissues: No acute fracture or aggressive appearing lytic or blastic osseous lesion. Review of  the MIP images confirms the above findings. CTA ABDOMEN AND PELVIS FINDINGS VASCULAR Aorta: Mild ectasia of the infrarenal abdominal aorta with a maximal diameter of 2.6 cm. Her scattered atherosclerotic vascular calcifications but no significant stenosis or evidence of dissection. Celiac: Widely patent. Conventional hepatic arterial anatomy. No visceral artery aneurysm. SMA: Atherosclerotic plaque in the mid portion without significant stenosis. No aneurysm. Renals: Single dominant renal arteries without evidence of significant stenosis,  aneurysm or fibromuscular dysplasia. IMA: Patent and unremarkable. Inflow: Minimal atherosclerotic plaque without evidence of aneurysm or significant stenosis. Proximal Outflow: Middle atherosclerotic plaque without evidence of aneurysm or significant stenosis. Veins: No focal venous abnormality. NON-VASCULAR Abdomen: Intrathoracic stomach due to very large hiatal hernia. Unremarkable CT appearance of the spleen, adrenal glands and pancreas save for mild prominence of the main pancreatic duct. There is no associated atrophy or mass. Normal hepatic contour and morphology. There are at least 4 subcentimeter circumscribed low-attenuation lesions scattered throughout the liver which are too small to characterize but statistically highly likely benign cysts. The main portal veins are patent. Gallbladder is unremarkable. No intra or extrahepatic biliary ductal dilatation. Unremarkable appearance of the bilateral kidneys. No focal solid lesion, hydronephrosis or nephrolithiasis. Small probable simple cyst upper pole left kidney. Extensive colonic diverticulosis. No evidence of active inflammation. No evidence of bowel obstruction or focal bowel wall thickening. Moderate volume of formed stool in the rectum. Pelvis: Surgical changes of prior hysterectomy. The bladder is distended with urine. Streak artifact from right total hip arthroplasty slightly limits evaluation. Bones/Soft Tissues: Stable chronic T7 compression fracture with approximately 60% height loss. Age indeterminate compression fracture of the inferior endplate of L1 with was 20% height loss. Multilevel degenerative disc disease. Generalized demineralization. Review of the MIP images confirms the above findings. IMPRESSION: CTA CHEST 1. No evidence of aortic aneurysm or dissection. 2. Massive hiatal hernia with nearly completely intra thoracic stomach. 3. Atherosclerosis and multivessel coronary artery calcifications. 4. Surgical changes of prior multivessel  CABG happened mitral valve annuloplasty. 5. Cardiomegaly. 6. Multiple bilateral pulmonary nodules measuring up to 5 mm. In the absence of a known primary malignancy these likely represent sequelae of prior infection/inflammation or a granulomatous process. Consider follow-up CT scan of the chest in 1 year to confirm stability. 7. Chronic T7 compression fracture. CTA ABD/PELVIS 1. Fusiform ectasia of the infrarenal abdominal aorta with maximum transverse to the 2.6 cm. Ectatic abdominal aorta at risk for aneurysm development. Recommend followup by ultrasound in 5 years. This recommendation follows ACR consensus guidelines: White Paper of the ACR Incidental Findings Committee II on Vascular Findings. J Am Coll Radiol 2013; 10:789-794. 2. Extensive colonic diverticulosis without evidence of active inflammation. 3. Moderate volume retained stool in the rectum suggests an element of constipation. 4. Age indeterminate compression fracture of the inferior endplate of L1. Signed, Criselda Peaches, MD [facility]iology Electronically Signed   By: Jacqulynn Cadet M.D.   On: 12/19/2014 11:34        Filed Weights   12/19/14 1553 12/21/14 0518 12/22/14 0342  Weight: 43.409 kg (95 lb 11.2 oz) 43.7 kg (96 lb 5.5 oz) 43.364 kg (95 lb 9.6 oz)     Microbiology: No results found for this or any previous visit (from the past 240 hour(s)).     Blood Culture    Component Value Date/Time   SDES URINE, CATHETERIZED 04/17/2014 0852   SPECREQUEST Normal 04/17/2014 0852   CULT NO GROWTH Performed at [facility]  04/17/2014 0852   REPTSTATUS 04/18/2014 FINAL 04/17/2014 0852      Labs: Results for orders placed or performed during the hospital encounter of 12/19/14 (from the past 48 hour(s))  CBC     Status: Abnormal   Collection Time: 12/23/14  5:27 AM  Result Value Ref Range   WBC 13.1 (H) 4.0 - 10.5 K/uL   RBC 4.70 3.87 - 5.11 MIL/uL    Hemoglobin 13.6 12.0 - 15.0 g/dL   HCT 42.6 36.0 - 46.0 %   MCV 90.6 78.0 - 100.0 fL   MCH 28.9 26.0 - 34.0 pg   MCHC 31.9 30.0 - 36.0 g/dL   RDW 14.2 11.5 - 15.5 %   Platelets 228 150 - 400 K/uL  Comprehensive metabolic panel     Status: Abnormal   Collection Time: 12/23/14  5:27 AM  Result Value Ref Range   Sodium 140 135 - 145 mmol/L   Potassium 3.8 3.5 - 5.1 mmol/L   Chloride 101 101 - 111 mmol/L   CO2 28 22 - 32 mmol/L   Glucose, Bld 103 (H) 65 - 99 mg/dL   BUN 15 6 - 20 mg/dL   Creatinine, Ser 0.68 0.44 - 1.00 mg/dL   Calcium 9.5 8.9 - 10.3 mg/dL   Total Protein 6.7 6.5 - 8.1 g/dL   Albumin 3.4 (L) 3.5 - 5.0 g/dL   AST 30 15 - 41 U/L   ALT 14 14 - 54 U/L   Alkaline Phosphatase 97 38 - 126 U/L   Total Bilirubin 0.5 0.3 - 1.2 mg/dL   GFR calc non Af Amer >60 >60 mL/min   GFR calc Af Amer >60 >60 mL/min    Comment: (NOTE) The eGFR has been calculated using the CKD EPI equation. This calculation has not been validated in all clinical situations. eGFR's persistently <60 mL/min signify possible Chronic Kidney Disease.    Anion gap 11 5 - 15  Urinalysis, Routine w reflex microscopic (not at [facility])     Status: Abnormal   Collection Time: 12/23/14  9:26 PM  Result Value Ref Range   Color, Urine YELLOW YELLOW   APPearance CLOUDY (A) CLEAR   Specific Gravity, Urine 1.018 1.005 - 1.030   pH 7.0 5.0 - 8.0   Glucose, UA NEGATIVE NEGATIVE mg/dL   Hgb urine dipstick NEGATIVE NEGATIVE   Bilirubin Urine NEGATIVE NEGATIVE   Ketones, ur 15 (A) NEGATIVE mg/dL   Protein, ur NEGATIVE NEGATIVE mg/dL   Nitrite NEGATIVE NEGATIVE   Leukocytes, UA NEGATIVE NEGATIVE    Comment: MICROSCOPIC NOT DONE ON URINES WITH NEGATIVE PROTEIN, BLOOD, LEUKOCYTES, NITRITE, OR GLUCOSE <1000 mg/dL.  CBC with Differential/Platelet     Status: Abnormal   Collection Time: 12/24/14  2:50 AM  Result Value Ref Range   WBC 12.7 (H) 4.0 - 10.5 K/uL   RBC 4.33 3.87 - 5.11 MIL/uL   Hemoglobin 12.8 12.0 - 15.0 g/dL    HCT 38.9 36.0 - 46.0 %   MCV 89.8 78.0 - 100.0 fL   MCH 29.6 26.0 - 34.0 pg   MCHC 32.9 30.0 - 36.0 g/dL   RDW 14.0 11.5 - 15.5 %   Platelets 208 150 - 400 K/uL   Neutrophils Relative % 28 %   Lymphocytes Relative 67 %   Monocytes Relative 5 %   Eosinophils Relative 0 %   Basophils Relative 0 %   Neutro Abs 3.6 1.7 - 7.7 K/uL   Lymphs Abs 8.5 (H) 0.7 - 4.0 K/uL  Monocytes Absolute 0.6 0.1 - 1.0 K/uL   Eosinophils Absolute 0.0 0.0 - 0.7 K/uL   Basophils Absolute 0.0 0.0 - 0.1 K/uL   WBC Morphology ATYPICAL LYMPHOCYTES     Comment: Performed at [facility]     Lipid Panel     Component Value Date/Time   CHOL 172 11/16/2013 0953   TRIG 83.0 11/16/2013 0953   HDL 53.00 11/16/2013 0953   CHOLHDL 3 11/16/2013 0953   VLDL 16.6 11/16/2013 0953   LDLCALC 102* 11/16/2013 0953     Lab Results  Component Value Date   HGBA1C 5.9* 10/08/2011     Lab Results  Component Value Date   LDLCALC 102* 11/16/2013   CREATININE 0.68 12/23/2014    History of present illness 81 y.o. female with PMH of dCHF, CAD, ischemic heart disease status post CABG 2013, history of mitral valve repair and angioplasty since emergency department with one-week history of intractable back pain.  -admitted with L1 compression fracture, symptomatic bradycardia, pancreatitis.   Assessment and plan 1. Bradycardia. She was having pauses up to 4 seconds and the next beat would be junctional, not sinus bradycardia would precede the nausea. Likely Vagal. resolved off of Toprol (43m QD). Cardiac enzymes negative Normal TSH No indication for pacer at this time as per cardiology - OK to proceed with Kyphoplasty  Patient often when he developed sinus tachycardia with heart rate in the 120s, restarted patient on metoprolol 6.25 mg PO BID , based on patient request  #2 probable pancreatitis. Lipase 296, now 41, no clinical signs of acute pancreatitis, nausea resolved CT with mild prominence of  main pancreatic duct -Denies abdominal pain, no nausea or vomiting, no EtOH use, no transaminitis Continue soft diet   #3. Intractable back pain. Likely related to compression fracture at L1 TLSO brace, MRI of the lumbar spine,IR consultation for vertebroplasty, delayed insurance authorization Continue Robaxin and oral pain medication , patient received IV Solu-Medrol 5 days with significantly improved her pain Suspect patient will not need steroids after kyphoplasty Patient would need to ambulate with physical therapy after kyphoplasty, Anticipate discharge home today  #4. Hypertension -Controlled in the emergency department -Hold home beta blocker -Continue ACE inhibitor, increased dose of lisinopril to 10 mg  #5. Hypokalemia.Resolved  #6. CAD. No chest pain. Initial troponin at baseline. History of ischemic heart disease status post CABG 2013 as well as mitral valve repair angioplasty 2013 Stable, cardiology following -Last echo last year with the EF of 60%  #7. chronic Diastolic heart failureWithout exacerbation -Appears euvolemic -We'll monitor intake and output obtain daily weights -EF last year 60%  #8. Aortic aneurysm. Incidental finding on imaging done outpatient. Today CT negative for aortic aneurysm dissection -Patient instructed to schedule ultrasound but has not done this yet -Outpatient follow-up   #9. Pulmonary nodules per CT -Incidental findings Will need outpatient follow-up     Discharge Exam:    Blood pressure 136/71, pulse 69, temperature 98 F (36.7 C), temperature source Oral, resp. rate 18, height 5' 5" (1.651 m), weight 43.364 kg (95 lb 9.6 oz), SpO2 97 %.  General: Very uncomfortable because of low back pain Lungs: Clear to auscultation bilaterally without wheezes or crackles Cardiovascular: Regular rate and rhythm without murmur gallop or rub normal S1 and S2 Abdomen: Nontender, nondistended, soft, bowel sounds positive, no rebound, no  ascites, no appreciable mass Extremities: No significant cyanosis, clubbing, or edema bilateral lower extremities, increased paraspinal muscle spasm     Follow-up Information  Follow up with Vidal Schwalbe, MD. Schedule an appointment as soon as possible for a visit in 3 days.   Specialty:  Family Medicine   Contact information:   [place] [address] [phone]       Signed: Reyne Dumas 12/24/2014, 9:58 AM        Time spent >45 mins

## 2014-12-23 NOTE — Telephone Encounter (Signed)
Spoke to care management today about the patient's insurance authorization. As of 5 pm 12/23/14 the insurance is still reviewing the patient's clinical information and has not made a decision as of yet. Will call the insurance company back first thing in the am and call care management back with an update on this. JM

## 2014-12-23 NOTE — Progress Notes (Signed)
PT Cancellation Note  Patient Details Name: CLEASTER CAPRA MRN: [id number] DOB: [birth year]   Cancelled Treatment:    Reason Eval/Treat Not Completed: Medical issues which prohibited therapy. Per chart, pt is to have vertebroplasty today. Spoke with nursing who stated pt is scheduled for procedure at some point today.  Will hold off on PT at this time and will check back.   Saamiya Jeppsen LUBECK 12/23/2014, 8:47 AM

## 2014-12-24 ENCOUNTER — Inpatient Hospital Stay (HOSPITAL_COMMUNITY): Payer: Medicare Other

## 2014-12-24 LAB — CBC WITH DIFFERENTIAL/PLATELET
BASOS PCT: 0 %
Basophils Absolute: 0 10*3/uL (ref 0.0–0.1)
EOS PCT: 0 %
Eosinophils Absolute: 0 10*3/uL (ref 0.0–0.7)
HEMATOCRIT: 38.9 % (ref 36.0–46.0)
Hemoglobin: 12.8 g/dL (ref 12.0–15.0)
LYMPHS ABS: 8.5 10*3/uL — AB (ref 0.7–4.0)
Lymphocytes Relative: 67 %
MCH: 29.6 pg (ref 26.0–34.0)
MCHC: 32.9 g/dL (ref 30.0–36.0)
MCV: 89.8 fL (ref 78.0–100.0)
MONO ABS: 0.6 10*3/uL (ref 0.1–1.0)
Monocytes Relative: 5 %
NEUTROS PCT: 28 %
Neutro Abs: 3.6 10*3/uL (ref 1.7–7.7)
PLATELETS: 208 10*3/uL (ref 150–400)
RBC: 4.33 MIL/uL (ref 3.87–5.11)
RDW: 14 % (ref 11.5–15.5)
WBC: 12.7 10*3/uL — ABNORMAL HIGH (ref 4.0–10.5)

## 2014-12-24 MED ORDER — BUPIVACAINE HCL (PF) 0.25 % IJ SOLN
INTRAMUSCULAR | Status: AC
  Filled 2014-12-24: qty 30

## 2014-12-24 MED ORDER — METOPROLOL TARTRATE 25 MG PO TABS: 6.2500 mg | ORAL_TABLET | Freq: Two times a day (BID) | ORAL | Status: DC

## 2014-12-24 MED ORDER — TOBRAMYCIN SULFATE 1.2 G IJ SOLR
INTRAMUSCULAR | Status: AC
  Filled 2014-12-24: qty 1.2

## 2014-12-24 MED ORDER — FENTANYL CITRATE (PF) 100 MCG/2ML IJ SOLN
INTRAMUSCULAR | Status: AC
  Filled 2014-12-24: qty 2

## 2014-12-24 MED ORDER — IOHEXOL 300 MG/ML  SOLN
50.0000 mL | Freq: Once | INTRAMUSCULAR | Status: AC | PRN
  Administered 2014-12-24: 5 mL via INTRAVENOUS

## 2014-12-24 MED ORDER — FENTANYL CITRATE (PF) 100 MCG/2ML IJ SOLN
INTRAMUSCULAR | Status: AC | PRN
  Administered 2014-12-24: 25 ug via INTRAVENOUS
  Administered 2014-12-24: 12.5 ug via INTRAVENOUS

## 2014-12-24 MED ORDER — MIDAZOLAM HCL 2 MG/2ML IJ SOLN
INTRAMUSCULAR | Status: AC | PRN
  Administered 2014-12-24: 1 mg via INTRAVENOUS
  Administered 2014-12-24: 0.5 mg via INTRAVENOUS

## 2014-12-24 MED ORDER — HYDROMORPHONE HCL 1 MG/ML IJ SOLN
INTRAMUSCULAR | Status: AC
  Filled 2014-12-24: qty 1

## 2014-12-24 MED ORDER — METOPROLOL TARTRATE 25 MG/10 ML ORAL SUSPENSION
6.2500 mg | Freq: Two times a day (BID) | ORAL | Status: DC
  Administered 2014-12-24 – 2014-12-25 (×3): 6.25 mg via ORAL
  Filled 2014-12-24 (×3): qty 10

## 2014-12-24 MED ORDER — SODIUM CHLORIDE 0.9 % IV SOLN: INTRAVENOUS | Status: AC

## 2014-12-24 MED ORDER — MIDAZOLAM HCL 2 MG/2ML IJ SOLN
INTRAMUSCULAR | Status: AC
  Filled 2014-12-24: qty 2

## 2014-12-24 MED ORDER — VANCOMYCIN HCL IN DEXTROSE 1-5 GM/200ML-% IV SOLN
1000.0000 mg | INTRAVENOUS | Status: AC
  Administered 2014-12-24: 1000 mg via INTRAVENOUS
  Filled 2014-12-24: qty 200

## 2014-12-24 NOTE — Procedures (Signed)
S/P L1 balloon KP 

## 2014-12-24 NOTE — Progress Notes (Signed)
Pt now states that she does not feel comfortable going home. Discussed with Dr. Allyson Sabal. Per Dr. Allyson Sabal, pt may stay overnight. D/C paperwork including Hydrocodone and Robaxine scripts in pts paper chart.

## 2014-12-24 NOTE — Care Management Important Message (Signed)
Important Message  Patient Details  Name: Victoria Sexton MRN: [id number] Date of Birth: 1927-09-20   Medicare Important Message Given:  Yes    Maryclare Labrador, RN 12/24/2014, 1:34 PM

## 2014-12-24 NOTE — Progress Notes (Addendum)
PT Cancellation Note  Patient Details Name: Victoria Sexton MRN: [id number] DOB: 1927/01/31   Cancelled Treatment:    Reason Eval/Treat Not Completed: Patient declined, no reason specified  Pt very emotional upon PT arrival. Spouse visibly angry/upset about situation, "you people just want money." Tried to console and listen to pt as spouse stepped out of room. Explained purpose of PT with regards to d/c plan however pt wants to wait until she hears from insurance about procedure. Will follow up.  Pt off floor at procedure. Will follow up on 12/21 as time allows.  [name] 12/24/2014, 10:24 AM Wray Kearns, PT, DPT [phone]

## 2014-12-24 NOTE — Care Management Note (Addendum)
Case Management Note  Patient Details  Name: Victoria Sexton MRN: [id number] Date of Birth: May 29, 1927  Subjective/Objective:                    Action/Plan:  Pt is from home with husband.  Pt is awaiting approval from insurance for scheduled procedure,    Expected Discharge Date:                  Expected Discharge Plan:  [place]  In-House Referral:  Clinical Social Work  Discharge planning Services  CM Consult  Post Acute Care Choice:    Choice offered to:     DME Arranged:    DME Agency:     HH Arranged:    [place] Agency:     Status of Service:  In process, will continue to follow  Medicare Important Message Given:  Yes Date Medicare IM Given:    Medicare IM give by:    Date Additional Medicare IM Given:    Additional Medicare Important Message give by:     If discussed at [place] of Stay Meetings, dates discussed:    Additional Comments: 12/24/2014  CM spoke with pt and husband in depth regarding recommended HH, both adamately refused.  CM explained the concern with pt returning home without PT evaluation and without [facility] visits, pt stated  "I had it before and I do not believe I need it now", husband stated "its not needed".  CM informed both pt and husband that if [facility] is desired post discharge to simply ask PCP to arrange.  CM questioned pt and husband about possible DME needs, pt husband stated " we only need a wheelchair to get her to the front door, we have everything else we need at home".  PT re-evaluation was refused by pt/husband.  CSW will meet with pt/husband regarding safe discharge plan.  12/23/14  Please see CSW note regarding consult with SNF placement  CM assessed pt with husband at bedside.  While pt remained calm and cooperative; Pts husband communicated that he was very frustrated in the delay Kyphoplasty procedure, awaiting insurance approval.  CM, bedside nurse, unit charge nurse and director contacted IR to inquire of approval  process/delay in procedure.  CM contacted Physician Advisor to discuss delay and discharge planning.  CM also spoke with attending and IR PA regarding delay and possibility of discharge while awaiting for insurance approval due to lack of intensity.  During the conversation with pt regarding possible discharge awaiting insurance approval;  Pts husband stated he would not be able to take pt home in current condition awaiting insurance approval due to lack of mobility and pain. Pt is now on PO PRN pain medication.   CM consulted with CSW regarding possible SNF consult post PT reevaluation in the am of 12/24/14; however pts husband adamantly refused SNF placement.    Plan agreed to by MD; IR PA, Medical Advisor,pt and husband witnessed by director at pts bedside:  Pt will be evaluated early am 12/24/14 by in house therapy to determine safe discharge plan to be executed on 12/24/14 if insurance approval is not been provided by mid day on 12/24/14.  Again, pt remained calm and  asked husband to calm down, pt stated "he is just hard headed".  Maryclare Labrador, RN 12/24/2014, 11:29 AM

## 2014-12-24 NOTE — Progress Notes (Addendum)
Pt ready to go home. VSS per flowsheet. Incision unremarkable. Will cont to monitor for full 3 hr post-intervention orders, then d/c per order. See case mgr notes regarding refusal of home health.

## 2014-12-25 MED ORDER — SORBITOL 70 % SOLN: 20.0000 mL | Freq: Every day | Status: DC | PRN

## 2014-12-25 MED ORDER — SORBITOL 70 % SOLN
20.0000 mL | Freq: Once | Status: AC
  Administered 2014-12-25: 20 mL via ORAL
  Filled 2014-12-25: qty 30

## 2014-12-25 NOTE — Discharge Summary (Signed)
Physician Discharge Summary  Victoria Sexton MRN: [id number] DOB/AGE: [birth year] 81 y.o.  PCP: Vidal Schwalbe, MD   Admit date: 12/19/2014 Discharge date: 12/25/2014  Discharge Diagnoses:     Principal Problem:   Bradycardia Active Problems:   Mitral regurgitation   Essential hypertension, benign   Coronary artery disease   Congestive heart failure (HCC)   HLD (hyperlipidemia)   Intractable low back pain   Pancreatitis   Compression fracture   Hypokalemia   Lung nodule   Aortic aneurysm (HCC)   Lumbar compression fracture (HCC)   Protein-calorie malnutrition, severe    Follow-up recommendations Follow-up with PCP in 3-5 days , including all  additional recommended appointments as below Follow-up CBC, CMP in 3-5 days Patient is status post kyphoplasty on 12/20 Patient would need follow-up CT scan, infrarenal abdominal aortic aneurysm in one year Patient would need a follow-up CT scan of the lung in 3 months Both CT scans are to be arranged for by PCP [place] denied by the patient    Medication List    STOP taking these medications        metoprolol succinate 100 MG 24 hr tablet  Commonly known as:  TOPROL-XL     metoprolol succinate 50 MG 24 hr tablet  Commonly known as:  TOPROL-XL     simvastatin 5 MG tablet  Commonly known as:  ZOCOR      TAKE these medications        aspirin 81 MG tablet  Take 81 mg by mouth daily.     Co Q-10 50 MG Caps  Take 50 mg by mouth daily.     docusate sodium 100 MG capsule  Commonly known as:  COLACE  Take 1 capsule (100 mg total) by mouth 2 (two) times daily.     ENSURE  Take 1 Can by mouth 3 (three) times daily between meals.     feeding supplement (ENSURE ENLIVE) Liqd  Take 237 mLs by mouth 2 (two) times daily between meals.     HYDROcodone-acetaminophen 5-325 MG tablet  Commonly known as:  NORCO/VICODIN  Take 1 tablet by mouth every 4 (four) hours as needed for moderate pain.     lisinopril 5 MG tablet   Commonly known as:  PRINIVIL,ZESTRIL  TAKE 1 TABLET ONCE DAILY.     methocarbamol 500 MG tablet  Commonly known as:  ROBAXIN  Take 1 tablet (500 mg total) by mouth every 6 (six) hours as needed for muscle spasms.     metoprolol tartrate 25 MG tablet  Commonly known as:  LOPRESSOR  Take 0.5 tablets (12.5 mg total) by mouth 2 (two) times daily.     multivitamin-lutein Caps capsule  Take 1 capsule by mouth daily. 6MG      OVER THE COUNTER MEDICATION  Take 1 tablet by mouth daily. Omega Red     rivastigmine 9.5 mg/24hr  Commonly known as:  EXELON  Place 9.5 mg onto the skin daily.     sorbitol 70 % Soln  Take 20 mLs by mouth daily as needed for moderate constipation.     Vitamin D3 2000 UNITS Tabs  Take 2,000 Units by mouth daily.     vitamin E 400 UNIT capsule  Take 400 Units by mouth daily.         Discharge Condition: Stable   Discharge Instructions   Discharge Instructions    Diet - low sodium heart healthy    Complete by:  As directed  Diet - low sodium heart healthy    Complete by:  As directed      Diet - low sodium heart healthy    Complete by:  As directed      Increase activity slowly    Complete by:  As directed      Increase activity slowly    Complete by:  As directed      Increase activity slowly    Complete by:  As directed            Allergies  Allergen Reactions  . Hydrocodone-Acetaminophen Anxiety    Pt nervous and shaky per pt husband  . Penicillins Palpitations  . Cyclobenzaprine Other (See Comments)    Dry mouth  . Sulfa Antibiotics Rash      Disposition: 01-Home or Self Care   Consults:  Interventional radiology     Significant Diagnostic Studies:  Dg Lumbar Spine 2-3 Views  12/17/2014  CLINICAL DATA:  Low back pain. EXAM: LUMBAR SPINE - 2-3 VIEW COMPARISON:  11/15/2011. FINDINGS: Thoracolumbar spine degenerative change and scoliosis. No acute abnormality identified. Diffuse osteopenia. 3.3 cm abdominal aortic  aneurysm noted. Aortic ultrasound can be obtained for further evaluation. Sliding hiatal hernia. Prior cardiac valve replacement. IMPRESSION: 1. Diffuse osteopenia degenerative change with scoliosis concave right. 2. 3.3 cm abdominal aortic aneurysm. Aortic ultrasound can be obtained for further evaluation. 3. Sliding hiatal hernia. Electronically Signed   By: Marcello Moores  Register   On: 12/17/2014 14:56   Dg Pelvis 1-2 Views  12/17/2014  CLINICAL DATA:  Low back pain with bilateral sciatica for 1 week. No reported injury. EXAM: PELVIS - 1-2 VIEW COMPARISON:  April 17, 2014. FINDINGS: Status post right hemiarthroplasty. Prosthesis appears well situated. No fracture or dislocation is noted. Left hip appears normal. IMPRESSION: No acute abnormality seen. Electronically Signed   By: Marijo Conception, M.D.   On: 12/17/2014 14:56   Mr Lumbar Spine W Wo Contrast  12/20/2014  CLINICAL DATA:  Patient fell in parking lot. Pain radiates to RIGHT hip. EXAM: MRI LUMBAR SPINE WITHOUT AND WITH CONTRAST TECHNIQUE: Multiplanar and multiecho pulse sequences of the lumbar spine were obtained without and with intravenous contrast. CONTRAST:  31mL MULTIHANCE GADOBENATE DIMEGLUMINE 529 MG/ML IV SOLN COMPARISON:  Lumbar spine radiograph 12/17/2014. CTA chest and abdomen 12/19/2014. FINDINGS: Segmentation: Normal. Alignment:  Normal except for trace retrolisthesis L1-2. Vertebrae: There is an acute compression fracture of L1, primarily inferior endplate deformity, but also rightward anterior inferior chip fractures, with edema confined to the lower 1/2 of the vertebral body, only minor wedging, and prevertebral soft tissue swelling. As demonstrated to greater detail on CT axial coronal images, the lateral inferior aspect of the vertebral body which is associated with osseous ridging, has fractured on its RIGHT side, and is displaced laterally. Tiny corner fracture anteriorly superiorly at L2, minor compared to the edema throughout L1.  No significant retropulsion or epidural hematoma.No other worrisome osseous lesions. Conus medullaris: Normal in size, and signal. Trace low termination. Paraspinal tissues: No evidence for hydronephrosis. Renal cystic disease, incompletely evaluated. Abdominal aortic ectasia up to 28 mm diameter. Ectatic abdominal aorta at risk for aneurysm development. Recommend followup by ultrasound in 5 years. This recommendation follows ACR consensus guidelines: White Paper of the ACR Incidental Findings Committee II on Vascular Findings. J Am Coll Radiol 2013; 10:789-794. Disc levels: L1-L2: There is mild annular bulging/subligamentous protrusion, and disc osteophyte complex, eccentric to the RIGHT. No definite subarticular zone narrowing or epidural hematoma but asymmetric  RIGHT-sided foraminal narrowing likely irritates the RIGHT L1 nerve root. In addition the RIGHT L1 nerve root is likely displace in its extraforaminal course due to fractures of the RIGHT lateral inferior aspect of that vertebral body. See coronal CTA image 98 series 702, and axial CTA images 141-145 series 701. Pedicles are intact. L2-L3:  Tiny annular rent.  No impingement. L3-L4:  Unremarkable. L4-L5:  Unremarkable. L5-S1:  Unremarkable. Compared with plain films, the fracture is difficult to see due to osteopenia IMPRESSION: Acute compression fracture of L1, consistent with a posttraumatic osteopenic (nonpathologic) injury, with bone marrow edema primarily confined to the inferior 1/2 of the vertebral body. Inferior endplate deformity, with anterior chip fracture. Anterior prevertebral hematoma. The patient's RIGHT hip pain may relate to asymmetric foraminal narrowing at L1-2 on the RIGHT due to disc osteophyte complex as well as displacement of the extraforaminal nerve alongside the injured lateral inferior aspect of L1. See discussion above. Tiny anterior superior corner injury to L2, without wedging. The patient may be a candidate for vertebral  augmentation if conservative measures do not result in improvement. Electronically Signed   By: Staci Righter M.D.   On: 12/20/2014 13:38   Ct Angio Chest Aorta W/cm &/or Wo/cm  12/19/2014  CLINICAL DATA:  81 year old female with abdominal aortic aneurysm and flank pain EXAM: CT ANGIOGRAPHY CHEST, ABDOMEN AND PELVIS TECHNIQUE: Multidetector CT imaging through the chest, abdomen and pelvis was performed using the standard protocol during bolus administration of intravenous contrast. Multiplanar reconstructed images and MIPs were obtained and reviewed to evaluate the vascular anatomy. CONTRAST:  100 mL Omni 350 COMPARISON:  None. FINDINGS: CTA CHEST FINDINGS Mediastinum: Unremarkable CT appearance of the thyroid gland for age. No suspicious mediastinal or hilar adenopathy. No soft tissue mediastinal mass. Large sliding hiatal hernia with nearly completely intra thoracic stomach. Heart/Vascular: Cardiomegaly. Patient is status post median sternotomy with evidence of multivessel CABG including LIMA to LAD bypass. No pericardial effusion. Surgical changes of prior mitral valve annuloplasty. No aortic dissection or aneurysm. Heterogeneous atherosclerotic plaque. Tortuous and elongated transverse and descending thoracic aorta. Four vessel arch. The left vertebral artery arises directly from the aorta. Lungs/Pleura: 6 mm ground-glass attenuation nodular opacity in the right lung apex (image 10 series 706). A 4 mm nodule in the periphery of the right upper lobe (image 21 series 706). There is a 3 mm nodule in the periphery of the left upper lobe on image 26. 5 mm nodule in the periphery of the left lower lobe on image 42. There are a few additional technique scattered 2 mm subpleural nodules. Whole dependent atelectasis in the medial aspect of both lower lobes likely secondary to the large hiatal hernia. Trace scarring versus is bandlike atelectasis in the inferior lingula. Lungs are otherwise clear. No focal  consolidation. Bones/Soft Tissues: No acute fracture or aggressive appearing lytic or blastic osseous lesion. Review of the MIP images confirms the above findings. CTA ABDOMEN AND PELVIS FINDINGS VASCULAR Aorta: Mild ectasia of the infrarenal abdominal aorta with a maximal diameter of 2.6 cm. Her scattered atherosclerotic vascular calcifications but no significant stenosis or evidence of dissection. Celiac: Widely patent. Conventional hepatic arterial anatomy. No visceral artery aneurysm. SMA: Atherosclerotic plaque in the mid portion without significant stenosis. No aneurysm. Renals: Single dominant renal arteries without evidence of significant stenosis, aneurysm or fibromuscular dysplasia. IMA: Patent and unremarkable. Inflow: Minimal atherosclerotic plaque without evidence of aneurysm or significant stenosis. Proximal Outflow: Middle atherosclerotic plaque without evidence of aneurysm or significant stenosis. Veins:  No focal venous abnormality. NON-VASCULAR Abdomen: Intrathoracic stomach due to very large hiatal hernia. Unremarkable CT appearance of the spleen, adrenal glands and pancreas save for mild prominence of the main pancreatic duct. There is no associated atrophy or mass. Normal hepatic contour and morphology. There are at least 4 subcentimeter circumscribed low-attenuation lesions scattered throughout the liver which are too small to characterize but statistically highly likely benign cysts. The main portal veins are patent. Gallbladder is unremarkable. No intra or extrahepatic biliary ductal dilatation. Unremarkable appearance of the bilateral kidneys. No focal solid lesion, hydronephrosis or nephrolithiasis. Small probable simple cyst upper pole left kidney. Extensive colonic diverticulosis. No evidence of active inflammation. No evidence of bowel obstruction or focal bowel wall thickening. Moderate volume of formed stool in the rectum. Pelvis: Surgical changes of prior hysterectomy. The bladder is  distended with urine. Streak artifact from right total hip arthroplasty slightly limits evaluation. Bones/Soft Tissues: Stable chronic T7 compression fracture with approximately 60% height loss. Age indeterminate compression fracture of the inferior endplate of L1 with was 20% height loss. Multilevel degenerative disc disease. Generalized demineralization. Review of the MIP images confirms the above findings. IMPRESSION: CTA CHEST 1. No evidence of aortic aneurysm or dissection. 2. Massive hiatal hernia with nearly completely intra thoracic stomach. 3. Atherosclerosis and multivessel coronary artery calcifications. 4. Surgical changes of prior multivessel CABG happened mitral valve annuloplasty. 5. Cardiomegaly. 6. Multiple bilateral pulmonary nodules measuring up to 5 mm. In the absence of a known primary malignancy these likely represent sequelae of prior infection/inflammation or a granulomatous process. Consider follow-up CT scan of the chest in 1 year to confirm stability. 7. Chronic T7 compression fracture. CTA ABD/PELVIS 1. Fusiform ectasia of the infrarenal abdominal aorta with maximum transverse to the 2.6 cm. Ectatic abdominal aorta at risk for aneurysm development. Recommend followup by ultrasound in 5 years. This recommendation follows ACR consensus guidelines: White Paper of the ACR Incidental Findings Committee II on Vascular Findings. J Am Coll Radiol 2013; 10:789-794. 2. Extensive colonic diverticulosis without evidence of active inflammation. 3. Moderate volume retained stool in the rectum suggests an element of constipation. 4. Age indeterminate compression fracture of the inferior endplate of L1. Signed, Criselda Peaches, MD [facility] Electronically Signed   By: Jacqulynn Cadet M.D.   On: 12/19/2014 11:34   Ct Angio Abd/pel W/ And/or W/o  12/19/2014  CLINICAL DATA:  81 year old female with abdominal aortic aneurysm and flank  pain EXAM: CT ANGIOGRAPHY CHEST, ABDOMEN AND PELVIS TECHNIQUE: Multidetector CT imaging through the chest, abdomen and pelvis was performed using the standard protocol during bolus administration of intravenous contrast. Multiplanar reconstructed images and MIPs were obtained and reviewed to evaluate the vascular anatomy. CONTRAST:  100 mL Omni 350 COMPARISON:  None. FINDINGS: CTA CHEST FINDINGS Mediastinum: Unremarkable CT appearance of the thyroid gland for age. No suspicious mediastinal or hilar adenopathy. No soft tissue mediastinal mass. Large sliding hiatal hernia with nearly completely intra thoracic stomach. Heart/Vascular: Cardiomegaly. Patient is status post median sternotomy with evidence of multivessel CABG including LIMA to LAD bypass. No pericardial effusion. Surgical changes of prior mitral valve annuloplasty. No aortic dissection or aneurysm. Heterogeneous atherosclerotic plaque. Tortuous and elongated transverse and descending thoracic aorta. Four vessel arch. The left vertebral artery arises directly from the aorta. Lungs/Pleura: 6 mm ground-glass attenuation nodular opacity in the right lung apex (image 10 series 706). A 4 mm nodule in the periphery of the right upper lobe (image 21 series 706).  There is a 3 mm nodule in the periphery of the left upper lobe on image 26. 5 mm nodule in the periphery of the left lower lobe on image 42. There are a few additional technique scattered 2 mm subpleural nodules. Whole dependent atelectasis in the medial aspect of both lower lobes likely secondary to the large hiatal hernia. Trace scarring versus is bandlike atelectasis in the inferior lingula. Lungs are otherwise clear. No focal consolidation. Bones/Soft Tissues: No acute fracture or aggressive appearing lytic or blastic osseous lesion. Review of the MIP images confirms the above findings. CTA ABDOMEN AND PELVIS FINDINGS VASCULAR Aorta: Mild ectasia of the infrarenal abdominal aorta with a maximal  diameter of 2.6 cm. Her scattered atherosclerotic vascular calcifications but no significant stenosis or evidence of dissection. Celiac: Widely patent. Conventional hepatic arterial anatomy. No visceral artery aneurysm. SMA: Atherosclerotic plaque in the mid portion without significant stenosis. No aneurysm. Renals: Single dominant renal arteries without evidence of significant stenosis, aneurysm or fibromuscular dysplasia. IMA: Patent and unremarkable. Inflow: Minimal atherosclerotic plaque without evidence of aneurysm or significant stenosis. Proximal Outflow: Middle atherosclerotic plaque without evidence of aneurysm or significant stenosis. Veins: No focal venous abnormality. NON-VASCULAR Abdomen: Intrathoracic stomach due to very large hiatal hernia. Unremarkable CT appearance of the spleen, adrenal glands and pancreas save for mild prominence of the main pancreatic duct. There is no associated atrophy or mass. Normal hepatic contour and morphology. There are at least 4 subcentimeter circumscribed low-attenuation lesions scattered throughout the liver which are too small to characterize but statistically highly likely benign cysts. The main portal veins are patent. Gallbladder is unremarkable. No intra or extrahepatic biliary ductal dilatation. Unremarkable appearance of the bilateral kidneys. No focal solid lesion, hydronephrosis or nephrolithiasis. Small probable simple cyst upper pole left kidney. Extensive colonic diverticulosis. No evidence of active inflammation. No evidence of bowel obstruction or focal bowel wall thickening. Moderate volume of formed stool in the rectum. Pelvis: Surgical changes of prior hysterectomy. The bladder is distended with urine. Streak artifact from right total hip arthroplasty slightly limits evaluation. Bones/Soft Tissues: Stable chronic T7 compression fracture with approximately 60% height loss. Age indeterminate compression fracture of the inferior endplate of L1 with was  20% height loss. Multilevel degenerative disc disease. Generalized demineralization. Review of the MIP images confirms the above findings. IMPRESSION: CTA CHEST 1. No evidence of aortic aneurysm or dissection. 2. Massive hiatal hernia with nearly completely intra thoracic stomach. 3. Atherosclerosis and multivessel coronary artery calcifications. 4. Surgical changes of prior multivessel CABG happened mitral valve annuloplasty. 5. Cardiomegaly. 6. Multiple bilateral pulmonary nodules measuring up to 5 mm. In the absence of a known primary malignancy these likely represent sequelae of prior infection/inflammation or a granulomatous process. Consider follow-up CT scan of the chest in 1 year to confirm stability. 7. Chronic T7 compression fracture. CTA ABD/PELVIS 1. Fusiform ectasia of the infrarenal abdominal aorta with maximum transverse to the 2.6 cm. Ectatic abdominal aorta at risk for aneurysm development. Recommend followup by ultrasound in 5 years. This recommendation follows ACR consensus guidelines: White Paper of the ACR Incidental Findings Committee II on Vascular Findings. J Am Coll Radiol 2013; 10:789-794. 2. Extensive colonic diverticulosis without evidence of active inflammation. 3. Moderate volume retained stool in the rectum suggests an element of constipation. 4. Age indeterminate compression fracture of the inferior endplate of L1. Signed, Criselda Peaches, MD [facility] Radiology Electronically Signed   By: Jacqulynn Cadet M.D.   On: 12/19/2014 11:34  Filed Weights   12/19/14 1553 12/21/14 0518 12/22/14 0342  Weight: 43.409 kg (95 lb 11.2 oz) 43.7 kg (96 lb 5.5 oz) 43.364 kg (95 lb 9.6 oz)     Microbiology: No results found for this or any previous visit (from the past 240 hour(s)).     Blood Culture    Component Value Date/Time   SDES URINE, CATHETERIZED 04/17/2014 0852   SPECREQUEST Normal 04/17/2014 0852   CULT NO  GROWTH Performed at [facility]  04/17/2014 0852   REPTSTATUS 04/18/2014 FINAL 04/17/2014 0852      Labs: Results for orders placed or performed during the hospital encounter of 12/19/14 (from the past 48 hour(s))  Urinalysis, Routine w reflex microscopic (not at [facility])     Status: Abnormal   Collection Time: 12/23/14  9:26 PM  Result Value Ref Range   Color, Urine YELLOW YELLOW   APPearance CLOUDY (A) CLEAR   Specific Gravity, Urine 1.018 1.005 - 1.030   pH 7.0 5.0 - 8.0   Glucose, UA NEGATIVE NEGATIVE mg/dL   Hgb urine dipstick NEGATIVE NEGATIVE   Bilirubin Urine NEGATIVE NEGATIVE   Ketones, ur 15 (A) NEGATIVE mg/dL   Protein, ur NEGATIVE NEGATIVE mg/dL   Nitrite NEGATIVE NEGATIVE   Leukocytes, UA NEGATIVE NEGATIVE    Comment: MICROSCOPIC NOT DONE ON URINES WITH NEGATIVE PROTEIN, BLOOD, LEUKOCYTES, NITRITE, OR GLUCOSE <1000 mg/dL.  CBC with Differential/Platelet     Status: Abnormal   Collection Time: 12/24/14  2:50 AM  Result Value Ref Range   WBC 12.7 (H) 4.0 - 10.5 K/uL   RBC 4.33 3.87 - 5.11 MIL/uL   Hemoglobin 12.8 12.0 - 15.0 g/dL   HCT 38.9 36.0 - 46.0 %   MCV 89.8 78.0 - 100.0 fL   MCH 29.6 26.0 - 34.0 pg   MCHC 32.9 30.0 - 36.0 g/dL   RDW 14.0 11.5 - 15.5 %   Platelets 208 150 - 400 K/uL   Neutrophils Relative % 28 %   Lymphocytes Relative 67 %   Monocytes Relative 5 %   Eosinophils Relative 0 %   Basophils Relative 0 %   Neutro Abs 3.6 1.7 - 7.7 K/uL   Lymphs Abs 8.5 (H) 0.7 - 4.0 K/uL   Monocytes Absolute 0.6 0.1 - 1.0 K/uL   Eosinophils Absolute 0.0 0.0 - 0.7 K/uL   Basophils Absolute 0.0 0.0 - 0.1 K/uL   WBC Morphology ATYPICAL LYMPHOCYTES     Comment: Performed at [facility]     Lipid Panel     Component Value Date/Time   CHOL 172 11/16/2013 0953   TRIG 83.0 11/16/2013 0953   HDL 53.00 11/16/2013 0953   CHOLHDL 3 11/16/2013 0953   VLDL 16.6 11/16/2013 0953   LDLCALC 102* 11/16/2013 0953     Lab Results  Component  Value Date   HGBA1C 5.9* 10/08/2011     Lab Results  Component Value Date   LDLCALC 102* 11/16/2013   CREATININE 0.68 12/23/2014    History of present illness 81 y.o. female with PMH of dCHF, CAD, ischemic heart disease status post CABG 2013, history of mitral valve repair and angioplasty since emergency department with one-week history of intractable back pain.  -admitted with L1 compression fracture, symptomatic bradycardia, pancreatitis.   Assessment and plan 1. Bradycardia. She was having pauses up to 4 seconds and the next beat would be junctional, not sinus bradycardia would precede the nausea. Likely Vagal. resolved off of Toprol (50mg  QD). Cardiac  enzymes negative. Cardiology recommended to DC metoprolol completely, however patient developed sinus tachycardia secondary to pain and the husband was insistent on giving the patient some metoprolol. Therefore metoprolol restarted at 6.25 mg by mouth twice a day Normal TSH No indication for pacer at this time as per cardiology - OK to proceed with Kyphoplasty per cardiology     #2 probable pancreatitis. Lipase 296, now 41, no clinical signs of acute pancreatitis, nausea resolved CT with mild prominence of main pancreatic duct -Denies abdominal pain, no nausea or vomiting, no EtOH use, no transaminitis Continue soft diet Aggressive constipation regimen   #3. Intractable back pain. Likely related to compression fracture at L1 TLSO brace, MRI of the lumbar spine,IR consultation for vertebroplasty, delayed insurance authorization delayed procedure Continue Robaxin and oral pain medication , patient received IV Solu-Medrol 5 days with significantly improved her pain Suspect patient will not need steroids after kyphoplasty, as this can interfere with healing Patient refused to ambulate with physical therapy after kyphoplasty, refused home health Anticipate discharge home today  #4. Hypertension -Controlled in the emergency  department -Hold home beta blocker -Continue ACE inhibitor, increased dose of lisinopril to 10 mg  #5. Hypokalemia.Resolved  #6. CAD. No chest pain. Initial troponin at baseline. History of ischemic heart disease status post CABG 2013 as well as mitral valve repair angioplasty 2013 Stable, cardiology following -Last echo last year with the EF of 60%  #7. chronic Diastolic heart failureWithout exacerbation -Appears euvolemic -We'll monitor intake and output obtain daily weights -EF last year 60%  #8. Aortic aneurysm. Incidental finding on imaging done outpatient. Today CT negative for aortic aneurysm dissection Patient would need outpatient follow-up for this aortic aneurysm   #9. Pulmonary nodules per CT -Incidental findings Will need outpatient follow-up     Discharge Exam:    Blood pressure 140/74, pulse 78, temperature 98.2 F (36.8 C), temperature source Oral, resp. rate 18, height 5\' 5"  (1.651 m), weight 43.364 kg (95 lb 9.6 oz), SpO2 96 %.  General: Very uncomfortable because of low back pain Lungs: Clear to auscultation bilaterally without wheezes or crackles Cardiovascular: Regular rate and rhythm without murmur gallop or rub normal S1 and S2 Abdomen: Nontender, nondistended, soft, bowel sounds positive, no rebound, no ascites, no appreciable mass Extremities: No significant cyanosis, clubbing, or edema bilateral lower extremities, increased paraspinal muscle spasm     Follow-up Information    Follow up with Vidal Schwalbe, MD. Schedule an appointment as soon as possible for a visit in 3 days.   Specialty:  Family Medicine   Why:  please call to schedule, unable to reach befor d/c   Contact information:   [place] [address] [phone]       Signed: Reyne Dumas 12/25/2014, 9:49 AM        Time spent >45 mins

## 2014-12-25 NOTE — Care Management Note (Addendum)
Case Management Note  Patient Details  Name: MEHAN STICKA MRN: [id number] Date of Birth: 11/02/1927  Subjective/Objective:                    Action/Plan:  Pt is from home with husband.  Pt is awaiting approval from insurance for scheduled procedure,    Expected Discharge Date:                  Expected Discharge Plan:  [place]  In-House Referral:  Clinical Social Work  Discharge planning Services  CM Consult  Post Acute Care Choice:    Choice offered to:     DME Arranged:    DME Agency:     HH Arranged:    [place] Agency:     Status of Service:  In process, will continue to follow  Medicare Important Message Given:  Yes Date Medicare IM Given:    Medicare IM give by:    Date Additional Medicare IM Given:    Additional Medicare Important Message give by:     If discussed at [place] of Stay Meetings, dates discussed:    Additional Comments: 12/25/2014  Pt discharged home with husband.  1234:  Pt is actively having BM, pt will discharge home with husband shortly.  Pt/husband continue to refuse recommended [facility]  Per MD during Progession rounds; pt has not had BM in 7 days, MD wrote orders for pt to receive laxatives/suppositories.  Medications adminitered before 9am.  CM will continue to follow   Pt did not discharge home yesterday evening as planned.  CM will follow up  12/24/14 17:15 CM spoke with pt and husband post procedure, both were awaiting clearance of bedrest to discharge to home 12/24/14, both still refused recommended HH.    Pt has procedure performed 12/24/14; plan was discussed with pt/husband/attending that pt would discharge post produre  CM spoke with pt and husband in depth regarding recommended HH, both adamately refused.  CM explained the concern with pt returning home without PT evaluation and without [place] visits, pt stated  "I had it before and I do not believe I need it now", husband stated "its not needed".  CM informed both pt  and husband that if [facility] is desired post discharge to simply ask PCP to arrange.  CM questioned pt and husband about possible DME needs, pt husband stated " we only need a wheelchair to get her to the front door, we have everything else we need at home".  PT re-evaluation was refused by pt/husband.  CSW will meet with pt/husband regarding safe discharge plan.  12/23/14  Please see CSW note regarding consult with SNF placement  CM assessed pt with husband at bedside.  While pt remained calm and cooperative; Pts husband communicated that he was very frustrated in the delay Kyphoplasty procedure, awaiting insurance approval.  CM, bedside nurse, unit charge nurse and director contacted IR to inquire of approval process/delay in procedure.  CM contacted Physician Advisor to discuss delay and discharge planning.  CM also spoke with attending and IR PA regarding delay and possibility of discharge while awaiting for insurance approval due to lack of intensity.  During the conversation with pt regarding possible discharge awaiting insurance approval;  Pts husband stated he would not be able to take pt home in current condition awaiting insurance approval due to lack of mobility and pain. Pt is now on PO PRN pain medication.   CM consulted with CSW  regarding possible SNF consult post PT reevaluation in the am of 12/24/14; however pts husband adamantly refused SNF placement.    Plan agreed to by MD; IR PA, Medical Advisor,pt and husband witnessed by director at pts bedside:  Pt will be evaluated early am 12/24/14 by in house therapy to determine safe discharge plan to be executed on 12/24/14 if insurance approval is not been provided by mid day on 12/24/14.  Again, pt remained calm and  asked husband to calm down, pt stated "he is just hard headed".  Maryclare Labrador, RN 12/25/2014, 8:50 AM

## 2014-12-25 NOTE — Progress Notes (Signed)
Occupational Therapy Evaluation Patient Details Name: Victoria Sexton MRN: [id number] DOB: 06-24-1927 Today's Date: 12/25/2014    History of Present Illness Patient is a 81 y/o female with hx of diastolic heart failure, CAD, ischemic heart disease s/p CABG 2013, history of MVR and angioplasty presents with intractable back pain.MRI of the lumbar spine-acute compression fracture at L1 in addition to some asymmetric foraminal narrowing at L1 -2 on the right due to disc osteophyte complex as well as displacement of the extraforaminal nerve alongside the injured lateral inferior aspect of L1. Plan for possible L1 vertebroplasty/kyphoplasty.   Clinical Impression   Pt admitted with the above diagnoses and presents with below problem list. Pt will benefit from continued OT to address the below listed deficits and maximize independence with BADLs prior to d/c to venue below. PTA pt was independent with ADLs. Pt is currently min guard to min A with LB ADLs, toilet transfers, and in-room functional mobility. Spouse present and included in education. Session details below. OT to continue to follow acutely.      Follow Up Recommendations  Supervision/Assistance - 24 hour;Home health OT    Equipment Recommendations  None recommended by OT    Recommendations for Other Services       Precautions / Restrictions Precautions Precautions: Back Precaution Booklet Issued: No Precaution Comments: Reviewed back precautions and positioning. Restrictions Weight Bearing Restrictions: No      Mobility Bed Mobility Overal bed mobility: Needs Assistance Bed Mobility: Sidelying to Sit;Sit to Sidelying   Sidelying to sit: Min assist     Sit to sidelying: Min assist General bed mobility comments: Min A to maintain precautions and advance BLE onto bed. Min A to advance trunk to EOB. Spouse present and included in education.   Transfers Overall transfer level: Needs assistance Equipment used: None;Rolling  walker (2 wheeled) Transfers: Sit to/from Stand Sit to Stand: Min guard;Min assist         General transfer comment: min guard from [facility] with rw, min steadying assist from EOB at lowest height with no rw.    Balance Overall balance assessment: Needs assistance Sitting-balance support: No upper extremity supported;Feet supported Sitting balance-Leahy Scale: Fair Sitting balance - Comments: noted to use extremity support often in sitting position   Standing balance support: No upper extremity supported;Bilateral upper extremity supported Standing balance-Leahy Scale: Fair Standing balance comment: able to maintain static standing position with no external support. seeks external support during dynamic standing.                            ADL Overall ADL's : Needs assistance/impaired Eating/Feeding: Set up;Sitting   Grooming: Min guard;Standing   Upper Body Bathing: Set up;Cueing for compensatory techniques;Sitting   Lower Body Bathing: Minimal assistance;Sit to/from stand;Cueing for compensatory techniques;Cueing for back precautions   Upper Body Dressing : Set up;Sitting;Cueing for compensatory techniques   Lower Body Dressing: Minimal assistance;Cueing for compensatory techniques;Cueing for back precautions;Sit to/from stand   Toilet Transfer: Min guard;Minimal assistance;Ambulation;BSC;RW Toilet Transfer Details (indicate cue type and reason): Min steadying assist at times Toileting- Clothing Manipulation and Hygiene: Sitting/lateral lean;Set up;Cueing for back precautions   Tub/ Shower Transfer: Walk-in shower;Min guard;Minimal assistance;Ambulation;3 in 1;Rolling walker Tub/Shower Transfer Details (indicate cue type and reason): close min guard light steadying assist Functional mobility during ADLs: Minimal assistance General ADL Comments: Completed toilet transfer and in-room functional mobility close min guard to light steadying assist. Completed LB dressing  with min  A to access feet without bending. Educated spouse and pt on ADLs with back precautions. Pt practiced bed mobility with min A. Educated on home setup and fall prevention as well as AE.      Vision     Perception     Praxis      Pertinent Vitals/Pain Faces Pain Scale: Hurts little more Pain Location: right side of back Pain Intervention(s): Limited activity within patient's tolerance;Monitored during session;Repositioned     Hand Dominance Right   Extremity/Trunk Assessment Upper Extremity Assessment Upper Extremity Assessment: Generalized weakness   Lower Extremity Assessment Lower Extremity Assessment: Defer to PT evaluation   Cervical / Trunk Assessment Cervical / Trunk Assessment: Kyphotic   Communication Communication Communication: No difficulties   Cognition Arousal/Alertness: Awake/alert Behavior During Therapy: WFL for tasks assessed/performed Overall Cognitive Status: Within Functional Limits for tasks assessed                     General Comments       Exercises       Shoulder Instructions      Home Living Family/patient expects to be discharged to:: Private residence Living Arrangements: Spouse/significant other Available Help at Discharge: Family;Available 24 hours/day Type of Home: House Home Access: Stairs to enter [employer] of Steps: 5 Entrance Stairs-Rails: Can reach both Home Layout: Bed/bath upstairs;Two level Alternate Level Stairs-Number of Steps: 14 Alternate Level Stairs-Rails: Right;Left Bathroom Shower/Tub: Walk-in shower         Home Equipment: Environmental consultant - 2 wheels;Cane - single point;Bedside commode;Wheelchair - manual   Additional Comments: Plans to have wife sleep in recliner on first level at home initially.       Prior Functioning/Environment Level of Independence: Independent             OT Diagnosis: Generalized weakness;Acute pain   OT Problem List: Impaired balance (sitting and/or  standing);Decreased knowledge of use of DME or AE;Decreased knowledge of precautions;Pain   OT Treatment/Interventions: Self-care/ADL training;DME and/or AE instruction;Therapeutic activities;Patient/family education;Balance training    OT Goals(Current goals can be found in the care plan section) Acute Rehab OT Goals Patient Stated Goal: to get this pain under control and return to independence OT Goal Formulation: With patient/family Time For Goal Achievement: 01/01/15 Potential to Achieve Goals: Good ADL Goals Pt Will Perform Lower Body Bathing: with modified independence;with adaptive equipment;sit to/from stand Pt Will Perform Lower Body Dressing: with modified independence;with adaptive equipment;sit to/from stand Pt Will Transfer to Toilet: with modified independence;ambulating (3n1 over toilet) Pt Will Perform Toileting - Clothing Manipulation and hygiene: with modified independence;sitting/lateral leans Pt Will Perform Tub/Shower Transfer: Shower transfer;with modified independence;ambulating;3 in 1;rolling walker Additional ADL Goal #1: Pt will complete bed mobility with mod I to prepare for OOB ADLs.   OT Frequency: Min 2X/week   Barriers to D/C:            Co-evaluation              End of Session Equipment Utilized During Treatment: Gait belt;Rolling walker Nurse Communication: Other (comment) (nurse present for part of session)  Activity Tolerance: Patient tolerated treatment well;Patient limited by fatigue Patient left: in chair;with call bell/phone within reach;with family/visitor present   Time: 1000-1032 OT Time Calculation (min): 32 min Charges:  OT General Charges $OT Visit: 1 Procedure OT Evaluation $Initial OT Evaluation Tier I: 1 Procedure OT Treatments $Self Care/Home Management : 8-22 mins G-Codes:    Hortencia Pilar 01-01-2015, 10:49 AM

## 2014-12-25 NOTE — Discharge Instructions (Signed)
Refer to this sheet in the next few weeks. These instructions provide you with information about caring for yourself after your procedure. Your health care provider may also give you more specific instructions. Your treatment has been planned according to current medical practices, but problems sometimes occur. Call your health care provider if you have any problems or questions after your procedure. WHAT TO EXPECT AFTER THE PROCEDURE After your procedure, it is common to have back pain. HOME CARE INSTRUCTIONS Incision Care  Follow instructions from your health care provider about how to take care of your incisions. Make sure you:  Wash your hands with soap and water before you change your bandage (dressing). If soap and water are not available, use hand sanitizer.  Change your dressing as told by your health care provider.  Leave stitches (sutures), skin glue, or adhesive strips in place. These skin closures may need to be in place for 2 weeks or longer. If adhesive strip edges start to loosen and curl up, you may trim the loose edges. Do not remove adhesive strips completely unless your health care provider tells you to do that.  Check your incision area every day for signs of infection. Watch for:  Redness, swelling, or pain.  Fluid, blood, or pus.  Keep your dressing dry until your health care provider says that it can be removed. Activities  Rest your back and avoid intense physical activity for as long as told by your health care provider.  Return to your normal activities as told by your health care provider. Ask your health care provider what activities are safe for you.  Do not lift anything that is heavier than 10 lb (4.5 kg). This is about the weight of a gallon of milk.You may need to avoid heavy lifting for several weeks. General Instructions  Take over-the-counter and prescription medicines only as told by your health care provider.  If directed, apply ice to the  painful area:  Put ice in a plastic bag.  Place a towel between your skin and the bag.  Leave the ice on for 20 minutes, 2-3 times per day.  Do not use tobacco products, including cigarettes, chewing tobacco, or e-cigarettes. If you need help quitting, ask your health care provider.  Keep all follow-up visits as told by your health care provider. This is important. SEEK MEDICAL CARE IF:  You have a fever.  You have redness, swelling, or pain at the site of your incisions.  You have fluid, blood, or pus coming from your incisions.  You have pain that gets worse or does not get better with medicine.  You develop numbness or weakness in any part of your body. SEEK IMMEDIATE MEDICAL CARE IF:   You have chest pain.  You have difficulty breathing.  You cannot move your legs.  You cannot control your bladder or bowel movements.  You suddenly become weak or numb on one side of your body.  You become very confused.  You have trouble speaking or understanding, or both.    This information is not intended to replace advice given to you by your health care provider. Make sure you discuss any questions you have with your health care provider.   Document Released: 09/11/2014 Document Reviewed: 04/15/2014 Elsevier Interactive Patient Education [employer].

## 2014-12-25 NOTE — Progress Notes (Signed)
Order received to discharge.  Telemetry removed and CCMD notified.  IV removed with catheter intact.  Pt had significant bowel movement before discharge.  Pt education given to Pt with husband at bedside.  All questions answered.  Specific instruction given on incision care to back s/p procedure.  Notified to observe for s/s of infection and report to PCP.  Husband and Pt indicate understanding.  Pt discharged via [facility] with this RN and assisted into vehicle.  Pt denies pain at time of discharge, no sob or chest pain.  Pt stable at time of discharge.

## 2014-12-25 NOTE — Progress Notes (Signed)
Pt doing better Has been OOB, moving around with RW Pain betterr, but sore from procedure.  BP 140/74 mmHg  Pulse 78  Temp(Src) 98.2 F (36.8 C) (Oral)  Resp 18  Ht 5\' 5"  (1.651 m)  Wt 95 lb 9.6 oz (43.364 kg)  BMI 15.91 kg/m2  SpO2 96% Site clean, dressing with dry blood stain, no hematoma, minimally tender   S/p L1 KP So far good result with improved pain and mobility Ok for DC Instructions put in DC section, RW at all times.   Ascencion Dike PA-C Interventional Radiology 12/25/2014 11:11 AM

## 2014-12-26 ENCOUNTER — Other Ambulatory Visit: Payer: Self-pay | Admitting: Cardiology

## 2014-12-27 DIAGNOSIS — S32010G Wedge compression fracture of first lumbar vertebra, subsequent encounter for fracture with delayed healing: Secondary | ICD-10-CM | POA: Diagnosis not present

## 2014-12-27 DIAGNOSIS — I77819 Aortic ectasia, unspecified site: Secondary | ICD-10-CM | POA: Diagnosis not present

## 2014-12-27 DIAGNOSIS — I10 Essential (primary) hypertension: Secondary | ICD-10-CM | POA: Diagnosis not present

## 2014-12-27 DIAGNOSIS — E46 Unspecified protein-calorie malnutrition: Secondary | ICD-10-CM | POA: Diagnosis not present

## 2014-12-27 DIAGNOSIS — R918 Other nonspecific abnormal finding of lung field: Secondary | ICD-10-CM | POA: Diagnosis not present

## 2014-12-31 ENCOUNTER — Other Ambulatory Visit (HOSPITAL_COMMUNITY): Payer: Self-pay | Admitting: Interventional Radiology

## 2014-12-31 DIAGNOSIS — IMO0002 Reserved for concepts with insufficient information to code with codable children: Secondary | ICD-10-CM

## 2015-01-23 ENCOUNTER — Ambulatory Visit (HOSPITAL_COMMUNITY): Admission: RE | Admit: 2015-01-23 | Payer: Medicare Other | Source: Ambulatory Visit

## 2015-02-24 ENCOUNTER — Ambulatory Visit: Payer: Medicare Other | Admitting: Diagnostic Neuroimaging

## 2015-04-02 ENCOUNTER — Emergency Department (HOSPITAL_COMMUNITY): Payer: Medicare Other

## 2015-04-02 ENCOUNTER — Emergency Department (HOSPITAL_COMMUNITY)
Admission: EM | Admit: 2015-04-02 | Discharge: 2015-04-02 | Disposition: A | Discharge status: Home / Self Care | Payer: Medicare Other | Attending: Emergency Medicine | Admitting: Emergency Medicine

## 2015-04-02 ENCOUNTER — Encounter (HOSPITAL_COMMUNITY): Payer: Self-pay | Admitting: Emergency Medicine

## 2015-04-02 DIAGNOSIS — J209 Acute bronchitis, unspecified: Secondary | ICD-10-CM | POA: Insufficient documentation

## 2015-04-02 DIAGNOSIS — Z8781 Personal history of (healed) traumatic fracture: Secondary | ICD-10-CM | POA: Insufficient documentation

## 2015-04-02 DIAGNOSIS — R410 Disorientation, unspecified: Secondary | ICD-10-CM | POA: Insufficient documentation

## 2015-04-02 DIAGNOSIS — Z9889 Other specified postprocedural states: Secondary | ICD-10-CM | POA: Insufficient documentation

## 2015-04-02 DIAGNOSIS — Z8719 Personal history of other diseases of the digestive system: Secondary | ICD-10-CM | POA: Diagnosis not present

## 2015-04-02 DIAGNOSIS — R531 Weakness: Secondary | ICD-10-CM | POA: Diagnosis not present

## 2015-04-02 DIAGNOSIS — R011 Cardiac murmur, unspecified: Secondary | ICD-10-CM | POA: Insufficient documentation

## 2015-04-02 DIAGNOSIS — Z880 Allergy status to penicillin: Secondary | ICD-10-CM | POA: Diagnosis not present

## 2015-04-02 DIAGNOSIS — Z87891 Personal history of nicotine dependence: Secondary | ICD-10-CM | POA: Insufficient documentation

## 2015-04-02 DIAGNOSIS — M810 Age-related osteoporosis without current pathological fracture: Secondary | ICD-10-CM | POA: Insufficient documentation

## 2015-04-02 DIAGNOSIS — I509 Heart failure, unspecified: Secondary | ICD-10-CM | POA: Insufficient documentation

## 2015-04-02 DIAGNOSIS — Z7982 Long term (current) use of aspirin: Secondary | ICD-10-CM | POA: Diagnosis not present

## 2015-04-02 DIAGNOSIS — I10 Essential (primary) hypertension: Secondary | ICD-10-CM | POA: Diagnosis not present

## 2015-04-02 DIAGNOSIS — Z79899 Other long term (current) drug therapy: Secondary | ICD-10-CM | POA: Insufficient documentation

## 2015-04-02 DIAGNOSIS — R404 Transient alteration of awareness: Secondary | ICD-10-CM | POA: Diagnosis not present

## 2015-04-02 DIAGNOSIS — I25119 Atherosclerotic heart disease of native coronary artery with unspecified angina pectoris: Secondary | ICD-10-CM | POA: Diagnosis not present

## 2015-04-02 DIAGNOSIS — J40 Bronchitis, not specified as acute or chronic: Secondary | ICD-10-CM

## 2015-04-02 DIAGNOSIS — Z951 Presence of aortocoronary bypass graft: Secondary | ICD-10-CM | POA: Insufficient documentation

## 2015-04-02 DIAGNOSIS — R402411 Glasgow coma scale score 13-15, in the field [EMT or ambulance]: Secondary | ICD-10-CM | POA: Diagnosis not present

## 2015-04-02 DIAGNOSIS — Z85828 Personal history of other malignant neoplasm of skin: Secondary | ICD-10-CM | POA: Diagnosis not present

## 2015-04-02 HISTORY — DX: Wedge compression fracture of unspecified lumbar vertebra, initial encounter for closed fracture: S32.000A

## 2015-04-02 LAB — CBC WITH DIFFERENTIAL/PLATELET
BASOS PCT: 0 %
Basophils Absolute: 0 10*3/uL (ref 0.0–0.1)
EOS ABS: 0 10*3/uL (ref 0.0–0.7)
Eosinophils Relative: 0 %
HEMATOCRIT: 38.2 % (ref 36.0–46.0)
Hemoglobin: 12.9 g/dL (ref 12.0–15.0)
Lymphocytes Relative: 50 %
Lymphs Abs: 2.7 10*3/uL (ref 0.7–4.0)
MCH: 28.5 pg (ref 26.0–34.0)
MCHC: 33.8 g/dL (ref 30.0–36.0)
MCV: 84.5 fL (ref 78.0–100.0)
MONO ABS: 0.3 10*3/uL (ref 0.1–1.0)
MONOS PCT: 5 %
NEUTROS ABS: 2.5 10*3/uL (ref 1.7–7.7)
Neutrophils Relative %: 45 %
Platelets: 152 10*3/uL (ref 150–400)
RBC: 4.52 MIL/uL (ref 3.87–5.11)
RDW: 14.4 % (ref 11.5–15.5)
WBC: 5.5 10*3/uL (ref 4.0–10.5)

## 2015-04-02 LAB — I-STAT CG4 LACTIC ACID, ED: Lactic Acid, Venous: 1.35 mmol/L (ref 0.5–2.0)

## 2015-04-02 LAB — URINALYSIS, ROUTINE W REFLEX MICROSCOPIC
BILIRUBIN URINE: NEGATIVE
Glucose, UA: NEGATIVE mg/dL
Hgb urine dipstick: NEGATIVE
Ketones, ur: 15 mg/dL — AB
Leukocytes, UA: NEGATIVE
NITRITE: NEGATIVE
PH: 8 (ref 5.0–8.0)
Protein, ur: NEGATIVE mg/dL
SPECIFIC GRAVITY, URINE: 1.014 (ref 1.005–1.030)

## 2015-04-02 LAB — COMPREHENSIVE METABOLIC PANEL
ALBUMIN: 3.6 g/dL (ref 3.5–5.0)
ALK PHOS: 85 U/L (ref 38–126)
ALT: 14 U/L (ref 14–54)
AST: 27 U/L (ref 15–41)
Anion gap: 10 (ref 5–15)
BUN: 13 mg/dL (ref 6–20)
CALCIUM: 8.5 mg/dL — AB (ref 8.9–10.3)
CO2: 21 mmol/L — AB (ref 22–32)
CREATININE: 0.52 mg/dL (ref 0.44–1.00)
Chloride: 106 mmol/L (ref 101–111)
GFR calc Af Amer: >60 mL/min (ref >60)
GFR calc non Af Amer: >60 mL/min (ref >60)
GLUCOSE: 119 mg/dL — AB (ref 65–99)
Potassium: 3.8 mmol/L (ref 3.5–5.1)
SODIUM: 137 mmol/L (ref 135–145)
Total Bilirubin: 0.2 mg/dL — ABNORMAL LOW (ref 0.3–1.2)
Total Protein: 6.9 g/dL (ref 6.5–8.1)

## 2015-04-02 LAB — I-STAT TROPONIN, ED: TROPONIN I, POC: 0 ng/mL (ref 0.00–0.08)

## 2015-04-02 LAB — CBG MONITORING, ED: GLUCOSE-CAPILLARY: 116 mg/dL — AB (ref 65–99)

## 2015-04-02 MED ORDER — SODIUM CHLORIDE 0.9 % IV BOLUS (SEPSIS)
1000.0000 mL | Freq: Once | INTRAVENOUS | Status: AC
  Administered 2015-04-02: 1000 mL via INTRAVENOUS

## 2015-04-02 MED ORDER — ALBUTEROL SULFATE HFA 108 (90 BASE) MCG/ACT IN AERS
2.0000 | INHALATION_SPRAY | Freq: Once | RESPIRATORY_TRACT | Status: AC
  Administered 2015-04-02: 2 via RESPIRATORY_TRACT
  Filled 2015-04-02: qty 6.7

## 2015-04-02 MED ORDER — METOPROLOL TARTRATE 25 MG PO TABS
12.5000 mg | ORAL_TABLET | Freq: Once | ORAL | Status: AC
Start: 2015-04-02 — End: 2015-04-02
  Administered 2015-04-02: 12.5 mg via ORAL

## 2015-04-02 MED ORDER — AZITHROMYCIN 250 MG PO TABS: 250.0000 mg | ORAL_TABLET | Freq: Every day | ORAL | Status: DC

## 2015-04-02 MED ORDER — AEROCHAMBER PLUS FLO-VU MEDIUM MISC
1.0000 | Freq: Once | Status: AC
  Administered 2015-04-02: 1
  Filled 2015-04-02: qty 1

## 2015-04-02 MED ORDER — METOPROLOL TARTRATE 25 MG PO TABS: 12.5000 mg | ORAL_TABLET | Freq: Two times a day (BID) | ORAL | Status: DC

## 2015-04-02 NOTE — ED Notes (Signed)
Patient transported to CT 

## 2015-04-02 NOTE — ED Notes (Addendum)
Per EMS, pt from home, cold like symptoms x 1 week using OTC meds. Today she went to the bathroom and had to crawl back to the bathroom d/t weakness. Cough, headache are the only symptoms she's been having. Denies N/V/D, fever and chills. Woke up two hours ago with this weakness, stroke screen negative per EMS. Hx of dementia, per husband at her baseline. C/o headache.  Patient only oriented to person, husband states she's usually more oriented than this

## 2015-04-02 NOTE — ED Provider Notes (Signed)
CSN: [id number]     Arrival date & time 04/02/15  1129 History   First MD Initiated Contact with Patient 04/02/15 1140     Chief Complaint  Patient presents with  . Weakness  . Headache  . Cough     (Consider location/radiation/quality/duration/timing/severity/associated sxs/prior Treatment) HPI Victoria Sexton is a 82 y.o. female with history of coronary disease, heart failure, valve disease with mitral valve repair, hypertension, presents to emergency department complaining of generalized weakness. Patient's husband provides most of the history. Patient with URI symptoms for a week. He states she looked well yesterday and ate a good dinner. This morning she was too weak to get out of the bed. She also told her husband that she went to the bathroom and was too weak to walk back so she had to crawl back to the bed. Patient does have memory issues and she does not remember this event, and her husband believes that her memory is slightly worse than it is normally. Patient denies any pain anywhere. She states "I just don't feel good." Patient apparently did complain of a headache earlier. Patient denies any cough, however is coughing during my exam. Patient denies any sore throat or nasal congestion at this time. She denies any urinary symptoms. She has not had any nausea, vomiting, diarrhea. No medications prior to coming in.    Past Medical History  Diagnosis Date  . Hypertension   . MVP (mitral valve prolapse)   . Osteoporosis   . Inguinal hernia right  . Mitral regurgitation due to cusp prolapse   . Cardiac tumor, ventricular     LVOT  . PVC (premature ventricular contraction)   . Hiatal hernia   . Coronary artery disease 10/04/2011  . Congestive heart failure ([place]) 10/04/2011  . Heart murmur   . Shortness of breath   . Anginal pain ([place])   . Hernia, hiatal     Left wears a hernia belt  . Skin cancer 2013    Face.  . S/P mitral valve repair 10/12/2011    Complex valvuloplasty  including triangular resection of flail posterior leaflet with artificial Goretex neocord placement x2 and 26 mm Sorin Memo 3D ring annuloplasty  . S/P CABG x 3 10/12/2011    LIMA to LAD, SVG to OM, SVG to RCA, EVH via left thigh and leg  . Thoracic compression fracture ([place])   . Hx of CABG     -LIMA to LAD,SVG to RCA, SVG to OM, mitral valve repair, patent foramen ovale closure, benign-apearing mass removal.  . Aortic atherosclerosis ([place]) 10/25/2012    Upper aortic atherosclerosis seen on CT scan 08/13/2002  . Mild malnutrition ([place]) 10/25/2012  . Aortic aneurysm ([place])   . Lumbar compression fracture [facility])    Past Surgical History  Procedure Laterality Date  . Abdominal hysterectomy    . Leg fx repair      Right  . Hemorroidectomy    . Tee without cardioversion  11/13/2010    Procedure: TRANSESOPHAGEAL ECHOCARDIOGRAM (TEE);  Surgeon: Candee Furbish, MD;  [facility] ENDOSCOPY;  Service: Cardiovascular;  Laterality: N/A;  . Skin cancer excision  2013    not melanoma  . Hemorrhoid surgery    . Mitral valve repair  10/12/2011    Procedure: MITRAL VALVE REPAIR (MVR);  Surgeon: Rexene Alberts, MD;  Location: [place];  Service: Open Heart Surgery;  Laterality: N/A;  MITRAL VALVE REPAIR  . Coronary artery bypass graft  10/12/2011    Procedure:  CORONARY ARTERY BYPASS GRAFTING (CABG);  Surgeon: Rexene Alberts, MD;  Location: [place];  Service: Open Heart Surgery;  Laterality: N/A;  RESECTION OF LV MASS  . Patent foramen ovale closure  10/12/2011    Procedure: PATENT FORAMEN OVALE CLOSURE;  Surgeon: Rexene Alberts, MD;  Location: [place];  Service: Open Heart Surgery;  Laterality: N/A;  . Left and right heart catheterization with coronary angiogram N/A 09/29/2011    Procedure: LEFT AND RIGHT HEART CATHETERIZATION WITH CORONARY ANGIOGRAM;  Surgeon: Candee Furbish, MD;  Location: [facility] CATH LAB;  Service: Cardiovascular;  Laterality: N/A;  . Hip arthroplasty Right 04/17/2014    Procedure: ARTHROPLASTY BIPOLAR HIP;   Surgeon: Paralee Cancel, MD;  Location: [facility] ORS;  Service: Orthopedics;  Laterality: Right;  . Back surgery    . Total hip arthroplasty Right    Family History  Problem Relation Age of Onset  . Heart attack Mother   . Lung cancer Father    Social History  Substance Use Topics  . Smoking status: Former Smoker -- 0.50 packs/day for 30 years    Types: Cigarettes    Quit date: 11/13/2010  . Smokeless tobacco: Never Used  . Alcohol Use: No   OB History    No data available     Review of Systems  Constitutional: Positive for fatigue. Negative for fever and chills.  Respiratory: Negative for cough, chest tightness and shortness of breath.   Cardiovascular: Negative for chest pain, palpitations and leg swelling.  Gastrointestinal: Negative for nausea, vomiting, abdominal pain and diarrhea.  Genitourinary: Negative for dysuria, flank pain and pelvic pain.  Musculoskeletal: Negative for myalgias, arthralgias, neck pain and neck stiffness.  Skin: Negative for rash.  Neurological: Positive for weakness. Negative for dizziness and headaches.  Psychiatric/Behavioral: Positive for confusion.  All other systems reviewed and are negative.     Allergies  Hydrocodone-acetaminophen; Penicillins; Cyclobenzaprine; and Sulfa antibiotics  Home Medications   Prior to Admission medications   Medication Sig Start Date End Date Taking? Authorizing Provider  aspirin 81 MG tablet Take 81 mg by mouth daily.    Historical Provider, MD  Cholecalciferol (VITAMIN D3) 2000 UNITS TABS Take 2,000 Units by mouth daily.     Historical Provider, MD  Coenzyme Q10 (CO Q-10) 50 MG CAPS Take 50 mg by mouth daily.    Historical Provider, MD  docusate sodium (COLACE) 100 MG capsule Take 1 capsule (100 mg total) by mouth 2 (two) times daily. 12/23/14   Reyne Dumas, MD  ENSURE (ENSURE) Take 1 Can by mouth 3 (three) times daily between meals.    Historical Provider, MD  feeding supplement, ENSURE ENLIVE, (ENSURE  ENLIVE) LIQD Take 237 mLs by mouth 2 (two) times daily between meals. 12/23/14   Reyne Dumas, MD  HYDROcodone-acetaminophen (NORCO/VICODIN) 5-325 MG tablet Take 1 tablet by mouth every 4 (four) hours as needed for moderate pain. 12/23/14   Reyne Dumas, MD  lisinopril (PRINIVIL,ZESTRIL) 5 MG tablet TAKE 1 TABLET ONCE DAILY. 12/26/14   Jerline Pain, MD  methocarbamol (ROBAXIN) 500 MG tablet Take 1 tablet (500 mg total) by mouth every 6 (six) hours as needed for muscle spasms. 12/23/14   Reyne Dumas, MD  metoprolol tartrate (LOPRESSOR) 25 MG tablet Take 0.5 tablets (12.5 mg total) by mouth 2 (two) times daily. 12/24/14   Reyne Dumas, MD  multivitamin-lutein (OCUVITE-LUTEIN) CAPS Take 1 capsule by mouth daily. 6MG     Historical Provider, MD  OVER THE COUNTER MEDICATION Take 1 tablet by  mouth daily. Omega Red    Historical Provider, MD  rivastigmine (EXELON) 9.5 mg/24hr Place 9.5 mg onto the skin daily.    Historical Provider, MD  sorbitol 70 % SOLN Take 20 mLs by mouth daily as needed for moderate constipation. 12/25/14   Reyne Dumas, MD  vitamin E 400 UNIT capsule Take 400 Units by mouth daily.    Historical Provider, MD   BP 149/81 mmHg  Pulse 92  Temp(Src) 97.5 F (36.4 C) (Oral)  Resp 30  Wt 43.092 kg  SpO2 100% Physical Exam  Constitutional: She appears well-developed and well-nourished. No distress.  HENT:  Head: Normocephalic.  Nose: Nose normal.  Oral mucosa dry  Eyes: Conjunctivae are normal. Pupils are equal, round, and reactive to light.  Neck: Normal range of motion. Neck supple.  Cardiovascular: Normal rate, regular rhythm and intact distal pulses.   Murmur heard. Pulmonary/Chest: Effort normal and breath sounds normal. No respiratory distress. She has no wheezes. She has no rales.  Abdominal: Soft. Bowel sounds are normal. She exhibits no distension. There is no tenderness. There is no rebound.  Musculoskeletal: She exhibits no edema.  Neurological: She is alert.   Oriented to self only. 5/5 and equal upper and lower extremity strength bilaterally. Equal grip strength bilaterally.   Skin: Skin is warm and dry.  Psychiatric: She has a normal mood and affect. Her behavior is normal.  Nursing note and vitals reviewed.   ED Course  Procedures (including critical care time) Labs Review Labs Reviewed  COMPREHENSIVE METABOLIC PANEL - Abnormal; Notable for the following:    CO2 21 (*)    Glucose, Bld 119 (*)    Calcium 8.5 (*)    Total Bilirubin 0.2 (*)    All other components within normal limits  URINALYSIS, ROUTINE W REFLEX MICROSCOPIC (NOT AT [facility]) - Abnormal; Notable for the following:    APPearance CLOUDY (*)    Ketones, ur 15 (*)    All other components within normal limits  CBG MONITORING, ED - Abnormal; Notable for the following:    Glucose-Capillary 116 (*)    All other components within normal limits  CULTURE, BLOOD (ROUTINE X 2)  CULTURE, BLOOD (ROUTINE X 2)  URINE CULTURE  CBC WITH DIFFERENTIAL/PLATELET  I-STAT CG4 LACTIC ACID, ED  Randolm Idol, ED    Imaging Review Dg Chest 2 View  04/02/2015  CLINICAL DATA:  Productive cough.  Worsening the past 3-4 days. EXAM: CHEST  2 VIEW COMPARISON:  04/16/2014 FINDINGS: Prior CABG. Large hiatal hernia. Heart is normal size. Tortuosity of the thoracic aorta. There is hyperinflation of the lungs compatible with COPD. No confluent opacities or effusions. No acute bony abnormality. IMPRESSION: COPD.  Large hiatal hernia.  No active disease. Electronically Signed   By: Rolm Baptise M.D.   On: 04/02/2015 12:42   Ct Head Wo Contrast  04/02/2015  CLINICAL DATA:  Weakness, cold like symptoms for 1 week EXAM: CT HEAD WITHOUT CONTRAST TECHNIQUE: Contiguous axial images were obtained from the base of the skull through the vertex without intravenous contrast. COMPARISON:  MR brain 07/18/2014 FINDINGS: There is no evidence of mass effect, midline shift, or extra-axial fluid collections. There is no  evidence of a space-occupying lesion or intracranial hemorrhage. There is no evidence of a cortical-based area of acute infarction. There is generalized cerebral atrophy. There is periventricular white matter low attenuation likely secondary to microangiopathy. The ventricles and sulci are appropriate for the patient's age. The basal cisterns are patent. Visualized  portions of the orbits are unremarkable. There is by 0 left maxillary sinus mucosal thickening. There is bilateral ethmoid sinus mucosal thickening. There is a small sphenoid sinus air-fluid level. Cerebrovascular atherosclerotic calcifications are noted. The osseous structures are unremarkable. IMPRESSION: 1. No acute intracranial pathology. 2. Chronic microvascular disease and cerebral atrophy. Electronically Signed   By: Kathreen Devoid   On: 04/02/2015 15:19   I have personally reviewed and evaluated these images and lab results as part of my medical decision-making.   EKG Interpretation   Date/Time:  Wednesday April 02 2015 11:52:54 EDT Ventricular Rate:  82 PR Interval:  174 QRS Duration: 93 QT Interval:  400 QTC Calculation: 467 R Axis:   45 Text Interpretation:  Sinus rhythm Probable left atrial enlargement  Anterior infarct, old no change from previous. Confirmed by Johnney Killian, MD,  Jeannie Done [phone]) on 04/02/2015 1:46:24 PM      MDM   Final diagnoses:  Weakness  Bronchitis   Patient emergency department with generalized weakness. Apparently has had URI symptoms for a week. However I feel signs are normal at this time. Will check labs, chest x-ray, urinalysis.  3:46 PM Labs all unremarkable. Urine with no signs of infection. Chest x-ray negative. CT head negative. Patient ambulated around the hallway with no difficulty walking. She does not appear to be weak. She has steady gait. She states she feels fine and wants to be discharged home. Her respiratory rate slightly elevated and is in the 20s, and she is coughing, will treat  with an inhaler and Zithromax for bronchitis. We'll discharge her home with close outpatient follow-up. Return precautions discussed explained to her that if she felt worse she'll have to return back to the emergency department.  Filed Vitals:   04/02/15 1401 04/02/15 1409 04/02/15 1430 04/02/15 1500  BP: 164/68  134/69 115/68  Pulse: 83     Temp:  97.9 F (36.6 C)    TempSrc:  Rectal    Resp: 28  27 34  Weight:      SpO2: 99%        Jeannett Senior, PA-C 04/02/15 1551  Charlesetta Shanks, MD 04/02/15 1708

## 2015-04-02 NOTE — ED Notes (Signed)
Bed: WA15 Expected date:  Expected time:  Means of arrival:  Comments: EMS-weakness 

## 2015-04-02 NOTE — Progress Notes (Signed)
I completed the medication history and spoke with the patient's husband about our policy that prevents medications from home being given in the hospital except under limited situations. He is concerned she hasn't taken her AM meds yet. I asked him not to give her them and I would ask the provider to order those medications if appropriate and we would send them from the pharmacy as soon as possible. I relayed this to Jeannett Senior, PA.   Romeo Rabon, PharmD, pager [phone]. 04/02/2015,1:58 PM.

## 2015-04-02 NOTE — ED Notes (Signed)
Patient stated she had just used the bathroom before she left. Alerted PA. Will give fluids and reassess for urine sample after fluids have infused.

## 2015-04-02 NOTE — ED Notes (Signed)
Patient ambulated around department without issue, informed PA

## 2015-04-02 NOTE — ED Notes (Signed)
Patient transported to X-ray 

## 2015-04-02 NOTE — Discharge Instructions (Signed)
Take Zithromax as prescribed until all gone. Use inhaler 2 puffs every 4 hours for shortness of breath and cough. Make sure to drink plenty of fluids. Tylenol for any body aches. Follow with primary care doctor.  Acute Bronchitis Bronchitis is inflammation of the airways that extend from the windpipe into the lungs (bronchi). The inflammation often causes mucus to develop. This leads to a cough, which is the most common symptom of bronchitis.  In acute bronchitis, the condition usually develops suddenly and goes away over time, usually in a couple weeks. Smoking, allergies, and asthma can make bronchitis worse. Repeated episodes of bronchitis may cause further lung problems.  CAUSES Acute bronchitis is most often caused by the same virus that causes a cold. The virus can spread from person to person (contagious) through coughing, sneezing, and touching contaminated objects. SIGNS AND SYMPTOMS   Cough.   Fever.   Coughing up mucus.   Body aches.   Chest congestion.   Chills.   Shortness of breath.   Sore throat.  DIAGNOSIS  Acute bronchitis is usually diagnosed through a physical exam. Your health care provider will also ask you questions about your medical history. Tests, such as chest X-rays, are sometimes done to rule out other conditions.  TREATMENT  Acute bronchitis usually goes away in a couple weeks. Oftentimes, no medical treatment is necessary. Medicines are sometimes given for relief of fever or cough. Antibiotic medicines are usually not needed but may be prescribed in certain situations. In some cases, an inhaler may be recommended to help reduce shortness of breath and control the cough. A cool mist vaporizer may also be used to help thin bronchial secretions and make it easier to clear the chest.  HOME CARE INSTRUCTIONS  Get plenty of rest.   Drink enough fluids to keep your urine clear or pale yellow (unless you have a medical condition that requires fluid  restriction). Increasing fluids may help thin your respiratory secretions (sputum) and reduce chest congestion, and it will prevent dehydration.   Take medicines only as directed by your health care provider.  If you were prescribed an antibiotic medicine, finish it all even if you start to feel better.  Avoid smoking and secondhand smoke. Exposure to cigarette smoke or irritating chemicals will make bronchitis worse. If you are a smoker, consider using nicotine gum or skin patches to help control withdrawal symptoms. Quitting smoking will help your lungs heal faster.   Reduce the chances of another bout of acute bronchitis by washing your hands frequently, avoiding people with cold symptoms, and trying not to touch your hands to your mouth, nose, or eyes.   Keep all follow-up visits as directed by your health care provider.  SEEK MEDICAL CARE IF: Your symptoms do not improve after 1 week of treatment.  SEEK IMMEDIATE MEDICAL CARE IF:  You develop an increased fever or chills.   You have chest pain.   You have severe shortness of breath.  You have bloody sputum.   You develop dehydration.  You faint or repeatedly feel like you are going to pass out.  You develop repeated vomiting.  You develop a severe headache. MAKE SURE YOU:   Understand these instructions.  Will watch your condition.  Will get help right away if you are not doing well or get worse.   This information is not intended to replace advice given to you by your health care provider. Make sure you discuss any questions you have with your health care  provider.   Document Released: 01/29/2004 Document Revised: 01/11/2014 Document Reviewed: 06/13/2012 Elsevier Interactive Patient Education 2016 [employer].   Fatigue Fatigue is feeling tired all of the time, a lack of energy, or a lack of motivation. Occasional or mild fatigue is often a normal response to activity or life in general. However,  long-lasting (chronic) or extreme fatigue may indicate an underlying medical condition. HOME CARE INSTRUCTIONS  Watch your fatigue for any changes. The following actions may help to lessen any discomfort you are feeling:  Talk to your health care provider about how much sleep you need each night. Try to get the required amount every night.  Take medicines only as directed by your health care provider.  Eat a healthy and nutritious diet. Ask your health care provider if you need help changing your diet.  Drink enough fluid to keep your urine clear or pale yellow.  Practice ways of relaxing, such as yoga, meditation, massage therapy, or acupuncture.  Exercise regularly.   Change situations that cause you stress. Try to keep your work and personal routine reasonable.  Do not abuse illegal drugs.  Limit alcohol intake to no more than 1 drink per day for nonpregnant women and 2 drinks per day for men. One drink equals 12 ounces of beer, 5 ounces of wine, or 1 ounces of hard liquor.  Take a multivitamin, if directed by your health care provider. SEEK MEDICAL CARE IF:   Your fatigue does not get better.  You have a fever.   You have unintentional weight loss or gain.  You have headaches.   You have difficulty:   Falling asleep.  Sleeping throughout the night.  You feel angry, guilty, anxious, or sad.   You are unable to have a bowel movement (constipation).   You skin is dry.   Your legs or another part of your body is swollen.  SEEK IMMEDIATE MEDICAL CARE IF:   You feel confused.   Your vision is blurry.  You feel faint or pass out.   You have a severe headache.   You have severe abdominal, pelvic, or back pain.   You have chest pain, shortness of breath, or an irregular or fast heartbeat.   You are unable to urinate or you urinate less than normal.   You develop abnormal bleeding, such as bleeding from the rectum, vagina, nose, lungs, or  nipples.  You vomit blood.   You have thoughts about harming yourself or committing suicide.   You are worried that you might harm someone else.    This information is not intended to replace advice given to you by your health care provider. Make sure you discuss any questions you have with your health care provider.   Document Released: 10/18/2006 Document Revised: 01/11/2014 Document Reviewed: 04/24/2013 Elsevier Interactive Patient Education [employer].

## 2015-04-03 LAB — URINE CULTURE

## 2015-04-07 LAB — CULTURE, BLOOD (ROUTINE X 2)
CULTURE: NO GROWTH
Culture: NO GROWTH

## 2015-04-11 ENCOUNTER — Other Ambulatory Visit: Payer: Self-pay | Admitting: Cardiology

## 2015-07-01 ENCOUNTER — Other Ambulatory Visit: Payer: Self-pay | Admitting: Cardiology

## 2015-07-01 ENCOUNTER — Other Ambulatory Visit: Payer: Self-pay | Admitting: *Deleted

## 2015-07-01 MED ORDER — SIMVASTATIN 5 MG PO TABS
5.0000 mg | ORAL_TABLET | Freq: Every day | ORAL | Status: DC
Start: 2015-07-01 — End: 2015-08-21

## 2015-08-21 ENCOUNTER — Other Ambulatory Visit: Payer: Self-pay | Admitting: Cardiology

## 2015-09-17 ENCOUNTER — Encounter: Payer: Self-pay | Admitting: Hematology

## 2015-09-17 ENCOUNTER — Telehealth: Payer: Self-pay | Admitting: Hematology

## 2015-09-17 NOTE — Telephone Encounter (Signed)
Pt confirmed appt, completed intake, mailed pt letter, faxed referring provider appt date/time °

## 2015-09-30 ENCOUNTER — Encounter: Payer: Self-pay | Admitting: Hematology

## 2015-09-30 ENCOUNTER — Other Ambulatory Visit (HOSPITAL_COMMUNITY)
Admission: RE | Admit: 2015-09-30 | Discharge: 2015-09-30 | Disposition: A | Discharge status: Home / Self Care | Payer: Medicare Other | Source: Ambulatory Visit | Attending: Hematology | Admitting: Hematology

## 2015-09-30 ENCOUNTER — Ambulatory Visit (HOSPITAL_BASED_OUTPATIENT_CLINIC_OR_DEPARTMENT_OTHER): Payer: Medicare Other

## 2015-09-30 ENCOUNTER — Ambulatory Visit (HOSPITAL_BASED_OUTPATIENT_CLINIC_OR_DEPARTMENT_OTHER): Payer: Medicare Other | Admitting: Hematology

## 2015-09-30 VITALS — BP 160/62 | HR 78 | Temp 98.1°F | Resp 18 | Ht 65.0 in | Wt 99.9 lb

## 2015-09-30 DIAGNOSIS — D72820 Lymphocytosis (symptomatic): Secondary | ICD-10-CM

## 2015-09-30 DIAGNOSIS — D479 Neoplasm of uncertain behavior of lymphoid, hematopoietic and related tissue, unspecified: Secondary | ICD-10-CM | POA: Diagnosis not present

## 2015-09-30 LAB — CBC & DIFF AND RETIC
BASO%: 0.1 % (ref 0.0–2.0)
Basophils Absolute: 0 10*3/uL (ref 0.0–0.1)
EOS ABS: 0 10*3/uL (ref 0.0–0.5)
EOS%: 0.3 % (ref 0.0–7.0)
HCT: 42.6 % (ref 34.8–46.6)
HGB: 14.3 g/dL (ref 11.6–15.9)
IMMATURE RETIC FRACT: 3.5 % (ref 1.60–10.00)
LYMPH%: 66 % — AB (ref 14.0–49.7)
MCH: 29.9 pg (ref 25.1–34.0)
MCHC: 33.6 g/dL (ref 31.5–36.0)
MCV: 89.1 fL (ref 79.5–101.0)
MONO#: 0.5 10*3/uL (ref 0.1–0.9)
MONO%: 4.5 % (ref 0.0–14.0)
NEUT%: 29.1 % — ABNORMAL LOW (ref 38.4–76.8)
NEUTROS ABS: 3.2 10*3/uL (ref 1.5–6.5)
PLATELETS: 168 10*3/uL (ref 145–400)
RBC: 4.78 10*6/uL (ref 3.70–5.45)
RDW: 14.6 % — ABNORMAL HIGH (ref 11.2–14.5)
RETIC CT ABS: 57.84 10*3/uL (ref 33.70–90.70)
Retic %: 1.21 % (ref 0.70–2.10)
WBC: 11 10*3/uL — AB (ref 3.9–10.3)
lymph#: 7.2 10*3/uL — ABNORMAL HIGH (ref 0.9–3.3)
nRBC: 0 % (ref 0–0)

## 2015-09-30 LAB — LACTATE DEHYDROGENASE: LDH: 218 U/L (ref 125–245)

## 2015-09-30 LAB — COMPREHENSIVE METABOLIC PANEL
ALT: 16 U/L (ref 0–55)
ANION GAP: 10 meq/L (ref 3–11)
AST: 33 U/L (ref 5–34)
Albumin: 3.9 g/dL (ref 3.5–5.0)
Alkaline Phosphatase: 76 U/L (ref 40–150)
BILIRUBIN TOTAL: 0.37 mg/dL (ref 0.20–1.20)
BUN: 20.8 mg/dL (ref 7.0–26.0)
CO2: 25 meq/L (ref 22–29)
CREATININE: 0.9 mg/dL (ref 0.6–1.1)
Calcium: 10 mg/dL (ref 8.4–10.4)
Chloride: 106 mEq/L (ref 98–109)
EGFR: 59 mL/min/{1.73_m2} — ABNORMAL LOW (ref >90)
GLUCOSE: 91 mg/dL (ref 70–140)
Potassium: 4.8 mEq/L (ref 3.5–5.1)
SODIUM: 142 meq/L (ref 136–145)
TOTAL PROTEIN: 7.8 g/dL (ref 6.4–8.3)

## 2015-09-30 NOTE — Progress Notes (Signed)
[name]    HEMATOLOGY/ONCOLOGY CONSULTATION NOTE  Date of Service: 09/30/2015  Patient Care Team: Harlan Stains, MD as PCP - General (Family Medicine)  CHIEF COMPLAINTS/PURPOSE OF CONSULTATION:  Lymphocytosis  HISTORY OF PRESENTING ILLNESS:   Victoria Sexton is a wonderful 82 y.o. female who has been referred to [place] by Dr .Vidal Schwalbe, MD for evaluation and management of chronic lymphocytosis.  She has a history of multiple medical comorbidities including hypertension, dyslipidemia, coronary artery disease status post CABG, osteoporosis, Alzheimer's dementia with possible vascular dementia who based on outside labs by her primary care physician appears to have had persistent lymphocytosis in the range of 5.8k to 7.2k at least since April 2016. Her hemoglobin and platelet counts have remained normal. She has not had any overt clinically evident significant lymphadenopathy. Has not noted any abdominal pain or distention. No fevers chills night sweats or unexpected weight loss. She reports that she feels about the same today that she felt 6-12 months ago. She previously was a cigarette smoker but quit in 2013 after her heart issues emerged.  She has no other acute concerns at this time. Husband is present at this clinic visit. He is a English as a second language teacher. Notes gradually worsening memory function. Fondly recalls his time in the armed forces. He helped fill in the gaps in her history since she isn't limited historian due to her memory limitations.  MEDICAL HISTORY:  Past Medical History:  Diagnosis Date  . Anginal pain ([place])   . Aortic aneurysm ([place])   . Aortic atherosclerosis ([place]) 10/25/2012   Upper aortic atherosclerosis seen on CT scan 08/13/2002  . Cardiac tumor, ventricular    LVOT  . Congestive heart failure ([place]) 10/04/2011  . Coronary artery disease 10/04/2011  . Heart murmur   . Hernia, hiatal    Left wears a hernia belt  . Hiatal hernia   . Hx of CABG    -LIMA to LAD,SVG to RCA, SVG to OM,  mitral valve repair, patent foramen ovale closure, benign-apearing mass removal.  . Hypertension   . Inguinal hernia right  . Lumbar compression fracture ([place])   . Mild malnutrition ([place]) 10/25/2012  . Mitral regurgitation due to cusp prolapse   . MVP (mitral valve prolapse)   . Osteoporosis   . PVC (premature ventricular contraction)   . S/P CABG x 3 10/12/2011   LIMA to LAD, SVG to OM, SVG to RCA, EVH via left thigh and leg  . S/P mitral valve repair 10/12/2011   Complex valvuloplasty including triangular resection of flail posterior leaflet with artificial Goretex neocord placement x2 and 26 mm Sorin Memo 3D ring annuloplasty  . Shortness of breath   . Skin cancer 2013   Face.  . Thoracic compression fracture [facility])     SURGICAL HISTORY: Past Surgical History:  Procedure Laterality Date  . ABDOMINAL HYSTERECTOMY    . BACK SURGERY    . CORONARY ARTERY BYPASS GRAFT  10/12/2011   Procedure: CORONARY ARTERY BYPASS GRAFTING (CABG);  Surgeon: Rexene Alberts, MD;  Location: [place];  Service: Open Heart Surgery;  Laterality: N/A;  RESECTION OF LV MASS  . HEMORRHOID SURGERY    . HEMORROIDECTOMY    . HIP ARTHROPLASTY Right 04/17/2014   Procedure: ARTHROPLASTY BIPOLAR HIP;  Surgeon: Paralee Cancel, MD;  [facility];  Service: Orthopedics;  Laterality: Right;  . LEFT AND RIGHT HEART CATHETERIZATION WITH CORONARY ANGIOGRAM N/A 09/29/2011   Procedure: LEFT AND RIGHT HEART CATHETERIZATION WITH CORONARY ANGIOGRAM;  Surgeon: Candee Furbish,  MD;  Location: [place] CATH LAB;  Service: Cardiovascular;  Laterality: N/A;  . leg fx repair     Right  . MITRAL VALVE REPAIR  10/12/2011   Procedure: MITRAL VALVE REPAIR (MVR);  Surgeon: Rexene Alberts, MD;  Location: [place];  Service: Open Heart Surgery;  Laterality: N/A;  MITRAL VALVE REPAIR  . PATENT FORAMEN OVALE CLOSURE  10/12/2011   Procedure: PATENT FORAMEN OVALE CLOSURE;  Surgeon: Rexene Alberts, MD;  Location: [place];  Service: Open Heart Surgery;   Laterality: N/A;  . SKIN CANCER EXCISION  2013   not melanoma  . TEE WITHOUT CARDIOVERSION  11/13/2010   Procedure: TRANSESOPHAGEAL ECHOCARDIOGRAM (TEE);  Surgeon: Candee Furbish, MD;  Location: [facility] ENDOSCOPY;  Service: Cardiovascular;  Laterality: N/A;  . TOTAL HIP ARTHROPLASTY Right     SOCIAL HISTORY: Social History   Social History  . Marital status: Married    Spouse name: Ed  . Number of children: 0  . Years of education: 12   Occupational History  . retired     Science writer   Social History Main Topics  . Smoking status: Former Smoker    Packs/day: 0.50    Years: 30.00    Types: Cigarettes    Quit date: 11/13/2010  . Smokeless tobacco: Never Used  . Alcohol use No  . Drug use: No  . Sexual activity: Not on file     Comment: did not ask   Other Topics Concern  . Not on file   Social History Narrative   Lives at home with husband   Caffeine use - coffee 4-5 cups decaf daily    FAMILY HISTORY: Family History  Problem Relation Age of Onset  . Heart attack Mother   . Lung cancer Father     ALLERGIES:  is allergic to hydrocodone-acetaminophen; penicillins; and sulfa antibiotics.  MEDICATIONS:  Current Outpatient Prescriptions  Medication Sig Dispense Refill  . aspirin 81 MG tablet Take 81 mg by mouth daily.    . Cholecalciferol (VITAMIN D3) 2000 UNITS TABS Take 2,000 Units by mouth daily.     . Coenzyme Q10 (CO Q-10) 50 MG CAPS Take 100 mg by mouth daily.     . feeding supplement, ENSURE ENLIVE, (ENSURE ENLIVE) LIQD Take 237 mLs by mouth 2 (two) times daily between meals. (Patient taking differently: Take 350 mLs by mouth 2 (two) times daily between meals. ) 237 mL 12  . lisinopril (PRINIVIL,ZESTRIL) 5 MG tablet TAKE 1 TABLET ONCE DAILY. (Patient taking differently: 20 mg by mouth daily) 90 tablet 2  . metoprolol tartrate (LOPRESSOR) 25 MG tablet Take 0.5 tablets (12.5 mg total) by mouth 2 (two) times daily. 30 tablet 0  . multivitamin-lutein (OCUVITE-LUTEIN) CAPS  Take 1 capsule by mouth daily.     [name] OVER THE COUNTER MEDICATION Take 1 tablet by mouth daily. Omega Red    . rivastigmine (EXELON) 9.5 mg/24hr Place 13.3 mg onto the skin at bedtime.     . simvastatin (ZOCOR) 5 MG tablet Take 1 tablet (5 mg total) by mouth daily. Patient is overdue for an appointment. Please call and schedule for further refills 30 tablet 0  . vitamin E 400 UNIT capsule Take 400 Units by mouth daily.     No current facility-administered medications for this visit.     REVIEW OF SYSTEMS:    10 Point review of Systems was done is negative except as noted above.  PHYSICAL EXAMINATION: ECOG PERFORMANCE STATUS: 2 - Symptomatic, <50% confined  to bed  . Vitals:   09/30/15 1346  BP: (!) 160/62  Pulse: 78  Resp: 18  Temp: 98.1 F (36.7 C)   Filed Weights   09/30/15 1346  Weight: 99 lb 14.4 oz (45.3 kg)   .Body mass index is 16.62 kg/m.  GENERAL:alert,Elderly frail appearing Caucasian female, in no acute distress and comfortable SKIN: No acute rashes EYES: normal, conjunctiva are pink and non-injected, sclera anicteric OROPHARYNX:no exudate, no erythema and lips, buccal mucosa, and tongue normal  NECK: supple, no JVD, thyroid normal size, non-tender, without nodularity LYMPH:  no palpable lymphadenopathy in the axillary or inguinal. Minimal subcentimeter cervical lymph nodes. LUNGS: clear to auscultation with normal respiratory effort HEART: regular rate & rhythm,  no murmurs and no lower extremity edema ABDOMEN: abdomen soft, non-tender, normoactive bowel sounds  Musculoskeletal: no cyanosis of digits and no clubbing  PSYCH: alert & oriented x 3 with fluent speech NEURO: no focal motor/sensory deficits  LABORATORY DATA:  I have reviewed the data as listed  . CBC Latest Ref Rng & Units 09/30/2015 04/02/2015 12/24/2014  WBC 3.9 - 10.3 10e3/uL 11.0(H) 5.5 12.7(H)  Hemoglobin 11.6 - 15.9 g/dL 14.3 12.9 12.8  Hematocrit 34.8 - 46.6 % 42.6 38.2 38.9  Platelets  145 - 400 10e3/uL 168 152 208    . CMP Latest Ref Rng & Units 09/30/2015 04/02/2015 12/23/2014  Glucose 70 - 140 mg/dl 91 119(H) 103(H)  BUN 7.0 - 26.0 mg/dL 20.8 13 15   Creatinine 0.6 - 1.1 mg/dL 0.9 0.52 0.68  Sodium 136 - 145 mEq/L 142 137 140  Potassium 3.5 - 5.1 mEq/L 4.8 3.8 3.8  Chloride 101 - 111 mmol/L - 106 101  CO2 22 - 29 mEq/L 25 21(L) 28  Calcium 8.4 - 10.4 mg/dL 10.0 8.5(L) 9.5  Total Protein 6.4 - 8.3 g/dL 7.8 6.9 6.7  Total Bilirubin 0.20 - 1.20 mg/dL 0.37 0.2(L) 0.5  Alkaline Phos 40 - 150 U/L 76 85 97  AST 5 - 34 U/L 33 27 30  ALT 0 - 55 U/L 16 14 14      RADIOGRAPHIC STUDIES: I have personally reviewed the radiological images as listed and agreed with the findings in the report. No results found.  ASSESSMENT & PLAN:   82 year old patient female with multiple medical comorbidities with  1) Persistent lymphocytosis with minimal subcentimeter cervical lymphadenopathy. Overall picture would be consistent with Possible CLL. Patient currently has no constitutional symptoms. No anemia, thrombocytopenia or neutropenia. No issues with frequent infections. No issues with bleeding or bruising. LDH is within normal limits No hepatosplenomegaly on clinical examination Patient was a chronic smoker until 2013 - this can sometimes cause a persistent polyclonal lymphocytosis even after smoking cessation. No clinical history suggesting other chronic inflammatory conditions. Plan - Labs done today reviewed -peripheral blood smear - flow cytometry has been requested-results pending - - even if this were chronic lymphocytic leukemia the patient does not meet any criteria require treatment at this time.  -Also given her multiple medical comorbidities including Alzheimer's/vascular dementia we would have a high threshold to consider subjecting her to significant workup or treatment. -We will call the patient and her husband to discuss the flow cytometry results when  available.  2). Patient Active Problem List   Diagnosis Date Noted  . Protein-calorie malnutrition, severe 12/20/2014  . Lumbar compression fracture ([place])   . Bradycardia 12/19/2014  . Intractable low back pain 12/19/2014  . Pancreatitis 12/19/2014  . Compression fracture 12/19/2014  . Hypokalemia 12/19/2014  .  Lung nodule 12/19/2014  . Aortic aneurysm ([place]) 12/19/2014  . Mild dementia 08/20/2014  . Preop cardiovascular exam 04/17/2014  . H/O mitral valve repair 04/17/2014  . Fracture of femoral neck, right ([place]) 04/16/2014  . Femoral neck fracture ([place]) 04/16/2014  . Hypoxia 12/29/2013  . Acute diastolic CHF (congestive heart failure) ([place]) 12/29/2013  . SOB (shortness of breath)   . HLD (hyperlipidemia)   . Aortic atherosclerosis ([place]) 10/25/2012  . Mild malnutrition ([place]) 10/25/2012  . S/P MVR (mitral valve repair) 01/17/2012  . S/P mitral valve repair 10/12/2011  . S/P CABG x 3 10/12/2011  . Coronary artery disease 10/04/2011  . Congestive heart failure ([place]) 10/04/2011  . Shortness of breath 09/28/2011  . Right inguinal hernia 09/01/2011  . Mitral valve disorders 02/08/2011  . Mitral regurgitation due to cusp prolapse 11/23/2010  . Cardiac tumor, ventricular 11/23/2010  . Mitral regurgitation 11/12/2010  . Essential hypertension, benign 11/12/2010   -Continue follow-up with primary care physician for management of other medical comorbidities.  All of the patients questions were answered with apparent satisfaction. The patient knows to call the clinic with any problems, questions or concerns.  I spent 45 minutes counseling the patient face to face. The total time spent in the appointment was 60 minutes and more than 50% was on counseling and direct patient cares.    Sullivan Lone MD [place] AAHIVMS [facility] Hematology/Oncology Physician [facility]  (Office):       [phone] (Work cell):  [phone] (Fax):           [phone]  09/30/2015 2:25  PM

## 2015-10-03 LAB — FLOW CYTOMETRY

## 2015-10-07 ENCOUNTER — Telehealth: Payer: Self-pay | Admitting: *Deleted

## 2015-10-07 NOTE — Telephone Encounter (Signed)
RN spoke to patient husband. Family member is aware that she will be set up for a PET scan and office visit.

## 2015-10-15 ENCOUNTER — Other Ambulatory Visit: Payer: Self-pay | Admitting: *Deleted

## 2015-10-15 DIAGNOSIS — D72820 Lymphocytosis (symptomatic): Secondary | ICD-10-CM

## 2015-10-23 ENCOUNTER — Telehealth: Payer: Self-pay | Admitting: *Deleted

## 2015-10-23 NOTE — Telephone Encounter (Signed)
Left voicemail for "Victoria Sexton" at central scheduling to call patient with and appointment. MD Irene Limbo will see patient 1 week after scheduling.

## 2015-10-23 NOTE — Telephone Encounter (Signed)
-----   Message from Brunetta Genera, MD sent at 10/19/2015 10:05 PM EDT ----- Regarding: RE: Pet PET/CT has been ordered we will see her back about 1 week post PET/CT  ----- Message ----- From: Ronnette Juniper, RN Sent: 10/07/2015   4:27 PM To: Brunetta Genera, MD Subject: Pet                                            Spoke with husband. They are okay with PET scan being done. Can you place the orders?

## 2015-10-29 ENCOUNTER — Ambulatory Visit (INDEPENDENT_AMBULATORY_CARE_PROVIDER_SITE_OTHER): Payer: Medicare Other | Admitting: Cardiology

## 2015-10-29 ENCOUNTER — Encounter: Payer: Self-pay | Admitting: Cardiology

## 2015-10-29 VITALS — BP 160/90 | HR 90 | Ht 60.0 in | Wt 101.2 lb

## 2015-10-29 DIAGNOSIS — Z9889 Other specified postprocedural states: Secondary | ICD-10-CM

## 2015-10-29 DIAGNOSIS — I2510 Atherosclerotic heart disease of native coronary artery without angina pectoris: Secondary | ICD-10-CM

## 2015-10-29 DIAGNOSIS — I2583 Coronary atherosclerosis due to lipid rich plaque: Secondary | ICD-10-CM | POA: Diagnosis not present

## 2015-10-29 DIAGNOSIS — Z951 Presence of aortocoronary bypass graft: Secondary | ICD-10-CM | POA: Diagnosis not present

## 2015-10-29 MED ORDER — LISINOPRIL 20 MG PO TABS: 20.0000 mg | ORAL_TABLET | Freq: Every day | ORAL | 3 refills | Status: AC

## 2015-10-29 MED ORDER — SIMVASTATIN 5 MG PO TABS: 5.0000 mg | ORAL_TABLET | Freq: Every day | ORAL | 3 refills | Status: DC

## 2015-10-29 MED ORDER — METOPROLOL TARTRATE 25 MG PO TABS: 12.5000 mg | ORAL_TABLET | Freq: Two times a day (BID) | ORAL | 3 refills | Status: AC

## 2015-10-29 NOTE — Progress Notes (Signed)
Cardiology Office Note    Date:  10/29/2015   ID:  Maranda, Reily September 20, 1927, MRN [id number]  PCP:  Vidal Schwalbe, MD  Cardiologist:   Candee Furbish, MD     History of Present Illness:  Victoria Sexton is a 82 y.o. female here for follow-up status post bypass surgery as well as mitral valve repair by Dr. Roxy Manns on 10/12/11.  - LIMA to LAD  - SVG to RCA  - SVG to obtuse marginal,  - with patent foramen ovale closure  - benign-appearing endocardial mass removal   She's had problems in the past with orthostatic hypotension especially after being diuresed. She's been experiencing some memory loss, falls.  She is maintaining her weight however I encouraged her to try to eat more protein, increased. She's had some problems with memory. She is here with her husband who is a retired Designer, multimedia from [employer].   Past Medical History:  Diagnosis Date  . Anginal pain ([place])   . Aortic aneurysm ([place])   . Aortic atherosclerosis ([place]) 10/25/2012   Upper aortic atherosclerosis seen on CT scan 08/13/2002  . Cardiac tumor, ventricular    LVOT  . Congestive heart failure ([place]) 10/04/2011  . Coronary artery disease 10/04/2011  . Heart murmur   . Hernia, hiatal    Left wears a hernia belt  . Hiatal hernia   . Hx of CABG    -LIMA to LAD,SVG to RCA, SVG to OM, mitral valve repair, patent foramen ovale closure, benign-apearing mass removal.  . Hypertension   . Inguinal hernia right  . Lumbar compression fracture ([place])   . Mild malnutrition ([place]) 10/25/2012  . Mitral regurgitation due to cusp prolapse   . MVP (mitral valve prolapse)   . Osteoporosis   . PVC (premature ventricular contraction)   . S/P CABG x 3 10/12/2011   LIMA to LAD, SVG to OM, SVG to RCA, EVH via left thigh and leg  . S/P mitral valve repair 10/12/2011   Complex valvuloplasty including triangular resection of flail posterior leaflet with artificial Goretex neocord placement x2 and 26 mm Sorin Memo 3D ring annuloplasty  . Shortness  of breath   . Skin cancer 2013   Face.  . Thoracic compression fracture [facility])     Past Surgical History:  Procedure Laterality Date  . ABDOMINAL HYSTERECTOMY    . BACK SURGERY    . CORONARY ARTERY BYPASS GRAFT  10/12/2011   Procedure: CORONARY ARTERY BYPASS GRAFTING (CABG);  Surgeon: Rexene Alberts, MD;  Location: [place];  Service: Open Heart Surgery;  Laterality: N/A;  RESECTION OF LV MASS  . HEMORRHOID SURGERY    . HEMORROIDECTOMY    . HIP ARTHROPLASTY Right 04/17/2014   Procedure: ARTHROPLASTY BIPOLAR HIP;  Surgeon: Paralee Cancel, MD;  [facility];  Service: Orthopedics;  Laterality: Right;  . LEFT AND RIGHT HEART CATHETERIZATION WITH CORONARY ANGIOGRAM N/A 09/29/2011   Procedure: LEFT AND RIGHT HEART CATHETERIZATION WITH CORONARY ANGIOGRAM;  Surgeon: Candee Furbish, MD;  Location: [facility] CATH LAB;  Service: Cardiovascular;  Laterality: N/A;  . leg fx repair     Right  . MITRAL VALVE REPAIR  10/12/2011   Procedure: MITRAL VALVE REPAIR (MVR);  Surgeon: Rexene Alberts, MD;  Location: [place];  Service: Open Heart Surgery;  Laterality: N/A;  MITRAL VALVE REPAIR  . PATENT FORAMEN OVALE CLOSURE  10/12/2011   Procedure: PATENT FORAMEN OVALE CLOSURE;  Surgeon: Rexene Alberts, MD;  Location: [place];  Service: Open Heart Surgery;  Laterality: N/A;  . SKIN CANCER EXCISION  2013   not melanoma  . TEE WITHOUT CARDIOVERSION  11/13/2010   Procedure: TRANSESOPHAGEAL ECHOCARDIOGRAM (TEE);  Surgeon: Candee Furbish, MD;  Location: [facility] ENDOSCOPY;  Service: Cardiovascular;  Laterality: N/A;  . TOTAL HIP ARTHROPLASTY Right     Current Medications: Outpatient Medications Prior to Visit  Medication Sig Dispense Refill  . aspirin 81 MG tablet Take 81 mg by mouth daily.    . Cholecalciferol (VITAMIN D3) 2000 UNITS TABS Take 2,000 Units by mouth daily.     . Coenzyme Q10 (CO Q-10) 100 MG CAPS Take 100 mg by mouth daily.     . multivitamin-lutein (OCUVITE-LUTEIN) CAPS Take 1 capsule by mouth daily.     [name] OVER THE  COUNTER MEDICATION Take 1 tablet by mouth daily. Omega Red    . Rivastigmine 13.3 MG/24HR PT24 Place 13.3 mg onto the skin at bedtime.     . vitamin E 400 UNIT capsule Take 400 Units by mouth daily.    . metoprolol tartrate (LOPRESSOR) 25 MG tablet Take 0.5 tablets (12.5 mg total) by mouth 2 (two) times daily. 30 tablet 0  . simvastatin (ZOCOR) 5 MG tablet Take 1 tablet (5 mg total) by mouth daily. Patient is overdue for an appointment. Please call and schedule for further refills 30 tablet 0  . feeding supplement, ENSURE ENLIVE, (ENSURE ENLIVE) LIQD Take 237 mLs by mouth 2 (two) times daily between meals. (Patient taking differently: Take 350 mLs by mouth 2 (two) times daily between meals. ) 237 mL 12  . lisinopril (PRINIVIL,ZESTRIL) 5 MG tablet TAKE 1 TABLET ONCE DAILY. (Patient taking differently: 20 mg by mouth daily) 90 tablet 2   No facility-administered medications prior to visit.      Allergies:   Hydrocodone-acetaminophen; Penicillins; and Sulfa antibiotics   Social History   Social History  . Marital status: Married    Spouse name: Ed  . Number of children: 0  . Years of education: 12   Occupational History  . retired     Science writer   Social History Main Topics  . Smoking status: Former Smoker    Packs/day: 0.50    Years: 30.00    Types: Cigarettes    Quit date: 11/13/2010  . Smokeless tobacco: Never Used  . Alcohol use No  . Drug use: No  . Sexual activity: Not Asked     Comment: did not ask   Other Topics Concern  . None   Social History Narrative   Lives at home with husband   Caffeine use - coffee 4-5 cups decaf daily     Family History:  The patient's family history includes Heart attack in her mother; Lung cancer in her father.   ROS:   Please see the history of present illness.    ROS All other systems reviewed and are negative.   PHYSICAL EXAM:   VS:  BP (!) 160/90   Pulse 90   Ht 5' (1.524 m)   Wt 101 lb 3.2 oz (45.9 kg)   BMI 19.76 kg/m      GEN: Thin in no acute distress  HEENT: normal  Neck: no JVD, carotid bruits, or masses Cardiac: RRR; 1/6 systolic murmur apex, no rubs, or gallops,no edema  Respiratory:  clear to auscultation bilaterally, normal work of breathing GI: soft, nontender, nondistended, + BS MS: no deformity or atrophy  Skin: warm and dry, no rash Neuro:  Alert and Oriented  x 3, Strength and sensation are intact, memory poor Psych: euthymic mood, full affect  Wt Readings from Last 3 Encounters:  10/29/15 101 lb 3.2 oz (45.9 kg)  09/30/15 99 lb 14.4 oz (45.3 kg)  04/02/15 95 lb (43.1 kg)      Studies/Labs Reviewed:   EKG:  EKG is not ordered today.   Recent Labs: 12/19/2014: Magnesium 2.1; TSH 1.014 09/30/2015: ALT 16; BUN 20.8; Creatinine 0.9; HGB 14.3; Platelets 168; Potassium 4.8; Sodium 142   Lipid Panel    Component Value Date/Time   CHOL 172 11/16/2013 0953   TRIG 83.0 11/16/2013 0953   HDL 53.00 11/16/2013 0953   CHOLHDL 3 11/16/2013 0953   VLDL 16.6 11/16/2013 0953   LDLCALC 102 (H) 11/16/2013 0953    Additional studies/ records that were reviewed today include:  Prior office notes, echocardiogram, EKGs reviewed, lab work reviewed  ECHO: 10/18/11  - Left ventricle: The cavity size was normal. There was severe concentric hypertrophy. The estimated ejection fraction was 55%. Wall motion was normal; there were no regional wall motion abnormalities. There was an increased relative contribution of atrial contraction to ventricular filling. Doppler parameters are consistent with abnormal left ventricular relaxation (grade 1 diastolic dysfunction). - Aortic valve: Moderate thickening and calcification, consistent with sclerosis. - Mitral valve: By mean gradient there appears to be moderate mitral stenosis but MVA by pressure half-time appears normal. Prior procedures included surgical repair. An annular ring prosthesis was present. Moderate thickening, with  moderate involvement of chords. - Left atrium: The atrium was mild to moderately dilated.  ASSESSMENT:    1. Coronary artery disease due to lipid rich plaque   2. S/P CABG x 3   3. S/P mitral valve repair      PLAN:  In order of problems listed above:  Coronary artery disease  - CABG 3 in 2013  - Overall no anginal symptoms, stable  Mitral valve repair  - No change in symptoms. 1/6 systolic murmur appreciated apex  Protein calorie malnutrition  - Encourage increased protein.  Memory loss  - Wearing patch at night as above.  Medication Adjustments/Labs and Tests Ordered: Current medicines are reviewed at length with the patient today.  Concerns regarding medicines are outlined above.  Medication changes, Labs and Tests ordered today are listed in the Patient Instructions below. Patient Instructions  Your physician recommends that you continue on your current medications as directed. Please refer to the Current Medication list given to you today. Your physician wants you to follow-up in: 6 months with Dr. Marlou Porch.  You will receive a reminder letter in the mail two months in advance. If you don't receive a letter, please call our office to schedule the follow-up appointment.     Signed, Candee Furbish, MD  10/29/2015 4:45 PM    [facility] [place], [place], [place]  [address] Phone: [phone]; Fax: [phone]

## 2015-10-29 NOTE — Patient Instructions (Signed)
Your physician recommends that you continue on your current medications as directed. Please refer to the Current Medication list given to you today.  Your physician wants you to follow-up in: 6 months with Dr. Skains. You will receive a reminder letter in the mail two months in advance. If you don't receive a letter, please call our office to schedule the follow-up appointment.  

## 2015-11-03 ENCOUNTER — Other Ambulatory Visit: Payer: Self-pay | Admitting: Hematology

## 2015-11-03 ENCOUNTER — Encounter (HOSPITAL_COMMUNITY)
Admission: RE | Admit: 2015-11-03 | Discharge: 2015-11-03 | Disposition: A | Discharge status: Home / Self Care | Payer: Medicare Other | Source: Ambulatory Visit | Attending: Hematology | Admitting: Hematology

## 2015-11-03 DIAGNOSIS — D72820 Lymphocytosis (symptomatic): Secondary | ICD-10-CM | POA: Insufficient documentation

## 2015-11-03 LAB — GLUCOSE, CAPILLARY: GLUCOSE-CAPILLARY: 115 mg/dL — AB (ref 65–99)

## 2015-11-03 MED ORDER — FLUDEOXYGLUCOSE F - 18 (FDG) INJECTION
4.9600 | Freq: Once | INTRAVENOUS | Status: AC | PRN
  Administered 2015-11-03: 4.96 via INTRAVENOUS

## 2015-11-10 ENCOUNTER — Encounter: Payer: Self-pay | Admitting: Hematology

## 2015-11-10 ENCOUNTER — Ambulatory Visit (HOSPITAL_BASED_OUTPATIENT_CLINIC_OR_DEPARTMENT_OTHER): Payer: Medicare Other | Admitting: Hematology

## 2015-11-10 ENCOUNTER — Telehealth: Payer: Self-pay | Admitting: Hematology

## 2015-11-10 VITALS — BP 158/64 | HR 75 | Temp 97.8°F | Resp 17 | Ht 60.0 in | Wt 100.4 lb

## 2015-11-10 DIAGNOSIS — D72820 Lymphocytosis (symptomatic): Secondary | ICD-10-CM

## 2015-11-10 DIAGNOSIS — Z8579 Personal history of other malignant neoplasms of lymphoid, hematopoietic and related tissues: Secondary | ICD-10-CM

## 2015-11-10 DIAGNOSIS — C9510 Chronic leukemia of unspecified cell type not having achieved remission: Secondary | ICD-10-CM

## 2015-11-10 NOTE — Telephone Encounter (Signed)
Appointments scheduled per 11/10/15 los. AVS report and appointment schedule given to patient per 11/10/15 los. °

## 2015-11-17 NOTE — Progress Notes (Signed)
[name]    HEMATOLOGY/ONCOLOGY CLINIC NOTE  Date of Service: .11/10/2015  Patient Care Team: Harlan Stains, MD as PCP - General (Family Medicine)  CHIEF COMPLAINTS/PURPOSE OF CONSULTATION:  Lymphocytosis  HISTORY OF PRESENTING ILLNESS:   Victoria Sexton is a wonderful 82 y.o. female who has been referred to [place] by Dr .Vidal Schwalbe, MD for evaluation and management of chronic lymphocytosis.  She has a history of multiple medical comorbidities including hypertension, dyslipidemia, coronary artery disease status post CABG, osteoporosis, Alzheimer's dementia with possible vascular dementia who based on outside labs by her primary care physician appears to have had persistent lymphocytosis in the range of 5.8k to 7.2k at least since April 2016. Her hemoglobin and platelet counts have remained normal. She has not had any overt clinically evident significant lymphadenopathy. Has not noted any abdominal pain or distention. No fevers chills night sweats or unexpected weight loss. She reports that she feels about the same today that she felt 6-12 months ago. She previously was a cigarette smoker but quit in 2013 after her heart issues emerged.  She has no other acute concerns at this time. Husband is present at this clinic visit. He is a English as a second language teacher. Notes gradually worsening memory function. Fondly recalls his time in the armed forces. He helped fill in the gaps in her history since she isn't limited historian due to her memory limitations.  INTERVAL HISTORY  Patient is here for followup with her husband. She notes no acute new symptoms. We discussed the lab and PET/CT findings in details.  No fevers/chills/night sweats. PET/CT showed no evidence of lymphoma. Patient and her husband understand the results and uncertainty and would not want to pursue at [facility] at this time.   MEDICAL HISTORY:  Past Medical History:  Diagnosis Date  . Anginal pain ([place])   . Aortic aneurysm ([place])   . Aortic  atherosclerosis ([place]) 10/25/2012   Upper aortic atherosclerosis seen on CT scan 08/13/2002  . Cardiac tumor, ventricular    LVOT  . Congestive heart failure ([place]) 10/04/2011  . Coronary artery disease 10/04/2011  . Heart murmur   . Hernia, hiatal    Left wears a hernia belt  . Hiatal hernia   . Hx of CABG    -LIMA to LAD,SVG to RCA, SVG to OM, mitral valve repair, patent foramen ovale closure, benign-apearing mass removal.  . Hypertension   . Inguinal hernia right  . Lumbar compression fracture ([place])   . Mild malnutrition ([place]) 10/25/2012  . Mitral regurgitation due to cusp prolapse   . MVP (mitral valve prolapse)   . Osteoporosis   . PVC (premature ventricular contraction)   . S/P CABG x 3 10/12/2011   LIMA to LAD, SVG to OM, SVG to RCA, EVH via left thigh and leg  . S/P mitral valve repair 10/12/2011   Complex valvuloplasty including triangular resection of flail posterior leaflet with artificial Goretex neocord placement x2 and 26 mm Sorin Memo 3D ring annuloplasty  . Shortness of breath   . Skin cancer 2013   Face.  . Thoracic compression fracture [facility])     SURGICAL HISTORY: Past Surgical History:  Procedure Laterality Date  . ABDOMINAL HYSTERECTOMY    . BACK SURGERY    . CORONARY ARTERY BYPASS GRAFT  10/12/2011   Procedure: CORONARY ARTERY BYPASS GRAFTING (CABG);  Surgeon: Rexene Alberts, MD;  Location: [facility];  Service: Open Heart Surgery;  Laterality: N/A;  RESECTION OF LV MASS  . HEMORRHOID SURGERY    .  HEMORROIDECTOMY    . HIP ARTHROPLASTY Right 04/17/2014   Procedure: ARTHROPLASTY BIPOLAR HIP;  Surgeon: Paralee Cancel, MD;  Location: [facility];  Service: Orthopedics;  Laterality: Right;  . LEFT AND RIGHT HEART CATHETERIZATION WITH CORONARY ANGIOGRAM N/A 09/29/2011   Procedure: LEFT AND RIGHT HEART CATHETERIZATION WITH CORONARY ANGIOGRAM;  Surgeon: Candee Furbish, MD;  Location: [facility] CATH LAB;  Service: Cardiovascular;  Laterality: N/A;  . leg fx repair     Right  . MITRAL VALVE  REPAIR  10/12/2011   Procedure: MITRAL VALVE REPAIR (MVR);  Surgeon: Rexene Alberts, MD;  Location: [facility];  Service: Open Heart Surgery;  Laterality: N/A;  MITRAL VALVE REPAIR  . PATENT FORAMEN OVALE CLOSURE  10/12/2011   Procedure: PATENT FORAMEN OVALE CLOSURE;  Surgeon: Rexene Alberts, MD;  Location: [facility];  Service: Open Heart Surgery;  Laterality: N/A;  . SKIN CANCER EXCISION  2013   not melanoma  . TEE WITHOUT CARDIOVERSION  11/13/2010   Procedure: TRANSESOPHAGEAL ECHOCARDIOGRAM (TEE);  Surgeon: Candee Furbish, MD;  Location: [facility] ENDOSCOPY;  Service: Cardiovascular;  Laterality: N/A;  . TOTAL HIP ARTHROPLASTY Right     SOCIAL HISTORY: Social History   Social History  . Marital status: Married    Spouse name: Ed  . Number of children: 0  . Years of education: 12   Occupational History  . retired     Science writer   Social History Main Topics  . Smoking status: Former Smoker    Packs/day: 0.50    Years: 30.00    Types: Cigarettes    Quit date: 11/13/2010  . Smokeless tobacco: Never Used  . Alcohol use No  . Drug use: No  . Sexual activity: Not on file     Comment: did not ask   Other Topics Concern  . Not on file   Social History Narrative   Lives at home with husband   Caffeine use - coffee 4-5 cups decaf daily    FAMILY HISTORY: Family History  Problem Relation Age of Onset  . Heart attack Mother   . Lung cancer Father     ALLERGIES:  is allergic to hydrocodone-acetaminophen; penicillins; and sulfa antibiotics.  MEDICATIONS:  Current Outpatient Prescriptions  Medication Sig Dispense Refill  . aspirin 81 MG tablet Take 81 mg by mouth daily.    . Cholecalciferol (VITAMIN D3) 2000 UNITS TABS Take 2,000 Units by mouth daily.     . Coenzyme Q10 (CO Q-10) 100 MG CAPS Take 100 mg by mouth daily.     [name] ENSURE PLUS (ENSURE PLUS) LIQD Take 1 Can by mouth 2 (two) times daily between meals.    [name] lisinopril (PRINIVIL,ZESTRIL) 20 MG tablet Take 1 tablet (20 mg total) by mouth  daily. 90 tablet 3  . metoprolol tartrate (LOPRESSOR) 25 MG tablet Take 0.5 tablets (12.5 mg total) by mouth 2 (two) times daily. 90 tablet 3  . multivitamin-lutein (OCUVITE-LUTEIN) CAPS Take 1 capsule by mouth daily.     [name] OVER THE COUNTER MEDICATION Take 1 tablet by mouth daily. Omega Red    . Rivastigmine 13.3 MG/24HR PT24 Place 13.3 mg onto the skin at bedtime.     . simvastatin (ZOCOR) 5 MG tablet Take 1 tablet (5 mg total) by mouth daily. 90 tablet 3  . vitamin E 400 UNIT capsule Take 400 Units by mouth daily.     No current facility-administered medications for this visit.     REVIEW OF SYSTEMS:    10 Point  review of Systems was done is negative except as noted above.  PHYSICAL EXAMINATION: ECOG PERFORMANCE STATUS: 2 - Symptomatic, <50% confined to bed  . Vitals:   11/10/15 1456  BP: (!) 158/64  Pulse: 75  Resp: 17  Temp: 97.8 F (36.6 C)   Filed Weights   11/10/15 1456  Weight: 100 lb 6.4 oz (45.5 kg)   .Body mass index is 19.61 kg/m.  GENERAL:alert,Elderly frail appearing Caucasian female, in no acute distress and comfortable SKIN: No acute rashes EYES: normal, conjunctiva are pink and non-injected, sclera anicteric OROPHARYNX:no exudate, no erythema and lips, buccal mucosa, and tongue normal  NECK: supple, no JVD, thyroid normal size, non-tender, without nodularity LYMPH:  no palpable lymphadenopathy in the axillary or inguinal. Minimal subcentimeter cervical lymph nodes. LUNGS: clear to auscultation with normal respiratory effort HEART: regular rate & rhythm,  no murmurs and no lower extremity edema ABDOMEN: abdomen soft, non-tender, normoactive bowel sounds  Musculoskeletal: no cyanosis of digits and no clubbing  PSYCH: alert & oriented x 3 with fluent speech NEURO: no focal motor/sensory deficits  LABORATORY DATA:  I have reviewed the data as listed  . CBC Latest Ref Rng & Units 09/30/2015 04/02/2015 12/24/2014  WBC 3.9 - 10.3 10e3/uL 11.0(H) 5.5 12.7(H)   Hemoglobin 11.6 - 15.9 g/dL 14.3 12.9 12.8  Hematocrit 34.8 - 46.6 % 42.6 38.2 38.9  Platelets 145 - 400 10e3/uL 168 152 208   . CBC    Component Value Date/Time   WBC 11.0 (H) 09/30/2015 1522   WBC 5.5 04/02/2015 1227   RBC 4.78 09/30/2015 1522   RBC 4.52 04/02/2015 1227   HGB 14.3 09/30/2015 1522   HCT 42.6 09/30/2015 1522   PLT 168 09/30/2015 1522   MCV 89.1 09/30/2015 1522   MCH 29.9 09/30/2015 1522   MCH 28.5 04/02/2015 1227   MCHC 33.6 09/30/2015 1522   MCHC 33.8 04/02/2015 1227   RDW 14.6 (H) 09/30/2015 1522   LYMPHSABS 7.2 (H) 09/30/2015 1522   MONOABS 0.5 09/30/2015 1522   EOSABS 0.0 09/30/2015 1522   BASOSABS 0.0 09/30/2015 1522    . CMP Latest Ref Rng & Units 09/30/2015 04/02/2015 12/23/2014  Glucose 70 - 140 mg/dl 91 119(H) 103(H)  BUN 7.0 - 26.0 mg/dL 20.'8 13 15  ' Creatinine 0.6 - 1.1 mg/dL 0.9 0.52 0.68  Sodium 136 - 145 mEq/L 142 137 140  Potassium 3.5 - 5.1 mEq/L 4.8 3.8 3.8  Chloride 101 - 111 mmol/L - 106 101  CO2 22 - 29 mEq/L 25 21(L) 28  Calcium 8.4 - 10.4 mg/dL 10.0 8.5(L) 9.5  Total Protein 6.4 - 8.3 g/dL 7.8 6.9 6.7  Total Bilirubin 0.20 - 1.20 mg/dL 0.37 0.2(L) 0.5  Alkaline Phos 40 - 150 U/L 76 85 97  AST 5 - 34 U/L 33 27 30  ALT 0 - 55 U/L '16 14 14   ' . Lab Results  Component Value Date   LDH 218 09/30/2015      RADIOGRAPHIC STUDIES: I have personally reviewed the radiological images as listed and agreed with the findings in the report. Nm Pet Image Initial (pi) Skull Base To Thigh  Result Date: 11/03/2015 CLINICAL DATA:  Initial treatment strategy for possible lymphoma. Lymphocytosis. EXAM: NUCLEAR MEDICINE PET SKULL BASE TO THIGH TECHNIQUE: 4.96 mCi F-18 FDG was injected intravenously. Full-ring PET imaging was performed from the skull base to thigh after the radiotracer. CT data was obtained and used for attenuation correction and anatomic localization. FASTING BLOOD GLUCOSE:  Value:  115 mg/dl COMPARISON:  CTA chest abdomen pelvis  dated 12/19/2014 FINDINGS: NECK No hypermetabolic lymph nodes in the neck. CHEST Scattered small pulmonary nodules measuring up to 6 mm, non FDG avid but beneath the size threshold for PET sensitivity unchanged from the prior and likely benign. Cardiomegaly. Three vessel coronary atherosclerosis. Postsurgical changes related to prior CABG. Prosthetic mitral valve. No hypermetabolic thoracic lymphadenopathy. ABDOMEN/PELVIS No abnormal hypermetabolic activity within the liver, pancreas, or spleen. Mild low-density thickening of the left adrenal gland without discrete nodule (series 4/image 98). Associated mild hypermetabolism, max SUV 3.9, favored to reflect adenomatous hypertrophy. Large hiatal hernia/inverted intrathoracic stomach. Atherosclerotic calcifications the abdominal aorta and branch vessels. Fusiform ectasia of the infrarenal abdominal aorta, measuring 2.4 cm (series 4/image 114). Colonic diverticulosis, without evidence of diverticulitis. No hypermetabolic lymph nodes in the abdomen or pelvis. SKELETON No focal hypermetabolic activity to suggest skeletal metastasis. T7 compression fracture is better visualized on prior CT chest. Status post right hip arthroplasty, without evidence of complication. IMPRESSION: No findings suspicious for lymphoma. Additional ancillary findings as above. Electronically Signed   By: Julian Hy M.D.   On: 11/03/2015 11:21    ASSESSMENT & PLAN:   82 year old patient female with multiple medical comorbidities with  1) Low grade B-cell lymphoproliferative disorder. 78% of 7.2k lymphocytes have abnormal phenotype  CD10, CD19, CD20, CD21, CD22, CD23, HLA-Dr, Kappa   PET/CT shows no evidence of lymphoma. Patient currently has no constitutional symptoms. No anemia, thrombocytopenia or neutropenia. No issues with frequent infections. No issues with bleeding or bruising. LDH is within normal limits No hepatosplenomegaly on clinical examination Patient was a  chronic smoker until 2013 - this can sometimes cause a persistent polyclonal lymphocytosis even after smoking cessation. No clinical history suggesting other chronic inflammatory conditions. Plan - Labs and PET/CT reviewed in details with the patient and her husband. -it appears to have a chronic lymphoproliferative process with no clear LNadenopathy/splenomegaly. -Also given her multiple medical comorbidities including Alzheimer's/vascular dementia we would have a high threshold to consider subjecting her to significant workup including Bone marrow biopsy -patient and her husband are comfortable with close followup to monitor the lymphocytosis.  2). Patient Active Problem List   Diagnosis Date Noted  . Protein-calorie malnutrition, severe 12/20/2014  . Lumbar compression fracture ([place])   . Bradycardia 12/19/2014  . Intractable low back pain 12/19/2014  . Pancreatitis 12/19/2014  . Compression fracture 12/19/2014  . Hypokalemia 12/19/2014  . Lung nodule 12/19/2014  . Aortic aneurysm ([place]) 12/19/2014  . Mild dementia 08/20/2014  . Preop cardiovascular exam 04/17/2014  . H/O mitral valve repair 04/17/2014  . Fracture of femoral neck, right ([place]) 04/16/2014  . Femoral neck fracture ([place]) 04/16/2014  . Hypoxia 12/29/2013  . Acute diastolic CHF (congestive heart failure) ([place]) 12/29/2013  . SOB (shortness of breath)   . HLD (hyperlipidemia)   . Aortic atherosclerosis ([place]) 10/25/2012  . Mild malnutrition ([place]) 10/25/2012  . S/P MVR (mitral valve repair) 01/17/2012  . S/P mitral valve repair 10/12/2011  . S/P CABG x 3 10/12/2011  . Coronary artery disease 10/04/2011  . Congestive heart failure ([place]) 10/04/2011  . Shortness of breath 09/28/2011  . Right inguinal hernia 09/01/2011  . Mitral valve disorders(424.0) 02/08/2011  . Mitral regurgitation due to cusp prolapse 11/23/2010  . Cardiac tumor, ventricular 11/23/2010  . Mitral regurgitation 11/12/2010  . Essential hypertension,  benign 11/12/2010   -Continue follow-up with primary care physician for management of other medical comorbidities.  RTC with Dr Irene Limbo in  3 months with labs. Earlier if any new concerns.  All of the patients questions were answered with apparent satisfaction. The patient knows to call the clinic with any problems, questions or concerns.  I spent 20 minutes counseling the patient face to face. The total time spent in the appointment was 25 minutes and more than 50% was on counseling and direct patient cares.    Sullivan Lone MD [place] AAHIVMS [facility] Hematology/Oncology Physician [facility]  (Office):       [phone] (Work cell):  [phone] (Fax):           [phone]

## 2016-01-12 ENCOUNTER — Other Ambulatory Visit: Payer: Self-pay | Admitting: Family Medicine

## 2016-01-12 DIAGNOSIS — R911 Solitary pulmonary nodule: Secondary | ICD-10-CM

## 2016-01-23 ENCOUNTER — Other Ambulatory Visit: Payer: Self-pay | Admitting: Family Medicine

## 2016-01-23 ENCOUNTER — Ambulatory Visit
Admission: RE | Admit: 2016-01-23 | Discharge: 2016-01-23 | Disposition: A | Discharge status: Home / Self Care | Payer: Medicare Other | Source: Ambulatory Visit | Attending: Family Medicine | Admitting: Family Medicine

## 2016-01-23 ENCOUNTER — Other Ambulatory Visit: Payer: Medicare Other

## 2016-01-23 DIAGNOSIS — R911 Solitary pulmonary nodule: Secondary | ICD-10-CM

## 2016-02-05 ENCOUNTER — Telehealth: Payer: Self-pay | Admitting: Hematology

## 2016-02-05 NOTE — Telephone Encounter (Signed)
Patients husband called to cancel her appointment for Monday Mar 10, 2016.  There has been a death in the family and she is unable to make the appointment

## 2016-02-09 ENCOUNTER — Other Ambulatory Visit: Payer: Medicare Other

## 2016-02-09 ENCOUNTER — Ambulatory Visit: Payer: Medicare Other | Admitting: Hematology

## 2016-02-21 IMAGING — CR DG PORTABLE PELVIS
1 series · 1 of 1 positions shown · non-contrast
Comparison: None.

CLINICAL DATA: Status post right hip arthroplasty

EXAM:
PORTABLE PELVIS 1-2 VIEWS

[AP]
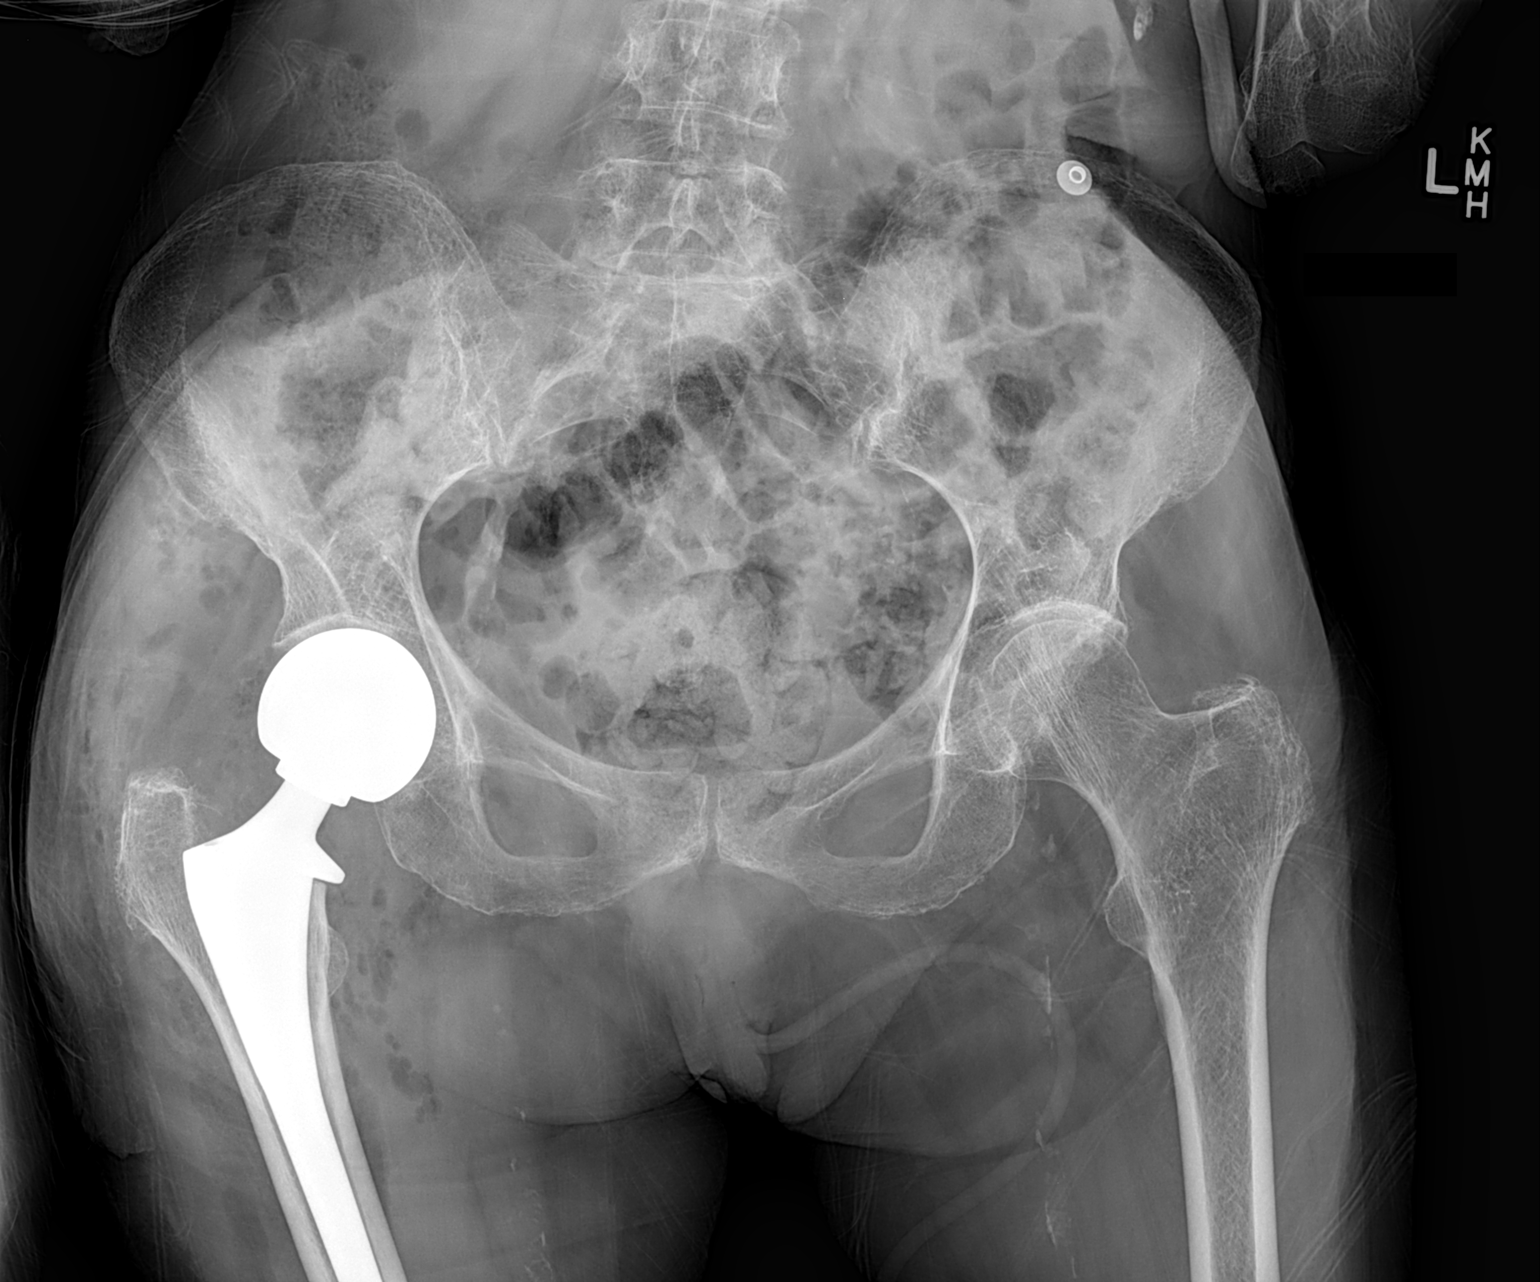

[1 of 1 positions shown; findings below may reference images not displayed]

FINDINGS: Located right hip hemiarthroplasty. The femoral stem is incompletely
imaged; no evidence of periprosthetic fracture.

Profound osteopenia.  The left hip is located and intact.

Expected subcutaneous gas around the right hip.
IMPRESSION: 1. New right hip hemiarthroplasty without adverse finding.
2. Incomplete visualization of the femoral stem. Will gladly obtain
additional views if desired.

## 2016-04-27 ENCOUNTER — Ambulatory Visit (INDEPENDENT_AMBULATORY_CARE_PROVIDER_SITE_OTHER): Payer: Medicare Other | Admitting: Cardiology

## 2016-04-27 ENCOUNTER — Encounter: Payer: Self-pay | Admitting: Cardiology

## 2016-04-27 VITALS — BP 130/78 | HR 80

## 2016-04-27 DIAGNOSIS — Z951 Presence of aortocoronary bypass graft: Secondary | ICD-10-CM

## 2016-04-27 DIAGNOSIS — E441 Mild protein-calorie malnutrition: Secondary | ICD-10-CM | POA: Diagnosis not present

## 2016-04-27 DIAGNOSIS — I2583 Coronary atherosclerosis due to lipid rich plaque: Secondary | ICD-10-CM | POA: Diagnosis not present

## 2016-04-27 DIAGNOSIS — Z9889 Other specified postprocedural states: Secondary | ICD-10-CM

## 2016-04-27 DIAGNOSIS — I2510 Atherosclerotic heart disease of native coronary artery without angina pectoris: Secondary | ICD-10-CM

## 2016-04-27 NOTE — Patient Instructions (Signed)

## 2016-04-27 NOTE — Progress Notes (Signed)
Cardiology Office Note    Date:  04/27/2016   ID:  Shanicka, Oldenkamp 1927-02-05, MRN [id number]  PCP:  Vidal Schwalbe, MD  Cardiologist:   Candee Furbish, MD     History of Present Illness:  JERI JEANBAPTISTE is a 83 y.o. female here for follow-up status post bypass surgery as well as mitral valve repair by Dr. Roxy Manns on 10/12/11.  - LIMA to LAD  - SVG to RCA  - SVG to obtuse marginal,  - with patent foramen ovale closure  - benign-appearing endocardial mass removal   She's had problems in the past with orthostatic hypotension especially after being diuresed. She's been experiencing some memory loss, falls.  She is maintaining her weight however I encouraged her to try to eat more protein, increased. She's had some problems with memory. She is here with her husband who is a retired Designer, multimedia from [employer].  Hip fx 2016. Overall she had one episode of shortness of breath with activity that he noticed. She downplays this. She has not had any since. This was short-lived duration of a few minutes. No chest pain. No syncope. Her husband, Ed, tells me that they have updated the will and made funeral home arrangements.   Past Medical History:  Diagnosis Date  . Anginal pain ([place])   . Aortic aneurysm ([place])   . Aortic atherosclerosis ([place]) 10/25/2012   Upper aortic atherosclerosis seen on CT scan 08/13/2002  . Cardiac tumor, ventricular    LVOT  . Congestive heart failure ([place]) 10/04/2011  . Coronary artery disease 10/04/2011  . Heart murmur   . Hernia, hiatal    Left wears a hernia belt  . Hiatal hernia   . Hx of CABG    -LIMA to LAD,SVG to RCA, SVG to OM, mitral valve repair, patent foramen ovale closure, benign-apearing mass removal.  . Hypertension   . Inguinal hernia right  . Lumbar compression fracture ([place])   . Mild malnutrition ([place]) 10/25/2012  . Mitral regurgitation due to cusp prolapse   . MVP (mitral valve prolapse)   . Osteoporosis   . PVC (premature ventricular contraction)     . S/P CABG x 3 10/12/2011   LIMA to LAD, SVG to OM, SVG to RCA, EVH via left thigh and leg  . S/P mitral valve repair 10/12/2011   Complex valvuloplasty including triangular resection of flail posterior leaflet with artificial Goretex neocord placement x2 and 26 mm Sorin Memo 3D ring annuloplasty  . Shortness of breath   . Skin cancer 2013   Face.  . Thoracic compression fracture [facility])     Past Surgical History:  Procedure Laterality Date  . ABDOMINAL HYSTERECTOMY    . BACK SURGERY    . CORONARY ARTERY BYPASS GRAFT  10/12/2011   Procedure: CORONARY ARTERY BYPASS GRAFTING (CABG);  Surgeon: Rexene Alberts, MD;  Location: [place];  Service: Open Heart Surgery;  Laterality: N/A;  RESECTION OF LV MASS  . HEMORRHOID SURGERY    . HEMORROIDECTOMY    . HIP ARTHROPLASTY Right 04/17/2014   Procedure: ARTHROPLASTY BIPOLAR HIP;  Surgeon: Paralee Cancel, MD;  Location: [facility];  Service: Orthopedics;  Laterality: Right;  . LEFT AND RIGHT HEART CATHETERIZATION WITH CORONARY ANGIOGRAM N/A 09/29/2011   Procedure: LEFT AND RIGHT HEART CATHETERIZATION WITH CORONARY ANGIOGRAM;  Surgeon: Candee Furbish, MD;  Location: [facility] CATH LAB;  Service: Cardiovascular;  Laterality: N/A;  . leg fx repair     Right  . MITRAL VALVE  REPAIR  10/12/2011   Procedure: MITRAL VALVE REPAIR (MVR);  Surgeon: Rexene Alberts, MD;  Location: [place];  Service: Open Heart Surgery;  Laterality: N/A;  MITRAL VALVE REPAIR  . PATENT FORAMEN OVALE CLOSURE  10/12/2011   Procedure: PATENT FORAMEN OVALE CLOSURE;  Surgeon: Rexene Alberts, MD;  Location: [place];  Service: Open Heart Surgery;  Laterality: N/A;  . SKIN CANCER EXCISION  2013   not melanoma  . TEE WITHOUT CARDIOVERSION  11/13/2010   Procedure: TRANSESOPHAGEAL ECHOCARDIOGRAM (TEE);  Surgeon: Candee Furbish, MD;  Location: [facility] ENDOSCOPY;  Service: Cardiovascular;  Laterality: N/A;  . TOTAL HIP ARTHROPLASTY Right     Current Medications: Outpatient Medications Prior to Visit  Medication Sig  Dispense Refill  . aspirin 81 MG tablet Take 81 mg by mouth daily.    . Cholecalciferol (VITAMIN D3) 2000 UNITS TABS Take 2,000 Units by mouth daily.     . Coenzyme Q10 (CO Q-10) 100 MG CAPS Take 100 mg by mouth daily.     [name] ENSURE PLUS (ENSURE PLUS) LIQD Take 1 Can by mouth 2 (two) times daily between meals.    [name] lisinopril (PRINIVIL,ZESTRIL) 20 MG tablet Take 1 tablet (20 mg total) by mouth daily. 90 tablet 3  . metoprolol tartrate (LOPRESSOR) 25 MG tablet Take 0.5 tablets (12.5 mg total) by mouth 2 (two) times daily. 90 tablet 3  . multivitamin-lutein (OCUVITE-LUTEIN) CAPS Take 1 capsule by mouth daily.     [name] OVER THE COUNTER MEDICATION Take 1 tablet by mouth daily. Omega Red    . Rivastigmine 13.3 MG/24HR PT24 Place 13.3 mg onto the skin at bedtime.     . simvastatin (ZOCOR) 5 MG tablet Take 1 tablet (5 mg total) by mouth daily. 90 tablet 3  . vitamin E 400 UNIT capsule Take 400 Units by mouth daily.     No facility-administered medications prior to visit.      Allergies:   Hydrocodone-acetaminophen; Penicillins; and Sulfa antibiotics   Social History   Social History  . Marital status: Married    Spouse name: Ed  . Number of children: 0  . Years of education: 12   Occupational History  . retired     Science writer   Social History Main Topics  . Smoking status: Former Smoker    Packs/day: 0.50    Years: 30.00    Types: Cigarettes    Quit date: 11/13/2010  . Smokeless tobacco: Never Used  . Alcohol use No  . Drug use: No  . Sexual activity: Not Asked     Comment: did not ask   Other Topics Concern  . None   Social History Narrative   Lives at home with husband   Caffeine use - coffee 4-5 cups decaf daily     Family History:  The patient's family history includes Heart attack in her mother; Lung cancer in her father.   ROS:   Please see the history of present illness.    ROS All other systems reviewed and are negative.   PHYSICAL EXAM:   VS:  BP 130/78   Pulse 80     GEN: Thin in no acute distress  HEENT: normal  Neck: no JVD, carotid bruits, or masses Cardiac: RRR; 1/6 systolic murmur apex, no rubs, or gallops,no edema  Respiratory:  clear to auscultation bilaterally, normal work of breathing GI: soft, nontender, nondistended, + BS MS: no deformity or atrophy  Skin: warm and dry, no rash Neuro:  Alert and  Oriented x 3, Strength and sensation are intact, memory poor Psych: euthymic mood, full affect  Wt Readings from Last 3 Encounters:  11/10/15 100 lb 6.4 oz (45.5 kg)  10/29/15 101 lb 3.2 oz (45.9 kg)  09/30/15 99 lb 14.4 oz (45.3 kg)      Studies/Labs Reviewed:   EKG:  Today 04/27/16-sinus rhythm 80 with no other significant abnormalities.  Recent Labs: 09/30/2015: ALT 16; BUN 20.8; Creatinine 0.9; HGB 14.3; Platelets 168; Potassium 4.8; Sodium 142   Lipid Panel    Component Value Date/Time   CHOL 172 11/16/2013 0953   TRIG 83.0 11/16/2013 0953   HDL 53.00 11/16/2013 0953   CHOLHDL 3 11/16/2013 0953   VLDL 16.6 11/16/2013 0953   LDLCALC 102 (H) 11/16/2013 0953    Additional studies/ records that were reviewed today include:  Prior office notes, echocardiogram, EKGs reviewed, lab work reviewed  ECHO: 10/18/11  - Left ventricle: The cavity size was normal. There was severe concentric hypertrophy. The estimated ejection fraction was 55%. Wall motion was normal; there were no regional wall motion abnormalities. There was an increased relative contribution of atrial contraction to ventricular filling. Doppler parameters are consistent with abnormal left ventricular relaxation (grade 1 diastolic dysfunction). - Aortic valve: Moderate thickening and calcification, consistent with sclerosis. - Mitral valve: By mean gradient there appears to be moderate mitral stenosis but MVA by pressure half-time appears normal. Prior procedures included surgical repair. An annular ring prosthesis was present.  Moderate thickening, with moderate involvement of chords. - Left atrium: The atrium was mild to moderately dilated.  ASSESSMENT:    1. Coronary artery disease due to lipid rich plaque   2. S/P CABG x 3   3. Mild malnutrition ([place])   4. S/P mitral valve repair      PLAN:  In order of problems listed above:  Coronary artery disease  - Prior bypass surgery 3 in 2013, no anginal symptoms, doing well.  - Brief symptoms of shortness of breath. We will keep an eye on this. No change in her heart murmur. Dental prophylaxis.  Mitral valve repair  - Soft murmur heard at apex. Dental prophylaxis.  Protein calorie malnutrition   - Encourage continue protein intake  Memory loss  - She has been wearing special patch as above.  Medication Adjustments/Labs and Tests Ordered: Current medicines are reviewed at length with the patient today.  Concerns regarding medicines are outlined above.  Medication changes, Labs and Tests ordered today are listed in the Patient Instructions below. Patient Instructions  Medication Instructions:  The current medical regimen is effective;  continue present plan and medications.  Follow-Up: Follow up in 6 months with Dr. Marlou Porch.  You will receive a letter in the mail 2 months before you are due.  Please call us when you receive this letter to schedule your follow up appointment.  If you need a refill on your cardiac medications before your next appointment, please call your pharmacy.  Thank you for choosing [facility]!!        Signed, Candee Furbish, MD  04/27/2016 3:51 PM    [address], [place], [place]  [address] Phone: [phone]; Fax: [phone]

## 2016-05-21 ENCOUNTER — Emergency Department (HOSPITAL_COMMUNITY)
Admission: EM | Admit: 2016-05-21 | Discharge: 2016-05-21 | Disposition: A | Discharge status: Home / Self Care | Payer: Medicare Other | Attending: Emergency Medicine | Admitting: Emergency Medicine

## 2016-05-21 ENCOUNTER — Encounter (HOSPITAL_COMMUNITY): Payer: Self-pay | Admitting: Emergency Medicine

## 2016-05-21 ENCOUNTER — Emergency Department (HOSPITAL_COMMUNITY): Payer: Medicare Other

## 2016-05-21 DIAGNOSIS — Z87891 Personal history of nicotine dependence: Secondary | ICD-10-CM | POA: Insufficient documentation

## 2016-05-21 DIAGNOSIS — Z7982 Long term (current) use of aspirin: Secondary | ICD-10-CM | POA: Diagnosis not present

## 2016-05-21 DIAGNOSIS — Z79899 Other long term (current) drug therapy: Secondary | ICD-10-CM | POA: Diagnosis not present

## 2016-05-21 DIAGNOSIS — Z85828 Personal history of other malignant neoplasm of skin: Secondary | ICD-10-CM | POA: Diagnosis not present

## 2016-05-21 DIAGNOSIS — I110 Hypertensive heart disease with heart failure: Secondary | ICD-10-CM | POA: Diagnosis not present

## 2016-05-21 DIAGNOSIS — R0789 Other chest pain: Secondary | ICD-10-CM | POA: Diagnosis not present

## 2016-05-21 DIAGNOSIS — I5031 Acute diastolic (congestive) heart failure: Secondary | ICD-10-CM | POA: Diagnosis not present

## 2016-05-21 DIAGNOSIS — I25119 Atherosclerotic heart disease of native coronary artery with unspecified angina pectoris: Secondary | ICD-10-CM

## 2016-05-21 DIAGNOSIS — I10 Essential (primary) hypertension: Secondary | ICD-10-CM | POA: Diagnosis not present

## 2016-05-21 DIAGNOSIS — Z951 Presence of aortocoronary bypass graft: Secondary | ICD-10-CM | POA: Diagnosis not present

## 2016-05-21 DIAGNOSIS — R0602 Shortness of breath: Secondary | ICD-10-CM | POA: Diagnosis present

## 2016-05-21 DIAGNOSIS — I2510 Atherosclerotic heart disease of native coronary artery without angina pectoris: Secondary | ICD-10-CM | POA: Insufficient documentation

## 2016-05-21 DIAGNOSIS — R079 Chest pain, unspecified: Secondary | ICD-10-CM | POA: Insufficient documentation

## 2016-05-21 DIAGNOSIS — Z96641 Presence of right artificial hip joint: Secondary | ICD-10-CM | POA: Insufficient documentation

## 2016-05-21 LAB — BASIC METABOLIC PANEL
ANION GAP: 8 (ref 5–15)
BUN: 18 mg/dL (ref 6–20)
CALCIUM: 9.7 mg/dL (ref 8.9–10.3)
CO2: 25 mmol/L (ref 22–32)
CREATININE: 0.78 mg/dL (ref 0.44–1.00)
Chloride: 104 mmol/L (ref 101–111)
Glucose, Bld: 99 mg/dL (ref 65–99)
Potassium: 4 mmol/L (ref 3.5–5.1)
SODIUM: 137 mmol/L (ref 135–145)

## 2016-05-21 LAB — I-STAT TROPONIN, ED
TROPONIN I, POC: 0.01 ng/mL (ref 0.00–0.08)
Troponin i, poc: 0 ng/mL (ref 0.00–0.08)

## 2016-05-21 LAB — CBC
HCT: 44.6 % (ref 36.0–46.0)
HEMOGLOBIN: 14.5 g/dL (ref 12.0–15.0)
MCH: 29.7 pg (ref 26.0–34.0)
MCHC: 32.5 g/dL (ref 30.0–36.0)
MCV: 91.2 fL (ref 78.0–100.0)
PLATELETS: 151 10*3/uL (ref 150–400)
RBC: 4.89 MIL/uL (ref 3.87–5.11)
RDW: 15.7 % — ABNORMAL HIGH (ref 11.5–15.5)
WBC: 10.6 10*3/uL — AB (ref 4.0–10.5)

## 2016-05-21 MED ORDER — ASPIRIN 81 MG PO CHEW
324.0000 mg | CHEWABLE_TABLET | Freq: Once | ORAL | Status: AC
  Administered 2016-05-21: 324 mg via ORAL
  Filled 2016-05-21: qty 4

## 2016-05-21 NOTE — ED Notes (Signed)
Pt ambulated to the restroom with no assistance.

## 2016-05-21 NOTE — ED Triage Notes (Signed)
Per husband, they were in car running errands when she had a sudden onset of chest pain-history of triple bypass surgery 5 years ago

## 2016-05-21 NOTE — Consult Note (Signed)
Cardiology Consultation:   Patient ID: [name]; [id number]; Dec 14, 1927   Admit date: 05/21/2016 Date of Consult: 05/21/2016  Primary Care Provider: Harlan Stains, MD Primary Cardiologist: Marlou Porch Primary Electrophysiologist:     Patient Profile:   Victoria Sexton is a 83 y.o. female with a hx of CAD, CABG, CHF, dementia   who is being seen today for the evaluation of chest pain  at the request of Dr. Rex Kras .  History of Present Illness:   Victoria Sexton is a 83 yo with hx of CAD , CABG, HTN  Was driving with her husband today and complained of CP . Currently pain free.   Past Medical History:  Diagnosis Date  . Anginal pain ([place])   . Aortic aneurysm ([place])   . Aortic atherosclerosis ([place]) 10/25/2012   Upper aortic atherosclerosis seen on CT scan 08/13/2002  . Cardiac tumor, ventricular    LVOT  . Congestive heart failure ([place]) 10/04/2011  . Coronary artery disease 10/04/2011  . Heart murmur   . Hernia, hiatal    Left wears a hernia belt  . Hiatal hernia   . Hx of CABG    -LIMA to LAD,SVG to RCA, SVG to OM, mitral valve repair, patent foramen ovale closure, benign-apearing mass removal.  . Hypertension   . Inguinal hernia right  . Lumbar compression fracture ([place])   . Mild malnutrition ([place]) 10/25/2012  . Mitral regurgitation due to cusp prolapse   . MVP (mitral valve prolapse)   . Osteoporosis   . PVC (premature ventricular contraction)   . S/P CABG x 3 10/12/2011   LIMA to LAD, SVG to OM, SVG to RCA, EVH via left thigh and leg  . S/P mitral valve repair 10/12/2011   Complex valvuloplasty including triangular resection of flail posterior leaflet with artificial Goretex neocord placement x2 and 26 mm Sorin Memo 3D ring annuloplasty  . Shortness of breath   . Skin cancer 2013   Face.  . Thoracic compression fracture [facility])     Past Surgical History:  Procedure Laterality Date  . ABDOMINAL HYSTERECTOMY    . BACK SURGERY    . CORONARY ARTERY BYPASS GRAFT  10/12/2011   Procedure: CORONARY ARTERY BYPASS GRAFTING (CABG);  Surgeon: Rexene Alberts, MD;  Location: [place];  Service: Open Heart Surgery;  Laterality: N/A;  RESECTION OF LV MASS  . HEMORRHOID SURGERY    . HEMORROIDECTOMY    . HIP ARTHROPLASTY Right 04/17/2014   Procedure: ARTHROPLASTY BIPOLAR HIP;  Surgeon: Paralee Cancel, MD;  [facility];  Service: Orthopedics;  Laterality: Right;  . LEFT AND RIGHT HEART CATHETERIZATION WITH CORONARY ANGIOGRAM N/A 09/29/2011   Procedure: LEFT AND RIGHT HEART CATHETERIZATION WITH CORONARY ANGIOGRAM;  Surgeon: Candee Furbish, MD;  Location: [facility] CATH LAB;  Service: Cardiovascular;  Laterality: N/A;  . leg fx repair     Right  . MITRAL VALVE REPAIR  10/12/2011   Procedure: MITRAL VALVE REPAIR (MVR);  Surgeon: Rexene Alberts, MD;  Location: [place];  Service: Open Heart Surgery;  Laterality: N/A;  MITRAL VALVE REPAIR  . PATENT FORAMEN OVALE CLOSURE  10/12/2011   Procedure: PATENT FORAMEN OVALE CLOSURE;  Surgeon: Rexene Alberts, MD;  Location: [place];  Service: Open Heart Surgery;  Laterality: N/A;  . SKIN CANCER EXCISION  2013   not melanoma  . TEE WITHOUT CARDIOVERSION  11/13/2010   Procedure: TRANSESOPHAGEAL ECHOCARDIOGRAM (TEE);  Surgeon: Candee Furbish, MD;  Location: [place];  Service: Cardiovascular;  Laterality: N/A;  . TOTAL HIP ARTHROPLASTY Right      Inpatient Medications: Scheduled Meds:  Continuous Infusions:  PRN Meds:   Allergies:    Allergies  Allergen Reactions  . Hydrocodone-Acetaminophen Anxiety    Pt nervous and shaky per pt husband  . Penicillins Palpitations    Has patient had a PCN reaction causing immediate rash, facial/tongue/throat swelling, SOB or lightheadedness with hypotension: No Has patient had a PCN reaction causing severe rash involving mucus membranes or skin necrosis: No Has patient had a PCN reaction that required hospitalization No Has patient had a PCN reaction occurring within the last 10 years: No If all of the above  answers are "NO", then may proceed with Cephalosporin use.   . Sulfa Antibiotics Rash    Social History:   Social History   Social History  . Marital status: Married    Spouse name: Ed  . Number of children: 0  . Years of education: 12   Occupational History  . retired     Science writer   Social History Main Topics  . Smoking status: Former Smoker    Packs/day: 0.50    Years: 30.00    Types: Cigarettes    Quit date: 11/13/2010  . Smokeless tobacco: Never Used  . Alcohol use No  . Drug use: No  . Sexual activity: Not on file     Comment: did not ask   Other Topics Concern  . Not on file   Social History Narrative   Lives at home with husband   Caffeine use - coffee 4-5 cups decaf daily    Family History:   The patient's family history includes Heart attack in her mother; Lung cancer in her father.   CAD in her sister.   ROS:  Please see the history of present illness.  All other ROS reviewed and negative.    Appetite is not as good as it used to be Drinks ensure.   Physical Exam/Data:   Vitals:   05/21/16 1010 05/21/16 1021 05/21/16 1058 05/21/16 1144  BP: (!) 219/94 (!) 185/74 (!) 177/69 (!) 187/76  Pulse: 81 69 67 68  Resp: (!) 33 (!) 24 (!) 27 (!) 27  Temp:  98.4 F (36.9 C)    SpO2: 99% 100% 93% 96%   No intake or output data in the 24 hours ending 05/21/16 1401 There were no vitals filed for this visit. There is no height or weight on file to calculate BMI.  General:  Very thin, elderly female, NAD HEENT: normal Lymph: no adenopathy Neck: no JVD Endocrine:  No thryomegaly Vascular: No carotid bruits; FA pulses 2+ bilaterally without bruits  Cardiac:  normal S1, S2; RR, 1-2 / 6 systolic murmur  Lungs:  clear to auscultation bilaterally, no wheezing, rhonchi or rales  Abd: soft, + BS   Ext: no edema Musculoskeletal:  No deformities, BUE and BLE strength normal and equal Skin: warm and dry  Neuro:  CNs 2-12 intact, significant memory issues.  Husband  answered most of the questions  Psych:  Slightly flat affect    EKG:  The EKG was personally reviewed and demonstrates NSR . NS ST changes laterally.   Relevant CV Studies:   Laboratory Data:  Chemistry Recent Labs Lab 05/21/16 1016  NA 137  K 4.0  CL 104  CO2 25  GLUCOSE 99  BUN 18  CREATININE 0.78  CALCIUM 9.7  GFRNONAA >60  GFRAA >60  ANIONGAP 8    No  results for input(s): PROT, ALBUMIN, AST, ALT, ALKPHOS, BILITOT in the last 168 hours. Hematology Recent Labs Lab 05/21/16 1016  WBC 10.6*  RBC 4.89  HGB 14.5  HCT 44.6  MCV 91.2  MCH 29.7  MCHC 32.5  RDW 15.7*  PLT 151   Cardiac EnzymesNo results for input(s): TROPONINI in the last 168 hours.  Recent Labs Lab 05/21/16 1021  TROPIPOC 0.00    BNPNo results for input(s): BNP, PROBNP in the last 168 hours.  DDimer No results for input(s): DDIMER in the last 168 hours.  Radiology/Studies:  Dg Chest 2 View  Result Date: 05/21/2016 CLINICAL DATA:  Sudden onset of mid chest pain. Shortness of breath. EXAM: CHEST  2 VIEW COMPARISON:  Chest CT dated 01/23/2016 and chest x-ray dated 04/02/2015 FINDINGS: Chronic cardiomegaly. Pulmonary vascularity is normal. Large hiatal hernia. CABG.  Mitral valve repair. The lungs are hyperinflated with flattening of the diaphragm consistent with emphysema. Chronic accentuation of the thoracic kyphosis with a stable compression fracture in the mid thoracic spine. Marked tortuosity and calcification in the thoracic aorta. Vertebroplasty of the lower thoracic spine. IMPRESSION: No acute abnormalities. Emphysema. Chronic cardiomegaly. Huge hiatal hernia. Aortic atherosclerosis. Electronically Signed   By: Lorriane Shire M.D.   On: 05/21/2016 10:39    Assessment and Plan:   1. Chest pain:  Difficult to characterize.   She has dementia and cannot even remember having the CP. 1st troponin is negative. Getting a delta troponin.  She and her husband want to go home today More urgently,  she wants something to eat and drink.  She has not had her AM meds yet.   She has significant dementia and I would favor a relatively consertive approach to her care. I outlined several options.  a.   Our standard approach would be to admit her and keep her for obs and send her home tomorrow if work up is negative.    I explained that this our standard plan.    I would not anticipate doing a stress test on her   b. An alternate options would be to get a delta troponin now and DC to home if negative.   She and her husband would like for her to go home today .  Will allow her to eat. Get troponin now.   antcipate DC to home if it is negative.  . 2. HTN:    BP is elevated.   She hasnot had meds yet today  Follow up with Dr. Marlou Porch or primary MD     Signed, Mertie Moores, MD  05/21/2016 2:01 PM

## 2016-05-21 NOTE — ED Provider Notes (Signed)
[facility] DEPT Provider Note   CSN: [id number] Arrival date & time: 05/21/16  1003     History   Chief Complaint Chief Complaint  Patient presents with  . Chest Pain    HPI Victoria Sexton is a 83 y.o. female.  83yo F w/ PMH including CABG, CHF, memory loss who p/w chest pain. Husband reports that they were driving to breakfast this morning when the patient started grabbing her chest. Eventually he asked her why she was doing that and she stated "I think I am having a heart attack." Because she endorses chest pain, he brought her here for evaluation. She is currently chest pain-free. He states that she appeared to have some shortness of breath during the episode but she denies any shortness of breath currently. She cannot recall details of the chest pain episode. She has not had any fever, vomiting, or recent illness. She has not taken her medications this morning but is compliant.  LEVEL 5 CAVEAT DUE TO MEMORY LOSS   The history is provided by the patient and the spouse.    Past Medical History:  Diagnosis Date  . Anginal pain ([place])   . Aortic aneurysm ([place])   . Aortic atherosclerosis ([place]) 10/25/2012   Upper aortic atherosclerosis seen on CT scan 08/13/2002  . Cardiac tumor, ventricular    LVOT  . Congestive heart failure ([place]) 10/04/2011  . Coronary artery disease 10/04/2011  . Heart murmur   . Hernia, hiatal    Left wears a hernia belt  . Hiatal hernia   . Hx of CABG    -LIMA to LAD,SVG to RCA, SVG to OM, mitral valve repair, patent foramen ovale closure, benign-apearing mass removal.  . Hypertension   . Inguinal hernia right  . Lumbar compression fracture ([place])   . Mild malnutrition ([place]) 10/25/2012  . Mitral regurgitation due to cusp prolapse   . MVP (mitral valve prolapse)   . Osteoporosis   . PVC (premature ventricular contraction)   . S/P CABG x 3 10/12/2011   LIMA to LAD, SVG to OM, SVG to RCA, EVH via left thigh and leg  . S/P mitral valve repair 10/12/2011     Complex valvuloplasty including triangular resection of flail posterior leaflet with artificial Goretex neocord placement x2 and 26 mm Sorin Memo 3D ring annuloplasty  . Shortness of breath   . Skin cancer 2013   Face.  . Thoracic compression fracture [facility])     Patient Active Problem List   Diagnosis Date Noted  . Protein-calorie malnutrition, severe 12/20/2014  . Lumbar compression fracture ([place])   . Bradycardia 12/19/2014  . Intractable low back pain 12/19/2014  . Pancreatitis 12/19/2014  . Compression fracture 12/19/2014  . Hypokalemia 12/19/2014  . Lung nodule 12/19/2014  . Aortic aneurysm ([place]) 12/19/2014  . Mild dementia 08/20/2014  . Preop cardiovascular exam 04/17/2014  . H/O mitral valve repair 04/17/2014  . Fracture of femoral neck, right ([place]) 04/16/2014  . Femoral neck fracture ([place]) 04/16/2014  . Hypoxia 12/29/2013  . Acute diastolic CHF (congestive heart failure) ([place]) 12/29/2013  . SOB (shortness of breath)   . HLD (hyperlipidemia)   . Aortic atherosclerosis ([place]) 10/25/2012  . Mild malnutrition ([place]) 10/25/2012  . S/P MVR (mitral valve repair) 01/17/2012  . S/P mitral valve repair 10/12/2011  . S/P CABG x 3 10/12/2011  . Coronary artery disease 10/04/2011  . Congestive heart failure ([place]) 10/04/2011  . Shortness of breath 09/28/2011  . Right inguinal hernia 09/01/2011  .  Mitral valve disorders(424.0) 02/08/2011  . Mitral regurgitation due to cusp prolapse 11/23/2010  . Cardiac tumor, ventricular 11/23/2010  . Mitral regurgitation 11/12/2010  . Essential hypertension, benign 11/12/2010    Past Surgical History:  Procedure Laterality Date  . ABDOMINAL HYSTERECTOMY    . BACK SURGERY    . CORONARY ARTERY BYPASS GRAFT  10/12/2011   Procedure: CORONARY ARTERY BYPASS GRAFTING (CABG);  Surgeon: Rexene Alberts, MD;  Location: [facility];  Service: Open Heart Surgery;  Laterality: N/A;  RESECTION OF LV MASS  . HEMORRHOID SURGERY    . HEMORROIDECTOMY    . HIP  ARTHROPLASTY Right 04/17/2014   Procedure: ARTHROPLASTY BIPOLAR HIP;  Surgeon: Paralee Cancel, MD;  Location: [facility];  Service: Orthopedics;  Laterality: Right;  . LEFT AND RIGHT HEART CATHETERIZATION WITH CORONARY ANGIOGRAM N/A 09/29/2011   Procedure: LEFT AND RIGHT HEART CATHETERIZATION WITH CORONARY ANGIOGRAM;  Surgeon: Candee Furbish, MD;  Location: [facility] CATH LAB;  Service: Cardiovascular;  Laterality: N/A;  . leg fx repair     Right  . MITRAL VALVE REPAIR  10/12/2011   Procedure: MITRAL VALVE REPAIR (MVR);  Surgeon: Rexene Alberts, MD;  Location: [facility];  Service: Open Heart Surgery;  Laterality: N/A;  MITRAL VALVE REPAIR  . PATENT FORAMEN OVALE CLOSURE  10/12/2011   Procedure: PATENT FORAMEN OVALE CLOSURE;  Surgeon: Rexene Alberts, MD;  Location: [facility];  Service: Open Heart Surgery;  Laterality: N/A;  . SKIN CANCER EXCISION  2013   not melanoma  . TEE WITHOUT CARDIOVERSION  11/13/2010   Procedure: TRANSESOPHAGEAL ECHOCARDIOGRAM (TEE);  Surgeon: Candee Furbish, MD;  Location[facility] ENDOSCOPY;  Service: Cardiovascular;  Laterality: N/A;  . TOTAL HIP ARTHROPLASTY Right     OB History    No data available       Home Medications    Prior to Admission medications   Medication Sig Start Date End Date Taking? Authorizing Provider  aspirin 81 MG tablet Take 81 mg by mouth daily.   Yes [provider]  Cholecalciferol (VITAMIN D3) 2000 UNITS TABS Take 2,000 Units by mouth daily.    Yes [provider]  Coenzyme Q10 (CO Q-10) 100 MG CAPS Take 100 mg by mouth daily.    Yes [provider]  ENSURE PLUS (ENSURE PLUS) LIQD Take 1 Can by mouth 2 (two) times daily between meals.   Yes [provider]  lisinopril (PRINIVIL,ZESTRIL) 20 MG tablet Take 1 tablet (20 mg total) by mouth daily. 10/29/15  Yes Jerline Pain, MD  Memantine HCl (NAMENDA PO) Take 1 tablet by mouth daily.   Yes [provider]  metoprolol tartrate (LOPRESSOR) 25 MG tablet Take 0.5 tablets (12.5  mg total) by mouth 2 (two) times daily. 10/29/15  Yes Jerline Pain, MD  multivitamin-lutein (OCUVITE-LUTEIN) CAPS Take 1 capsule by mouth daily.    Yes [provider]  OVER THE COUNTER MEDICATION Take 1 tablet by mouth daily. Omega Red   Yes [provider]  Rivastigmine 13.3 MG/24HR PT24 Place 13.3 mg onto the skin at bedtime.    Yes [provider]  simvastatin (ZOCOR) 5 MG tablet Take 1 tablet (5 mg total) by mouth daily. 10/29/15  Yes Jerline Pain, MD  vitamin E 400 UNIT capsule Take 400 Units by mouth daily.   Yes [provider]    Family History Family History  Problem Relation Age of Onset  . Heart attack Mother   . Lung cancer Father  Social History Social History  Substance Use Topics  . Smoking status: Former Smoker    Packs/day: 0.50    Years: 30.00    Types: Cigarettes    Quit date: 11/13/2010  . Smokeless tobacco: Never Used  . Alcohol use No     Allergies   Hydrocodone-acetaminophen; Penicillins; and Sulfa antibiotics   Review of Systems Review of Systems  Unable to perform ROS: Dementia     Physical Exam Updated Vital Signs BP (!) 162/93 (BP Location: Right Arm)   Pulse 79   Temp 98.4 F (36.9 C)   Resp (!) 30   SpO2 93%   Physical Exam  Constitutional: She appears well-developed. No distress.  Thin, frail elderly woman awake and alert  HENT:  Head: Normocephalic and atraumatic.  Mouth/Throat: Oropharynx is clear and moist.  Moist mucous membranes  Eyes: Conjunctivae are normal. Pupils are equal, round, and reactive to light.  Neck: Neck supple.  Cardiovascular: Normal rate and regular rhythm.   Murmur heard.  Systolic murmur is present with a grade of 2/6  Pulmonary/Chest: Effort normal and breath sounds normal.  Abdominal: Soft. Bowel sounds are normal. She exhibits no distension. There is no tenderness.  Musculoskeletal: She exhibits no edema.  Neurological: She is alert.  Fluent speech, short  term memory loss  Skin: Skin is warm and dry.  Psychiatric: She has a normal mood and affect. Judgment normal.  Nursing note and vitals reviewed.    ED Treatments / Results  Labs (all labs ordered are listed, but only abnormal results are displayed) Labs Reviewed  CBC - Abnormal; Notable for the following:       Result Value   WBC 10.6 (*)    RDW 15.7 (*)    All other components within normal limits  BASIC METABOLIC PANEL  I-STAT TROPOININ, ED  I-STAT TROPOININ, ED    EKG  EKG Interpretation  Date/Time:  Friday May 21 2016 10:07:32 EDT Ventricular Rate:  78 PR Interval:    QRS Duration: 91 QT Interval:  393 QTC Calculation: 448 R Axis:   84 Text Interpretation:  Sinus rhythm Left atrial enlargement Anterior infarct, old No significant change since last tracing Confirmed by Theotis Burrow [phone]) on 05/21/2016 10:34:24 AM       Radiology Dg Chest 2 View  Result Date: 05/21/2016 CLINICAL DATA:  Sudden onset of mid chest pain. Shortness of breath. EXAM: CHEST  2 VIEW COMPARISON:  Chest CT dated 01/23/2016 and chest x-ray dated 04/02/2015 FINDINGS: Chronic cardiomegaly. Pulmonary vascularity is normal. Large hiatal hernia. CABG.  Mitral valve repair. The lungs are hyperinflated with flattening of the diaphragm consistent with emphysema. Chronic accentuation of the thoracic kyphosis with a stable compression fracture in the mid thoracic spine. Marked tortuosity and calcification in the thoracic aorta. Vertebroplasty of the lower thoracic spine. IMPRESSION: No acute abnormalities. Emphysema. Chronic cardiomegaly. Huge hiatal hernia. Aortic atherosclerosis. Electronically Signed   By: Lorriane Shire M.D.   On: 05/21/2016 10:39    Procedures Procedures (including critical care time)  Medications Ordered in ED Medications  aspirin chewable tablet 324 mg (324 mg Oral Given 05/21/16 1110)     Initial Impression / Assessment and Plan / ED Course  I have reviewed the triage vital  signs and the nursing notes.  Pertinent labs & imaging results that were available during my care of the patient were reviewed by me and considered in my medical decision making (see chart for details).    Pt w/ episode  of chest pain at rest this morning, She is unable to recall details of the event but states that she is having no chest pain in the ED. Her vital signs were notable for hypertension but she has not had her morning medications yet. Gave 324mg  ASA. Initial EKG without acute ischemic changes. Lab work shows negative initial troponin, no evidence of infection. Chest x-ray negative for acute process. As of her extensive cardiac history, contacted cardiology. Pt evaluated by Dr. Acie Fredrickson, appreciate assistance. He discussed options with the patient and her husband and they both were not interested in a hospitalization. Dr. Acie Fredrickson felt she was likely poor catheterization candidate therefore he felt comfortable obtaining serial troponins and allowing the patient to be discharged if negative. Repeat EKG was stable and repeat troponin was negative. Patient remains well-appearing on reexamination and denying any further episodes of chest pain. They understand return precautions. Patient discharged in satisfactory condition.  Final Clinical Impressions(s) / ED Diagnoses   Final diagnoses:  Chest pain, unspecified type    New Prescriptions New Prescriptions   No medications on file     Ndidi Nesby, Wenda Over[place], MD 05/21/16 [phone]

## 2016-10-12 ENCOUNTER — Telehealth: Payer: Self-pay

## 2016-10-12 ENCOUNTER — Other Ambulatory Visit: Payer: Self-pay

## 2016-10-12 NOTE — Telephone Encounter (Signed)
In-basket sent to scheduling for pt to be scheduled this week or next for labs and doctor visit with Dr. Irene Limbo. Confirmed lab orders already placed.

## 2016-10-14 ENCOUNTER — Telehealth: Payer: Self-pay | Admitting: Hematology

## 2016-10-14 NOTE — Telephone Encounter (Signed)
Scheduled appt per 10/9 sch message - left message with appt date and time and sent reminder letter in the mail.

## 2016-10-18 ENCOUNTER — Encounter: Payer: Self-pay | Admitting: Hematology

## 2016-10-28 ENCOUNTER — Inpatient Hospital Stay: Payer: Medicare Other | Attending: Hematology | Admitting: Hematology

## 2016-10-28 ENCOUNTER — Encounter: Payer: Self-pay | Admitting: Hematology

## 2016-10-28 ENCOUNTER — Telehealth: Payer: Self-pay | Admitting: Hematology

## 2016-10-28 ENCOUNTER — Other Ambulatory Visit (HOSPITAL_BASED_OUTPATIENT_CLINIC_OR_DEPARTMENT_OTHER): Payer: Medicare Other

## 2016-10-28 VITALS — BP 175/78 | HR 76 | Temp 98.3°F | Resp 17 | Ht 60.0 in | Wt 100.0 lb

## 2016-10-28 DIAGNOSIS — Z8579 Personal history of other malignant neoplasms of lymphoid, hematopoietic and related tissues: Secondary | ICD-10-CM

## 2016-10-28 DIAGNOSIS — D72820 Lymphocytosis (symptomatic): Secondary | ICD-10-CM

## 2016-10-28 DIAGNOSIS — C9510 Chronic leukemia of unspecified cell type not having achieved remission: Secondary | ICD-10-CM

## 2016-10-28 LAB — CBC & DIFF AND RETIC
BASO%: 0.2 % (ref 0.0–2.0)
BASOS ABS: 0 10*3/uL (ref 0.0–0.1)
EOS ABS: 0.1 10*3/uL (ref 0.0–0.5)
EOS%: 0.5 % (ref 0.0–7.0)
HEMATOCRIT: 40.3 % (ref 34.8–46.6)
HEMOGLOBIN: 12.9 g/dL (ref 11.6–15.9)
Immature Retic Fract: 8.9 % (ref 1.60–10.00)
LYMPH#: 6.2 10*3/uL — AB (ref 0.9–3.3)
LYMPH%: 53.9 % — ABNORMAL HIGH (ref 14.0–49.7)
MCH: 29.9 pg (ref 25.1–34.0)
MCHC: 32 g/dL (ref 31.5–36.0)
MCV: 93.3 fL (ref 79.5–101.0)
MONO#: 0.5 10*3/uL (ref 0.1–0.9)
MONO%: 4.1 % (ref 0.0–14.0)
NEUT#: 4.8 10*3/uL (ref 1.5–6.5)
NEUT%: 41.3 % (ref 38.4–76.8)
PLATELETS: 155 10*3/uL (ref 145–400)
RBC: 4.32 10*6/uL (ref 3.70–5.45)
RDW: 14.8 % — AB (ref 11.2–14.5)
RETIC %: 1.61 % (ref 0.70–2.10)
RETIC CT ABS: 69.55 10*3/uL (ref 33.70–90.70)
WBC: 11.6 10*3/uL — ABNORMAL HIGH (ref 3.9–10.3)

## 2016-10-28 LAB — COMPREHENSIVE METABOLIC PANEL
ALT: 10 U/L (ref 0–55)
ANION GAP: 10 meq/L (ref 3–11)
AST: 24 U/L (ref 5–34)
Albumin: 3.7 g/dL (ref 3.5–5.0)
Alkaline Phosphatase: 77 U/L (ref 40–150)
BUN: 19.9 mg/dL (ref 7.0–26.0)
CHLORIDE: 109 meq/L (ref 98–109)
CO2: 26 meq/L (ref 22–29)
Calcium: 9.6 mg/dL (ref 8.4–10.4)
Creatinine: 0.8 mg/dL (ref 0.6–1.1)
Glucose: 97 mg/dl (ref 70–140)
POTASSIUM: 4.2 meq/L (ref 3.5–5.1)
Sodium: 145 mEq/L (ref 136–145)
Total Bilirubin: 0.4 mg/dL (ref 0.20–1.20)
Total Protein: 7.2 g/dL (ref 6.4–8.3)

## 2016-10-28 LAB — LACTATE DEHYDROGENASE: LDH: 217 U/L (ref 125–245)

## 2016-10-28 NOTE — Telephone Encounter (Signed)
Patients husband did not want to schedule this appointment until they talked with family doctor first.

## 2016-10-28 NOTE — Progress Notes (Signed)
[name]    HEMATOLOGY/ONCOLOGY CLINIC NOTE  Date of Service: 10/28/16  Patient Care Team: Harlan Stains, MD as PCP - General (Family Medicine)  CHIEF COMPLAINTS/PURPOSE OF CONSULTATION:  Lymphocytosis  HISTORY OF PRESENTING ILLNESS:   Victoria Sexton is a wonderful 83 y.o. female who has been referred to [place] by Dr .Harlan Stains, MD for evaluation and management of chronic lymphocytosis.  She has a history of multiple medical comorbidities including hypertension, dyslipidemia, coronary artery disease status post CABG, osteoporosis, Alzheimer's dementia with possible vascular dementia who based on outside labs by her primary care physician appears to have had persistent lymphocytosis in the range of 5.8k to 7.2k at least since April 2016. Her hemoglobin and platelet counts have remained normal. She has not had any overt clinically evident significant lymphadenopathy. Has not noted any abdominal pain or distention. No fevers chills night sweats or unexpected weight loss. She reports that she feels about the same today that she felt 6-12 months ago. She previously was a cigarette smoker but quit in 2013 after her heart issues emerged.  She has no other acute concerns at this time. Husband is present at this clinic visit. He is a English as a second language teacher. Notes gradually worsening memory function. Fondly recalls his time in the armed forces. He helped fill in the gaps in her history since she isn't limited historian due to her memory limitations.  INTERVAL HISTORY  Patient is here for followup with her husband. She notes no acute new symptoms or concerns since we last saw her one year ago. She was recently seen by her PCP who noted that she abnormal blood counts. She wanted her to be seen back in our clinic for evaluation. Our workup today included CBC which only showed mild elevation in her leukocytes and lymphocytosis which is relatively stable. No associated anemia or thrombocytopenia. The patient reports that she  feels at her baseline and she has not been limited in her daily activities.  On review of systems, pt denies fever, chills, rash, mouth sores, weight loss, decreased appetite, urinary complaints. Denies pain. Pt denies abdominal pain, nausea, vomiting.   MEDICAL HISTORY:  Past Medical History:  Diagnosis Date  . Anginal pain ([place])   . Aortic aneurysm ([place])   . Aortic atherosclerosis ([place]) 10/25/2012   Upper aortic atherosclerosis seen on CT scan 08/13/2002  . Cardiac tumor, ventricular    LVOT  . Congestive heart failure ([place]) 10/04/2011  . Coronary artery disease 10/04/2011  . Heart murmur   . Hernia, hiatal    Left wears a hernia belt  . Hiatal hernia   . Hx of CABG    -LIMA to LAD,SVG to RCA, SVG to OM, mitral valve repair, patent foramen ovale closure, benign-apearing mass removal.  . Hypertension   . Inguinal hernia right  . Lumbar compression fracture ([place])   . Mild malnutrition ([place]) 10/25/2012  . Mitral regurgitation due to cusp prolapse   . MVP (mitral valve prolapse)   . Osteoporosis   . PVC (premature ventricular contraction)   . S/P CABG x 3 10/12/2011   LIMA to LAD, SVG to OM, SVG to RCA, EVH via left thigh and leg  . S/P mitral valve repair 10/12/2011   Complex valvuloplasty including triangular resection of flail posterior leaflet with artificial Goretex neocord placement x2 and 26 mm Sorin Memo 3D ring annuloplasty  . Shortness of breath   . Skin cancer 2013   Face.  . Thoracic compression fracture [facility])     SURGICAL  HISTORY: Past Surgical History:  Procedure Laterality Date  . ABDOMINAL HYSTERECTOMY    . BACK SURGERY    . CORONARY ARTERY BYPASS GRAFT  10/12/2011   Procedure: CORONARY ARTERY BYPASS GRAFTING (CABG);  Surgeon: Rexene Alberts, MD;  Location: [facility];  Service: Open Heart Surgery;  Laterality: N/A;  RESECTION OF LV MASS  . HEMORRHOID SURGERY    . HEMORROIDECTOMY    . HIP ARTHROPLASTY Right 04/17/2014   Procedure: ARTHROPLASTY BIPOLAR HIP;  Surgeon:  Paralee Cancel, MD;  [facility];  Service: Orthopedics;  Laterality: Right;  . LEFT AND RIGHT HEART CATHETERIZATION WITH CORONARY ANGIOGRAM N/A 09/29/2011   Procedure: LEFT AND RIGHT HEART CATHETERIZATION WITH CORONARY ANGIOGRAM;  Surgeon: Candee Furbish, MD;  Location: [facility] CATH LAB;  Service: Cardiovascular;  Laterality: N/A;  . leg fx repair     Right  . MITRAL VALVE REPAIR  10/12/2011   Procedure: MITRAL VALVE REPAIR (MVR);  Surgeon: Rexene Alberts, MD;  Location: [facility];  Service: Open Heart Surgery;  Laterality: N/A;  MITRAL VALVE REPAIR  . PATENT FORAMEN OVALE CLOSURE  10/12/2011   Procedure: PATENT FORAMEN OVALE CLOSURE;  Surgeon: Rexene Alberts, MD;  Location: [facility];  Service: Open Heart Surgery;  Laterality: N/A;  . SKIN CANCER EXCISION  2013   not melanoma  . TEE WITHOUT CARDIOVERSION  11/13/2010   Procedure: TRANSESOPHAGEAL ECHOCARDIOGRAM (TEE);  Surgeon: Candee Furbish, MD;  Location: [facility] ENDOSCOPY;  Service: Cardiovascular;  Laterality: N/A;  . TOTAL HIP ARTHROPLASTY Right     SOCIAL HISTORY: Social History   Social History  . Marital status: Married    Spouse name: Ed  . Number of children: 0  . Years of education: 12   Occupational History  . retired     Science writer   Social History Main Topics  . Smoking status: Former Smoker    Packs/day: 0.50    Years: 30.00    Types: Cigarettes    Quit date: 11/13/2010  . Smokeless tobacco: Never Used  . Alcohol use No  . Drug use: No  . Sexual activity: Not on file     Comment: did not ask   Other Topics Concern  . Not on file   Social History Narrative   Lives at home with husband   Caffeine use - coffee 4-5 cups decaf daily    FAMILY HISTORY: Family History  Problem Relation Age of Onset  . Heart attack Mother   . Lung cancer Father     ALLERGIES:  is allergic to hydrocodone-acetaminophen; penicillins; and sulfa antibiotics.  MEDICATIONS:  Current Outpatient Prescriptions  Medication Sig Dispense Refill  .  Nutritional Supplements (ENSURE ENLIVE PO) Take by mouth 2 (two) times daily.    [name] aspirin 81 MG tablet Take 81 mg by mouth daily.    . Cholecalciferol (VITAMIN D3) 2000 UNITS TABS Take 2,000 Units by mouth daily.     . Coenzyme Q10 (CO Q-10) 100 MG CAPS Take 100 mg by mouth daily.     [name] lisinopril (PRINIVIL,ZESTRIL) 20 MG tablet Take 1 tablet (20 mg total) by mouth daily. 90 tablet 3  . Memantine HCl (NAMENDA PO) Take 1 tablet by mouth daily.    . metoprolol tartrate (LOPRESSOR) 25 MG tablet Take 0.5 tablets (12.5 mg total) by mouth 2 (two) times daily. 90 tablet 3  . multivitamin-lutein (OCUVITE-LUTEIN) CAPS Take 2 capsules by mouth daily.     [name] OVER THE COUNTER MEDICATION Take 1 tablet by mouth  daily. Omega Red    . Rivastigmine 13.3 MG/24HR PT24 Place 13.3 mg onto the skin at bedtime.     . simvastatin (ZOCOR) 5 MG tablet Take 1 tablet (5 mg total) by mouth daily. 90 tablet 3  . vitamin E 400 UNIT capsule Take 400 Units by mouth daily.     No current facility-administered medications for this visit.     REVIEW OF SYSTEMS:    10 Point review of Systems was done is negative except as noted above.  PHYSICAL EXAMINATION: ECOG PERFORMANCE STATUS: 2 - Symptomatic, <50% confined to bed  . Vitals:   10/28/16 1136  BP: (!) 175/78  Pulse: 76  Resp: 17  Temp: 98.3 F (36.8 C)  SpO2: 97%   Filed Weights   10/28/16 1136  Weight: 100 lb (45.4 kg)   .Body mass index is 19.53 kg/m.  GENERAL:alert,Elderly frail appearing Caucasian female, in no acute distress and comfortable SKIN: No acute rashes EYES: normal, conjunctiva are pink and non-injected, sclera anicteric OROPHARYNX:no exudate, no erythema and lips, buccal mucosa, and tongue normal  NECK: supple, no JVD, thyroid normal size, non-tender, without nodularity LYMPH:  no palpable lymphadenopathy in the axillary or inguinal. Minimal subcentimeter cervical lymph nodes. LUNGS: clear to auscultation with normal respiratory  effort HEART: regular rate & rhythm,  no murmurs and no lower extremity edema ABDOMEN: abdomen soft, non-tender, normoactive bowel sounds  Musculoskeletal: no cyanosis of digits and no clubbing  PSYCH: alert & oriented x 3 with fluent speech NEURO: no focal motor/sensory deficits  LABORATORY DATA:  I have reviewed the data as listed  . CBC Latest Ref Rng & Units 10/28/2016 05/21/2016 09/30/2015  WBC 3.9 - 10.3 10e3/uL 11.6(H) 10.6(H) 11.0(H)  Hemoglobin 11.6 - 15.9 g/dL 12.9 14.5 14.3  Hematocrit 34.8 - 46.6 % 40.3 44.6 42.6  Platelets 145 - 400 10e3/uL 155 151 168   . CBC    Component Value Date/Time   WBC 11.6 (H) 10/28/2016 1055   WBC 10.6 (H) 05/21/2016 1016   RBC 4.32 10/28/2016 1055   RBC 4.89 05/21/2016 1016   HGB 12.9 10/28/2016 1055   HCT 40.3 10/28/2016 1055   PLT 155 10/28/2016 1055   MCV 93.3 10/28/2016 1055   MCH 29.9 10/28/2016 1055   MCH 29.7 05/21/2016 1016   MCHC 32.0 10/28/2016 1055   MCHC 32.5 05/21/2016 1016   RDW 14.8 (H) 10/28/2016 1055   LYMPHSABS 6.2 (H) 10/28/2016 1055   MONOABS 0.5 10/28/2016 1055   EOSABS 0.1 10/28/2016 1055   BASOSABS 0.0 10/28/2016 1055    . CMP Latest Ref Rng & Units 10/28/2016 05/21/2016 09/30/2015  Glucose 70 - 140 mg/dl 97 99 91  BUN 7.0 - 26.0 mg/dL 19.9 18 20.8  Creatinine 0.6 - 1.1 mg/dL 0.8 0.78 0.9  Sodium 136 - 145 mEq/L 145 137 142  Potassium 3.5 - 5.1 mEq/L 4.2 4.0 4.8  Chloride 101 - 111 mmol/L - 104 -  CO2 22 - 29 mEq/L _0 Calcium 8.4 - 10.4 mg/dL 9.6 9.7 10.0  Total Protein 6.4 - 8.3 g/dL 7.2 - 7.8  Total Bilirubin 0.20 - 1.20 mg/dL 0.40 - 0.37  Alkaline Phos 40 - 150 U/L 77 - 76  AST 5 - 34 U/L 24 - 33  ALT 0 - 55 U/L 10 - 16   . Lab Results  Component Value Date   LDH 218 09/30/2015      RADIOGRAPHIC STUDIES: I have personally reviewed the radiological images as  listed and agreed with the findings in the report. No results found.  ASSESSMENT & PLAN:   83 year old patient female with  multiple medical comorbidities with  1) Low grade B-cell lymphoproliferative disorder. 78% of 7.2k lymphocytes have abnormal phenotype  CD10, CD19, CD20, CD21, CD22, CD23, HLA-Dr, Kappa   PET/CT shows no evidence of lymphoma. Patient currently has no constitutional symptoms. No anemia, thrombocytopenia or neutropenia. No issues with frequent infections. No issues with bleeding or bruising. LDH is within normal limits No hepatosplenomegaly on clinical examination Patient was a chronic smoker until 2013 - this can sometimes cause a persistent polyclonal lymphocytosis even after smoking cessation. No clinical history suggesting other chronic inflammatory conditions. Plan - Labs today show stable lymphocytosis with no anemia,thrombocytopenia or new progressive LNadenopathy or hepatosplenomegaly. -no indication for additional workup or treatment at this time. -Also given her multiple medical comorbidities including Alzheimer's/vascular dementia we would have a high threshold to consider subjecting her to significant workup including Bone marrow biopsy or treatment. -patient and her husband are comfortable with being seen in 56mofor a recheck, sooner if new symptoms arise.   2). Patient Active Problem List   Diagnosis Date Noted  . Protein-calorie malnutrition, severe 12/20/2014  . Lumbar compression fracture ([place]   . Bradycardia 12/19/2014  . Intractable low back pain 12/19/2014  . Pancreatitis 12/19/2014  . Compression fracture 12/19/2014  . Hypokalemia 12/19/2014  . Lung nodule 12/19/2014  . Aortic aneurysm ([place] 12/19/2014  . Mild dementia 08/20/2014  . Preop cardiovascular exam 04/17/2014  . H/O mitral valve repair 04/17/2014  . Fracture of femoral neck, right ([place] 04/16/2014  . Femoral neck fracture ([place] 04/16/2014  . Hypoxia 12/29/2013  . Acute diastolic CHF (congestive heart failure) ([place] 12/29/2013  . SOB (shortness of breath)   . HLD (hyperlipidemia)   . Aortic  atherosclerosis ([place] 10/25/2012  . Mild malnutrition ([place] 10/25/2012  . S/P MVR (mitral valve repair) 01/17/2012  . S/P mitral valve repair 10/12/2011  . S/P CABG x 3 10/12/2011  . Coronary artery disease 10/04/2011  . Congestive heart failure ([place] 10/04/2011  . Shortness of breath 09/28/2011  . Right inguinal hernia 09/01/2011  . Mitral valve disorders(424.0) 02/08/2011  . Mitral regurgitation due to cusp prolapse 11/23/2010  . Cardiac tumor, ventricular 11/23/2010  . Mitral regurgitation 11/12/2010  . Essential hypertension, benign 11/12/2010   -Continue follow-up with primary care physician for management of other medical comorbidities.  RTC with Dr KIrene Limboin 6 months with labs. Earlier if any new concerns.  All of the patients questions were answered with apparent satisfaction. The patient knows to call the clinic with any problems, questions or concerns.  I spent 20 minutes counseling the patient face to face. The total time spent in the appointment was 25 minutes and more than 50% was on counseling and direct patient cares.  GSullivan LoneMD MGlasgowAAHIVMS SNorthwest Medical CenterCMt Airy Ambulatory Endoscopy Surgery CenterHematology/Oncology Physician [place] (Office):       [phone](Work cell):  [phone](Fax):           [phone] This document serves as a record of services personally performed by GSullivan Lone MD. It was created on his behalf by WReola Mosher a trained medical scribe. The creation of this record is based on the scribe's personal observations and the provider's statements to them. This document has been checked and approved by the attending provider.

## 2016-11-11 LAB — FISH,CLL PROGNOSTIC PANEL

## 2016-11-12 ENCOUNTER — Encounter: Payer: Self-pay | Admitting: Cardiology

## 2016-11-23 ENCOUNTER — Ambulatory Visit (INDEPENDENT_AMBULATORY_CARE_PROVIDER_SITE_OTHER): Payer: Medicare Other | Admitting: Cardiology

## 2016-11-23 ENCOUNTER — Encounter: Payer: Self-pay | Admitting: Cardiology

## 2016-11-23 VITALS — BP 150/80 | HR 75 | Ht 60.0 in | Wt 100.0 lb

## 2016-11-23 DIAGNOSIS — E441 Mild protein-calorie malnutrition: Secondary | ICD-10-CM

## 2016-11-23 DIAGNOSIS — I2510 Atherosclerotic heart disease of native coronary artery without angina pectoris: Secondary | ICD-10-CM

## 2016-11-23 DIAGNOSIS — R0989 Other specified symptoms and signs involving the circulatory and respiratory systems: Secondary | ICD-10-CM | POA: Diagnosis not present

## 2016-11-23 DIAGNOSIS — I10 Essential (primary) hypertension: Secondary | ICD-10-CM | POA: Diagnosis not present

## 2016-11-23 DIAGNOSIS — I2583 Coronary atherosclerosis due to lipid rich plaque: Secondary | ICD-10-CM

## 2016-11-23 DIAGNOSIS — I6523 Occlusion and stenosis of bilateral carotid arteries: Secondary | ICD-10-CM

## 2016-11-23 DIAGNOSIS — Z9889 Other specified postprocedural states: Secondary | ICD-10-CM

## 2016-11-23 DIAGNOSIS — Z951 Presence of aortocoronary bypass graft: Secondary | ICD-10-CM | POA: Diagnosis not present

## 2016-11-23 NOTE — Progress Notes (Signed)
Cardiology Office Note    Date:  11/23/2016   ID:  Rether, Rison Mar 20, 1927, MRN [id number]  PCP:  Harlan Stains, MD  Cardiologist:   Candee Furbish, MD     History of Present Illness:  Victoria Sexton is a 83 y.o. female here for follow-up status post bypass surgery as well as mitral valve repair by Dr. Roxy Manns on 10/12/11.  - LIMA to LAD  - SVG to RCA  - SVG to obtuse marginal,  - with patent foramen ovale closure  - benign-appearing endocardial mass removal   She's had problems in the past with orthostatic hypotension especially after being diuresed. She's been experiencing some memory loss, falls.  She is maintaining her weight however I encouraged her to try to eat more protein, increased. She's had some problems with memory. She is here with her husband who is a retired Designer, multimedia from [employer].  Hip fx 2016. Overall she had one episode of shortness of breath with activity that he noticed. She downplays this. She has not had any since. This was short-lived duration of a few minutes. No chest pain. No syncope. Her husband, Ed, tells me that they have updated the will and made funeral home arrangements.   Past Medical History:  Diagnosis Date  . Anginal pain ([place])   . Aortic aneurysm ([place])   . Aortic atherosclerosis ([place]) 10/25/2012   Upper aortic atherosclerosis seen on CT scan 08/13/2002  . Cardiac tumor, ventricular    LVOT  . Congestive heart failure ([place]) 10/04/2011  . Coronary artery disease 10/04/2011  . Heart murmur   . Hernia, hiatal    Left wears a hernia belt  . Hiatal hernia   . Hx of CABG    -LIMA to LAD,SVG to RCA, SVG to OM, mitral valve repair, patent foramen ovale closure, benign-apearing mass removal.  . Hypertension   . Inguinal hernia right  . Lumbar compression fracture ([place])   . Mild malnutrition ([place]) 10/25/2012  . Mitral regurgitation due to cusp prolapse   . MVP (mitral valve prolapse)   . Osteoporosis   . PVC (premature ventricular contraction)     . S/P CABG x 3 10/12/2011   LIMA to LAD, SVG to OM, SVG to RCA, EVH via left thigh and leg  . S/P mitral valve repair 10/12/2011   Complex valvuloplasty including triangular resection of flail posterior leaflet with artificial Goretex neocord placement x2 and 26 mm Sorin Memo 3D ring annuloplasty  . Shortness of breath   . Skin cancer 2013   Face.  . Thoracic compression fracture [facility])     Past Surgical History:  Procedure Laterality Date  . ABDOMINAL HYSTERECTOMY    . BACK SURGERY    . CORONARY ARTERY BYPASS GRAFT  10/12/2011   Procedure: CORONARY ARTERY BYPASS GRAFTING (CABG);  Surgeon: Rexene Alberts, MD;  Location: [facility];  Service: Open Heart Surgery;  Laterality: N/A;  RESECTION OF LV MASS  . HEMORRHOID SURGERY    . HEMORROIDECTOMY    . HIP ARTHROPLASTY Right 04/17/2014   Procedure: ARTHROPLASTY BIPOLAR HIP;  Surgeon: Paralee Cancel, MD;  [facility];  Service: Orthopedics;  Laterality: Right;  . LEFT AND RIGHT HEART CATHETERIZATION WITH CORONARY ANGIOGRAM N/A 09/29/2011   Procedure: LEFT AND RIGHT HEART CATHETERIZATION WITH CORONARY ANGIOGRAM;  Surgeon: Candee Furbish, MD;  Location: [facility] CATH LAB;  Service: Cardiovascular;  Laterality: N/A;  . leg fx repair     Right  . MITRAL VALVE REPAIR  10/12/2011   Procedure: MITRAL VALVE REPAIR (MVR);  Surgeon: Rexene Alberts, MD;  Location: [facility];  Service: Open Heart Surgery;  Laterality: N/A;  MITRAL VALVE REPAIR  . PATENT FORAMEN OVALE CLOSURE  10/12/2011   Procedure: PATENT FORAMEN OVALE CLOSURE;  Surgeon: Rexene Alberts, MD;  Location: [facility];  Service: Open Heart Surgery;  Laterality: N/A;  . SKIN CANCER EXCISION  2013   not melanoma  . TEE WITHOUT CARDIOVERSION  11/13/2010   Procedure: TRANSESOPHAGEAL ECHOCARDIOGRAM (TEE);  Surgeon: Candee Furbish, MD;  Location: [facility] ENDOSCOPY;  Service: Cardiovascular;  Laterality: N/A;  . TOTAL HIP ARTHROPLASTY Right     Current Medications: Outpatient Medications Prior to Visit  Medication Sig  Dispense Refill  . aspirin 81 MG tablet Take 81 mg by mouth daily.    . Cholecalciferol (VITAMIN D3) 2000 UNITS TABS Take 2,000 Units by mouth daily.     . Coenzyme Q10 (CO Q-10) 100 MG CAPS Take 100 mg by mouth daily.     [name] lisinopril (PRINIVIL,ZESTRIL) 20 MG tablet Take 1 tablet (20 mg total) by mouth daily. 90 tablet 3  . Memantine HCl (NAMENDA PO) Take 1 tablet by mouth daily.    . metoprolol tartrate (LOPRESSOR) 25 MG tablet Take 0.5 tablets (12.5 mg total) by mouth 2 (two) times daily. 90 tablet 3  . multivitamin-lutein (OCUVITE-LUTEIN) CAPS Take 2 capsules by mouth daily.     . Nutritional Supplements (ENSURE ENLIVE PO) Take by mouth 2 (two) times daily.    [name] OVER THE COUNTER MEDICATION Take 1 tablet by mouth daily. Omega Red    . Rivastigmine 13.3 MG/24HR PT24 Place 13.3 mg onto the skin at bedtime.     . simvastatin (ZOCOR) 5 MG tablet Take 1 tablet (5 mg total) by mouth daily. 90 tablet 3  . vitamin E 400 UNIT capsule Take 400 Units by mouth daily.     No facility-administered medications prior to visit.      Allergies:   Hydrocodone-acetaminophen; Penicillins; and Sulfa antibiotics   Social History   Socioeconomic History  . Marital status: Married    Spouse name: Ed  . Number of children: 0  . Years of education: 37  . Highest education level: None  Social Needs  . Financial resource strain: None  . Food insecurity - worry: None  . Food insecurity - inability: None  . Transportation needs - medical: None  . Transportation needs - non-medical: None  Occupational History  . Occupation: retired    Comment: Science writer  Tobacco Use  . Smoking status: Former Smoker    Packs/day: 0.50    Years: 30.00    Pack years: 15.00    Types: Cigarettes    Last attempt to quit: 11/13/2010    Years since quitting: 6.0  . Smokeless tobacco: Never Used  Substance and Sexual Activity  . Alcohol use: No  . Drug use: No  . Sexual activity: None    Comment: did not ask  Other Topics  Concern  . None  Social History Narrative   Lives at home with husband   Caffeine use - coffee 4-5 cups decaf daily     Family History:  The patient's family history includes Heart attack in her mother; Lung cancer in her father.   ROS:   Please see the history of present illness.    ROS All other systems reviewed and are negative.   PHYSICAL EXAM:   VS:  BP (!) 150/80   Pulse  75   Ht 5' (1.524 m)   Wt 100 lb (45.4 kg)   SpO2 97%   BMI 19.53 kg/m    GEN: Thin, in no acute distress  HEENT: normal  Neck: no JVD, + right carotid bruit, or masses Cardiac: RRR; 1/6 SM, no rubs, or gallops,no edema  Respiratory:  clear to auscultation bilaterally, normal work of breathing GI: soft, nontender, nondistended, + BS MS: no deformity or atrophy  Skin: warm and dry, no rash Neuro:  Alert and Oriented x 3, Strength and sensation are intact Psych: euthymic mood, full affect   Wt Readings from Last 3 Encounters:  11/23/16 100 lb (45.4 kg)  10/28/16 100 lb (45.4 kg)  11/10/15 100 lb 6.4 oz (45.5 kg)      Studies/Labs Reviewed:   EKG:  Today 11/23/16-sinus rhythm 75 with rare PVC, nonspecific ST-T wave changes personally viewed-prior 04/27/16-sinus rhythm 80 with no other significant abnormalities.  Recent Labs: 10/28/2016: ALT 10; BUN 19.9; Creatinine 0.8; HGB 12.9; Platelets 155; Potassium 4.2; Sodium 145   Lipid Panel    Component Value Date/Time   CHOL 172 11/16/2013 0953   TRIG 83.0 11/16/2013 0953   HDL 53.00 11/16/2013 0953   CHOLHDL 3 11/16/2013 0953   VLDL 16.6 11/16/2013 0953   LDLCALC 102 (H) 11/16/2013 0953    Additional studies/ records that were reviewed today include:  Prior office notes, echocardiogram, EKGs reviewed, lab work reviewed  ECHO: 10/18/11  - Left ventricle: The cavity size was normal. There was severe concentric hypertrophy. The estimated ejection fraction was 55%. Wall motion was normal; there were no regional wall motion  abnormalities. There was an increased relative contribution of atrial contraction to ventricular filling. Doppler parameters are consistent with abnormal left ventricular relaxation (grade 1 diastolic dysfunction). - Aortic valve: Moderate thickening and calcification, consistent with sclerosis. - Mitral valve: By mean gradient there appears to be moderate mitral stenosis but MVA by pressure half-time appears normal. Prior procedures included surgical repair. An annular ring prosthesis was present. Moderate thickening, with moderate involvement of chords. - Left atrium: The atrium was mild to moderately dilated.  ASSESSMENT:    1. Coronary artery disease due to lipid rich plaque   2. Essential hypertension, benign   3. Bruit of right carotid artery   4. S/P CABG x 3   5. Mild malnutrition ([place])   6. S/P mitral valve repair   7. Bilateral carotid artery stenosis      PLAN:  In order of problems listed above:  Coronary artery disease  - Prior bypass surgery 3 in 2013, no anginal symptoms, doing well.  - Brief symptoms of shortness of breath. We will keep an eye on this. No change in her heart murmur. Dental prophylaxis.  Overall doing well.  Mitral valve repair  - Soft murmur heard at apex. Dental prophylaxis.  No changes.  Protein calorie malnutrition   - Encourage continue protein intake, she has had prior bone loss-fractures  Memory loss  -No significant advancement.  Carotid bruits/carotid artery disease - Right-sided was 40-59% back in 2013.  I do hear a right-sided carotid bruit.  We will repeat Dopplers.  Explained rationale.  Medication Adjustments/Labs and Tests Ordered: Current medicines are reviewed at length with the patient today.  Concerns regarding medicines are outlined above.  Medication changes, Labs and Tests ordered today are listed in the Patient Instructions below. Patient Instructions  Medication Instructions:  The current  medical regimen is effective;  continue present plan and  medications.  Testing/Procedures: Your physician has requested that you have a carotid duplex. This test is an ultrasound of the carotid arteries in your neck. It looks at blood flow through these arteries that supply the brain with blood. Allow one hour for this exam. There are no restrictions or special instructions.  Follow-Up: Follow up in 6 months with Dr. Marlou Porch.  You will receive a letter in the mail 2 months before you are due.  Please call us when you receive this letter to schedule your follow up appointment.  If you need a refill on your cardiac medications before your next appointment, please call your pharmacy.  Thank you for choosing [facility]!!        Signed, Candee Furbish, MD  11/23/2016 4:17 PM    [facility] [place], [place], [place]  [address] Phone: [phone]; Fax: [phone]

## 2016-11-23 NOTE — Patient Instructions (Addendum)
Medication Instructions:  The current medical regimen is effective;  continue present plan and medications.  Testing/Procedures: Your physician has requested that you have a carotid duplex. This test is an ultrasound of the carotid arteries in your neck. It looks at blood flow through these arteries that supply the brain with blood. Allow one hour for this exam. There are no restrictions or special instructions.  Follow-Up: Follow up in 6 months with Dr. Marlou Porch.  You will receive a letter in the mail 2 months before you are due.  Please call us when you receive this letter to schedule your follow up appointment.  If you need a refill on your cardiac medications before your next appointment, please call your pharmacy.  Thank you for choosing [facility]!!

## 2016-12-09 ENCOUNTER — Ambulatory Visit (HOSPITAL_COMMUNITY)
Admission: RE | Admit: 2016-12-09 | Discharge: 2016-12-09 | Disposition: A | Discharge status: Home / Self Care | Payer: Medicare Other | Source: Ambulatory Visit | Attending: Cardiology | Admitting: Cardiology

## 2016-12-09 DIAGNOSIS — R0989 Other specified symptoms and signs involving the circulatory and respiratory systems: Secondary | ICD-10-CM | POA: Diagnosis present

## 2016-12-09 DIAGNOSIS — I6523 Occlusion and stenosis of bilateral carotid arteries: Secondary | ICD-10-CM | POA: Diagnosis not present

## 2016-12-14 ENCOUNTER — Other Ambulatory Visit: Payer: Self-pay | Admitting: Cardiology

## 2016-12-16 ENCOUNTER — Telehealth: Payer: Self-pay | Admitting: Cardiology

## 2016-12-16 NOTE — Telephone Encounter (Signed)
Pt aware of carotid results ./cy 

## 2016-12-16 NOTE — Telephone Encounter (Signed)
Victoria Sexton is calling about results of Ultra Sound from Thursday of last week . Please call

## 2017-01-26 ENCOUNTER — Ambulatory Visit
Admission: RE | Admit: 2017-01-26 | Discharge: 2017-01-26 | Disposition: A | Discharge status: Home / Self Care | Payer: Medicare Other | Source: Ambulatory Visit | Attending: Family Medicine | Admitting: Family Medicine

## 2017-01-26 ENCOUNTER — Other Ambulatory Visit: Payer: Self-pay | Admitting: Family Medicine

## 2017-01-26 DIAGNOSIS — M25551 Pain in right hip: Secondary | ICD-10-CM

## 2017-07-06 ENCOUNTER — Telehealth: Payer: Self-pay

## 2017-07-06 NOTE — Telephone Encounter (Signed)
Per 7/2 sch msg I Levada Dy) called the patient and her husband answered. And had several questions to why was his wife scheduled with Irene Limbo again, and who ordered it?. Due to his unknowing voice tone  I spoke with my Leader (Melissa D.) and under her advice I was directed to Call the office of the provider who put in the request for her to return with a new appointment.Harlan Stains). To call and explain to the patient and family her current health concerns. I Did as direct.

## 2017-07-11 NOTE — Telephone Encounter (Signed)
Per 7/8 Erica from Dr. Dema Severin office called to verify that Victoria Sexton)  husband is going to keep the appointment for 7/12 @11 :47 and bring her on that date and time.

## 2017-07-14 NOTE — Progress Notes (Signed)
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 female who has been referred to [place] by Dr .Harlan Stains, MD for evaluation and management of chronic lymphocytosis.  She has a history of multiple medical comorbidities including hypertension, dyslipidemia, coronary artery disease status post CABG, osteoporosis, Alzheimer's dementia with possible vascular dementia who based on outside labs by her primary care physician appears to have had persistent lymphocytosis in the range of 5.8k to 7.2k at least since April 2016. Her hemoglobin and platelet counts have remained normal. She has not had any overt clinically evident significant lymphadenopathy. Has not noted any abdominal pain or distention. No fevers chills night sweats or unexpected weight loss. She reports that she feels about the same today that she felt 6-12 months ago. She previously was a cigarette smoker but quit in 2013 after her heart issues emerged.  She has no other acute concerns at this time. Husband is present at this clinic visit. He is a English as a second language teacher. Notes gradually worsening memory function. Fondly recalls his time in the armed forces. He helped fill in the gaps in her history since she isn't limited historian due to her memory limitations.  INTERVAL HISTORY  AMERY VANDENBOS returns today regarding her Low grade B-cell lymphoproliferative disorder. The patient's last visit with [place] was on 10/28/16. She is accompanied today by her husband. The pt reports that she is doing well overall.   The pt reports that she has no new concerns. She denies any constitutional symptoms. Her husband notes that her appetite has weakened some. They are maintaining active follow up with her dermatologist regarding  two skin cancers on her legs which have been biopsied.   Lab results today (07/15/17) of CBC w/diff, CMP, and Reticulocytes are all yet to be drawn as the pt did not go to lab prior to clinic visit.  Given patient's dementia husband is very hesitant for her to have to wait for labs or physician appointment and does not want her to have to see multiple physicians and wants to monitor her labs with PCP.  On review of systems, pt reports weakened appetite, and denies pain along the spine, fevers, chills, nigh sweats, abdominal pains, and any other symptoms.   MEDICAL HISTORY:  Past Medical History:  Diagnosis Date  . Anginal pain ([place])   . Aortic aneurysm ([place])   . Aortic atherosclerosis ([place]) 10/25/2012   Upper aortic atherosclerosis seen on CT scan 08/13/2002  . Cardiac tumor, ventricular    LVOT  . Congestive heart failure ([place]) 10/04/2011  . Coronary artery disease 10/04/2011  . Heart murmur   . Hernia, hiatal    Left wears a hernia belt  . Hiatal hernia   . Hx of CABG    -LIMA to LAD,SVG to RCA, SVG to OM, mitral valve repair, patent foramen ovale closure, benign-apearing mass removal.  . Hypertension   . Inguinal hernia right  . Lumbar compression fracture ([place])   . Mild malnutrition ([place]) 10/25/2012  . Mitral regurgitation due to cusp prolapse   . MVP (mitral valve prolapse)   . Osteoporosis   . PVC (premature ventricular contraction)   . S/P CABG x 3 10/12/2011   LIMA to LAD, SVG to OM, SVG to RCA, [facility]  via left thigh and leg  . S/P mitral valve repair 10/12/2011   Complex valvuloplasty including triangular resection of flail posterior leaflet with artificial Goretex neocord placement x2 and 26 mm Sorin Memo 3D ring annuloplasty  . Shortness of breath   . Skin cancer 2013   Face.  . Thoracic compression fracture [facility])     SURGICAL HISTORY: Past Surgical History:  Procedure Laterality Date  . ABDOMINAL HYSTERECTOMY    . BACK SURGERY    . CORONARY ARTERY BYPASS GRAFT   10/12/2011   Procedure: CORONARY ARTERY BYPASS GRAFTING (CABG);  Surgeon: Rexene Alberts, MD;  Location: [facility];  Service: Open Heart Surgery;  Laterality: N/A;  RESECTION OF LV MASS  . HEMORRHOID SURGERY    . HEMORROIDECTOMY    . HIP ARTHROPLASTY Right 04/17/2014   Procedure: ARTHROPLASTY BIPOLAR HIP;  Surgeon: Paralee Cancel, MD;  Location: [facility];  Service: Orthopedics;  Laterality: Right;  . LEFT AND RIGHT HEART CATHETERIZATION WITH CORONARY ANGIOGRAM N/A 09/29/2011   Procedure: LEFT AND RIGHT HEART CATHETERIZATION WITH CORONARY ANGIOGRAM;  Surgeon: Candee Furbish, MD;  Location: [facility] CATH LAB;  Service: Cardiovascular;  Laterality: N/A;  . leg fx repair     Right  . MITRAL VALVE REPAIR  10/12/2011   Procedure: MITRAL VALVE REPAIR (MVR);  Surgeon: Rexene Alberts, MD;  Location: [facility];  Service: Open Heart Surgery;  Laterality: N/A;  MITRAL VALVE REPAIR  . PATENT FORAMEN OVALE CLOSURE  10/12/2011   Procedure: PATENT FORAMEN OVALE CLOSURE;  Surgeon: Rexene Alberts, MD;  Location: [facility];  Service: Open Heart Surgery;  Laterality: N/A;  . SKIN CANCER EXCISION  2013   not melanoma  . TEE WITHOUT CARDIOVERSION  11/13/2010   Procedure: TRANSESOPHAGEAL ECHOCARDIOGRAM (TEE);  Surgeon: Candee Furbish, MD;  Location: [facility] ENDOSCOPY;  Service: Cardiovascular;  Laterality: N/A;  . TOTAL HIP ARTHROPLASTY Right     SOCIAL HISTORY: Social History   Socioeconomic History  . Marital status: Married    Spouse name: Ed  . Number of children: 0  . Years of education: 73  . Highest education level: Not on file  Occupational History  . Occupation: retired    Comment: Financial controller  . Financial resource strain: Not on file  . Food insecurity:    Worry: Not on file    Inability: Not on file  . Transportation needs:    Medical: Not on file    Non-medical: Not on file  Tobacco Use  . Smoking status: Former Smoker    Packs/day: 0.50    Years: 30.00    Pack years: 15.00    Types: Cigarettes    Last  attempt to quit: 11/13/2010    Years since quitting: 6.6  . Smokeless tobacco: Never Used  Substance and Sexual Activity  . Alcohol use: No  . Drug use: No  . Sexual activity: Not on file    Comment: did not ask  Lifestyle  . Physical activity:    Days per week: Not on file    Minutes per session: Not on file  . Stress: Not on file  Relationships  . Social connections:    Talks on phone: Not on file    Gets together: Not on file    Attends religious service: Not on file    Active member of club or organization: Not on file    Attends meetings of clubs or organizations: Not on file    Relationship status: Not on file  .  Intimate partner violence:    Fear of current or ex partner: Not on file    Emotionally abused: Not on file    Physically abused: Not on file    Forced sexual activity: Not on file  Other Topics Concern  . Not on file  Social History Narrative   Lives at home with husband   Caffeine use - coffee 4-5 cups decaf daily    FAMILY HISTORY: Family History  Problem Relation Age of Onset  . Heart attack Mother   . Lung cancer Father     ALLERGIES:  is allergic to hydrocodone-acetaminophen; penicillins; and sulfa antibiotics.  MEDICATIONS:  Current Outpatient Medications  Medication Sig Dispense Refill  . aspirin 81 MG tablet Take 81 mg by mouth daily.    . Cholecalciferol (VITAMIN D3) 2000 UNITS TABS Take 2,000 Units by mouth daily.     . Coenzyme Q10 (CO Q-10) 100 MG CAPS Take 100 mg by mouth daily.     [name] lisinopril (PRINIVIL,ZESTRIL) 20 MG tablet Take 1 tablet (20 mg total) by mouth daily. 90 tablet 3  . Memantine HCl (NAMENDA PO) Take 1 tablet by mouth daily.    . metoprolol tartrate (LOPRESSOR) 25 MG tablet Take 0.5 tablets (12.5 mg total) by mouth 2 (two) times daily. 90 tablet 3  . multivitamin-lutein (OCUVITE-LUTEIN) CAPS Take 2 capsules by mouth daily.     . Nutritional Supplements (ENSURE ENLIVE PO) Take by mouth 2 (two) times daily.    [name] OVER THE  COUNTER MEDICATION Take 1 tablet by mouth daily. Omega Red    . Rivastigmine 13.3 MG/24HR PT24 Place 13.3 mg onto the skin at bedtime.     . simvastatin (ZOCOR) 5 MG tablet TAKE 1 TABLET EACH DAY. 90 tablet 3  . vitamin E 400 UNIT capsule Take 400 Units by mouth daily.     No current facility-administered medications for this visit.     REVIEW OF SYSTEMS:    A 10+ POINT REVIEW OF SYSTEMS WAS OBTAINED including neurology, dermatology, psychiatry, cardiac, respiratory, lymph, extremities, GI, GU, Musculoskeletal, constitutional, breasts, reproductive, HEENT.  All pertinent positives are noted in the HPI.  All others are negative.    PHYSICAL EXAMINATION: ECOG PERFORMANCE STATUS: 2 - Symptomatic, <50% confined to bed  Vitals:   07/15/17 1113  BP: (!) 191/74  Pulse: 71  Resp: 17  Temp: 97.9 F (36.6 C)  SpO2: 98%   Filed Weights   07/15/17 1113  Weight: 108 lb 12.8 oz (49.4 kg)   .Body mass index is 21.25 kg/m.  GENERAL:alert, in no acute distress and comfortable SKIN: no acute rashes, no significant lesions EYES: conjunctiva are pink and non-injected, sclera anicteric OROPHARYNX: MMM, no exudates, no oropharyngeal erythema or ulceration NECK: supple, no JVD LYMPH:  no palpable lymphadenopathy in the axillary or inguinal regions. Minimal subcentimeter cervical LN LUNGS: clear to auscultation b/l with normal respiratory effort HEART: regular rate & rhythm ABDOMEN:  normoactive bowel sounds , non tender, not distended. No palpable hepatosplenomegaly.  Extremity: no pedal edema PSYCH: alert & oriented x 3 with fluent speech NEURO: no focal motor/sensory deficits   LABORATORY DATA:  I have reviewed the data as listed  . CBC Latest Ref Rng & Units 10/28/2016 05/21/2016 09/30/2015  WBC 3.9 - 10.3 10e3/uL 11.6(H) 10.6(H) 11.0(H)  Hemoglobin 11.6 - 15.9 g/dL 12.9 14.5 14.3  Hematocrit 34.8 - 46.6 % 40.3 44.6 42.6  Platelets 145 - 400 10e3/uL 155 151 168   . CBC  Component  Value Date/Time   WBC 11.6 (H) 10/28/2016 1055   WBC 10.6 (H) 05/21/2016 1016   RBC 4.32 10/28/2016 1055   RBC 4.89 05/21/2016 1016   HGB 12.9 10/28/2016 1055   HCT 40.3 10/28/2016 1055   PLT 155 10/28/2016 1055   MCV 93.3 10/28/2016 1055   MCH 29.9 10/28/2016 1055   MCH 29.7 05/21/2016 1016   MCHC 32.0 10/28/2016 1055   MCHC 32.5 05/21/2016 1016   RDW 14.8 (H) 10/28/2016 1055   LYMPHSABS 6.2 (H) 10/28/2016 1055   MONOABS 0.5 10/28/2016 1055   EOSABS 0.1 10/28/2016 1055   BASOSABS 0.0 10/28/2016 1055    . CMP Latest Ref Rng & Units 10/28/2016 05/21/2016 09/30/2015  Glucose 70 - 140 mg/dl 97 99 91  BUN 7.0 - 26.0 mg/dL 19.9 18 20.8  Creatinine 0.6 - 1.1 mg/dL 0.8 0.78 0.9  Sodium 136 - 145 mEq/L 145 137 142  Potassium 3.5 - 5.1 mEq/L 4.2 4.0 4.8  Chloride 101 - 111 mmol/L - 104 -  CO2 22 - 29 mEq/L _0 Calcium 8.4 - 10.4 mg/dL 9.6 9.7 10.0  Total Protein 6.4 - 8.3 g/dL 7.2 - 7.8  Total Bilirubin 0.20 - 1.20 mg/dL 0.40 - 0.37  Alkaline Phos 40 - 150 U/L 77 - 76  AST 5 - 34 U/L 24 - 33  ALT 0 - 55 U/L 10 - 16   . Lab Results  Component Value Date   LDH 217 10/28/2016      RADIOGRAPHIC STUDIES: I have personally reviewed the radiological images as listed and agreed with the findings in the report. No results found.  ASSESSMENT & PLAN:   84 y.o.  patient female with multiple medical comorbidities with  1) Low grade B-cell lymphoproliferative disorder. 78% of 7.2k lymphocytes have abnormal phenotype  CD10, CD19, CD20, CD21, CD22, CD23, HLA-Dr, Kappa   PET/CT shows no evidence of lymphoma. Patient currently has no constitutional symptoms. No anemia, thrombocytopenia or neutropenia. No issues with frequent infections. No issues with bleeding or bruising. LDH is within normal limits No hepatosplenomegaly on clinical examination Patient was a chronic smoker until 2013  No clinical history suggesting other chronic inflammatory conditions.  PLAN: -Also given  her multiple medical comorbidities including Alzheimer's/vascular dementia we would have a high threshold to consider subjecting her to significant workup including Bone marrow biopsy or treatment. -a conservative f/u and mx is what her husband chooses as well. -Discussed that though the pt did not yet complete labs today, her condition has been overall very stable and she has no significant new symptoms. -Pt and husband decide to continue lab and clinical f/u with PCP.  2). Patient Active Problem List   Diagnosis Date Noted  . Protein-calorie malnutrition, severe 12/20/2014  . Lumbar compression fracture ([place])   . Bradycardia 12/19/2014  . Intractable low back pain 12/19/2014  . Pancreatitis 12/19/2014  . Compression fracture 12/19/2014  . Hypokalemia 12/19/2014  . Lung nodule 12/19/2014  . Aortic aneurysm ([place]) 12/19/2014  . Mild dementia 08/20/2014  . Preop cardiovascular exam 04/17/2014  . H/O mitral valve repair 04/17/2014  . Fracture of femoral neck, right ([place]) 04/16/2014  . Femoral neck fracture ([place]) 04/16/2014  . Hypoxia 12/29/2013  . Acute diastolic CHF (congestive heart failure) ([place]) 12/29/2013  . SOB (shortness of breath)   . HLD (hyperlipidemia)   . Aortic atherosclerosis ([place]) 10/25/2012  . Mild malnutrition ([place]) 10/25/2012  . S/P MVR (mitral valve repair) 01/17/2012  .  S/P mitral valve repair 10/12/2011  . S/P CABG x 3 10/12/2011  . Coronary artery disease 10/04/2011  . Congestive heart failure ([place]) 10/04/2011  . Shortness of breath 09/28/2011  . Right inguinal hernia 09/01/2011  . Mitral valve disorders(424.0) 02/08/2011  . Mitral regurgitation due to cusp prolapse 11/23/2010  . Cardiac tumor, ventricular 11/23/2010  . Mitral regurgitation 11/12/2010  . Essential hypertension, benign 11/12/2010   -Continue follow-up with primary care physician for management of other medical comorbidities.   Labs today RTC with Dr Irene Limbo as needed based on patients and  her husbands decision - no minimize stress on patient. Labs q6 months with PCP  to monitor blood counts   All of the patients questions were answered with apparent satisfaction. The patient knows to call the clinic with any problems, questions or concerns.  The total time spent in the appt was 20 minutes and more than 50% was on counseling and direct patient cares.  Sullivan Lone MD [place] AAHIVMS [facility] Hematology/Oncology Physician [facility]  (Office):       [phone] (Work cell):  [phone] (Fax):           [phone]  I, Baldwin [place], am acting as a scribe for Dr Irene Limbo.   .I have reviewed the above documentation for accuracy and completeness, and I agree with the above. Brunetta Genera MD

## 2017-07-15 ENCOUNTER — Telehealth: Payer: Self-pay

## 2017-07-15 ENCOUNTER — Inpatient Hospital Stay: Payer: Medicare Other | Attending: Hematology | Admitting: Hematology

## 2017-07-15 ENCOUNTER — Encounter: Payer: Self-pay | Admitting: Hematology

## 2017-07-15 VITALS — BP 191/74 | HR 71 | Temp 97.9°F | Resp 17 | Ht 60.0 in | Wt 108.8 lb

## 2017-07-15 DIAGNOSIS — F0280 Dementia in other diseases classified elsewhere without behavioral disturbance: Secondary | ICD-10-CM | POA: Diagnosis not present

## 2017-07-15 DIAGNOSIS — G309 Alzheimer's disease, unspecified: Secondary | ICD-10-CM | POA: Diagnosis not present

## 2017-07-15 DIAGNOSIS — Z8579 Personal history of other malignant neoplasms of lymphoid, hematopoietic and related tissues: Secondary | ICD-10-CM | POA: Diagnosis present

## 2017-07-15 DIAGNOSIS — Z87891 Personal history of nicotine dependence: Secondary | ICD-10-CM | POA: Diagnosis not present

## 2017-07-15 DIAGNOSIS — Z7982 Long term (current) use of aspirin: Secondary | ICD-10-CM

## 2017-07-15 DIAGNOSIS — Z79899 Other long term (current) drug therapy: Secondary | ICD-10-CM | POA: Diagnosis not present

## 2017-07-15 DIAGNOSIS — C9510 Chronic leukemia of unspecified cell type not having achieved remission: Secondary | ICD-10-CM

## 2017-07-15 DIAGNOSIS — Z85828 Personal history of other malignant neoplasm of skin: Secondary | ICD-10-CM | POA: Diagnosis not present

## 2017-07-15 NOTE — Telephone Encounter (Signed)
F/u with PCP . RTC with oncology clinic with different provider as needed (Patient husband requests change of provider). Per 7/12 los

## 2017-08-12 ENCOUNTER — Emergency Department (HOSPITAL_COMMUNITY)
Admission: EM | Admit: 2017-08-12 | Discharge: 2017-08-12 | Disposition: A | Discharge status: Home / Self Care | Payer: Medicare Other | Attending: Family Medicine | Admitting: Family Medicine

## 2017-08-12 ENCOUNTER — Emergency Department (HOSPITAL_COMMUNITY): Payer: Medicare Other

## 2017-08-12 ENCOUNTER — Encounter (HOSPITAL_COMMUNITY): Payer: Self-pay | Admitting: Emergency Medicine

## 2017-08-12 ENCOUNTER — Other Ambulatory Visit: Payer: Self-pay

## 2017-08-12 DIAGNOSIS — Z7982 Long term (current) use of aspirin: Secondary | ICD-10-CM | POA: Diagnosis not present

## 2017-08-12 DIAGNOSIS — I2510 Atherosclerotic heart disease of native coronary artery without angina pectoris: Secondary | ICD-10-CM | POA: Diagnosis not present

## 2017-08-12 DIAGNOSIS — Z79899 Other long term (current) drug therapy: Secondary | ICD-10-CM | POA: Insufficient documentation

## 2017-08-12 DIAGNOSIS — F0390 Unspecified dementia without behavioral disturbance: Secondary | ICD-10-CM | POA: Diagnosis not present

## 2017-08-12 DIAGNOSIS — I10 Essential (primary) hypertension: Secondary | ICD-10-CM | POA: Diagnosis present

## 2017-08-12 DIAGNOSIS — Z87891 Personal history of nicotine dependence: Secondary | ICD-10-CM | POA: Insufficient documentation

## 2017-08-12 DIAGNOSIS — Z9889 Other specified postprocedural states: Secondary | ICD-10-CM

## 2017-08-12 DIAGNOSIS — I110 Hypertensive heart disease with heart failure: Secondary | ICD-10-CM | POA: Diagnosis not present

## 2017-08-12 DIAGNOSIS — R079 Chest pain, unspecified: Secondary | ICD-10-CM | POA: Diagnosis present

## 2017-08-12 DIAGNOSIS — I5030 Unspecified diastolic (congestive) heart failure: Secondary | ICD-10-CM | POA: Insufficient documentation

## 2017-08-12 DIAGNOSIS — Z951 Presence of aortocoronary bypass graft: Secondary | ICD-10-CM

## 2017-08-12 DIAGNOSIS — I509 Heart failure, unspecified: Secondary | ICD-10-CM

## 2017-08-12 HISTORY — DX: Unspecified dementia, unspecified severity, without behavioral disturbance, psychotic disturbance, mood disturbance, and anxiety: F03.90

## 2017-08-12 LAB — CBC
HCT: 42.8 % (ref 36.0–46.0)
Hemoglobin: 13.8 g/dL (ref 12.0–15.0)
MCH: 29.5 pg (ref 26.0–34.0)
MCHC: 32.2 g/dL (ref 30.0–36.0)
MCV: 91.5 fL (ref 78.0–100.0)
PLATELETS: 197 10*3/uL (ref 150–400)
RBC: 4.68 MIL/uL (ref 3.87–5.11)
RDW: 15.3 % (ref 11.5–15.5)
WBC: 11 10*3/uL — ABNORMAL HIGH (ref 4.0–10.5)

## 2017-08-12 LAB — BASIC METABOLIC PANEL
Anion gap: 11 (ref 5–15)
BUN: 22 mg/dL (ref 8–23)
CALCIUM: 9.8 mg/dL (ref 8.9–10.3)
CO2: 27 mmol/L (ref 22–32)
CREATININE: 0.79 mg/dL (ref 0.44–1.00)
Chloride: 106 mmol/L (ref 98–111)
GFR calc Af Amer: >60 mL/min (ref >60)
GFR calc non Af Amer: >60 mL/min (ref >60)
GLUCOSE: 99 mg/dL (ref 70–99)
Potassium: 4.2 mmol/L (ref 3.5–5.1)
Sodium: 144 mmol/L (ref 135–145)

## 2017-08-12 LAB — I-STAT TROPONIN, ED
TROPONIN I, POC: 0 ng/mL (ref 0.00–0.08)
Troponin i, poc: 0.01 ng/mL (ref 0.00–0.08)

## 2017-08-12 MED ORDER — LISINOPRIL 20 MG PO TABS
20.0000 mg | ORAL_TABLET | Freq: Every day | ORAL | Status: DC
  Administered 2017-08-12: 20 mg via ORAL
  Filled 2017-08-12: qty 1

## 2017-08-12 MED ORDER — ISOSORBIDE MONONITRATE ER 30 MG PO TB24: 30.0000 mg | ORAL_TABLET | Freq: Every day | ORAL | 2 refills | Status: DC

## 2017-08-12 MED ORDER — METOPROLOL TARTRATE 25 MG PO TABS
25.0000 mg | ORAL_TABLET | Freq: Two times a day (BID) | ORAL | Status: DC
  Administered 2017-08-12: 25 mg via ORAL
  Filled 2017-08-12: qty 1

## 2017-08-12 MED ORDER — MORPHINE SULFATE (PF) 2 MG/ML IV SOLN
2.0000 mg | INTRAVENOUS | Status: DC | PRN
  Administered 2017-08-12: 2 mg via INTRAVENOUS
  Filled 2017-08-12: qty 1

## 2017-08-12 MED ORDER — ASPIRIN 81 MG PO CHEW
324.0000 mg | CHEWABLE_TABLET | Freq: Once | ORAL | Status: AC
  Administered 2017-08-12: 324 mg via ORAL
  Filled 2017-08-12: qty 4

## 2017-08-12 MED ORDER — NITROGLYCERIN 0.4 MG SL SUBL
0.4000 mg | SUBLINGUAL_TABLET | SUBLINGUAL | Status: DC | PRN
  Administered 2017-08-12: 0.4 mg via SUBLINGUAL
  Filled 2017-08-12: qty 1

## 2017-08-12 MED ORDER — NITROGLYCERIN 2 % TD OINT
1.0000 [in_us] | TOPICAL_OINTMENT | Freq: Three times a day (TID) | TRANSDERMAL | Status: DC
  Administered 2017-08-12: 1 [in_us] via TOPICAL
  Filled 2017-08-12: qty 1

## 2017-08-12 NOTE — ED Provider Notes (Signed)
[facility] DEPT Provider Note   CSN: [id number] Arrival date & time: 08/12/17  1003     History   Chief Complaint Chief Complaint  Patient presents with  . Chest Pain    HPI Victoria Sexton is a 84 y.o. female.  The history is provided by the patient and the spouse. The history is limited by the condition of the patient (Hx dementia).  Chest Pain      Pt was seen at 1020. Per pt and Victoria Sexton husband: c/o gradual onset and persistence of constant mid-sternal chest "pain" that began approximately 1 hour PTA. Pt states the CP began after she got up from sleeping and was walking around Victoria Sexton home. Pt describes the CP as "tightness." Husband endorses pt has "a hx of heart issues." Pt initially stated she had SOB, now denies this. Pt has not taken any of Victoria Sexton usual medications today. Denies N/V/D, no abd pain, no palpitations, no cough, no back pain, no fevers, no injury, no rash.   Past Medical History:  Diagnosis Date  . Anginal pain ([place])   . Aortic aneurysm ([place])   . Aortic atherosclerosis ([place]) 10/25/2012   Upper aortic atherosclerosis seen on CT scan 08/13/2002  . Cardiac tumor, ventricular    LVOT  . Congestive heart failure ([place]) 10/04/2011  . Coronary artery disease 10/04/2011  . Dementia   . Heart murmur   . Hernia, hiatal    Left wears a hernia belt  . Hiatal hernia   . Hx of CABG    -LIMA to LAD,SVG to RCA, SVG to OM, mitral valve repair, patent foramen ovale closure, benign-apearing mass removal.  . Hypertension   . Inguinal hernia right  . Lumbar compression fracture ([place])   . Mild malnutrition ([place]) 10/25/2012  . Mitral regurgitation due to cusp prolapse   . MVP (mitral valve prolapse)   . Osteoporosis   . PVC (premature ventricular contraction)   . S/P CABG x 3 10/12/2011   LIMA to LAD, SVG to OM, SVG to RCA, EVH via left thigh and leg  . S/P mitral valve repair 10/12/2011   Complex valvuloplasty including triangular resection of flail  posterior leaflet with artificial Goretex neocord placement x2 and 26 mm Sorin Memo 3D ring annuloplasty  . Shortness of breath   . Skin cancer 2013   Face.  . Thoracic compression fracture [facility])     Patient Active Problem List   Diagnosis Date Noted  . Protein-calorie malnutrition, severe 12/20/2014  . Lumbar compression fracture ([place])   . Bradycardia 12/19/2014  . Intractable low back pain 12/19/2014  . Pancreatitis 12/19/2014  . Compression fracture 12/19/2014  . Hypokalemia 12/19/2014  . Lung nodule 12/19/2014  . Aortic aneurysm ([place]) 12/19/2014  . Mild dementia 08/20/2014  . Preop cardiovascular exam 04/17/2014  . H/O mitral valve repair 04/17/2014  . Fracture of femoral neck, right ([place]) 04/16/2014  . Femoral neck fracture ([place]) 04/16/2014  . Hypoxia 12/29/2013  . Acute diastolic CHF (congestive heart failure) ([place]) 12/29/2013  . SOB (shortness of breath)   . HLD (hyperlipidemia)   . Aortic atherosclerosis ([place]) 10/25/2012  . Mild malnutrition ([place]) 10/25/2012  . S/P MVR (mitral valve repair) 01/17/2012  . S/P mitral valve repair 10/12/2011  . S/P CABG x 3 10/12/2011  . Coronary artery disease 10/04/2011  . Congestive heart failure ([place]) 10/04/2011  . Shortness of breath 09/28/2011  . Right inguinal hernia 09/01/2011  . Mitral valve disorders(424.0) 02/08/2011  . Mitral  regurgitation due to cusp prolapse 11/23/2010  . Cardiac tumor, ventricular 11/23/2010  . Mitral regurgitation 11/12/2010  . Essential hypertension, benign 11/12/2010    Past Surgical History:  Procedure Laterality Date  . ABDOMINAL HYSTERECTOMY    . BACK SURGERY    . CORONARY ARTERY BYPASS GRAFT  10/12/2011   Procedure: CORONARY ARTERY BYPASS GRAFTING (CABG);  Surgeon: Rexene Alberts, MD;  Location: [facility];  Service: Open Heart Surgery;  Laterality: N/A;  RESECTION OF LV MASS  . HEMORRHOID SURGERY    . HEMORROIDECTOMY    . HIP ARTHROPLASTY Right 04/17/2014   Procedure: ARTHROPLASTY BIPOLAR HIP;   Surgeon: Paralee Cancel, MD;  Location: [facility];  Service: Orthopedics;  Laterality: Right;  . LEFT AND RIGHT HEART CATHETERIZATION WITH CORONARY ANGIOGRAM N/A 09/29/2011   Procedure: LEFT AND RIGHT HEART CATHETERIZATION WITH CORONARY ANGIOGRAM;  Surgeon: Candee Furbish, MD;  Location: [facility] CATH LAB;  Service: Cardiovascular;  Laterality: N/A;  . leg fx repair     Right  . MITRAL VALVE REPAIR  10/12/2011   Procedure: MITRAL VALVE REPAIR (MVR);  Surgeon: Rexene Alberts, MD;  Location: [place];  Service: Open Heart Surgery;  Laterality: N/A;  MITRAL VALVE REPAIR  . PATENT FORAMEN OVALE CLOSURE  10/12/2011   Procedure: PATENT FORAMEN OVALE CLOSURE;  Surgeon: Rexene Alberts, MD;  Location: [place];  Service: Open Heart Surgery;  Laterality: N/A;  . SKIN CANCER EXCISION  2013   not melanoma  . TEE WITHOUT CARDIOVERSION  11/13/2010   Procedure: TRANSESOPHAGEAL ECHOCARDIOGRAM (TEE);  Surgeon: Candee Furbish, MD;  Location: [facility] ENDOSCOPY;  Service: Cardiovascular;  Laterality: N/A;  . TOTAL HIP ARTHROPLASTY Right      OB History   None      Home Medications    Prior to Admission medications   Medication Sig Start Date End Date Taking? Authorizing Provider  aspirin 81 MG tablet Take 81 mg by mouth daily.    [provider]  Cholecalciferol (VITAMIN D3) 2000 UNITS TABS Take 2,000 Units by mouth daily.     [provider]  Coenzyme Q10 (CO Q-10) 100 MG CAPS Take 100 mg by mouth daily.     [provider]  lisinopril (PRINIVIL,ZESTRIL) 20 MG tablet Take 1 tablet (20 mg total) by mouth daily. 10/29/15   Jerline Pain, MD  Memantine HCl (NAMENDA PO) Take 1 tablet by mouth daily.    [provider]  metoprolol tartrate (LOPRESSOR) 25 MG tablet Take 0.5 tablets (12.5 mg total) by mouth 2 (two) times daily. 10/29/15   Jerline Pain, MD  multivitamin-lutein (OCUVITE-LUTEIN) CAPS Take 2 capsules by mouth daily.     [provider]  Nutritional Supplements (ENSURE ENLIVE  PO) Take by mouth 2 (two) times daily.    [provider]  OVER THE COUNTER MEDICATION Take 1 tablet by mouth daily. Omega Red    [provider]  Rivastigmine 13.3 MG/24HR PT24 Place 13.3 mg onto the skin at bedtime.     [provider]  simvastatin (ZOCOR) 5 MG tablet TAKE 1 TABLET EACH DAY. 12/16/16   Jerline Pain, MD  vitamin E 400 UNIT capsule Take 400 Units by mouth daily.    [provider]    Family History Family History  Problem Relation Age of Onset  . Heart attack Mother   . Lung cancer Father     Social History Social History   Tobacco Use  . Smoking status: Former Smoker  Packs/day: 0.50    Years: 30.00    Pack years: 15.00    Types: Cigarettes    Last attempt to quit: 11/13/2010    Years since quitting: 6.7  . Smokeless tobacco: Never Used  Substance Use Topics  . Alcohol use: No  . Drug use: No     Allergies   Hydrocodone-acetaminophen; Penicillins; and Sulfa antibiotics   Review of Systems Review of Systems  Unable to perform ROS: Dementia  Cardiovascular: Positive for chest pain.     Physical Exam Updated Vital Signs BP (!) 192/90 (BP Location: Left Arm)   Pulse 82   Temp 97.8 F (36.6 C) (Oral)   Resp (!) 24   Ht 5\' 2"  (1.575 m)   Wt 44.5 kg   SpO2 96%   BMI 17.92 kg/m   Physical Exam 1025: Physical examination:  Nursing notes reviewed; Vital signs and O2 SAT reviewed;  Constitutional: Well developed, Well nourished, Well hydrated, In no acute distress; Head:  Normocephalic, atraumatic; Eyes: EOMI, PERRL, No scleral icterus; ENMT: Mouth and pharynx normal, Mucous membranes moist; Neck: Supple, Full range of motion, No lymphadenopathy; Cardiovascular: Regular rate and rhythm, No gallop; Respiratory: Breath sounds clear & equal bilaterally, No wheezes.  Speaking full sentences with ease, Normal respiratory effort/excursion; Chest: Nontender, Movement normal; Abdomen: Soft, Nontender, Nondistended, Normal  bowel sounds; Genitourinary: No CVA tenderness; Extremities: Peripheral pulses normal, No tenderness, No edema, No calf edema or asymmetry.; Neuro: Awake, alert, confused per hx dementia. No facial droop. Speech clear. Moves all extremities on stretcher spontaneously without apparent gross focal motor deficits.; Skin: Color normal, Warm, Dry.   ED Treatments / Results  Labs (all labs ordered are listed, but only abnormal results are displayed)   EKG EKG Interpretation  Date/Time:  Friday August 12 2017 10:08:34 EDT Ventricular Rate:  78 PR Interval:    QRS Duration: 96 QT Interval:  376 QTC Calculation: 429 R Axis:   77 Text Interpretation:  Sinus rhythm Probable left atrial enlargement Anterior infarct, old Baseline wander Artifact When compared with ECG of 05/21/2016 No significant change was found Confirmed by Francine Graven [phone]) on 08/12/2017 10:23:52 AM    EKG Interpretation  Date/Time:  Friday August 12 2017 14:59:18 EDT Ventricular Rate:  75 PR Interval:    QRS Duration: 92 QT Interval:  394 QTC Calculation: 441 R Axis:   71 Text Interpretation:  Sinus rhythm Probable left atrial enlargement Anterior infarct, old Baseline wander Artifact Since last tracing of earlier today No significant change was found Confirmed by Francine Graven [phone]) on 08/12/2017 3:27:15 PM          Radiology   Procedures Procedures (including critical care time)  Medications Ordered in ED Medications  nitroGLYCERIN (NITROSTAT) SL tablet 0.4 mg (0.4 mg Sublingual Given 08/12/17 1037)  morphine 2 MG/ML injection 2 mg (2 mg Intravenous Given 08/12/17 1037)  aspirin chewable tablet 324 mg (324 mg Oral Given 08/12/17 1037)     Initial Impression / Assessment and Plan / ED Course  I have reviewed the triage vital signs and the nursing notes.  Pertinent labs & imaging results that were available during my care of the patient were reviewed by me and considered in my medical decision making  (see chart for details).  MDM Reviewed: previous chart, nursing note and vitals Reviewed previous: labs and ECG Interpretation: labs, ECG and x-ray   Results for orders placed or performed during the hospital encounter of 54/00/86  Basic metabolic panel  Result Value  Ref Range   Sodium 144 135 - 145 mmol/L   Potassium 4.2 3.5 - 5.1 mmol/L   Chloride 106 98 - 111 mmol/L   CO2 27 22 - 32 mmol/L   Glucose, Bld 99 70 - 99 mg/dL   BUN 22 8 - 23 mg/dL   Creatinine, Ser 0.79 0.44 - 1.00 mg/dL   Calcium 9.8 8.9 - 10.3 mg/dL   GFR calc non Af Amer >60 >60 mL/min   GFR calc Af Amer >60 >60 mL/min   Anion gap 11 5 - 15  CBC  Result Value Ref Range   WBC 11.0 (H) 4.0 - 10.5 K/uL   RBC 4.68 3.87 - 5.11 MIL/uL   Hemoglobin 13.8 12.0 - 15.0 g/dL   HCT 42.8 36.0 - 46.0 %   MCV 91.5 78.0 - 100.0 fL   MCH 29.5 26.0 - 34.0 pg   MCHC 32.2 30.0 - 36.0 g/dL   RDW 15.3 11.5 - 15.5 %   Platelets 197 150 - 400 K/uL  I-stat troponin, ED  Result Value Ref Range   Troponin i, poc 0.00 0.00 - 0.08 ng/mL   Comment 3          I-Stat Troponin, ED (not at [facility])  Result Value Ref Range   Troponin i, poc 0.01 0.00 - 0.08 ng/mL   Comment 3           Dg Chest 2 View Result Date: 08/12/2017 CLINICAL DATA:  Increased weakness, chest pain EXAM: CHEST - 2 VIEW COMPARISON:  05/21/2016 FINDINGS: There is no focal consolidation. There is mild bilateral interstitial thickening. There is no pleural effusion or pneumothorax. There is stable cardiomegaly. There is evidence of prior CABG. There is a large hiatal hernia. The osseous structures are unremarkable. IMPRESSION: Cardiomegaly with mild pulmonary vascular congestion. Electronically Signed   By: Kathreen Devoid   On: 08/12/2017 11:30    1210: Pt improved after ASA and SL ntg. Heart score 5; will admit. Dx and testing d/w pt and family.  Questions answered.  Verb understanding, agreeable to admit.  T/C returned from [facility] Dr. Denton Brick, case discussed, including:  HPI,  pertinent PM/SHx, VS/PE, dx testing, ED course and treatment:  Agreeable to admit.   1250:  Pt and husband now do not want to stay. Risks AMA explained; husband verb understanding. [facility] MD has spoken with them: states they will compromise and stay until 1500 for repeat trop/EKG (they told [facility] MD that CP started at 0800). Repeat troponin and EKG ordered.   1530:  2nd EKG unchanged from previous today, troponin remains negative. Pt and husband still refuse to stay for admission; husband very concerned regarding wife becoming more agitated in the hospital. Aware risks of AMA. [facility] Dr. Denton Brick informed: he will re-evaluate pt and speak with family again.        Final Clinical Impressions(s) / ED Diagnoses   Final diagnoses:  None    ED Discharge Orders    None       Francine Graven, DO 08/13/17 0801

## 2017-08-12 NOTE — ED Notes (Signed)
Bed: WA05 Expected date:  Expected time:  Means of arrival:  Comments: 

## 2017-08-12 NOTE — Consult Note (Addendum)
Patient Demographics:    Victoria Sexton, is a 84 y.o. female  MRN: [id number]   DOB - 05/25/1927  Admit Date - 08/12/2017  Outpatient Primary MD for the patient is Harlan Stains, MD   Assessment & Plan:    Principal Problem:   Chest pain in adult Active Problems:   Essential hypertension, benign   Coronary artery disease   Congestive heart failure (HCC)   S/P CABG x 3   S/P MVR (mitral valve repair)   Hospitalist consult for possible admission to the hospital for chest pain requested by ED provider Dr. Thurnell Garbe  1) Chest Pain-history of CAD including previous CABG , serial troponins x 2 are neg  and EKG x 2 neg , tried to rule out acute coronary syndrome .c/n  isosorbide and metoprolol, Follow up with Dr Marlou Porch the cardiologist next week for recheck,  call or return if repeated chest pains or shortness of breath aspirin, nitroglycerin, and betablocker.  Despite persuasion patient, and husband and he needs at this time declined admission to the hospital for further ACS rule out or further cardiovascular work-up.  Patient's husband and patient's niece are concerned taht the patient has advanced dementia and she will get very agitated and likely become unmanageable if admitted to the hospital overnight.  Patient keeps repeating that she wants to go home patient's husband insist that he wants to take patient home, ED provider also tried unsuccessfully to convince patient and family to stay overnight    2)HTN-BP higher than baseline due to missed doses of a.m. BP medications and chest discomfort and anxiety about possible admission to the hospital, systolic blood pressure did come down to the 130s prior to discharge  3)Dementia-advanced with significant cognitive deficits, primary decision maker is patient's  husband.  4)Disposition- Despite persuasion to stay overnight for acute coronary syndrome worked up,  patient, her husband and niece refused. So as a compromise they agreed to stay in the ED for at least additional 7 hours from time of arrival to allow for repeat troponin and repeat EKG at least 6 to 7 hours apart . Fortunately repeat EKG and repeat troponin remain negative at this time patient, patient's husband, patient needs are requesting discharge home from the ED,  Case discussed with ED provider, see additional documentation from Dr. Elise Benne  the ED provider  patient is chest pain-free at the time of discharge  Hospitalist consult for possible admission to the hospital for chest pain requested by ED provider Dr. Thurnell Garbe  With History of - Reviewed by me  Past Medical History:  Diagnosis Date  . Anginal pain ([place])   . Aortic aneurysm ([place])   . Aortic atherosclerosis ([place]) 10/25/2012   Upper aortic atherosclerosis seen on CT scan 08/13/2002  . Cardiac tumor, ventricular    LVOT  . Congestive heart failure ([place]) 10/04/2011  . Coronary artery disease 10/04/2011  . Dementia   . Heart murmur   . Hernia,  hiatal    Left wears a hernia belt  . Hiatal hernia   . Hx of CABG    -LIMA to LAD,SVG to RCA, SVG to OM, mitral valve repair, patent foramen ovale closure, benign-apearing mass removal.  . Hypertension   . Inguinal hernia right  . Lumbar compression fracture ([place])   . Mild malnutrition ([place]) 10/25/2012  . Mitral regurgitation due to cusp prolapse   . MVP (mitral valve prolapse)   . Osteoporosis   . PVC (premature ventricular contraction)   . S/P CABG x 3 10/12/2011   LIMA to LAD, SVG to OM, SVG to RCA, EVH via left thigh and leg  . S/P mitral valve repair 10/12/2011   Complex valvuloplasty including triangular resection of flail posterior leaflet with artificial Goretex neocord placement x2 and 26 mm Sorin Memo 3D ring annuloplasty  . Shortness of breath   . Skin cancer 2013    Face.  . Thoracic compression fracture [facility])       Past Surgical History:  Procedure Laterality Date  . ABDOMINAL HYSTERECTOMY    . BACK SURGERY    . CORONARY ARTERY BYPASS GRAFT  10/12/2011   Procedure: CORONARY ARTERY BYPASS GRAFTING (CABG);  Surgeon: Rexene Alberts, MD;  Location: [place];  Service: Open Heart Surgery;  Laterality: N/A;  RESECTION OF LV MASS  . HEMORRHOID SURGERY    . HEMORROIDECTOMY    . HIP ARTHROPLASTY Right 04/17/2014   Procedure: ARTHROPLASTY BIPOLAR HIP;  Surgeon: Paralee Cancel, MD;  Location: [facility];  Service: Orthopedics;  Laterality: Right;  . LEFT AND RIGHT HEART CATHETERIZATION WITH CORONARY ANGIOGRAM N/A 09/29/2011   Procedure: LEFT AND RIGHT HEART CATHETERIZATION WITH CORONARY ANGIOGRAM;  Surgeon: Candee Furbish, MD;  Location[facility] CATH LAB;  Service: Cardiovascular;  Laterality: N/A;  . leg fx repair     Right  . MITRAL VALVE REPAIR  10/12/2011   Procedure: MITRAL VALVE REPAIR (MVR);  Surgeon: Rexene Alberts, MD;  Location: [place];  Service: Open Heart Surgery;  Laterality: N/A;  MITRAL VALVE REPAIR  . PATENT FORAMEN OVALE CLOSURE  10/12/2011   Procedure: PATENT FORAMEN OVALE CLOSURE;  Surgeon: Rexene Alberts, MD;  Location: [place];  Service: Open Heart Surgery;  Laterality: N/A;  . SKIN CANCER EXCISION  2013   not melanoma  . TEE WITHOUT CARDIOVERSION  11/13/2010   Procedure: TRANSESOPHAGEAL ECHOCARDIOGRAM (TEE);  Surgeon: Candee Furbish, MD;  Location: [facility] ENDOSCOPY;  Service: Cardiovascular;  Laterality: N/A;  . TOTAL HIP ARTHROPLASTY Right       Chief Complaint  Patient presents with  . Chest Pain      HPI:    Victoria Sexton  is a 84 y.o. female with past medical history relevant for coronary artery disease with previous three-vessel bypass by 5 years ago as well as history of prior mitral valve repair, hypertension and advanced dementia who presents with her husband in length with concerns of chest discomfort that started around 8 AM this morning.    Patient apparently was getting a sed rate stop when she started complaining of retrosternal chest discomfort, chest discomfort persisted even after patient arrived in the ED.  No leg pains no leg swelling no pleuritic symptoms patient did have some shortness of breath.  No diaphoresis no palpitations no syncope or near syncope  History is collaborated by patient's husband and patient's niece at bedside as patient is a poor historian due to advanced dementia  Despite persuasion to stay overnight for acute coronary  syndrome worked up,  patient, her husband and niece refused. So as a compromise they agreed to stay in the ED for at least additional 7 hours from time of arrival to allow for repeat troponin and repeat EKG at least 6 to 7 hours apart .  Fortunately repeat EKG and repeat troponin remain negative at this time patient, patient's husband, patient needs are requesting discharge home from the ED,  Case discussed with ED provider, see additional documentation from Dr. Elise Benne  the ED provider  Hospitalist consult for possible admission to the hospital for chest pain requested by ED provider Dr. Thurnell Garbe   Review of systems:    In addition to the HPI above,   A full Review of  Systems was done, all other systems reviewed are negative except as noted above in HPI , .    Social History:  Reviewed by me    Social History   Tobacco Use  . Smoking status: Former Smoker    Packs/day: 0.50    Years: 30.00    Pack years: 15.00    Types: Cigarettes    Last attempt to quit: 11/13/2010    Years since quitting: 6.7  . Smokeless tobacco: Never Used  Substance Use Topics  . Alcohol use: No       Family History :  Reviewed by me    Family History  Problem Relation Age of Onset  . Heart attack Mother   . Lung cancer Father      Home Medications:   Prior to Admission medications   Medication Sig Start Date End Date Taking? Authorizing Provider  aspirin 81 MG tablet Take 81  mg by mouth daily.   Yes [provider]  Cholecalciferol (VITAMIN D3) 2000 UNITS TABS Take 2,000 Units by mouth daily.    Yes [provider]  Coenzyme Q10 (CO Q-10) 100 MG CAPS Take 100 mg by mouth daily.    Yes [provider]  lisinopril (PRINIVIL,ZESTRIL) 20 MG tablet Take 1 tablet (20 mg total) by mouth daily. 10/29/15  Yes Jerline Pain, MD  Memantine HCl (NAMENDA PO) Take 1 tablet by mouth daily.   Yes [provider]  metoprolol tartrate (LOPRESSOR) 25 MG tablet Take 0.5 tablets (12.5 mg total) by mouth 2 (two) times daily. 10/29/15  Yes Jerline Pain, MD  multivitamin-lutein (OCUVITE-LUTEIN) CAPS Take 1 capsule by mouth daily.    Yes [provider]  Nutritional Supplements (ENSURE ENLIVE PO) Take 2 Bottles by mouth at bedtime.    Yes [provider]  OVER THE COUNTER MEDICATION Take 1 tablet by mouth daily. Omega Red   Yes [provider]  Rivastigmine 13.3 MG/24HR PT24 Place 13.3 mg onto the skin at bedtime.    Yes [provider]  simvastatin (ZOCOR) 5 MG tablet TAKE 1 TABLET EACH DAY. Patient taking differently: Take 5 mg by mouth at bedtime.  12/16/16  Yes Jerline Pain, MD  vitamin E 400 UNIT capsule Take 400 Units by mouth daily.   Yes [provider]  isosorbide mononitrate (IMDUR) 30 MG 24 hr tablet Take 1 tablet (30 mg total) by mouth daily. 08/12/17 08/12/18  Roxan Hockey, MD     Allergies:     Allergies  Allergen Reactions  . Hydrocodone-Acetaminophen Anxiety    Pt nervous and shaky per pt husband  . Penicillins Palpitations    Has patient had a PCN reaction causing immediate rash, facial/tongue/throat swelling, SOB or lightheadedness with hypotension: No Has  patient had a PCN reaction causing severe rash involving mucus membranes or skin necrosis: No Has patient had a PCN reaction that required hospitalization No Has patient had a PCN reaction occurring within the last 10 years:  No If all of the above answers are "NO", then may proceed with Cephalosporin use.   . Sulfa Antibiotics Rash     Physical Exam:   Vitals  Blood pressure 139/67, pulse 73, temperature 97.8 F (36.6 C), temperature source Oral, resp. rate (!) 25, height 5\' 2"  (1.575 m), weight 44.5 kg, SpO2 94 %.  Physical Examination: General appearance - alert, well appearing, and in no distress and  Mental status -confused and only oriented to self, apparently this is her baseline Eyes - sclera anicteric Neck - supple, no JVD elevation , Chest - clear  to auscultation bilaterally, symmetrical air movement,  Heart - S1 and S2 normal, CABG scar Abdomen - soft, nontender, nondistended, no masses or organomegaly Neurological -significant cognitive deficits consistent with at least moderate dementia, neck supple without rigidity, cranial nerves II through XII intact, DTR's normal and symmetric Extremities - no pedal edema noted, intact peripheral pulses  Skin - warm, dry     Data Review:    CBC Recent Labs  Lab 08/12/17 1030  WBC 11.0*  HGB 13.8  HCT 42.8  PLT 197  MCV 91.5  MCH 29.5  MCHC 32.2  RDW 15.3   ------------------------------------------------------------------------------------------------------------------  Chemistries  Recent Labs  Lab 08/12/17 1030  NA 144  K 4.2  CL 106  CO2 27  GLUCOSE 99  BUN 22  CREATININE 0.79  CALCIUM 9.8   ------------------------------------------------------------------------------------------------------------------ estimated creatinine clearance is 34.1 mL/min (by C-G formula based on SCr of 0.79 mg/dL). ------------------------------------------------------------------------------------------------------------------ No results for input(s): TSH, T4TOTAL, T3FREE, THYROIDAB in the last 72 hours.  Invalid input(s): FREET3   Coagulation profile No results for input(s): INR, PROTIME in the last 168  hours. ------------------------------------------------------------------------------------------------------------------- No results for input(s): DDIMER in the last 72 hours. -------------------------------------------------------------------------------------------------------------------  Cardiac Enzymes No results for input(s): CKMB, TROPONINI, MYOGLOBIN in the last 168 hours.  Invalid input(s): CK ------------------------------------------------------------------------------------------------------------------ No results found for: BNP   ---------------------------------------------------------------------------------------------------------------  Urinalysis    Component Value Date/Time   COLORURINE YELLOW 04/02/2015 1401   APPEARANCEUR CLOUDY (A) 04/02/2015 1401   LABSPEC 1.014 04/02/2015 1401   PHURINE 8.0 04/02/2015 1401   GLUCOSEU NEGATIVE 04/02/2015 1401   HGBUR NEGATIVE 04/02/2015 1401   BILIRUBINUR NEGATIVE 04/02/2015 1401   KETONESUR 15 (A) 04/02/2015 1401   PROTEINUR NEGATIVE 04/02/2015 1401   UROBILINOGEN 0.2 04/17/2014 0852   NITRITE NEGATIVE 04/02/2015 1401   LEUKOCYTESUR NEGATIVE 04/02/2015 1401    ----------------------------------------------------------------------------------------------------------------   Imaging Results:    Dg Chest 2 View  Result Date: 08/12/2017 CLINICAL DATA:  Increased weakness, chest pain EXAM: CHEST - 2 VIEW COMPARISON:  05/21/2016 FINDINGS: There is no focal consolidation. There is mild bilateral interstitial thickening. There is no pleural effusion or pneumothorax. There is stable cardiomegaly. There is evidence of prior CABG. There is a large hiatal hernia. The osseous structures are unremarkable. IMPRESSION: Cardiomegaly with mild pulmonary vascular congestion. Electronically Signed   By: Kathreen Devoid   On: 08/12/2017 11:30    Radiological Exams on Admission: Dg Chest 2 View  Result Date: 08/12/2017 CLINICAL DATA:   Increased weakness, chest pain EXAM: CHEST - 2 VIEW COMPARISON:  05/21/2016 FINDINGS: There is no focal consolidation. There is mild bilateral interstitial thickening. There is no pleural effusion or pneumothorax. There is stable cardiomegaly.  There is evidence of prior CABG. There is a large hiatal hernia. The osseous structures are unremarkable. IMPRESSION: Cardiomegaly with mild pulmonary vascular congestion. Electronically Signed   By: Kathreen Devoid   On: 08/12/2017 11:30    Family Communication: Admission, patients condition and plan of care including tests being ordered have been discussed with the patient and husband and Niece who indicate understanding and agree with the plan   Code Status - Full Code  Likely DC to  Home, Hospitalist consult for possible admission to the hospital for chest pain requested by ED provider Dr. Thurnell Garbe  Condition   Stable, patient is chest pain-free at the time of discharge  Roxan Hockey M.D on 08/12/2017 at 3:53 PM  Go to [email or web address] - password [email or web address] for contact info  [facility] - Office  [phone]

## 2017-08-12 NOTE — ED Triage Notes (Signed)
Pt c/o central chest pains that started about hour ago with SOB. Denies n/v/d or cough. PMH open heart surgery 5 years ago.

## 2017-08-12 NOTE — Discharge Instructions (Signed)
1)Take medications as prescribed including isosorbide and metoprolol for your heart 2)Follow up with Dr Marlou Porch your cardiologist next week for recheck 3) call or return if repeated chest pains or shortness of breath

## 2017-08-17 ENCOUNTER — Ambulatory Visit (INDEPENDENT_AMBULATORY_CARE_PROVIDER_SITE_OTHER): Payer: Medicare Other | Admitting: Cardiology

## 2017-08-17 ENCOUNTER — Encounter: Payer: Self-pay | Admitting: Cardiology

## 2017-08-17 VITALS — BP 148/70 | HR 81 | Ht 62.0 in | Wt 110.8 lb

## 2017-08-17 DIAGNOSIS — E441 Mild protein-calorie malnutrition: Secondary | ICD-10-CM | POA: Diagnosis not present

## 2017-08-17 DIAGNOSIS — I209 Angina pectoris, unspecified: Secondary | ICD-10-CM

## 2017-08-17 DIAGNOSIS — I2510 Atherosclerotic heart disease of native coronary artery without angina pectoris: Secondary | ICD-10-CM

## 2017-08-17 DIAGNOSIS — Z951 Presence of aortocoronary bypass graft: Secondary | ICD-10-CM

## 2017-08-17 DIAGNOSIS — Z9889 Other specified postprocedural states: Secondary | ICD-10-CM | POA: Diagnosis not present

## 2017-08-17 DIAGNOSIS — I2583 Coronary atherosclerosis due to lipid rich plaque: Secondary | ICD-10-CM

## 2017-08-17 MED ORDER — ISOSORBIDE MONONITRATE ER 30 MG PO TB24: 30.0000 mg | ORAL_TABLET | Freq: Every day | ORAL | 3 refills | Status: AC

## 2017-08-17 NOTE — Patient Instructions (Signed)
Medication Instructions:  Your physician recommends that you continue on your current medications as directed. Please refer to the Current Medication list given to you today.  Imdur: a refill was placed   Follow-Up: 4 month follow up with Dr. Marlou Porch.  If you need a refill on your cardiac medications before your next appointment, please call your pharmacy.

## 2017-08-17 NOTE — Progress Notes (Signed)
Cardiology Office Note    Date:  08/17/2017   ID:  Reilley, Valentine 04-12-1927, MRN [id number]  PCP:  Harlan Stains, MD  Cardiologist:   Candee Furbish, MD     History of Present Illness:  Victoria Sexton is a 84 y.o. female here for follow-up status post bypass surgery as well as mitral valve repair by Dr. Roxy Manns on 10/12/11.  - LIMA to LAD  - SVG to RCA  - SVG to obtuse marginal,  - with patent foramen ovale closure  - benign-appearing endocardial mass removal   She's had problems in the past with orthostatic hypotension especially after being diuresed. She's been experiencing some memory loss, falls.  She is maintaining her weight however I encouraged her to try to eat more protein, increased. She's had some problems with memory. She is here with her husband who is a retired Designer, multimedia from [employer].  Hip fx 2016. Overall she had one episode of shortness of breath with activity that he noticed. She downplays this. She has not had any since. This was short-lived duration of a few minutes. No chest pain. No syncope. Her husband, [name], tells me that they have updated the will and made funeral home arrangements.  08/17/2017-was in emergency department early August 2019 with chest pain, elevated blood pressure.  2 troponins were negative EKG was negative.  Did not wish to be admitted to the hospital.  Advancing agitation/memory impairment.   Past Medical History:  Diagnosis Date  . Anginal pain ([place])   . Aortic aneurysm ([place])   . Aortic atherosclerosis ([place]) 10/25/2012   Upper aortic atherosclerosis seen on CT scan 08/13/2002  . Cardiac tumor, ventricular    LVOT  . Congestive heart failure ([place]) 10/04/2011  . Coronary artery disease 10/04/2011  . Dementia   . Heart murmur   . Hernia, hiatal    Left wears a hernia belt  . Hiatal hernia   . Hx of CABG    -LIMA to LAD,SVG to RCA, SVG to OM, mitral valve repair, patent foramen ovale closure, benign-apearing mass removal.  . Hypertension   .  Inguinal hernia right  . Lumbar compression fracture ([place])   . Mild malnutrition ([place]) 10/25/2012  . Mitral regurgitation due to cusp prolapse   . MVP (mitral valve prolapse)   . Osteoporosis   . PVC (premature ventricular contraction)   . S/P CABG x 3 10/12/2011   LIMA to LAD, SVG to OM, SVG to RCA, EVH via left thigh and leg  . S/P mitral valve repair 10/12/2011   Complex valvuloplasty including triangular resection of flail posterior leaflet with artificial Goretex neocord placement x2 and 26 mm Sorin Memo 3D ring annuloplasty  . Shortness of breath   . Skin cancer 2013   Face.  . Thoracic compression fracture [facility])     Past Surgical History:  Procedure Laterality Date  . ABDOMINAL HYSTERECTOMY    . BACK SURGERY    . CORONARY ARTERY BYPASS GRAFT  10/12/2011   Procedure: CORONARY ARTERY BYPASS GRAFTING (CABG);  Surgeon: Rexene Alberts, MD;  Location: [facility];  Service: Open Heart Surgery;  Laterality: N/A;  RESECTION OF LV MASS  . HEMORRHOID SURGERY    . HEMORROIDECTOMY    . HIP ARTHROPLASTY Right 04/17/2014   Procedure: ARTHROPLASTY BIPOLAR HIP;  Surgeon: Paralee Cancel, MD;  Location: [facility];  Service: Orthopedics;  Laterality: Right;  . LEFT AND RIGHT HEART CATHETERIZATION WITH CORONARY ANGIOGRAM N/A 09/29/2011   Procedure:  LEFT AND RIGHT HEART CATHETERIZATION WITH CORONARY ANGIOGRAM;  Surgeon: Candee Furbish, MD;  Location[facility] C[facility] LAB;  Service: Cardiovascular;  Laterality: N/A;  . leg fx repair     Right  . MITRAL VALVE REPAIR  10/12/2011   Procedure: MITRAL VALVE REPAIR (MVR);  Surgeon: Rexene Alberts, MD;  Location: [place];  Service: Open Heart Surgery;  Laterality: N/A;  MITRAL VALVE REPAIR  . PATENT FORAMEN OVALE CLOSURE  10/12/2011   Procedure: PATENT FORAMEN OVALE CLOSURE;  Surgeon: Rexene Alberts, MD;  Location: [facility] [place];  Service: Open Heart Surgery;  Laterality: N/A;  . SKIN CANCER EXCISION  2013   not melanoma  . TEE WITHOUT CARDIOVERSION  11/13/2010   Procedure:  TRANSESOPHAGEAL ECHOCARDIOGRAM (TEE);  Surgeon: Candee Furbish, MD;  [facility] ENDOSCOPY;  Service: Cardiovascular;  Laterality: N/A;  . TOTAL HIP ARTHROPLASTY Right     Current Medications: Outpatient Medications Prior to Visit  Medication Sig Dispense Refill  . aspirin 81 MG tablet Take 81 mg by mouth daily.    . Cholecalciferol (VITAMIN D3) 2000 UNITS TABS Take 2,000 Units by mouth daily.     . Coenzyme Q10 (CO Q-10) 100 MG CAPS Take 100 mg by mouth daily.     [name] lisinopril (PRINIVIL,ZESTRIL) 20 MG tablet Take 1 tablet (20 mg total) by mouth daily. 90 tablet 3  . Memantine HCl (NAMENDA PO) Take 1 tablet by mouth daily.    . metoprolol tartrate (LOPRESSOR) 25 MG tablet Take 0.5 tablets (12.5 mg total) by mouth 2 (two) times daily. 90 tablet 3  . multivitamin-lutein (OCUVITE-LUTEIN) CAPS Take 1 capsule by mouth daily.     . Nutritional Supplements (ENSURE ENLIVE PO) Take 2 Bottles by mouth at bedtime.     [name] OVER THE COUNTER MEDICATION Take 1 tablet by mouth daily. Omega Red    . Rivastigmine 13.3 MG/24HR PT24 Place 13.3 mg onto the skin at bedtime.     . simvastatin (ZOCOR) 5 MG tablet TAKE 1 TABLET EACH DAY. (Patient taking differently: Take 5 mg by mouth at bedtime. ) 90 tablet 3  . vitamin E 400 UNIT capsule Take 400 Units by mouth daily.    . isosorbide mononitrate (IMDUR) 30 MG 24 hr tablet Take 1 tablet (30 mg total) by mouth daily. 30 tablet 2   No facility-administered medications prior to visit.      Allergies:   Hydrocodone-acetaminophen; Penicillins; and Sulfa antibiotics   Social History   Socioeconomic History  . Marital status: Married    Spouse name: [name]  . Number of children: 0  . Years of education: 29  . Highest education level: Not on file  Occupational History  . Occupation: retired    Comment: Financial controller  . Financial resource strain: Not on file  . Food insecurity:    Worry: Not on file    Inability: Not on file  . Transportation needs:     Medical: Not on file    Non-medical: Not on file  Tobacco Use  . Smoking status: Former Smoker    Packs/day: 0.50    Years: 30.00    Pack years: 15.00    Types: Cigarettes    Last attempt to quit: 11/13/2010    Years since quitting: 6.7  . Smokeless tobacco: Never Used  Substance and Sexual Activity  . Alcohol use: No  . Drug use: No  . Sexual activity: Not on file    Comment: did not ask  Lifestyle  .  Physical activity:    Days per week: Not on file    Minutes per session: Not on file  . Stress: Not on file  Relationships  . Social connections:    Talks on phone: Not on file    Gets together: Not on file    Attends religious service: Not on file    Active member of club or organization: Not on file    Attends meetings of clubs or organizations: Not on file    Relationship status: Not on file  Other Topics Concern  . Not on file  Social History Narrative   Lives at home with husband   Caffeine use - coffee 4-5 cups decaf daily     Family History:  The patient's family history includes Heart attack in her mother; Lung cancer in her father.   ROS:   Please see the history of present illness.    Review of Systems  All other systems reviewed and are negative.     PHYSICAL EXAM:   VS:  BP (!) 148/70   Pulse 81   Ht 5\' 2"  (1.575 m)   Wt 110 lb 12.8 oz (50.3 kg)   SpO2 94%   BMI 20.27 kg/m    GEN: Thin, in no acute distress  HEENT: normal  Neck: no JVD, + right carotid bruit, or masses Cardiac: RRR; 1/6 SM, no rubs, or gallops,no edema  Respiratory:  clear to auscultation bilaterally, normal work of breathing GI: soft, nontender, nondistended, + BS MS: no deformity or atrophy  Skin: warm and dry, no rash Neuro:  Alert and Oriented x 3, Strength and sensation are intact Psych: euthymic mood, full affect   Wt Readings from Last 3 Encounters:  08/17/17 110 lb 12.8 oz (50.3 kg)  08/12/17 98 lb (44.5 kg)  07/15/17 108 lb 12.8 oz (49.4 kg)      Studies/Labs  Reviewed:   EKG:  Today 11/23/16-sinus rhythm 75 with rare PVC, nonspecific ST-T wave changes personally viewed-prior 04/27/16-sinus rhythm 80 with no other significant abnormalities.  Recent Labs: 10/28/2016: ALT 10 08/12/2017: BUN 22; Creatinine, Ser 0.79; Hemoglobin 13.8; Platelets 197; Potassium 4.2; Sodium 144   Lipid Panel    Component Value Date/Time   CHOL 172 11/16/2013 0953   TRIG 83.0 11/16/2013 0953   HDL 53.00 11/16/2013 0953   CHOLHDL 3 11/16/2013 0953   VLDL 16.6 11/16/2013 0953   LDLCALC 102 (H) 11/16/2013 0953    Additional studies/ records that were reviewed today include:  Prior office notes, echocardiogram, EKGs reviewed, lab work reviewed  ECHO: 10/18/11  - Left ventricle: The cavity size was normal. There was severe concentric hypertrophy. The estimated ejection fraction was 55%. Wall motion was normal; there were no regional wall motion abnormalities. There was an increased relative contribution of atrial contraction to ventricular filling. Doppler parameters are consistent with abnormal left ventricular relaxation (grade 1 diastolic dysfunction). - Aortic valve: Moderate thickening and calcification, consistent with sclerosis. - Mitral valve: By mean gradient there appears to be moderate mitral stenosis but MVA by pressure half-time appears normal. Prior procedures included surgical repair. An annular ring prosthesis was present. Moderate thickening, with moderate involvement of chords. - Left atrium: The atrium was mild to moderately dilated.  Carotids 12/16/2016  Right Carotid: There is evidence in the right ICA of a 40-59% stenosis, by peak        systolic velocity and plaque.  Left Carotid: There is evidence in the left ICA of a 1-39% stenosis.  Non-hemodynamically significant plaque noted in the CCA. The ECA       appears >50% stenosed.  Vertebrals: Both vertebral arteries were patent with  antegrade flow. Subclavians: Normal flow hemodynamics were seen in bilateral subclavian       arteries.   Suggest follow up study in 12 months.  Candee Furbish, MD  ASSESSMENT:    1. Coronary artery disease due to lipid rich plaque   2. S/P CABG x 3   3. S/P mitral valve repair   4. Mild malnutrition ([place])   5. Angina pectoris ([place])      PLAN:  In order of problems listed above:  Chest pain/Angina  -Went to emergency department, chest pain.  Troponins x2-, EKG negative declined admission.  Worried about agitation in the hospital setting.  Isosorbide 30 mg was started.  She is not having any side effects.  Overall she has not had any further chest discomfort.  Continue.  I would try to avoid invasive procedures given her comorbidities currently.  Coronary artery disease  - Prior bypass surgery 3 in 2013, no anginal symptoms, doing well.  - No change in her heart murmur. Dental prophylaxis.  Overall doing well with isosorbide.  Episode of chest pain was severe enough to go to the emergency room in August 2019.  Mitral valve repair  - Soft murmur heard at apex. Dental prophylaxis.  Doing well.  Protein calorie malnutrition   - Encourage continue protein intake, she has had prior bone loss-fractures, no changes  Memory loss  -No significant advancement.  Her husband does answer several questions for her.  Carotid bruits/carotid artery disease - Dopplers reviewed as above.  Continue with secondary prevention.  85-month follow-up  Medication Adjustments/Labs and Tests Ordered: Current medicines are reviewed at length with the patient today.  Concerns regarding medicines are outlined above.  Medication changes, Labs and Tests ordered today are listed in the Patient Instructions below. Patient Instructions  Medication Instructions:  Your physician recommends that you continue on your current medications as directed. Please refer to the Current Medication list given to you  today.  Imdur: a refill was placed   Follow-Up: 4 month follow up with Dr. Marlou Porch.  If you need a refill on your cardiac medications before your next appointment, please call your pharmacy.      Signed, Candee Furbish, MD  08/17/2017 9:34 AM    [facility] [place], [place],   [address] Phone: [phone]; Fax: [phone]

## 2017-09-27 ENCOUNTER — Other Ambulatory Visit: Payer: Self-pay | Admitting: *Deleted

## 2017-09-27 DIAGNOSIS — I6523 Occlusion and stenosis of bilateral carotid arteries: Secondary | ICD-10-CM

## 2017-10-02 ENCOUNTER — Encounter (HOSPITAL_COMMUNITY): Payer: Self-pay

## 2017-10-02 ENCOUNTER — Other Ambulatory Visit: Payer: Self-pay

## 2017-10-02 ENCOUNTER — Emergency Department (HOSPITAL_COMMUNITY)
Admission: EM | Admit: 2017-10-02 | Discharge: 2017-10-02 | Disposition: A | Discharge status: Home / Self Care | Payer: Medicare Other | Attending: Emergency Medicine | Admitting: Emergency Medicine

## 2017-10-02 DIAGNOSIS — M545 Low back pain: Secondary | ICD-10-CM | POA: Diagnosis not present

## 2017-10-02 DIAGNOSIS — M25552 Pain in left hip: Secondary | ICD-10-CM | POA: Diagnosis present

## 2017-10-02 DIAGNOSIS — Z5321 Procedure and treatment not carried out due to patient leaving prior to being seen by health care provider: Secondary | ICD-10-CM | POA: Diagnosis not present

## 2017-10-02 NOTE — ED Triage Notes (Signed)
Patient c/o left hip pain and left lower back pain x 2 days asper patient's husband. Patient's husband reports that the patient has been having difficulty walking.

## 2017-12-15 ENCOUNTER — Encounter (HOSPITAL_COMMUNITY): Payer: Medicare Other

## 2017-12-15 ENCOUNTER — Ambulatory Visit (HOSPITAL_COMMUNITY)
Admission: RE | Admit: 2017-12-15 | Payer: Medicare Other | Source: Ambulatory Visit | Attending: Cardiology | Admitting: Cardiology

## 2017-12-19 ENCOUNTER — Inpatient Hospital Stay (HOSPITAL_COMMUNITY): Admission: RE | Admit: 2017-12-19 | Payer: Medicare Other | Source: Ambulatory Visit

## 2017-12-21 ENCOUNTER — Ambulatory Visit (HOSPITAL_COMMUNITY)
Admission: RE | Admit: 2017-12-21 | Discharge: 2017-12-21 | Disposition: A | Discharge status: Home / Self Care | Payer: Medicare Other | Source: Ambulatory Visit | Attending: Cardiovascular Disease | Admitting: Cardiovascular Disease

## 2017-12-21 DIAGNOSIS — I6523 Occlusion and stenosis of bilateral carotid arteries: Secondary | ICD-10-CM | POA: Insufficient documentation

## 2017-12-23 ENCOUNTER — Telehealth: Payer: Self-pay | Admitting: Cardiology

## 2017-12-23 NOTE — Telephone Encounter (Signed)
Attempted again to contact pt by phone to review the below results:  Summary:  Right Carotid: Velocities in the right ICA are consistent with a 1-39% stenosis.         Non-hemodynamically significant plaque <50% noted in the CCA. The         ECA appears <50% stenosed.    Left Carotid: Velocities in the left ICA are consistent with a 1-39% stenosis.        Non-hemodynamically significant plaque noted in the CCA. The ECA        appears <50% stenosed.    Vertebrals: Bilateral vertebral arteries demonstrate antegrade flow.  Subclavians: Normal flow hemodynamics were seen in bilateral subclavian        Arteries.   Mild disease. No need to repeat next year.  Candee Furbish, MD   Left message to c/b to discuss.

## 2017-12-23 NOTE — Telephone Encounter (Signed)
Patient returning your call.

## 2017-12-29 NOTE — Telephone Encounter (Signed)
Reviewed result with pt's husband who states understanding.

## 2019-01-28 ENCOUNTER — Emergency Department (HOSPITAL_COMMUNITY)
Admission: EM | Admit: 2019-01-28 | Discharge: 2019-01-28 | Disposition: A | Discharge status: Home / Self Care | Payer: Medicare Other | Attending: Emergency Medicine | Admitting: Emergency Medicine

## 2019-01-28 ENCOUNTER — Other Ambulatory Visit: Payer: Self-pay

## 2019-01-28 DIAGNOSIS — Z87891 Personal history of nicotine dependence: Secondary | ICD-10-CM | POA: Insufficient documentation

## 2019-01-28 DIAGNOSIS — N390 Urinary tract infection, site not specified: Secondary | ICD-10-CM | POA: Diagnosis not present

## 2019-01-28 DIAGNOSIS — Z79899 Other long term (current) drug therapy: Secondary | ICD-10-CM | POA: Insufficient documentation

## 2019-01-28 DIAGNOSIS — I2510 Atherosclerotic heart disease of native coronary artery without angina pectoris: Secondary | ICD-10-CM | POA: Diagnosis not present

## 2019-01-28 DIAGNOSIS — F0390 Unspecified dementia without behavioral disturbance: Secondary | ICD-10-CM | POA: Diagnosis not present

## 2019-01-28 DIAGNOSIS — I5032 Chronic diastolic (congestive) heart failure: Secondary | ICD-10-CM | POA: Insufficient documentation

## 2019-01-28 DIAGNOSIS — R195 Other fecal abnormalities: Secondary | ICD-10-CM | POA: Diagnosis not present

## 2019-01-28 DIAGNOSIS — I110 Hypertensive heart disease with heart failure: Secondary | ICD-10-CM | POA: Diagnosis not present

## 2019-01-28 DIAGNOSIS — Z7982 Long term (current) use of aspirin: Secondary | ICD-10-CM | POA: Diagnosis not present

## 2019-01-28 DIAGNOSIS — Z951 Presence of aortocoronary bypass graft: Secondary | ICD-10-CM | POA: Insufficient documentation

## 2019-01-28 LAB — CBC WITH DIFFERENTIAL/PLATELET
Abs Immature Granulocytes: 0.03 10*3/uL (ref 0.00–0.07)
Basophils Absolute: 0 10*3/uL (ref 0.0–0.1)
Basophils Relative: 0 %
Eosinophils Absolute: 0.1 10*3/uL (ref 0.0–0.5)
Eosinophils Relative: 1 %
HCT: 39.3 % (ref 36.0–46.0)
Hemoglobin: 12.9 g/dL (ref 12.0–15.0)
Immature Granulocytes: 0 %
Lymphocytes Relative: 77 %
Lymphs Abs: 9.8 10*3/uL — ABNORMAL HIGH (ref 0.7–4.0)
MCH: 30.8 pg (ref 26.0–34.0)
MCHC: 32.8 g/dL (ref 30.0–36.0)
MCV: 93.8 fL (ref 80.0–100.0)
Monocytes Absolute: 0.3 10*3/uL (ref 0.1–1.0)
Monocytes Relative: 2 %
Neutro Abs: 2.6 10*3/uL (ref 1.7–7.7)
Neutrophils Relative %: 20 %
Platelets: 146 10*3/uL — ABNORMAL LOW (ref 150–400)
RBC: 4.19 MIL/uL (ref 3.87–5.11)
RDW: 14.5 % (ref 11.5–15.5)
WBC: 12.9 10*3/uL — ABNORMAL HIGH (ref 4.0–10.5)
nRBC: 0 % (ref 0.0–0.2)

## 2019-01-28 LAB — COMPREHENSIVE METABOLIC PANEL
ALT: 10 U/L (ref 0–44)
AST: 23 U/L (ref 15–41)
Albumin: 3.8 g/dL (ref 3.5–5.0)
Alkaline Phosphatase: 72 U/L (ref 38–126)
Anion gap: 11 (ref 5–15)
BUN: 16 mg/dL (ref 8–23)
CO2: 25 mmol/L (ref 22–32)
Calcium: 9.5 mg/dL (ref 8.9–10.3)
Chloride: 106 mmol/L (ref 98–111)
Creatinine, Ser: 0.76 mg/dL (ref 0.44–1.00)
GFR calc Af Amer: >60 mL/min (ref >60)
GFR calc non Af Amer: >60 mL/min (ref >60)
Glucose, Bld: 92 mg/dL (ref 70–99)
Potassium: 3.7 mmol/L (ref 3.5–5.1)
Sodium: 142 mmol/L (ref 135–145)
Total Bilirubin: 0.5 mg/dL (ref 0.3–1.2)
Total Protein: 6.7 g/dL (ref 6.5–8.1)

## 2019-01-28 LAB — URINALYSIS, ROUTINE W REFLEX MICROSCOPIC
Bilirubin Urine: NEGATIVE
Glucose, UA: NEGATIVE mg/dL
Hgb urine dipstick: NEGATIVE
Ketones, ur: NEGATIVE mg/dL
Nitrite: NEGATIVE
Protein, ur: NEGATIVE mg/dL
Specific Gravity, Urine: 1.009 (ref 1.005–1.030)
pH: 8 (ref 5.0–8.0)

## 2019-01-28 LAB — POC OCCULT BLOOD, ED: Fecal Occult Bld: NEGATIVE

## 2019-01-28 MED ORDER — CEPHALEXIN 500 MG PO CAPS: 500.0000 mg | ORAL_CAPSULE | Freq: Four times a day (QID) | ORAL | 0 refills | Status: DC

## 2019-01-28 NOTE — Discharge Instructions (Signed)
Your urine shows some evidence of infection.  Take antibiotic as prescribed. Follow up with your doctor for further management of your health.

## 2019-01-28 NOTE — ED Notes (Signed)
Husband at bedside.  

## 2019-01-28 NOTE — ED Triage Notes (Signed)
Pt BIBA from home.   Per EMS- Husband reports dark stools x3 days.   Pt with hx of dementia. No emesis, no hematuria. Pt without c/o pain.   Pt has not taken any AM meds today.

## 2019-01-28 NOTE — ED Notes (Signed)
Discharge paperwork reviewed with husband and pt.  Husband verbalized understanding, had no questions or concerns at this time.

## 2019-01-28 NOTE — ED Provider Notes (Signed)
[facility] DEPT Provider Note   CSN: [id number] Arrival date & time: 01/28/19  0907     History Chief Complaint  Patient presents with  . Hematochezia    Victoria Sexton is a 86 y.o. female.  The history is provided by the patient and the spouse. No language interpreter was used.     86 year old female with history of valvular disease, CAD, CHF, dementia brought here via EMS accompanied by husband for evaluation of black tarry stool.  Per husband, for the past 3 days patient has had dark black stools.  Furthermore, for the past several weeks she has been eating less, appears to be more confused and baseline, not always taking her medication, and this morning after using the bathroom, she was complaining about not feeling well.  She is not on any blood thinner medication.  She has not had black tarry stools in the past.  No report of any vomiting or bloody urine.  She did not take her morning medications.  The remainder of history is limited due to patient's underlying dementia.  Past Medical History:  Diagnosis Date  . Anginal pain ([place])   . Aortic aneurysm ([place])   . Aortic atherosclerosis ([place]) 10/25/2012   Upper aortic atherosclerosis seen on CT scan 08/13/2002  . Cardiac tumor, ventricular    LVOT  . Congestive heart failure ([place]) 10/04/2011  . Coronary artery disease 10/04/2011  . Dementia   . Heart murmur   . Hernia, hiatal    Left wears a hernia belt  . Hiatal hernia   . Hx of CABG    -LIMA to LAD,SVG to RCA, SVG to OM, mitral valve repair, patent foramen ovale closure, benign-apearing mass removal.  . Hypertension   . Inguinal hernia right  . Lumbar compression fracture ([place])   . Mild malnutrition ([place]) 10/25/2012  . Mitral regurgitation due to cusp prolapse   . MVP (mitral valve prolapse)   . Osteoporosis   . PVC (premature ventricular contraction)   . S/P CABG x 3 10/12/2011   LIMA to LAD, SVG to OM, SVG to RCA, EVH via left thigh and leg   . S/P mitral valve repair 10/12/2011   Complex valvuloplasty including triangular resection of flail posterior leaflet with artificial Goretex neocord placement x2 and 26 mm Sorin Memo 3D ring annuloplasty  . Shortness of breath   . Skin cancer 2013   Face.  . Thoracic compression fracture [facility])     Patient Active Problem List   Diagnosis Date Noted  . Chest pain in adult 08/12/2017  . Protein-calorie malnutrition, severe 12/20/2014  . Lumbar compression fracture ([place])   . Bradycardia 12/19/2014  . Intractable low back pain 12/19/2014  . Pancreatitis 12/19/2014  . Compression fracture 12/19/2014  . Hypokalemia 12/19/2014  . Lung nodule 12/19/2014  . Aortic aneurysm ([place]) 12/19/2014  . Mild dementia ([place]) 08/20/2014  . Preop cardiovascular exam 04/17/2014  . H/O mitral valve repair 04/17/2014  . Fracture of femoral neck, right ([place]) 04/16/2014  . Femoral neck fracture ([place]) 04/16/2014  . Hypoxia 12/29/2013  . Acute diastolic CHF (congestive heart failure) ([place]) 12/29/2013  . SOB (shortness of breath)   . HLD (hyperlipidemia)   . Aortic atherosclerosis ([place]) 10/25/2012  . Mild malnutrition ([place]) 10/25/2012  . S/P MVR (mitral valve repair) 01/17/2012  . S/P mitral valve repair 10/12/2011  . S/P CABG x 3 10/12/2011  . Coronary artery disease 10/04/2011  . Congestive heart failure ([place]) 10/04/2011  .  Shortness of breath 09/28/2011  . Right inguinal hernia 09/01/2011  . Mitral valve disorders(424.0) 02/08/2011  . Mitral regurgitation due to cusp prolapse 11/23/2010  . Cardiac tumor, ventricular 11/23/2010  . Mitral regurgitation 11/12/2010  . Essential hypertension, benign 11/12/2010    Past Surgical History:  Procedure Laterality Date  . ABDOMINAL HYSTERECTOMY    . BACK SURGERY    . CORONARY ARTERY BYPASS GRAFT  10/12/2011   Procedure: CORONARY ARTERY BYPASS GRAFTING (CABG);  Surgeon: Rexene Alberts, MD;  Location: [facility];  Service: Open Heart Surgery;  Laterality: N/A;   RESECTION OF LV MASS  . HEMORRHOID SURGERY    . HEMORROIDECTOMY    . HIP ARTHROPLASTY Right 04/17/2014   Procedure: ARTHROPLASTY BIPOLAR HIP;  Surgeon: Paralee Cancel, MD;  Location: [facility];  Service: Orthopedics;  Laterality: Right;  . LEFT AND RIGHT HEART CATHETERIZATION WITH CORONARY ANGIOGRAM N/A 09/29/2011   Procedure: LEFT AND RIGHT HEART CATHETERIZATION WITH CORONARY ANGIOGRAM;  Surgeon: Candee Furbish, MD;  Location: [facility] CATH LAB;  Service: Cardiovascular;  Laterality: N/A;  . leg fx repair     Right  . MITRAL VALVE REPAIR  10/12/2011   Procedure: MITRAL VALVE REPAIR (MVR);  Surgeon: Rexene Alberts, MD;  Location: [facility];  Service: Open Heart Surgery;  Laterality: N/A;  MITRAL VALVE REPAIR  . PATENT FORAMEN OVALE CLOSURE  10/12/2011   Procedure: PATENT FORAMEN OVALE CLOSURE;  Surgeon: Rexene Alberts, MD;  Location: [facility];  Service: Open Heart Surgery;  Laterality: N/A;  . SKIN CANCER EXCISION  2013   not melanoma  . TEE WITHOUT CARDIOVERSION  11/13/2010   Procedure: TRANSESOPHAGEAL ECHOCARDIOGRAM (TEE);  Surgeon: Candee Furbish, MD;  Location: [facility] ENDOSCOPY;  Service: Cardiovascular;  Laterality: N/A;  . TOTAL HIP ARTHROPLASTY Right      OB History   No obstetric history on file.     Family History  Problem Relation Age of Onset  . Heart attack Mother   . Lung cancer Father     Social History   Tobacco Use  . Smoking status: Former Smoker    Packs/day: 0.50    Years: 30.00    Pack years: 15.00    Types: Cigarettes    Quit date: 11/13/2010    Years since quitting: 8.2  . Smokeless tobacco: Never Used  Substance Use Topics  . Alcohol use: No  . Drug use: No    Home Medications Prior to Admission medications   Medication Sig Start Date End Date Taking? Authorizing Provider  aspirin 81 MG tablet Take 81 mg by mouth daily.    [provider]  Cholecalciferol (VITAMIN D3) 2000 UNITS TABS Take 2,000 Units by mouth daily.     [provider]  Coenzyme Q10 (CO  Q-10) 100 MG CAPS Take 100 mg by mouth daily.     [provider]  isosorbide mononitrate (IMDUR) 30 MG 24 hr tablet Take 1 tablet (30 mg total) by mouth daily. 08/17/17 08/17/18  Jerline Pain, MD  lisinopril (PRINIVIL,ZESTRIL) 20 MG tablet Take 1 tablet (20 mg total) by mouth daily. 10/29/15   Jerline Pain, MD  Memantine HCl (NAMENDA PO) Take 1 tablet by mouth daily.    [provider]  metoprolol tartrate (LOPRESSOR) 25 MG tablet Take 0.5 tablets (12.5 mg total) by mouth 2 (two) times daily. 10/29/15   Jerline Pain, MD  multivitamin-lutein (OCUVITE-LUTEIN) CAPS Take 1 capsule by mouth daily.     [provider]  Nutritional  Supplements (ENSURE ENLIVE PO) Take 2 Bottles by mouth at bedtime.     [provider]  OVER THE COUNTER MEDICATION Take 1 tablet by mouth daily. Omega Red    [provider]  Rivastigmine 13.3 MG/24HR PT24 Place 13.3 mg onto the skin at bedtime.     [provider]  simvastatin (ZOCOR) 5 MG tablet TAKE 1 TABLET EACH DAY. Patient taking differently: Take 5 mg by mouth at bedtime.  12/16/16   Jerline Pain, MD  vitamin E 400 UNIT capsule Take 400 Units by mouth daily.    [provider]    Allergies    Hydrocodone-acetaminophen, Penicillins, and Sulfa antibiotics  Review of Systems   Review of Systems  Unable to perform ROS: Dementia    Physical Exam Updated Vital Signs Pulse 67   Temp 98.4 F (36.9 C) (Oral)   Resp (!) 26   SpO2 97%   Physical Exam Vitals and nursing note reviewed.  Constitutional:      General: She is not in acute distress.    Appearance: She is well-developed.     Comments:  in no acute distress.  HENT:     Head: Atraumatic.  Eyes:     Extraocular Movements: Extraocular movements intact.     Conjunctiva/sclera: Conjunctivae normal.     Pupils: Pupils are equal, round, and reactive to light.  Cardiovascular:     Rate and Rhythm: Normal rate and regular rhythm.      Pulses: Normal pulses.     Heart sounds: Normal heart sounds.  Pulmonary:     Effort: Pulmonary effort is normal.     Breath sounds: Normal breath sounds.  Abdominal:     Palpations: Abdomen is soft.     Tenderness: There is no abdominal tenderness.  Genitourinary:    Comments: Normal rectal tone, melanotic stool on glove, no obvious mass. No bright red blood. Musculoskeletal:     Cervical back: Neck supple.     Comments: Able to move all 4 extremities.  Skin:    Coloration: Skin is not pale.     Findings: No rash.  Neurological:     Mental Status: She is alert.     GCS: GCS eye subscore is 4. GCS verbal subscore is 5. GCS motor subscore is 6.     Comments: Alert to self only, unable to recall month, year, or current president.  Unable to recall current situation.  Psychiatric:        Mood and Affect: Mood normal.     ED Results / Procedures / Treatments   Labs (all labs ordered are listed, but only abnormal results are displayed) Labs Reviewed  CBC WITH DIFFERENTIAL/PLATELET - Abnormal; Notable for the following components:      Result Value   WBC 12.9 (*)    Platelets 146 (*)    Lymphs Abs 9.8 (*)    All other components within normal limits  URINALYSIS, ROUTINE W REFLEX MICROSCOPIC - Abnormal; Notable for the following components:   APPearance HAZY (*)    Leukocytes,Ua TRACE (*)    Bacteria, UA RARE (*)    All other components within normal limits  URINE CULTURE  COMPREHENSIVE METABOLIC PANEL  PATHOLOGIST SMEAR REVIEW  POC OCCULT BLOOD, ED  TYPE AND SCREEN    EKG None  Radiology No results found.  Procedures Procedures (including critical care time)  Medications Ordered in ED Medications - No data to display  ED Course  I have reviewed the  triage vital signs and the nursing notes.  Pertinent labs & imaging results that were available during my care of the patient were reviewed by me and considered in my medical decision making (see chart for  details).  Clinical Course as of Jan 27 945  Sun Jan 27, 4753  1166 86 year old female history of dementia sent in by EMS from home for evaluation of dark bowel movements for 3 days.  Patient herself denies any complaints and is mad that she was sent here from home.  Blood pressure elevated otherwise nontoxic-appearing.  Getting labs, stool guaiac, and reaching out to husband for further information.   [MB]    Clinical Course User Index [MB] Hayden Rasmussen, MD   MDM Rules/Calculators/A&P                      BP (!) 144/78   Pulse 71   Temp 98.4 F (36.9 C) (Oral)   Resp (!) 27   SpO2 100%   Final Clinical Impression(s) / ED Diagnoses Final diagnoses:  Dark stools  Urinary tract infection without hematuria, site unspecified    Rx / DC Orders ED Discharge Orders         Ordered    cephALEXin (KEFLEX) 500 MG capsule  4 times daily     01/28/19 1246         10:02 AM Patient with underlying dementia here with report of black tarry stool for the past 3 days.  No other complaint.  Abdomen is soft nontender, vital signs stable, stool is melanotic on exam.  Fecal occult blood test is negative.  Pt ambulate without difficulty.  UA with some evidence to suggest UTI.  Given age and report of increase urinary frequency, will treat with Keflex.  Otherwise she is stable for discharge.  No evidence of GI bleed. Care discussed with DR. Butler.  No evidence of covid-19 infection.    Domenic Moras, PA-C 01/28/19 1249    Hayden Rasmussen, MD 01/28/19 [id number]

## 2019-01-28 NOTE — ED Notes (Signed)
Pt ambulated self to restroom with supervision, pt denied any complaints, pt maintained steady gait.

## 2019-01-29 LAB — PATHOLOGIST SMEAR REVIEW

## 2019-01-29 LAB — URINE CULTURE: Culture: <10000 — AB

## 2019-02-22 ENCOUNTER — Ambulatory Visit: Payer: Medicare Other | Admitting: Podiatry

## 2019-02-27 ENCOUNTER — Encounter: Payer: Self-pay | Admitting: Podiatry

## 2019-02-27 ENCOUNTER — Ambulatory Visit (INDEPENDENT_AMBULATORY_CARE_PROVIDER_SITE_OTHER): Payer: Medicare Other | Admitting: Podiatry

## 2019-02-27 ENCOUNTER — Other Ambulatory Visit: Payer: Self-pay

## 2019-02-27 VITALS — BP 114/64 | HR 78 | Resp 16

## 2019-02-27 DIAGNOSIS — M79676 Pain in unspecified toe(s): Secondary | ICD-10-CM | POA: Diagnosis not present

## 2019-02-27 DIAGNOSIS — B351 Tinea unguium: Secondary | ICD-10-CM

## 2019-02-28 NOTE — Progress Notes (Signed)
Subjective:  Patient ID: Victoria Sexton, female    DOB: 10-Apr-1927,  MRN: [id number] HPI Chief Complaint  Patient presents with  . Nail Problem    Toenails - long, thick, hurting around toes, can't trim herself  . New Patient (Initial Visit)    86 y.o. female presents with the above complaint.   ROS: Denies fever chills nausea vomiting muscle aches pains calf pain back pain chest pain shortness of breath.  Past Medical History:  Diagnosis Date  . Anginal pain ([place])   . Aortic aneurysm ([place])   . Aortic atherosclerosis ([place]) 10/25/2012   Upper aortic atherosclerosis seen on CT scan 08/13/2002  . Cardiac tumor, ventricular    LVOT  . Congestive heart failure ([place]) 10/04/2011  . Coronary artery disease 10/04/2011  . Dementia ([place])   . Heart murmur   . Hernia, hiatal    Left wears a hernia belt  . Hiatal hernia   . Hx of CABG    -LIMA to LAD,SVG to RCA, SVG to OM, mitral valve repair, patent foramen ovale closure, benign-apearing mass removal.  . Hypertension   . Inguinal hernia right  . Lumbar compression fracture ([place])   . Mild malnutrition ([place]) 10/25/2012  . Mitral regurgitation due to cusp prolapse   . MVP (mitral valve prolapse)   . Osteoporosis   . PVC (premature ventricular contraction)   . S/P CABG x 3 10/12/2011   LIMA to LAD, SVG to OM, SVG to RCA, EVH via left thigh and leg  . S/P mitral valve repair 10/12/2011   Complex valvuloplasty including triangular resection of flail posterior leaflet with artificial Goretex neocord placement x2 and 26 mm Sorin Memo 3D ring annuloplasty  . Shortness of breath   . Skin cancer 2013   Face.  . Thoracic compression fracture [facility])    Past Surgical History:  Procedure Laterality Date  . ABDOMINAL HYSTERECTOMY    . BACK SURGERY    . CORONARY ARTERY BYPASS GRAFT  10/12/2011   Procedure: CORONARY ARTERY BYPASS GRAFTING (CABG);  Surgeon: Rexene Alberts, MD;  Location: [facility];  Service: Open Heart Surgery;  Laterality: N/A;  RESECTION  OF LV MASS  . HEMORRHOID SURGERY    . HEMORROIDECTOMY    . HIP ARTHROPLASTY Right 04/17/2014   Procedure: ARTHROPLASTY BIPOLAR HIP;  Surgeon: Paralee Cancel, MD;  [facility];  Service: Orthopedics;  Laterality: Right;  . LEFT AND RIGHT HEART CATHETERIZATION WITH CORONARY ANGIOGRAM N/A 09/29/2011   Procedure: LEFT AND RIGHT HEART CATHETERIZATION WITH CORONARY ANGIOGRAM;  Surgeon: Candee Furbish, MD;  Location: [facility] CATH LAB;  Service: Cardiovascular;  Laterality: N/A;  . leg fx repair     Right  . MITRAL VALVE REPAIR  10/12/2011   Procedure: MITRAL VALVE REPAIR (MVR);  Surgeon: Rexene Alberts, MD;  Location: [facility];  Service: Open Heart Surgery;  Laterality: N/A;  MITRAL VALVE REPAIR  . PATENT FORAMEN OVALE CLOSURE  10/12/2011   Procedure: PATENT FORAMEN OVALE CLOSURE;  Surgeon: Rexene Alberts, MD;  Location: [facility];  Service: Open Heart Surgery;  Laterality: N/A;  . SKIN CANCER EXCISION  2013   not melanoma  . TEE WITHOUT CARDIOVERSION  11/13/2010   Procedure: TRANSESOPHAGEAL ECHOCARDIOGRAM (TEE);  Surgeon: Candee Furbish, MD;  Location: [facility] ENDOSCOPY;  Service: Cardiovascular;  Laterality: N/A;  . TOTAL HIP ARTHROPLASTY Right     Current Outpatient Medications:  .  aspirin 81 MG tablet, Take 81 mg by mouth daily., Disp: , Rfl:  .  Cholecalciferol (VITAMIN D3) 2000 UNITS TABS, Take 2,000 Units by mouth daily. , Disp: , Rfl:  .  Coenzyme Q10 (CO Q-10) 100 MG CAPS, Take 100 mg by mouth daily. , Disp: , Rfl:  .  isosorbide mononitrate (IMDUR) 30 MG 24 hr tablet, Take 1 tablet (30 mg total) by mouth daily., Disp: 90 tablet, Rfl: 3 .  lisinopril (PRINIVIL,ZESTRIL) 20 MG tablet, Take 1 tablet (20 mg total) by mouth daily., Disp: 90 tablet, Rfl: 3 .  metoprolol succinate (TOPROL-XL) 25 MG 24 hr tablet, Take 25 mg by mouth daily., Disp: , Rfl:  .  metoprolol tartrate (LOPRESSOR) 25 MG tablet, Take 0.5 tablets (12.5 mg total) by mouth 2 (two) times daily., Disp: 90 tablet, Rfl: 3 .  mirtazapine (REMERON) 15  MG tablet, Take 15 mg by mouth at bedtime., Disp: , Rfl:  .  multivitamin-lutein (OCUVITE-LUTEIN) CAPS, Take 1 capsule by mouth daily. , Disp: , Rfl:  .  NAMENDA XR 28 MG CP24 24 hr capsule, Take 28 mg by mouth daily., Disp: , Rfl:  .  Nutritional Supplements (ENSURE ENLIVE PO), Take 2 Bottles by mouth at bedtime. , Disp: , Rfl:  .  OVER THE COUNTER MEDICATION, Take 1 tablet by mouth daily. Omega Red, Disp: , Rfl:  .  Rivastigmine 13.3 MG/24HR PT24, Place 13.3 mg onto the skin at bedtime. , Disp: , Rfl:  .  simvastatin (ZOCOR) 5 MG tablet, TAKE 1 TABLET EACH DAY. (Patient taking differently: Take 5 mg by mouth at bedtime. ), Disp: 90 tablet, Rfl: 3 .  vitamin E 400 UNIT capsule, Take 400 Units by mouth daily., Disp: , Rfl:   Allergies  Allergen Reactions  . Hydrocodone-Acetaminophen Anxiety    Pt nervous and shaky per pt husband  . Penicillins Palpitations    Has patient had a PCN reaction causing immediate rash, facial/tongue/throat swelling, SOB or lightheadedness with hypotension: No Has patient had a PCN reaction causing severe rash involving mucus membranes or skin necrosis: No Has patient had a PCN reaction that required hospitalization No Has patient had a PCN reaction occurring within the last 10 years: No If all of the above answers are "NO", then may proceed with Cephalosporin use.   . Sulfa Antibiotics Rash   Review of Systems Objective:   Vitals:   02/27/19 1411  BP: 114/64  Pulse: 78  Resp: 16    General: Well developed, nourished, in no acute distress, alert and oriented x3   Dermatological: Skin is warm, dry and supple bilateral. Nails x 10 are thick yellow dystrophic clinically mycotic severely elongated and painful on palpation and debridement.  Remaining integument appears unremarkable at this time. There are no open sores, no preulcerative lesions, no rash or signs of infection present.  Vascular: Dorsalis Pedis artery and Posterior Tibial artery pedal pulses  are 2/4 bilateral with immedate capillary fill time. Pedal hair growth present. No varicosities and no lower extremity edema present bilateral.   Neruologic: Grossly intact via light touch bilateral. Vibratory intact via tuning fork bilateral. Protective threshold with Semmes Wienstein monofilament intact to all pedal sites bilateral. Patellar and Achilles deep tendon reflexes 2+ bilateral. No Babinski or clonus noted bilateral.   Musculoskeletal: No gross boney pedal deformities bilateral. No pain, crepitus, or limitation noted with foot and ankle range of motion bilateral. Muscular strength 5/5 in all groups tested bilateral.  Gait: Unassisted, Nonantalgic.    Radiographs:  None taken  Assessment & Plan:   Assessment: Pain in limb secondary to  onychomycosis  Plan: Debridement of toenails 1 through 5 bilateral.     Victoria Sexton T. [place], [place]

## 2019-03-23 ENCOUNTER — Emergency Department (HOSPITAL_COMMUNITY)
Admission: EM | Admit: 2019-03-23 | Discharge: 2019-04-05 | Disposition: E | Payer: Medicare Other | Attending: Emergency Medicine | Admitting: Emergency Medicine

## 2019-03-23 DIAGNOSIS — Z951 Presence of aortocoronary bypass graft: Secondary | ICD-10-CM | POA: Insufficient documentation

## 2019-03-23 DIAGNOSIS — F0390 Unspecified dementia without behavioral disturbance: Secondary | ICD-10-CM | POA: Diagnosis not present

## 2019-03-23 DIAGNOSIS — I5030 Unspecified diastolic (congestive) heart failure: Secondary | ICD-10-CM | POA: Diagnosis not present

## 2019-03-23 DIAGNOSIS — Z79899 Other long term (current) drug therapy: Secondary | ICD-10-CM | POA: Diagnosis not present

## 2019-03-23 DIAGNOSIS — R6521 Severe sepsis with septic shock: Secondary | ICD-10-CM | POA: Insufficient documentation

## 2019-03-23 DIAGNOSIS — H518 Other specified disorders of binocular movement: Secondary | ICD-10-CM | POA: Diagnosis present

## 2019-03-23 DIAGNOSIS — Z87891 Personal history of nicotine dependence: Secondary | ICD-10-CM | POA: Diagnosis not present

## 2019-03-23 DIAGNOSIS — Z7982 Long term (current) use of aspirin: Secondary | ICD-10-CM | POA: Insufficient documentation

## 2019-03-23 DIAGNOSIS — I110 Hypertensive heart disease with heart failure: Secondary | ICD-10-CM | POA: Insufficient documentation

## 2019-03-23 DIAGNOSIS — A419 Sepsis, unspecified organism: Secondary | ICD-10-CM | POA: Diagnosis not present

## 2019-03-23 DIAGNOSIS — G9341 Metabolic encephalopathy: Secondary | ICD-10-CM | POA: Diagnosis not present

## 2019-03-23 DIAGNOSIS — Z85828 Personal history of other malignant neoplasm of skin: Secondary | ICD-10-CM | POA: Diagnosis not present

## 2019-03-23 DIAGNOSIS — I2510 Atherosclerotic heart disease of native coronary artery without angina pectoris: Secondary | ICD-10-CM | POA: Diagnosis not present

## 2019-03-23 DIAGNOSIS — Z20822 Contact with and (suspected) exposure to covid-19: Secondary | ICD-10-CM | POA: Insufficient documentation

## 2019-03-23 DIAGNOSIS — Z96641 Presence of right artificial hip joint: Secondary | ICD-10-CM | POA: Insufficient documentation

## 2019-03-24 ENCOUNTER — Encounter (HOSPITAL_COMMUNITY): Payer: Self-pay

## 2019-03-24 ENCOUNTER — Emergency Department (HOSPITAL_COMMUNITY): Payer: Medicare Other

## 2019-03-24 ENCOUNTER — Other Ambulatory Visit: Payer: Self-pay

## 2019-03-24 DIAGNOSIS — A419 Sepsis, unspecified organism: Secondary | ICD-10-CM

## 2019-03-24 DIAGNOSIS — R6521 Severe sepsis with septic shock: Secondary | ICD-10-CM

## 2019-03-24 DIAGNOSIS — G9341 Metabolic encephalopathy: Secondary | ICD-10-CM

## 2019-03-24 LAB — URINALYSIS, ROUTINE W REFLEX MICROSCOPIC
Bilirubin Urine: NEGATIVE
Glucose, UA: NEGATIVE mg/dL
Hgb urine dipstick: NEGATIVE
Ketones, ur: NEGATIVE mg/dL
Nitrite: NEGATIVE
Protein, ur: 30 mg/dL — AB
Specific Gravity, Urine: 1.02 (ref 1.005–1.030)
pH: 6 (ref 5.0–8.0)

## 2019-03-24 LAB — CBG MONITORING, ED
Glucose-Capillary: 201 mg/dL — ABNORMAL HIGH (ref 70–99)
Glucose-Capillary: 263 mg/dL — ABNORMAL HIGH (ref 70–99)

## 2019-03-24 LAB — I-STAT CHEM 8, ED
BUN: 43 mg/dL — ABNORMAL HIGH (ref 8–23)
Calcium, Ion: 1.21 mmol/L (ref 1.15–1.40)
Chloride: 108 mmol/L (ref 98–111)
Creatinine, Ser: 1.3 mg/dL — ABNORMAL HIGH (ref 0.44–1.00)
Glucose, Bld: 220 mg/dL — ABNORMAL HIGH (ref 70–99)
HCT: 41 % (ref 36.0–46.0)
Hemoglobin: 13.9 g/dL (ref 12.0–15.0)
Potassium: 4.2 mmol/L (ref 3.5–5.1)
Sodium: 142 mmol/L (ref 135–145)
TCO2: 23 mmol/L (ref 22–32)

## 2019-03-24 LAB — RESPIRATORY PANEL BY RT PCR (FLU A&B, COVID)
Influenza A by PCR: NEGATIVE
Influenza B by PCR: NEGATIVE
SARS Coronavirus 2 by RT PCR: NEGATIVE

## 2019-03-24 LAB — POCT I-STAT 7, (LYTES, BLD GAS, ICA,H+H)
Acid-base deficit: 22 mmol/L — ABNORMAL HIGH (ref 0.0–2.0)
Bicarbonate: 7.1 mmol/L — ABNORMAL LOW (ref 20.0–28.0)
Calcium, Ion: 1.03 mmol/L — ABNORMAL LOW (ref 1.15–1.40)
HCT: 32 % — ABNORMAL LOW (ref 36.0–46.0)
Hemoglobin: 10.9 g/dL — ABNORMAL LOW (ref 12.0–15.0)
O2 Saturation: 100 %
Patient temperature: 98.6
Potassium: 4.1 mmol/L (ref 3.5–5.1)
Sodium: 147 mmol/L — ABNORMAL HIGH (ref 135–145)
TCO2: 8 mmol/L — ABNORMAL LOW (ref 22–32)
pCO2 arterial: 25.1 mmHg — ABNORMAL LOW (ref 32.0–48.0)
pH, Arterial: 7.06 — CL (ref 7.350–7.450)
pO2, Arterial: 395 mmHg — ABNORMAL HIGH (ref 83.0–108.0)

## 2019-03-24 LAB — DIFFERENTIAL
Abs Immature Granulocytes: 0.14 10*3/uL — ABNORMAL HIGH (ref 0.00–0.07)
Basophils Absolute: 0.1 10*3/uL (ref 0.0–0.1)
Basophils Relative: 0 %
Eosinophils Absolute: 0.2 10*3/uL (ref 0.0–0.5)
Eosinophils Relative: 1 %
Immature Granulocytes: 1 %
Lymphocytes Relative: 64 %
Lymphs Abs: 13 10*3/uL — ABNORMAL HIGH (ref 0.7–4.0)
Monocytes Absolute: 0.4 10*3/uL (ref 0.1–1.0)
Monocytes Relative: 2 %
Neutro Abs: 6.4 10*3/uL (ref 1.7–7.7)
Neutrophils Relative %: 32 %

## 2019-03-24 LAB — CBC
HCT: 36.3 % (ref 36.0–46.0)
HCT: 43.4 % (ref 36.0–46.0)
Hemoglobin: 10.6 g/dL — ABNORMAL LOW (ref 12.0–15.0)
Hemoglobin: 13.5 g/dL (ref 12.0–15.0)
MCH: 30.3 pg (ref 26.0–34.0)
MCH: 30.9 pg (ref 26.0–34.0)
MCHC: 29.2 g/dL — ABNORMAL LOW (ref 30.0–36.0)
MCHC: 31.1 g/dL (ref 30.0–36.0)
MCV: 103.7 fL — ABNORMAL HIGH (ref 80.0–100.0)
MCV: 99.3 fL (ref 80.0–100.0)
Platelets: 114 10*3/uL — ABNORMAL LOW (ref 150–400)
Platelets: 161 10*3/uL (ref 150–400)
RBC: 3.5 MIL/uL — ABNORMAL LOW (ref 3.87–5.11)
RBC: 4.37 MIL/uL (ref 3.87–5.11)
RDW: 13.7 % (ref 11.5–15.5)
RDW: 14 % (ref 11.5–15.5)
WBC: 18.7 10*3/uL — ABNORMAL HIGH (ref 4.0–10.5)
WBC: 20.5 10*3/uL — ABNORMAL HIGH (ref 4.0–10.5)
nRBC: 0 % (ref 0.0–0.2)
nRBC: 0 % (ref 0.0–0.2)

## 2019-03-24 LAB — COMPREHENSIVE METABOLIC PANEL
ALT: 32 U/L (ref 0–44)
AST: 43 U/L — ABNORMAL HIGH (ref 15–41)
Albumin: 3.8 g/dL (ref 3.5–5.0)
Alkaline Phosphatase: 79 U/L (ref 38–126)
Anion gap: 17 — ABNORMAL HIGH (ref 5–15)
BUN: 45 mg/dL — ABNORMAL HIGH (ref 8–23)
CO2: 18 mmol/L — ABNORMAL LOW (ref 22–32)
Calcium: 9.7 mg/dL (ref 8.9–10.3)
Chloride: 107 mmol/L (ref 98–111)
Creatinine, Ser: 1.39 mg/dL — ABNORMAL HIGH (ref 0.44–1.00)
GFR calc Af Amer: 39 mL/min — ABNORMAL LOW (ref >60)
GFR calc non Af Amer: 34 mL/min — ABNORMAL LOW (ref >60)
Glucose, Bld: 235 mg/dL — ABNORMAL HIGH (ref 70–99)
Potassium: 4.4 mmol/L (ref 3.5–5.1)
Sodium: 142 mmol/L (ref 135–145)
Total Bilirubin: 0.5 mg/dL (ref 0.3–1.2)
Total Protein: 7.4 g/dL (ref 6.5–8.1)

## 2019-03-24 LAB — PROTIME-INR
INR: 1 (ref 0.8–1.2)
Prothrombin Time: 13.4 seconds (ref 11.4–15.2)

## 2019-03-24 LAB — POC SARS CORONAVIRUS 2 AG -  ED: SARS Coronavirus 2 Ag: NEGATIVE

## 2019-03-24 LAB — APTT: aPTT: 26 seconds (ref 24–36)

## 2019-03-24 LAB — LACTIC ACID, PLASMA: Lactic Acid, Venous: 7.8 mmol/L (ref 0.5–1.9)

## 2019-03-24 MED ORDER — SODIUM CHLORIDE 0.9 % IV SOLN: 2.0000 g | INTRAVENOUS | Status: DC

## 2019-03-24 MED ORDER — SODIUM CHLORIDE 0.9 % IV BOLUS
1000.0000 mL | Freq: Once | INTRAVENOUS | Status: AC
  Administered 2019-03-24: 1000 mL via INTRAVENOUS

## 2019-03-24 MED ORDER — VANCOMYCIN HCL 750 MG/150ML IV SOLN: 750.0000 mg | INTRAVENOUS | Status: DC

## 2019-03-24 MED ORDER — DOPAMINE-DEXTROSE 3.2-5 MG/ML-% IV SOLN
0.0000 ug/kg/min | INTRAVENOUS | Status: DC
  Administered 2019-03-24: 10 ug/kg/min via INTRAVENOUS

## 2019-03-24 MED ORDER — SODIUM CHLORIDE 0.9 % IV SOLN
2.0000 g | Freq: Once | INTRAVENOUS | Status: AC
  Administered 2019-03-24: 2 g via INTRAVENOUS
  Filled 2019-03-24: qty 2

## 2019-03-24 MED ORDER — PHENYLEPHRINE 40 MCG/ML (10ML) SYRINGE FOR IV PUSH (FOR BLOOD PRESSURE SUPPORT)
80.0000 ug | PREFILLED_SYRINGE | Freq: Once | INTRAVENOUS | Status: AC
  Administered 2019-03-24: 80 ug via INTRAVENOUS

## 2019-03-24 MED ORDER — SODIUM BICARBONATE-DEXTROSE 150-5 MEQ/L-% IV SOLN
150.0000 meq | INTRAVENOUS | Status: DC
  Filled 2019-03-24: qty 1000

## 2019-03-24 MED ORDER — PHENYLEPHRINE 40 MCG/ML (10ML) SYRINGE FOR IV PUSH (FOR BLOOD PRESSURE SUPPORT)
80.0000 ug | PREFILLED_SYRINGE | Freq: Once | INTRAVENOUS | Status: AC
  Administered 2019-03-24: 80 ug via INTRAVENOUS
  Filled 2019-03-24: qty 10

## 2019-03-24 MED ORDER — EPINEPHRINE 1 MG/10ML IJ SOSY
PREFILLED_SYRINGE | INTRAMUSCULAR | Status: AC | PRN
  Administered 2019-03-24 (×3): 1 mg via INTRAVENOUS

## 2019-03-24 MED ORDER — PHENYLEPHRINE 40 MCG/ML (10ML) SYRINGE FOR IV PUSH (FOR BLOOD PRESSURE SUPPORT)
PREFILLED_SYRINGE | INTRAVENOUS | Status: AC
  Administered 2019-03-24: 80 ug via INTRAVENOUS
  Filled 2019-03-24: qty 10

## 2019-03-24 MED ORDER — PHENYLEPHRINE 40 MCG/ML (10ML) SYRINGE FOR IV PUSH (FOR BLOOD PRESSURE SUPPORT): 80.0000 ug | PREFILLED_SYRINGE | Freq: Once | INTRAVENOUS | Status: AC

## 2019-03-24 MED ORDER — EPINEPHRINE 1 MG/10ML IJ SOSY
0.1000 mg | PREFILLED_SYRINGE | Freq: Once | INTRAMUSCULAR | Status: AC
  Administered 2019-03-24: 0.1 mg via INTRAVENOUS

## 2019-03-24 MED ORDER — ETOMIDATE 2 MG/ML IV SOLN
20.0000 mg | Freq: Once | INTRAVENOUS | Status: AC
  Administered 2019-03-24: 20 mg via INTRAVENOUS

## 2019-03-24 MED ORDER — SODIUM BICARBONATE 8.4 % IV SOLN
INTRAVENOUS | Status: AC
  Administered 2019-03-24: 50 meq via INTRAVENOUS
  Filled 2019-03-24: qty 50

## 2019-03-24 MED ORDER — SODIUM CHLORIDE 0.9% FLUSH
3.0000 mL | Freq: Once | INTRAVENOUS | Status: DC
  Administered 2019-03-24: 3 mL via INTRAVENOUS

## 2019-03-24 MED ORDER — NOREPINEPHRINE 4 MG/250ML-% IV SOLN
0.0000 ug/min | INTRAVENOUS | Status: DC
  Administered 2019-03-24: 10 ug/min via INTRAVENOUS
  Filled 2019-03-24: qty 250

## 2019-03-24 MED ORDER — SODIUM BICARBONATE 8.4 % IV SOLN
50.0000 meq | Freq: Once | INTRAVENOUS | Status: AC
  Administered 2019-03-24: 50 meq via INTRAVENOUS

## 2019-03-24 MED ORDER — SUCCINYLCHOLINE CHLORIDE 20 MG/ML IJ SOLN
100.0000 mg | Freq: Once | INTRAMUSCULAR | Status: AC
  Administered 2019-03-24: 100 mg via INTRAVENOUS
  Filled 2019-03-24: qty 5

## 2019-03-24 MED ORDER — SODIUM CHLORIDE 0.9 % IV BOLUS (SEPSIS)
1000.0000 mL | Freq: Once | INTRAVENOUS | Status: AC
  Administered 2019-03-24: 1000 mL via INTRAVENOUS

## 2019-03-24 MED ORDER — LEVETIRACETAM IN NACL 1000 MG/100ML IV SOLN: 1000.0000 mg | Freq: Two times a day (BID) | INTRAVENOUS | Status: DC

## 2019-03-24 MED ORDER — SODIUM BICARBONATE 8.4 % IV SOLN: 50.0000 meq | Freq: Once | INTRAVENOUS | Status: AC

## 2019-03-24 MED ORDER — SODIUM BICARBONATE 8.4 % IV SOLN
INTRAVENOUS | Status: DC
  Filled 2019-03-24: qty 150

## 2019-03-24 MED ORDER — METRONIDAZOLE IN NACL 5-0.79 MG/ML-% IV SOLN
500.0000 mg | Freq: Once | INTRAVENOUS | Status: AC
  Administered 2019-03-24: 500 mg via INTRAVENOUS
  Filled 2019-03-24: qty 100

## 2019-03-24 MED ORDER — VANCOMYCIN HCL IN DEXTROSE 1-5 GM/200ML-% IV SOLN
1000.0000 mg | Freq: Once | INTRAVENOUS | Status: AC
  Administered 2019-03-24: 1000 mg via INTRAVENOUS
  Filled 2019-03-24: qty 200

## 2019-03-24 MED ORDER — PHENYLEPHRINE HCL-NACL 10-0.9 MG/250ML-% IV SOLN
0.0000 ug/min | Freq: Once | INTRAVENOUS | Status: DC
  Filled 2019-03-24: qty 250

## 2019-03-24 MED ORDER — SODIUM CHLORIDE 0.9 % IV BOLUS (SEPSIS): 500.0000 mL | Freq: Once | INTRAVENOUS | Status: DC

## 2019-03-24 MED FILL — Medication: Qty: 1 | Status: AC

## 2019-03-25 LAB — TYPE AND SCREEN
ABO/RH(D): O NEG
Antibody Screen: NEGATIVE
Unit division: 0

## 2019-03-25 LAB — BPAM RBC
Blood Product Expiration Date: 202104142359
ISSUE DATE / TIME: 202103201043
Unit Type and Rh: 5100

## 2019-03-25 LAB — URINE CULTURE: Culture: NO GROWTH

## 2019-03-29 LAB — CULTURE, BLOOD (ROUTINE X 2)
Culture: NO GROWTH
Culture: NO GROWTH
Special Requests: ADEQUATE
Special Requests: ADEQUATE

## 2019-04-05 NOTE — Consult Note (Addendum)
Requesting Physician: Dr. Betsey Holiday    Chief Complaint: Left gaze deviation, not fo  History obtained from: Patient and Chart   HPI:                                                                                                                                       Victoria Sexton is a 86 y.o. female with past medical history significant for coronary artery disease, history of CABG, hypertension, congestive heart failure, dementia, mitral valve prolapse and mitral regurgitation presents to the emergency department as a code stroke after EMS noted left gaze deviation after being initially called for a fall.  EMS received the call that patient fell on the ground while going to the bathroom at 10:50 PM.  When they arrived at the scene patient was speaking and had no weakness when suddenly she had a gaze deviation to the left.  EMS was unable to get her to follow any commands.  In route patient had few apneic spells.  When she arrived to [facility] ER, patient not following commands.  Stated her name once.  There was no gaze deviation and patient withdrawing equally with no focal deficits. Stat CT head of the brain was obtained which showed no hemorrhage, acute ischemic findings. tPA was not administered as unclear if this was a stroke, also reported dark stools x3days.  Patient noted to be hypotensive with blood pressure in the [id number] systolic.   Patient subsequently became more apneic and lethargic and EDP was called to intubate for airway protection.  Also CBC showed leukocytosis of 20 and elevated lactic acid.  Patient treated with antibiotics for suspected sepsis  Date last known well: 03/26/2019 Time last known well: 10:50 PM tPA Given: No, possible acute GI bleed and lower suspicion for stroke/ NIHSS:  Baseline MRS 2  Past Medical History:  Diagnosis Date  . Anginal pain ([place])   . Aortic aneurysm ([place])   . Aortic atherosclerosis ([place]) 10/25/2012   Upper aortic atherosclerosis seen on CT scan  08/13/2002  . Cardiac tumor, ventricular    LVOT  . Congestive heart failure ([place]) 10/04/2011  . Coronary artery disease 10/04/2011  . Dementia ([place])   . Heart murmur   . Hernia, hiatal    Left wears a hernia belt  . Hiatal hernia   . Hx of CABG    -LIMA to LAD,SVG to RCA, SVG to OM, mitral valve repair, patent foramen ovale closure, benign-apearing mass removal.  . Hypertension   . Inguinal hernia right  . Lumbar compression fracture ([place])   . Mild malnutrition ([place]) 10/25/2012  . Mitral regurgitation due to cusp prolapse   . MVP (mitral valve prolapse)   . Osteoporosis   . PVC (premature ventricular contraction)   . S/P CABG x 3 10/12/2011   LIMA to LAD, SVG to OM, SVG to RCA, EVH via left thigh and leg  .  S/P mitral valve repair 10/12/2011   Complex valvuloplasty including triangular resection of flail posterior leaflet with artificial Goretex neocord placement x2 and 26 mm Sorin Memo 3D ring annuloplasty  . Shortness of breath   . Skin cancer 2013   Face.  . Thoracic compression fracture [facility])     Past Surgical History:  Procedure Laterality Date  . ABDOMINAL HYSTERECTOMY    . BACK SURGERY    . CORONARY ARTERY BYPASS GRAFT  10/12/2011   Procedure: CORONARY ARTERY BYPASS GRAFTING (CABG);  Surgeon: Rexene Alberts, MD;  Location: [place];  Service: Open Heart Surgery;  Laterality: N/A;  RESECTION OF LV MASS  . HEMORRHOID SURGERY    . HEMORROIDECTOMY    . HIP ARTHROPLASTY Right 04/17/2014   Procedure: ARTHROPLASTY BIPOLAR HIP;  Surgeon: Paralee Cancel, MD;  Location: [facility];  Service: Orthopedics;  Laterality: Right;  . LEFT AND RIGHT HEART CATHETERIZATION WITH CORONARY ANGIOGRAM N/A 09/29/2011   Procedure: LEFT AND RIGHT HEART CATHETERIZATION WITH CORONARY ANGIOGRAM;  Surgeon: Candee Furbish, MD;  Location: [facility] CATH LAB;  Service: Cardiovascular;  Laterality: N/A;  . leg fx repair     Right  . MITRAL VALVE REPAIR  10/12/2011   Procedure: MITRAL VALVE REPAIR (MVR);  Surgeon: Rexene Alberts, MD;  Location: [place];  Service: Open Heart Surgery;  Laterality: N/A;  MITRAL VALVE REPAIR  . PATENT FORAMEN OVALE CLOSURE  10/12/2011   Procedure: PATENT FORAMEN OVALE CLOSURE;  Surgeon: Rexene Alberts, MD;  Location: [place];  Service: Open Heart Surgery;  Laterality: N/A;  . SKIN CANCER EXCISION  2013   not melanoma  . TEE WITHOUT CARDIOVERSION  11/13/2010   Procedure: TRANSESOPHAGEAL ECHOCARDIOGRAM (TEE);  Surgeon: Candee Furbish, MD;  Location: [facility] ENDOSCOPY;  Service: Cardiovascular;  Laterality: N/A;  . TOTAL HIP ARTHROPLASTY Right     Family History  Problem Relation Age of Onset  . Heart attack Mother   . Lung cancer Father    Social History:  reports that she quit smoking about 8 years ago. Her smoking use included cigarettes. She has a 15.00 pack-year smoking history. She has never used smokeless tobacco. She reports that she does not drink alcohol or use drugs.  Allergies:  Allergies  Allergen Reactions  . Hydrocodone-Acetaminophen Anxiety    Pt nervous and shaky per pt husband  . Penicillins Palpitations    Has patient had a PCN reaction causing immediate rash, facial/tongue/throat swelling, SOB or lightheadedness with hypotension: No Has patient had a PCN reaction causing severe rash involving mucus membranes or skin necrosis: No Has patient had a PCN reaction that required hospitalization No Has patient had a PCN reaction occurring within the last 10 years: No If all of the above answers are "NO", then may proceed with Cephalosporin use.   . Sulfa Antibiotics Rash    Medications:  I reviewed home medications   ROS:                                                                                                                                     Unable to review systems due to patient's mental status   Examination:                                                                                                       General: Appears well-developed  Psych: Affect appropriate to situation Eyes: No scleral injection HENT: No OP obstrucion Head: Normocephalic.  Cardiovascular: Irregular rate and rhythm, bradycardic Respiratory: Effort normal and breath sounds normal to anterior ascultation GI: Soft.  No distension. There is no tenderness.  Skin: WDI    Neurological Examination Mental Status: Awake but not following any commands, mostly nonverbal except to say her name once.  Squeezed with her right hand on one occasion.  Not cooperative on exam Cranial Nerves: II: Visual fields : Unable to assess,  III,IV, VI: ptosis not present, no forced gaze deviation pupils equal, round, reactive to light and accommodation VII: Face appears symmetric  XII: midline tongue extension Motor: Right : Upper extremity   3/5    Left:     Upper extremity   3/5  Lower extremity   2/5     Lower extremity   2/5 Tone and bulk:normal tone throughout; no atrophy noted Sensory: Withdraws to noxious stimulus bilaterally Deep Tendon Reflexes: 2+ bilateral patellar reflexes Plantars: Right: downgoing   Left: downgoing Cerebellar: Unable to assess ataxia      Lab Results: Basic Metabolic Panel: Recent Labs  Lab Mar 28, 2019 0001 Mar 28, 2019 0009  NA 142 142  K 4.4 4.2  CL 107 108  CO2 18*  --   GLUCOSE 235* 220*  BUN 45* 43*  CREATININE 1.39* 1.30*  CALCIUM 9.7  --     CBC: Recent Labs  Lab 03-28-2019 0001 03-28-2019 0009  WBC 20.5*  --   HGB 13.5 13.9  HCT 43.4 41.0  MCV 99.3  --   PLT 161  --     Coagulation Studies: Recent Labs    2019-03-28 0001  LABPROT 13.4  INR 1.0    Imaging: CT HEAD CODE STROKE WO CONTRAST  Result Date: 28-Mar-2019 CLINICAL DATA:  Code stroke.  Fall EXAM: CT HEAD WITHOUT CONTRAST TECHNIQUE: Contiguous axial images were obtained from the base of the skull through the vertex without intravenous contrast. COMPARISON:   None. FINDINGS:  Brain: There is no mass, hemorrhage or extra-axial collection. The size and configuration of the ventricles and extra-axial CSF spaces are normal. There is hypoattenuation of the periventricular white matter, most commonly indicating chronic ischemic microangiopathy. Vascular: No abnormal hyperdensity of the major intracranial arteries or dural venous sinuses. No intracranial atherosclerosis. Skull: The visualized skull base, calvarium and extracranial soft tissues are normal. Sinuses/Orbits: No fluid levels or advanced mucosal thickening of the visualized paranasal sinuses. No mastoid or middle ear effusion. The orbits are normal. ASPECTS [facility] Stroke Program Early CT Score) - Ganglionic level infarction (caudate, lentiform nuclei, internal capsule, insula, M1-M3 cortex): 7 - Supraganglionic infarction (M4-M6 cortex): 3 Total score (0-10 with 10 being normal): 10 IMPRESSION: 1. No acute hemorrhage or mass lesion. 2. ASPECTS is 10. * These results were communicated to Dr. Karena Addison  at 12:14 am on 2019/04/03 by text page via the [facility] messaging system. Electronically Signed   By: Ulyses Jarred M.D.   On: 2019-04-03 00:25     ASSESSMENT AND PLAN  86 y.o. female with past medical history significant for coronary artery disease, history of CABG, hypertension, congestive heart failure, dementia, mitral valve prolapse and mitral regurgitation presents to the emergency department as a code stroke after EMS noted left gaze deviation after being initially called for a fall. Patient become more active, appeared to be post-ictal. She briefly state her name. Moved both upper extremities against gravity equally. CT head unremarkable for acute findings. Felt likely seizure with post-ictal state. However, on returning to ED blood pressure dropped to 70 systolic., patient haviung increased difficulty of breathing. Intubated for airway protection.   Reassessed patient after intubation, despite being  off sedation, not withdrawing much on exam.  Was forcefully trying to close her eyes when attempting to open them.  No rhythmic twitching movements concerning for nonconvulsive status epilepticus.  Will obtain CTA to r/o basilar artery stroke : If patient becomes stable for CT head.   Acute toxic metabolic encephalopathy- Differentials include seizure with postictal state vs sepsis versus acute ischemic stroke Septic shock  Recommendations -Agree with intubation for airway protection -Loaded with 1 g Keppra and start 500 mg twice daily -Obtain MRI brain and CTA of the head and neck -Treat underlying metabolic conditions and possible sepsis work-up -Seizure precautions  CRITICAL CARE Performed by: Lanice Schwab    Total critical care time: 45 minutes  Critical care time was exclusive of separately billable procedures and treating other patients.  Critical care was necessary to treat or prevent imminent or life-threatening deterioration.  Critical care was time spent personally by me on the following activities: development of treatment plan with patient and/or surrogate as well as nursing, discussions with consultants, evaluation of patient's response to treatment, examination of patient, obtaining history from patient or surrogate, ordering and performing treatments and interventions, ordering and review of laboratory studies, ordering and review of radiographic studies, pulse oximetry and re-evaluation of patient's condition.  Addendum  Patient continues to be too unstable for CT a of the head, blood pressure dropped to the [id number] systolics..  At 430 am -patient had PEA arrest.  CPR was initiated.     [facility] Pager Number [phone]

## 2019-04-05 NOTE — ED Notes (Signed)
Informed RN Dorothea Ogle pt POC covid neg

## 2019-04-05 NOTE — Progress Notes (Signed)
Chaplain initially responded to page to minister to Victoria Sexton who was sitting in the hallway outside his wife's room.  Chaplain offered to sit with Victoria Sexton.  Victoria Sexton was pleasant but denied wanting any interaction with Chaplain at that time.  Returned when paged at Victoria Sexton's death.  Victoria Sexton denied wanting any interaction with Chaplain at that time.  De Burrs Chaplain Resident

## 2019-04-05 NOTE — ED Notes (Signed)
Paged Dr. Nancy Fetter to [phone]

## 2019-04-05 NOTE — ED Notes (Signed)
Dr. Betsey Holiday again notified of pt's BP. Bicarb given per MAR.

## 2019-04-05 NOTE — Code Documentation (Signed)
Family at beside. Family given emotional support. 

## 2019-04-05 NOTE — ED Notes (Signed)
Arterial lin eplaced by Dr. Betsey Holiday at bedside RT at bedside

## 2019-04-05 NOTE — ED Provider Notes (Addendum)
[facility] EMERGENCY DEPARTMENT Provider Note   CSN: [id number] Arrival date & time: 03/16/2019  2359     History Chief Complaint  Patient presents with  . Code Stroke    Victoria Sexton is a 86 y.o. female.  Patient brought to the emergency department by ambulance from home.  Husband reports that she got up from bed to go to the bathroom, stumbled and then fell.  Reports that she fell onto her backside into a seated position.  He then noted that she had a very glazed look on her eyes and would not focus.  EMS have activated a code stroke during transport.  At arrival patient is nonverbal. Level V Caveat due to acuity/nonverbal patient.        Past Medical History:  Diagnosis Date  . Anginal pain ([place])   . Aortic aneurysm ([place])   . Aortic atherosclerosis ([place]) 10/25/2012   Upper aortic atherosclerosis seen on CT scan 08/13/2002  . Cardiac tumor, ventricular    LVOT  . Congestive heart failure ([place]) 10/04/2011  . Coronary artery disease 10/04/2011  . Dementia ([place])   . Heart murmur   . Hernia, hiatal    Left wears a hernia belt  . Hiatal hernia   . Hx of CABG    -LIMA to LAD,SVG to RCA, SVG to OM, mitral valve repair, patent foramen ovale closure, benign-apearing mass removal.  . Hypertension   . Inguinal hernia right  . Lumbar compression fracture ([place])   . Mild malnutrition ([place]) 10/25/2012  . Mitral regurgitation due to cusp prolapse   . MVP (mitral valve prolapse)   . Osteoporosis   . PVC (premature ventricular contraction)   . S/P CABG x 3 10/12/2011   LIMA to LAD, SVG to OM, SVG to RCA, EVH via left thigh and leg  . S/P mitral valve repair 10/12/2011   Complex valvuloplasty including triangular resection of flail posterior leaflet with artificial Goretex neocord placement x2 and 26 mm Sorin Memo 3D ring annuloplasty  . Shortness of breath   . Skin cancer 2013   Face.  . Thoracic compression fracture [facility])     Patient Active Problem List   Diagnosis Date Noted  . Chest pain in adult 08/12/2017  . Protein-calorie malnutrition, severe 12/20/2014  . Lumbar compression fracture ([place])   . Bradycardia 12/19/2014  . Intractable low back pain 12/19/2014  . Pancreatitis 12/19/2014  . Compression fracture 12/19/2014  . Hypokalemia 12/19/2014  . Lung nodule 12/19/2014  . Aortic aneurysm ([place]) 12/19/2014  . Mild dementia ([place]) 08/20/2014  . Preop cardiovascular exam 04/17/2014  . H/O mitral valve repair 04/17/2014  . Fracture of femoral neck, right ([place]) 04/16/2014  . Femoral neck fracture ([place]) 04/16/2014  . Hypoxia 12/29/2013  . Acute diastolic CHF (congestive heart failure) ([place]) 12/29/2013  . SOB (shortness of breath)   . HLD (hyperlipidemia)   . Aortic atherosclerosis ([place]) 10/25/2012  . Mild malnutrition ([place]) 10/25/2012  . S/P MVR (mitral valve repair) 01/17/2012  . S/P mitral valve repair 10/12/2011  . S/P CABG x 3 10/12/2011  . Coronary artery disease 10/04/2011  . Congestive heart failure ([place]) 10/04/2011  . Shortness of breath 09/28/2011  . Right inguinal hernia 09/01/2011  . Mitral valve disorders(424.0) 02/08/2011  . Mitral regurgitation due to cusp prolapse 11/23/2010  . Cardiac tumor, ventricular 11/23/2010  . Mitral regurgitation 11/12/2010  . Essential hypertension, benign 11/12/2010    Past Surgical History:  Procedure Laterality Date  . ABDOMINAL  HYSTERECTOMY    . BACK SURGERY    . CORONARY ARTERY BYPASS GRAFT  10/12/2011   Procedure: CORONARY ARTERY BYPASS GRAFTING (CABG);  Surgeon: Rexene Alberts, MD;  Location: [facility];  Service: Open Heart Surgery;  Laterality: N/A;  RESECTION OF LV MASS  . HEMORRHOID SURGERY    . HEMORROIDECTOMY    . HIP ARTHROPLASTY Right 04/17/2014   Procedure: ARTHROPLASTY BIPOLAR HIP;  Surgeon: Paralee Cancel, MD;  Location: [facility];  Service: Orthopedics;  Laterality: Right;  . LEFT AND RIGHT HEART CATHETERIZATION WITH CORONARY ANGIOGRAM N/A 09/29/2011   Procedure: LEFT AND  RIGHT HEART CATHETERIZATION WITH CORONARY ANGIOGRAM;  Surgeon: Candee Furbish, MD;  Location: [facility] CATH LAB;  Service: Cardiovascular;  Laterality: N/A;  . leg fx repair     Right  . MITRAL VALVE REPAIR  10/12/2011   Procedure: MITRAL VALVE REPAIR (MVR);  Surgeon: Rexene Alberts, MD;  Location: [facility];  Service: Open Heart Surgery;  Laterality: N/A;  MITRAL VALVE REPAIR  . PATENT FORAMEN OVALE CLOSURE  10/12/2011   Procedure: PATENT FORAMEN OVALE CLOSURE;  Surgeon: Rexene Alberts, MD;  Location: [facility];  Service: Open Heart Surgery;  Laterality: N/A;  . SKIN CANCER EXCISION  2013   not melanoma  . TEE WITHOUT CARDIOVERSION  11/13/2010   Procedure: TRANSESOPHAGEAL ECHOCARDIOGRAM (TEE);  Surgeon: Candee Furbish, MD;  Location: [facility]OSCOPY;  Service: Cardiovascular;  Laterality: N/A;  . TOTAL HIP ARTHROPLASTY Right      OB History   No obstetric history on file.     Family History  Problem Relation Age of Onset  . Heart attack Mother   . Lung cancer Father     Social History   Tobacco Use  . Smoking status: Former Smoker    Packs/day: 0.50    Years: 30.00    Pack years: 15.00    Types: Cigarettes    Quit date: 11/13/2010    Years since quitting: 8.3  . Smokeless tobacco: Never Used  Substance Use Topics  . Alcohol use: No  . Drug use: No    Home Medications Prior to Admission medications   Medication Sig Start Date End Date Taking? Authorizing Provider  aspirin 81 MG tablet Take 81 mg by mouth daily.   Yes [provider]  Cholecalciferol (VITAMIN D3) 2000 UNITS TABS Take 2,000 Units by mouth daily.    Yes [provider]  Coenzyme Q10 (CO Q-10) 100 MG CAPS Take 100 mg by mouth daily.    Yes [provider]  isosorbide mononitrate (IMDUR) 30 MG 24 hr tablet Take 1 tablet (30 mg total) by mouth daily. 08/17/17 03/23/28 Yes Jerline Pain, MD  lisinopril (PRINIVIL,ZESTRIL) 20 MG tablet Take 1 tablet (20 mg total) by mouth daily. 10/29/15  Yes Jerline Pain,  MD  metoprolol tartrate (LOPRESSOR) 25 MG tablet Take 0.5 tablets (12.5 mg total) by mouth 2 (two) times daily. 10/29/15  Yes Jerline Pain, MD  mirtazapine (REMERON) 15 MG tablet Take 15 mg by mouth at bedtime. 12/21/18  Yes [provider]  multivitamin-lutein (OCUVITE-LUTEIN) CAPS Take 1 capsule by mouth daily.    Yes [provider]  NAMENDA XR 28 MG CP24 24 hr capsule Take 28 mg by mouth daily. 12/21/18  Yes [provider]  Nutritional Supplements (ENSURE ENLIVE PO) Take 2 Bottles by mouth at bedtime.    Yes [provider]  OVER THE COUNTER MEDICATION Take 1 tablet by mouth daily. Omega Red  Yes [provider]  Rivastigmine 13.3 MG/24HR PT24 Place 13.3 mg onto the skin at bedtime.    Yes [provider]  simvastatin (ZOCOR) 5 MG tablet TAKE 1 TABLET EACH DAY. Patient taking differently: Take 5 mg by mouth at bedtime.  12/16/16  Yes Jerline Pain, MD  vitamin E 400 UNIT capsule Take 400 Units by mouth daily.   Yes [provider]    Allergies    Hydrocodone-acetaminophen, Penicillins, and Sulfa antibiotics  Review of Systems   Review of Systems  Unable to perform ROS: Acuity of condition    Physical Exam Updated Vital Signs BP 126/67   Pulse 90   Resp (!) 32   Ht 5\' 2"  (1.575 m)   Wt 46.1 kg   SpO2 100%   BMI 18.59 kg/m   Physical Exam Vitals and nursing note reviewed.  Constitutional:      General: She is in acute distress.  HENT:     Head: Atraumatic.  Eyes:     Comments: 50mm bilateral - sluggish  Cardiovascular:     Rate and Rhythm: Tachycardia present.     Heart sounds: Normal heart sounds.  Pulmonary:     Effort: Tachypnea present.  Abdominal:     Palpations: Abdomen is soft.  Musculoskeletal:        General: No deformity.     Cervical back: Neck supple.  Skin:    General: Skin is warm and dry.  Neurological:     GCS: GCS eye subscore is 1. GCS verbal subscore is 2. GCS motor subscore is  5.     Cranial Nerves: Cranial nerves are intact.     ED Results / Procedures / Treatments   Labs (all labs ordered are listed, but only abnormal results are displayed) Labs Reviewed  CBC - Abnormal; Notable for the following components:      Result Value   WBC 20.5 (*)    All other components within normal limits  DIFFERENTIAL - Abnormal; Notable for the following components:   Lymphs Abs 13.0 (*)    Abs Immature Granulocytes 0.14 (*)    All other components within normal limits  COMPREHENSIVE METABOLIC PANEL - Abnormal; Notable for the following components:   CO2 18 (*)    Glucose, Bld 235 (*)    BUN 45 (*)    Creatinine, Ser 1.39 (*)    AST 43 (*)    GFR calc non Af Amer 34 (*)    GFR calc Af Amer 39 (*)    Anion gap 17 (*)    All other components within normal limits  URINALYSIS, ROUTINE W REFLEX MICROSCOPIC - Abnormal; Notable for the following components:   Protein, ur 30 (*)    Leukocytes,Ua TRACE (*)    Bacteria, UA RARE (*)    All other components within normal limits  LACTIC ACID, PLASMA - Abnormal; Notable for the following components:   Lactic Acid, Venous 7.8 (*)    All other components within normal limits  CBC - Abnormal; Notable for the following components:   WBC 18.7 (*)    RBC 3.50 (*)    Hemoglobin 10.6 (*)    MCV 103.7 (*)    MCHC 29.2 (*)    Platelets 114 (*)    All other components within normal limits  I-STAT CHEM 8, ED - Abnormal; Notable for the following components:   BUN 43 (*)    Creatinine, Ser 1.30 (*)    Glucose, Bld 220 (*)  All other components within normal limits  CBG MONITORING, ED - Abnormal; Notable for the following components:   Glucose-Capillary 201 (*)    All other components within normal limits  CBG MONITORING, ED - Abnormal; Notable for the following components:   Glucose-Capillary 263 (*)    All other components within normal limits  POCT I-STAT 7, (LYTES, BLD GAS, ICA,H+H) - Abnormal; Notable for the following  components:   pH, Arterial 7.060 (*)    pCO2 arterial 25.1 (*)    pO2, Arterial 395.0 (*)    Bicarbonate 7.1 (*)    TCO2 8 (*)    Acid-base deficit 22.0 (*)    Sodium 147 (*)    Calcium, Ion 1.03 (*)    HCT 32.0 (*)    Hemoglobin 10.9 (*)    All other components within normal limits  RESPIRATORY PANEL BY RT PCR (FLU A&B, COVID)  CULTURE, BLOOD (ROUTINE X 2)  CULTURE, BLOOD (ROUTINE X 2)  URINE CULTURE  PROTIME-INR  APTT  LACTIC ACID, PLASMA  BLOOD GAS, ARTERIAL  POC SARS CORONAVIRUS 2 AG -  ED  TYPE AND SCREEN    EKG None  Radiology DG Chest Port 1 View  Result Date: 09-Apr-2019 CLINICAL DATA:  ET tube and OG tube placement EXAM: PORTABLE CHEST 1 VIEW COMPARISON:  08/12/2017 FINDINGS: Right central line is in place with the tip in the SVC. Endotracheal tube tip is 2.5 cm above the carina. NG tube coils in a large hiatal hernia. Mild cardiomegaly. Prior CABG. No confluent opacities or effusions. No acute bony abnormality. IMPRESSION: Support devices as above. Cardiomegaly.  Prior CABG. Large hiatal hernia. No acute cardiopulmonary disease. Electronically Signed   By: Rolm Baptise M.D.   On: Apr 09, 2019 02:11   CT HEAD CODE STROKE WO CONTRAST  Result Date: 2019/04/09 CLINICAL DATA:  Code stroke.  Fall EXAM: CT HEAD WITHOUT CONTRAST TECHNIQUE: Contiguous axial images were obtained from the base of the skull through the vertex without intravenous contrast. COMPARISON:  None. FINDINGS: Brain: There is no mass, hemorrhage or extra-axial collection. The size and configuration of the ventricles and extra-axial CSF spaces are normal. There is hypoattenuation of the periventricular white matter, most commonly indicating chronic ischemic microangiopathy. Vascular: No abnormal hyperdensity of the major intracranial arteries or dural venous sinuses. No intracranial atherosclerosis. Skull: The visualized skull base, calvarium and extracranial soft tissues are normal. Sinuses/Orbits: No fluid levels  or advanced mucosal thickening of the visualized paranasal sinuses. No mastoid or middle ear effusion. The orbits are normal. ASPECTS [facility] Stroke Program Early CT Score) - Ganglionic level infarction (caudate, lentiform nuclei, internal capsule, insula, M1-M3 cortex): 7 - Supraganglionic infarction (M4-M6 cortex): 3 Total score (0-10 with 10 being normal): 10 IMPRESSION: 1. No acute hemorrhage or mass lesion. 2. ASPECTS is 10. * These results were communicated to Dr. Karena Addison Aroor at 12:14 am on 2019/04/09 by text page via the [facility] messaging system. Electronically Signed   By: Ulyses Jarred M.D.   On: 09-Apr-2019 00:25    Procedures .Central Line  Date/Time: 04-09-2019 1:30 AM Performed by: Orpah Greek, MD Authorized by: Orpah Greek, MD   Consent:    Consent obtained:  Verbal   Consent given by:  Spouse   Risks discussed:  Arterial puncture, incorrect placement, bleeding, infection and pneumothorax Universal protocol:    Procedure explained and questions answered to patient or proxy's satisfaction: yes     Relevant documents present and verified: yes     Test  results available and properly labeled: yes     Imaging studies available: yes     Required blood products, implants, devices, and special equipment available: yes     Site/side marked: yes     Immediately prior to procedure, a time out was called: yes     Patient identity confirmed:  Hospital-assigned identification number Pre-procedure details:    Hand hygiene: Hand hygiene performed prior to insertion     Sterile barrier technique: All elements of maximal sterile technique followed     Skin preparation:  2% chlorhexidine   Skin preparation agent: Skin preparation agent completely dried prior to procedure   Anesthesia (see MAR for exact dosages):    Anesthesia method:  Local infiltration   Local anesthetic:  Lidocaine 1% w/o epi Procedure details:    Location:  R internal jugular   Catheter size:  7  Fr   Landmarks identified: yes     Ultrasound guidance: yes     Sterile ultrasound techniques: Sterile gel and sterile probe covers were used     Number of attempts:  1   Successful placement: yes   Post-procedure details:    Post-procedure:  Dressing applied and line sutured   Assessment:  Blood return through all ports, no pneumothorax on x-ray, placement verified by x-ray and free fluid flow   Patient tolerance of procedure:  Tolerated well, no immediate complications Procedure Name: Intubation Date/Time: 04-21-19 1:00 AM Performed by: Orpah Greek, MD Pre-anesthesia Checklist: Patient identified, Patient being monitored, Emergency Drugs available, Timeout performed and Suction available Oxygen Delivery Method: Non-rebreather mask Preoxygenation: Pre-oxygenation with 100% oxygen Induction Type: Rapid sequence Ventilation: Mask ventilation without difficulty Laryngoscope Size: Glidescope and 3 Grade View: Grade I Tube size: 7.5 mm Number of attempts: 1 Placement Confirmation: ETT inserted through vocal cords under direct vision,  CO2 detector and Breath sounds checked- equal and bilateral Secured at: 24 cm Tube secured with: ETT holder Dental Injury: Teeth and Oropharynx as per pre-operative assessment     ARTERIAL LINE  Date/Time: 04/21/2019 3:45 AM Performed by: Orpah Greek, MD Authorized by: Orpah Greek, MD   Consent:    Consent obtained:  Verbal   Consent given by:  Spouse   Risks discussed:  Bleeding, ischemia, infection and pain Universal protocol:    Procedure explained and questions answered to patient or proxy's satisfaction: yes     Relevant documents present and verified: yes     Test results available and properly labeled: yes     Imaging studies available: yes     Required blood products, implants, devices, and special equipment available: yes     Site/side marked: yes     Immediately prior to procedure a time out was  called: yes     Patient identity confirmed:  Hospital-assigned identification number Indications:    Indications: hemodynamic monitoring   Pre-procedure details:    Skin preparation:  2% Chlorhexidine   Preparation: Patient was prepped and draped in sterile fashion   Anesthesia (see MAR for exact dosages):    Anesthesia method:  Local infiltration   Local anesthetic:  Lidocaine 1% w/o epi Procedure details:    Location:  R femoral   Needle gauge:  20 G   Placement technique:  Seldinger   Number of attempts:  1   Transducer: waveform confirmed   Post-procedure details:    Post-procedure:  Sutured   CMS:  Normal   Patient tolerance of procedure:  Tolerated well, no immediate complications .  Critical Care Performed by: Orpah Greek, MD Authorized by: Orpah Greek, MD   Critical care provider statement:    Critical care time (minutes):  40   Critical care time was exclusive of:  Separately billable procedures and treating other patients   Critical care was necessary to treat or prevent imminent or life-threatening deterioration of the following conditions:  Cardiac failure, respiratory failure, sepsis, CNS failure or compromise and shock   Critical care was time spent personally by me on the following activities:  Blood draw for specimens, ordering and performing treatments and interventions, ordering and review of laboratory studies, development of treatment plan with patient or surrogate, discussions with consultants, ordering and review of radiographic studies, pulse oximetry, evaluation of patient's response to treatment, re-evaluation of patient's condition, review of old charts, examination of patient, gastric intubation, vascular access procedures, ventilator management, obtaining history from patient or surrogate and interpretation of cardiac output measurements   I assumed direction of critical care for this patient from another provider in my specialty: no   NG  placement  Date/Time: 04/16/2019 3:49 AM Performed by: Orpah Greek, MD Authorized by: Orpah Greek, MD  Consent: Verbal consent obtained. Risks and benefits: risks, benefits and alternatives were discussed Consent given by: spouse Patient understanding: patient states understanding of the procedure being performed Patient consent: the patient's understanding of the procedure matches consent given Procedure consent: procedure consent matches procedure scheduled Relevant documents: relevant documents present and verified Test results: test results available and properly labeled Site marked: the operative site was marked Imaging studies: imaging studies available Required items: required blood products, implants, devices, and special equipment available Patient identity confirmed: provided demographic data Time out: Immediately prior to procedure a "time out" was called to verify the correct patient, procedure, equipment, support staff and site/side marked as required. Local anesthesia used: no  Anesthesia: Local anesthesia used: no  Sedation: Patient sedated: no  Patient tolerance: patient tolerated the procedure well with no immediate complications  CPR  Date/Time: 04-16-2019 4:43 AM Performed by: Orpah Greek, MD Authorized by: Orpah Greek, MD  CPR Procedure Details:    ACLS/BLS initiated by EMS: No     CPR/ACLS performed in the ED: Yes     Duration of CPR (minutes):  3   Outcome: ROSC obtained    CPR performed via ACLS guidelines under my direct supervision.  See RN documentation for details including defibrillator use, medications, doses and timing.   (including critical care time)  Medications Ordered in ED Medications  sodium chloride 0.9 % bolus 1,000 mL (0 mLs Intravenous Stopped 04-16-19 0222)    And  sodium chloride 0.9 % bolus 500 mL (500 mLs Intravenous Not Given Apr 16, 2019 0223)  vancomycin (VANCOCIN) IVPB 1000 mg/200 mL  premix (1,000 mg Intravenous New Bag/Given 04/16/19 0331)  PHENYLephrine 40 mcg/ml in normal saline Adult IV Push Syringe (has no administration in time range)  ceFEPIme (MAXIPIME) 2 g in sodium chloride 0.9 % 100 mL IVPB (has no administration in time range)  vancomycin (VANCOREADY) IVPB 750 mg/150 mL (has no administration in time range)  norepinephrine (LEVOPHED) 4mg  in 249mL premix infusion (15 mcg/min Intravenous Rate/Dose Change 04-16-2019 0343)  levETIRAcetam (KEPPRA) IVPB 1000 mg/100 mL premix (has no administration in time range)  sodium chloride 0.9 % bolus 1,000 mL (0 mLs Intravenous Stopped 2019-04-16 0130)  etomidate (AMIDATE) injection 20 mg (20 mg Intravenous Given 04-16-2019 0102)  succinylcholine (ANECTINE) injection 100 mg (100 mg Intravenous Given Apr 16, 2019  0103)  ceFEPIme (MAXIPIME) 2 g in sodium chloride 0.9 % 100 mL IVPB (0 g Intravenous Stopped Apr 19, 2019 0314)  metroNIDAZOLE (FLAGYL) IVPB 500 mg (0 mg Intravenous Stopped 2019-04-19 0344)  sodium chloride 0.9 % bolus 1,000 mL (0 mLs Intravenous Stopped 19-Apr-2019 0222)  sodium chloride 0.9 % bolus 1,000 mL (0 mLs Intravenous Stopped Apr 19, 2019 0150)  sodium chloride 0.9 % bolus 1,000 mL (0 mLs Intravenous Stopped 2019-04-19 0115)  EPINEPHrine (ADRENALIN) 1 MG/10ML injection 0.1 mg (0.1 mg Intravenous Given 04-19-19 0109)    ED Course  I have reviewed the triage vital signs and the nursing notes.  Pertinent labs & imaging results that were available during my care of the patient were reviewed by me and considered in my medical decision making (see chart for details).    MDM Rules/Calculators/A&P                      Patient brought to the emergency department, initially as a code stroke.  Screening CT did not show any acute infarct.  Patient did not have any focal findings on exam.  Dr. Lorraine Lax, on-call for neurology did not feel that this was consistent with a stroke.  He did recommend covering for possible seizure with Keppra.  Patient noted to  have very poor respiratory effort and was not awake and alert enough to protect her airway.  She was intubated.  Patient noted to be tachycardic, tachypneic, hypotensive at arrival.  No obvious source of infection was noted but sepsis was considered.  She was administered weight-based IV fluids and broad-spectrum antibiotics.  Patient had refractory hypotension despite fluids, was given an additional 2 L of fluids with persistent hypotension, initiated on Levophed.  Prior to initiation of Levophed, patient became bradycardic and profoundly hypotensive, was given 1 dose of epinephrine.  She never lost pulses.  Further discussion with the husband reveals that she did have a large bowel movement earlier tonight and he reports that it was very dark, possibly black.  NG tube suctioned approximately 100 mL of blood.  Cannot rule out GI bleeding at this time.  Patient's hemoglobin, however is stable.  She was 13.5 at arrival.  After 4 L of fluids still above 10.  She had a marked lactic acidosis.  Patient also has a significant leukocytosis.  No source of infection has been identified at this time but she is covered for sepsis.  Patient had central line placed after discussion with her husband.  This was placed without difficulty.  After titration of Levophed up to 30 mcg/min, she had very labile blood pressures.  An arterial line was therefore placed.  Respiratory could not place a radial artery line.  I did attempt to place brachial artery line under ultrasound but was unable to cannulate the very small vessel.  Right femoral artery line was therefore placed.  Discussed with Dr. Lucile Shutters, on-call for critical care.  Request CT abdomen and pelvis.  Will send ground team to evaluate patient for admission.  Addendum 04:40 -called to room for decreasing blood pressures.  Patient blood pressure dropping down into the 40s.  She was administered Neo-Synephrine IV pushes without improvement.  Started on dopamine.   Patient administered multiple bicarb pushes as well because of her acidosis.  No significant improvement.  Discussed with husband likelihood of poor outcome.  Asked if he wanted to do CPR, did inform him that it would likely not change the outcome.  He did want to try.  Blood  pressure dropped to 0 (no waveform on art line) and CPR initiated.  She was given 2 doses of epi.  CPR discontinued and there was a waveform on the art line.  Has been sitting at bedside.  Do not recommend repeat CPR.  Addendum 04:50 - no pulses, time of death 4:46AM. PCP notified.    Final Clinical Impression(s) / ED Diagnoses Final diagnoses:  Septic shock [facility])    Rx / DC Orders ED Discharge Orders    None       Chin Wachter, Gwenyth Allegra, MD 2019/04/11 0350    Orpah Greek, MD 04-11-2019 [phone]

## 2019-04-05 NOTE — ED Notes (Signed)
Late entry due to continuous pt care NRB placed on pt RT at bedside  0102 20 mg of etomidate given [id number] of succ given Pt intubated by Dr. Betsey Holiday. Size 7.5 ETT 24 at the lip. Positive color change bilat breath sounds present OG inserted using glidescope by MD Pollina. +coffee ground, brown drainage  Foley inserted under sterile technique

## 2019-04-05 NOTE — Code Documentation (Signed)
0431: CPR Initiated, initial rhythm PEA 0431: Epi 1:10 1mg  IV administered 0434: Epi 1:10 1mg  IV administered  0435: Sinus Rhythm with frequent ectopy; ROSC 0435: BP 55/21, HR 110

## 2019-04-05 NOTE — ED Triage Notes (Signed)
Code stroke

## 2019-04-05 NOTE — ED Notes (Signed)
Unable to get accurate manual or automatic BP on pt. Dr. Betsey Holiday aware

## 2019-04-05 NOTE — ED Notes (Signed)
Pt prepared to go to CT. BP dropping on monitor. Dr. Betsey Holiday notified. CT cancelled at this time.

## 2019-04-05 NOTE — ED Notes (Signed)
Dr. Betsey Holiday at bedside Defib pads placed on pt

## 2019-04-05 NOTE — Progress Notes (Signed)
Pharmacy Antibiotic Note  Victoria Sexton is a 86 y.o. female admitted on 03/22/2019 with sepsis.  Pharmacy has been consulted for Vancomycin and Cefepime dosing.  Plan: Cefepime 2gm IV q24h Vancomycin 1000mg  now then 750 mg IV Q 48 hrs. Goal AUC 400-550. Expected AUC: 510 SCr used: 1.3 Will f/u renal function, micro data, and pt's clinical condition Vanc levels prn   Height: 5\' 2"  (157.5 cm) Weight: 101 lb 10.1 oz (46.1 kg) IBW/kg (Calculated) : 50.1  No data recorded.  Recent Labs  Lab 2019-04-01 0001 04-01-2019 0009  WBC 20.5*  --   CREATININE 1.39* 1.30*    Estimated Creatinine Clearance: 21.4 mL/min (A) (by C-G formula based on SCr of 1.3 mg/dL (H)).    Allergies  Allergen Reactions  . Hydrocodone-Acetaminophen Anxiety    Pt nervous and shaky per pt husband  . Penicillins Palpitations    Has patient had a PCN reaction causing immediate rash, facial/tongue/throat swelling, SOB or lightheadedness with hypotension: No Has patient had a PCN reaction causing severe rash involving mucus membranes or skin necrosis: No Has patient had a PCN reaction that required hospitalization No Has patient had a PCN reaction occurring within the last 10 years: No If all of the above answers are "NO", then may proceed with Cephalosporin use.   . Sulfa Antibiotics Rash    Antimicrobials this admission: 3/20 Cefepime >>  3/20 Vanc >>   Microbiology results: 3/20 BCx:   UCx:   Thank you for allowing pharmacy to be a part of this patient's care.  Sherlon Handing, PharmD, BCPS Please see amion for complete clinical pharmacist phone list 2019-04-01 1:11 AM

## 2019-04-05 NOTE — ED Notes (Signed)
MD Pollina at bedside for arterial line placement

## 2019-04-05 DEATH — deceased

## 2019-05-29 ENCOUNTER — Ambulatory Visit: Payer: Medicare Other | Admitting: Podiatry

## 2021-01-27 IMAGING — DX DG CHEST 1V PORT
1 series · 1 of 1 positions shown · non-contrast
Comparison: 08/12/2017

CLINICAL DATA: ET tube and OG tube placement

EXAM:
PORTABLE CHEST 1 VIEW

[chest]
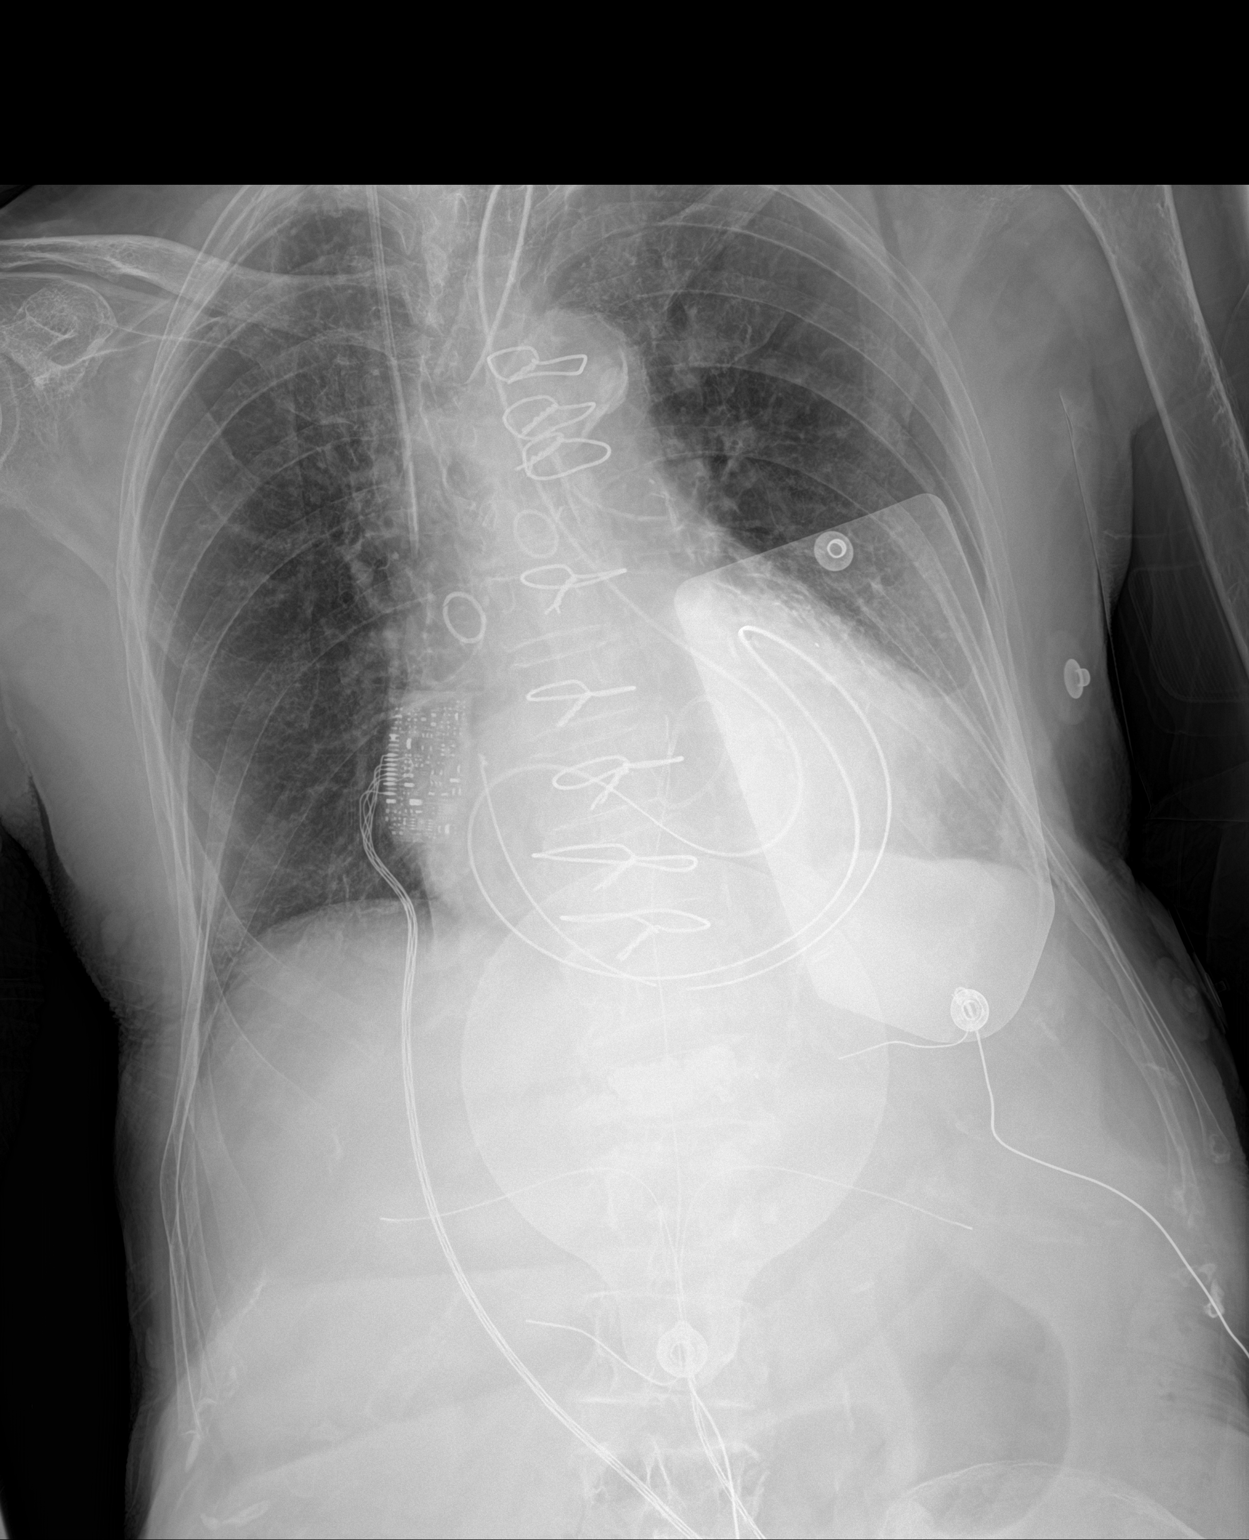

[1 of 1 positions shown; findings below may reference images not displayed]

FINDINGS: Right central line is in place with the tip in the SVC. Endotracheal
tube tip is 2.5 cm above the carina. NG tube coils in a large hiatal
hernia. Mild cardiomegaly. Prior CABG. No confluent opacities or
effusions. No acute bony abnormality.
IMPRESSION: Support devices as above.

Cardiomegaly.  Prior CABG.

Large hiatal hernia.

No acute cardiopulmonary disease.

## 2021-01-27 IMAGING — CT CT HEAD CODE STROKE
4 series · 16 of 47 positions shown, 18 images · non-contrast
Comparison: None.

CLINICAL DATA: Code stroke.  Fall

EXAM:
CT HEAD WITHOUT CONTRAST
TECHNIQUE: Contiguous axial images were obtained from the base of the skull
through the vertex without intravenous contrast.

[Series 3: head wo · axial · 0.42mm/px · z∈[+1122,+1242]mm · 7 of 33 slices shown, 9 images]
[im 5/33  brain]
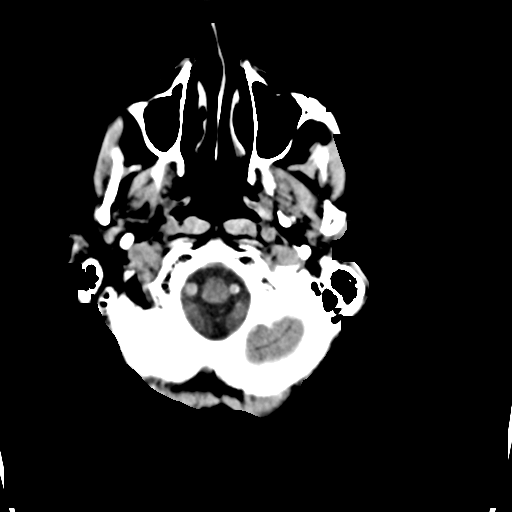
[im 5/33  bone]
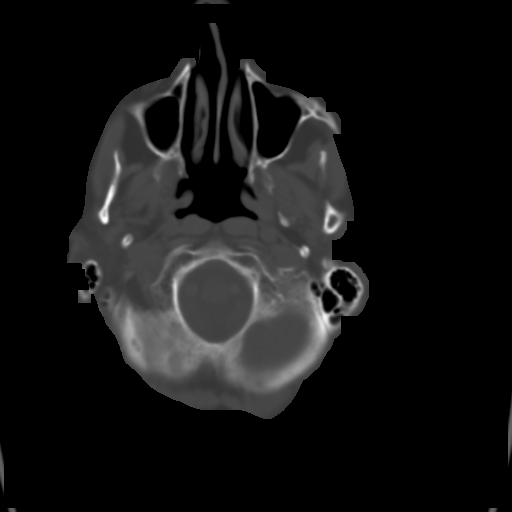
[im 9/33  brain]
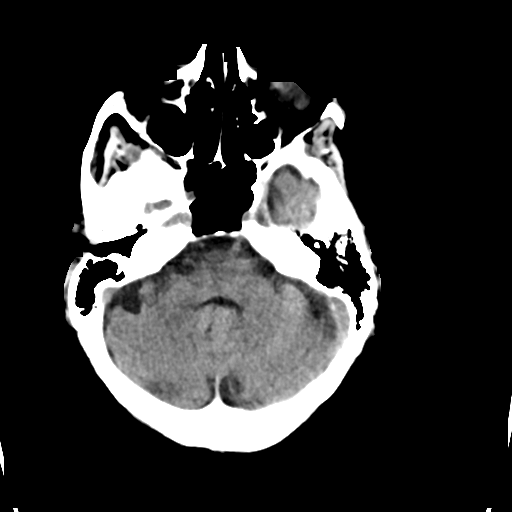
[im 13/33  brain]
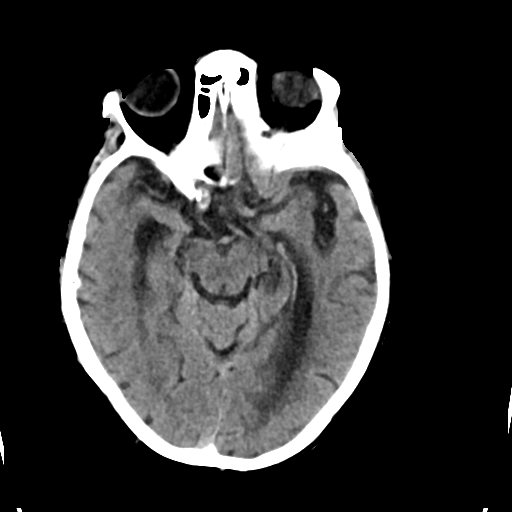
[im 17/33  brain]
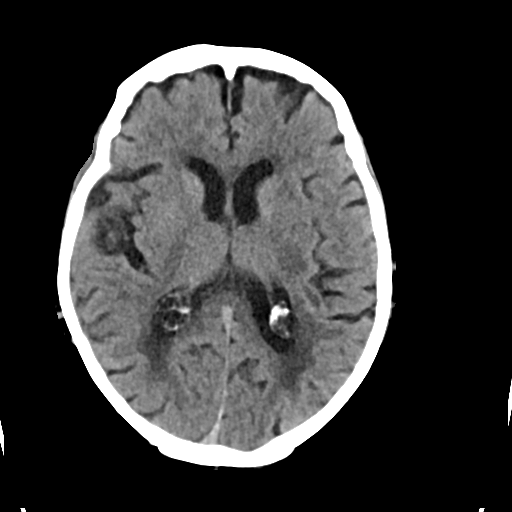
[im 21/33  brain]
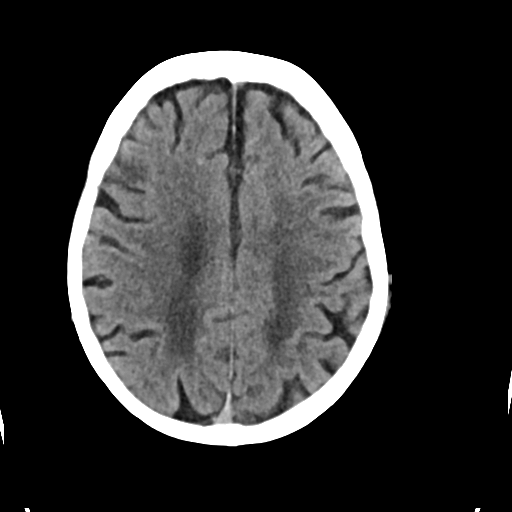
[im 21/33  bone]
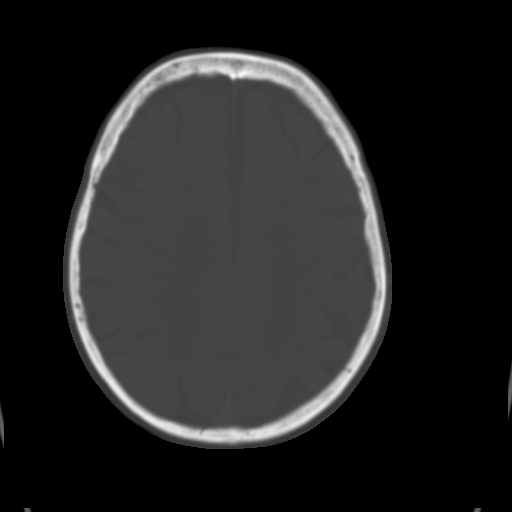
[im 25/33  brain]
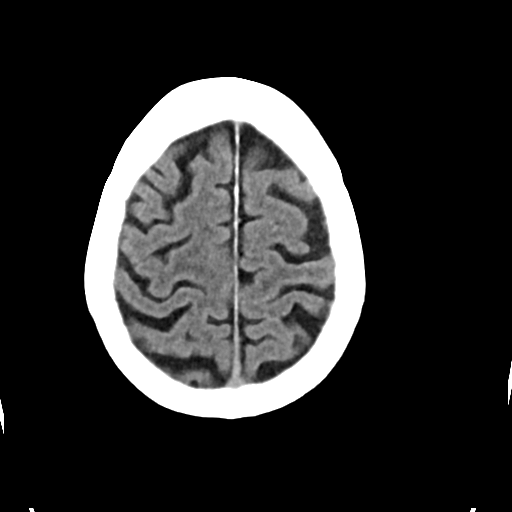
[im 29/33  brain]
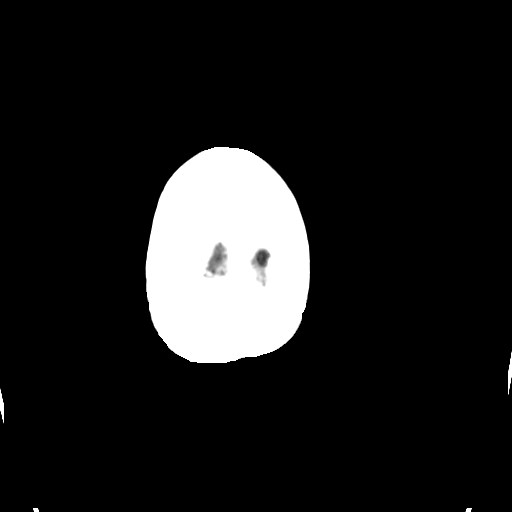

[Series 4: head bone · axial · 0.42mm/px · z∈[+1118,+1150]mm · 3 of 82 slices shown]
[im 9/82  bone]
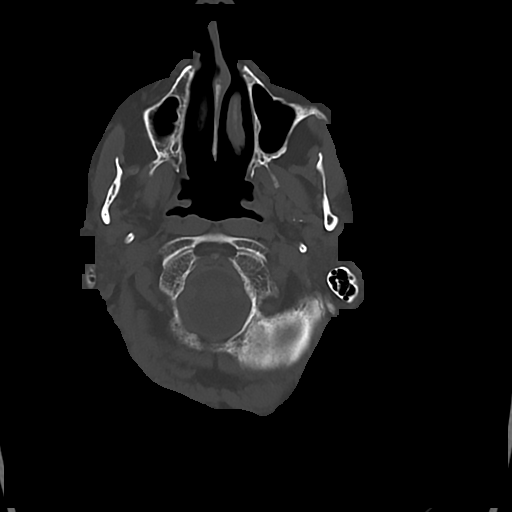
[im 17/82  bone]
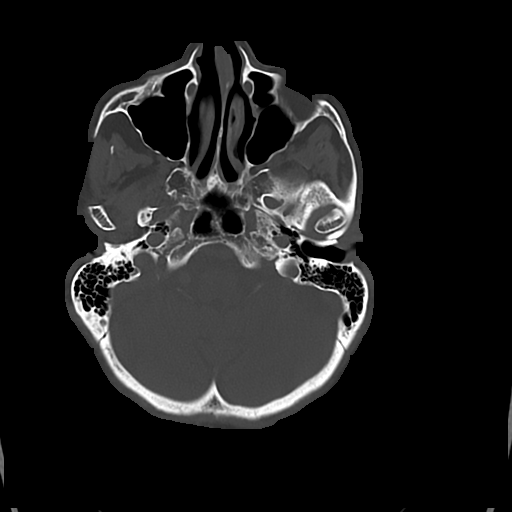
[im 25/82  bone]
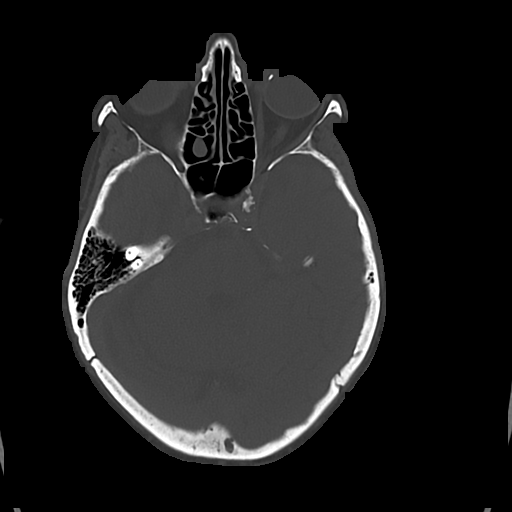

[Series 5: cor soft · coronal · 0.31mm/px · 3 of 65 slices shown]
[im 22/65  brain]
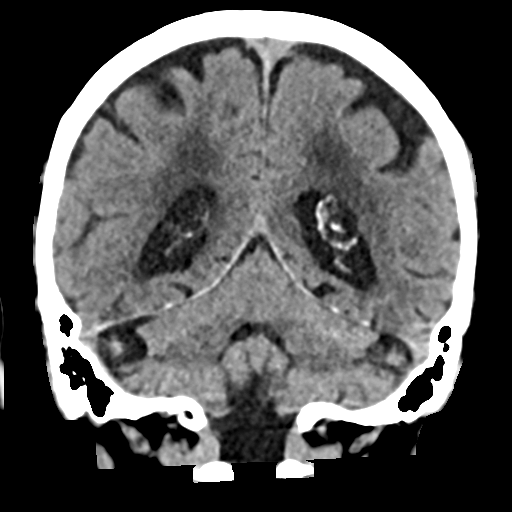
[im 29/65  brain]
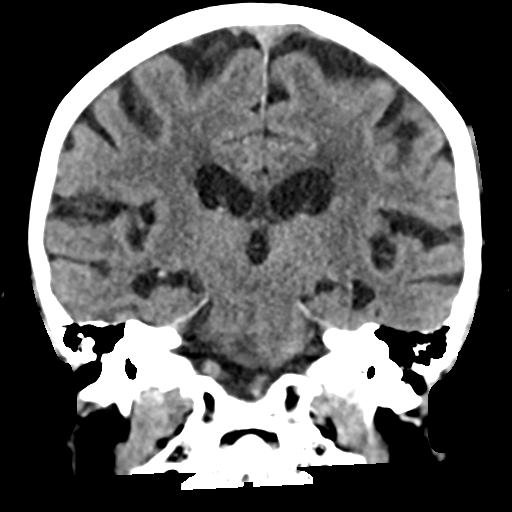
[im 36/65  brain]
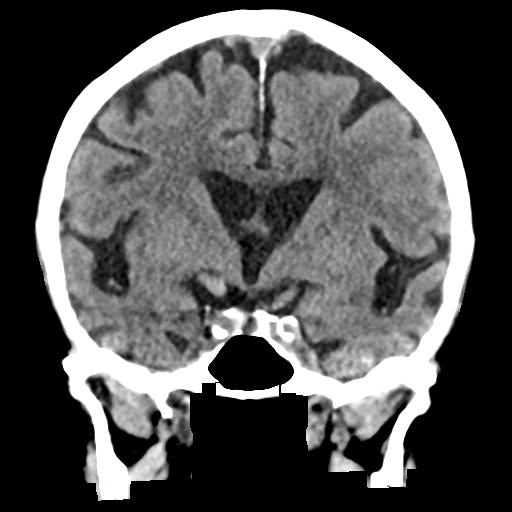

[Series 6: sag soft · sagittal · 0.30mm/px · 3 of 50 slices shown]
[im 17/50  brain]
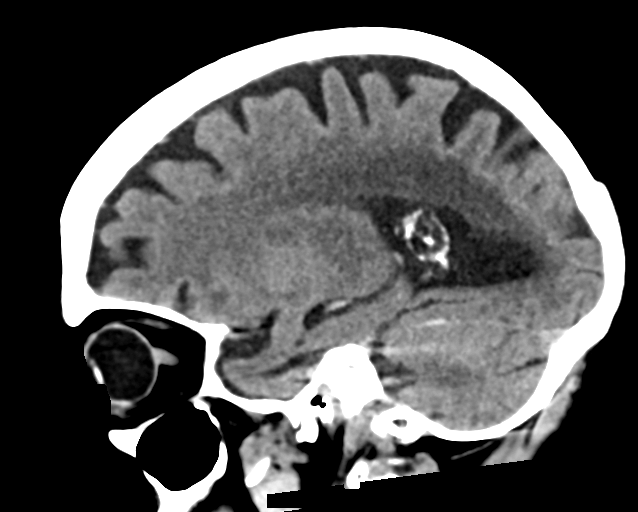
[im 25/50  brain]
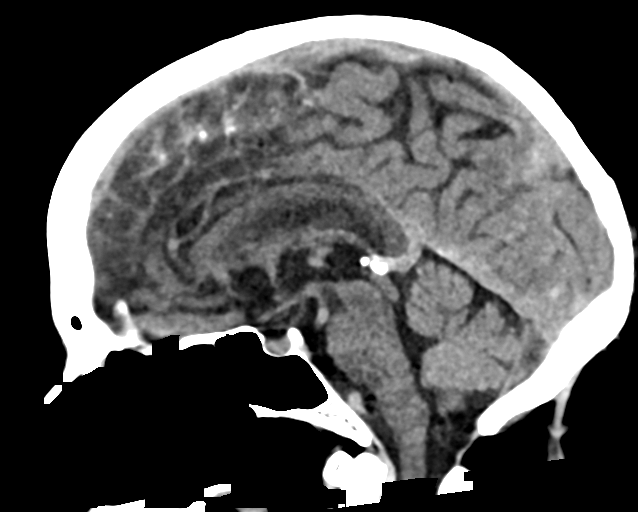
[im 33/50  brain]
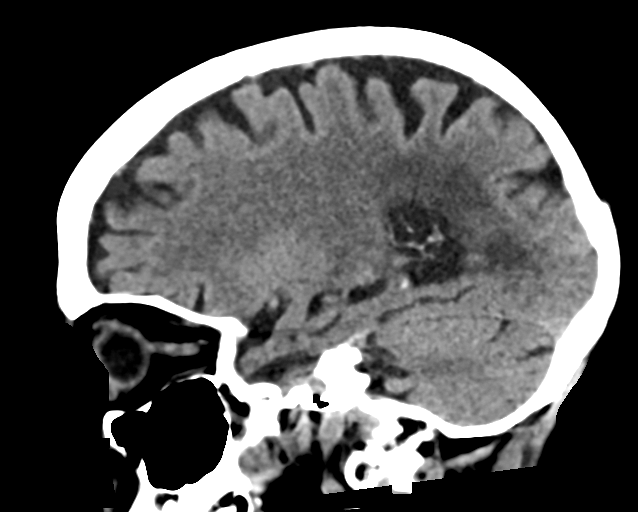

[16 of 47 positions shown; findings below may reference images not displayed]

FINDINGS: Brain: There is no mass, hemorrhage or extra-axial collection. The
size and configuration of the ventricles and extra-axial CSF spaces
are normal. There is hypoattenuation of the periventricular white
matter, most commonly indicating chronic ischemic microangiopathy.

Vascular: No abnormal hyperdensity of the major intracranial
arteries or dural venous sinuses. No intracranial atherosclerosis.

Skull: The visualized skull base, calvarium and extracranial soft
tissues are normal.

Sinuses/Orbits: No fluid levels or advanced mucosal thickening of
the visualized paranasal sinuses. No mastoid or middle ear effusion.
The orbits are normal.

ASPECTS (Alberta Stroke Program Early CT Score)

- Ganglionic level infarction (caudate, lentiform nuclei, internal
capsule, insula, M1-M3 cortex): 7

- Supraganglionic infarction (M4-M6 cortex): 3

Total score (0-10 with 10 being normal): 10
IMPRESSION: 1. No acute hemorrhage or mass lesion.
2. ASPECTS is 10.
* These results were communicated to Dr. Niecy Jatt at [DATE]
on 03/24/2019 by text page via the [employer] messaging system.
